# Patient Record
Sex: Female | Born: 1945
Health system: Southern US, Community
[De-identification: ages and names within clinical notes are randomized; demographics above are authoritative.]

## PROBLEM LIST (undated history)

## (undated) DIAGNOSIS — R51 Headache: Secondary | ICD-10-CM

## (undated) DIAGNOSIS — Z860100 Personal history of colon polyps, unspecified: Secondary | ICD-10-CM

## (undated) DIAGNOSIS — Z87898 Personal history of other specified conditions: Secondary | ICD-10-CM

## (undated) DIAGNOSIS — Z8709 Personal history of other diseases of the respiratory system: Secondary | ICD-10-CM

## (undated) DIAGNOSIS — M329 Systemic lupus erythematosus, unspecified: Secondary | ICD-10-CM

## (undated) DIAGNOSIS — R918 Other nonspecific abnormal finding of lung field: Secondary | ICD-10-CM

## (undated) DIAGNOSIS — M1990 Unspecified osteoarthritis, unspecified site: Secondary | ICD-10-CM

## (undated) DIAGNOSIS — C801 Malignant (primary) neoplasm, unspecified: Secondary | ICD-10-CM

## (undated) DIAGNOSIS — I10 Essential (primary) hypertension: Secondary | ICD-10-CM

## (undated) DIAGNOSIS — F419 Anxiety disorder, unspecified: Secondary | ICD-10-CM

## (undated) DIAGNOSIS — J302 Other seasonal allergic rhinitis: Secondary | ICD-10-CM

## (undated) DIAGNOSIS — Z9289 Personal history of other medical treatment: Secondary | ICD-10-CM

## (undated) DIAGNOSIS — K274 Chronic or unspecified peptic ulcer, site unspecified, with hemorrhage: Secondary | ICD-10-CM

## (undated) DIAGNOSIS — M797 Fibromyalgia: Secondary | ICD-10-CM

## (undated) DIAGNOSIS — Z87442 Personal history of urinary calculi: Secondary | ICD-10-CM

## (undated) DIAGNOSIS — IMO0002 Reserved for concepts with insufficient information to code with codable children: Secondary | ICD-10-CM

## (undated) DIAGNOSIS — S069X9A Unspecified intracranial injury with loss of consciousness of unspecified duration, initial encounter: Secondary | ICD-10-CM

## (undated) DIAGNOSIS — F329 Major depressive disorder, single episode, unspecified: Secondary | ICD-10-CM

## (undated) DIAGNOSIS — I2699 Other pulmonary embolism without acute cor pulmonale: Secondary | ICD-10-CM

## (undated) DIAGNOSIS — J189 Pneumonia, unspecified organism: Secondary | ICD-10-CM

## (undated) DIAGNOSIS — M2550 Pain in unspecified joint: Secondary | ICD-10-CM

## (undated) DIAGNOSIS — E785 Hyperlipidemia, unspecified: Secondary | ICD-10-CM

## (undated) DIAGNOSIS — F32A Depression, unspecified: Secondary | ICD-10-CM

## (undated) DIAGNOSIS — R42 Dizziness and giddiness: Secondary | ICD-10-CM

## (undated) DIAGNOSIS — J449 Chronic obstructive pulmonary disease, unspecified: Secondary | ICD-10-CM

## (undated) DIAGNOSIS — F4310 Post-traumatic stress disorder, unspecified: Secondary | ICD-10-CM

## (undated) DIAGNOSIS — C50919 Malignant neoplasm of unspecified site of unspecified female breast: Secondary | ICD-10-CM

## (undated) HISTORY — PX: POLYPECTOMY: SHX149

## (undated) HISTORY — PX: CYSTOSCOPY: SUR368

## (undated) HISTORY — PX: KNEE ARTHROSCOPY: SUR90

## (undated) HISTORY — PX: OTHER SURGICAL HISTORY: SHX169

## (undated) HISTORY — PX: BREAST LUMPECTOMY: SHX2

## (undated) HISTORY — PX: BREAST SURGERY: SHX581

## (undated) HISTORY — PX: INCISIONAL HERNIA REPAIR: SHX193

## (undated) HISTORY — PX: BACK SURGERY: SHX140

## (undated) HISTORY — PX: JOINT REPLACEMENT: SHX530

## (undated) HISTORY — PX: BREAST EXCISIONAL BIOPSY: SUR124

## (undated) HISTORY — PX: COLONOSCOPY: SHX5424

## (undated) HISTORY — PX: HERNIA REPAIR: SHX51

## (undated) HISTORY — PX: COLONOSCOPY: SHX174

## (undated) HISTORY — PX: DILATION AND CURETTAGE OF UTERUS: SHX78

---
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
## 1969-02-05 HISTORY — PX: URETHRAL DILATION: SUR417

## 1972-02-06 DIAGNOSIS — K284 Chronic or unspecified gastrojejunal ulcer with hemorrhage: Secondary | ICD-10-CM

## 1972-02-06 DIAGNOSIS — K274 Chronic or unspecified peptic ulcer, site unspecified, with hemorrhage: Secondary | ICD-10-CM

## 1972-02-06 HISTORY — DX: Chronic or unspecified peptic ulcer, site unspecified, with hemorrhage: K27.4

## 1972-02-06 HISTORY — DX: Chronic or unspecified gastrojejunal ulcer with hemorrhage: K28.4

## 1988-02-06 HISTORY — PX: MULTIPLE TOOTH EXTRACTIONS: SHX2053

## 1990-02-05 HISTORY — PX: ABDOMINAL HYSTERECTOMY: SHX81

## 1990-02-05 HISTORY — PX: CARDIAC CATHETERIZATION: SHX172

## 1992-02-06 HISTORY — PX: CHOLECYSTECTOMY: SHX55

## 2004-02-06 DIAGNOSIS — I2699 Other pulmonary embolism without acute cor pulmonale: Secondary | ICD-10-CM

## 2004-02-06 HISTORY — DX: Other pulmonary embolism without acute cor pulmonale: I26.99

## 2004-02-06 HISTORY — PX: VENA CAVA FILTER PLACEMENT: SUR1032

## 2011-05-07 DIAGNOSIS — D3A090 Benign carcinoid tumor of the bronchus and lung: Secondary | ICD-10-CM | POA: Insufficient documentation

## 2011-05-07 DIAGNOSIS — R918 Other nonspecific abnormal finding of lung field: Secondary | ICD-10-CM

## 2011-05-07 HISTORY — DX: Other nonspecific abnormal finding of lung field: R91.8

## 2011-05-18 ENCOUNTER — Encounter (HOSPITAL_COMMUNITY): Payer: Self-pay

## 2011-05-28 ENCOUNTER — Encounter (HOSPITAL_COMMUNITY): Payer: Self-pay

## 2011-05-28 ENCOUNTER — Encounter (HOSPITAL_COMMUNITY)
Admission: RE | Admit: 2011-05-28 | Discharge: 2011-05-28 | Disposition: A | Discharge status: Home / Self Care | Payer: Medicare Other | Source: Ambulatory Visit | Attending: Orthopedic Surgery | Admitting: Orthopedic Surgery

## 2011-05-28 ENCOUNTER — Encounter (HOSPITAL_COMMUNITY)
Admission: RE | Admit: 2011-05-28 | Discharge: 2011-05-28 | Disposition: A | Discharge status: Home / Self Care | Payer: Medicare Other | Source: Ambulatory Visit | Attending: Anesthesiology | Admitting: Anesthesiology

## 2011-05-28 ENCOUNTER — Other Ambulatory Visit: Payer: Self-pay | Admitting: Orthopedic Surgery

## 2011-05-28 HISTORY — DX: Fibromyalgia: M79.7

## 2011-05-28 HISTORY — DX: Headache: R51

## 2011-05-28 HISTORY — DX: Essential (primary) hypertension: I10

## 2011-05-28 HISTORY — DX: Pneumonia, unspecified organism: J18.9

## 2011-05-28 HISTORY — DX: Other seasonal allergic rhinitis: J30.2

## 2011-05-28 HISTORY — DX: Other pulmonary embolism without acute cor pulmonale: I26.99

## 2011-05-28 HISTORY — DX: Malignant (primary) neoplasm, unspecified: C80.1

## 2011-05-28 LAB — APTT: aPTT: 30 seconds (ref 24–37)

## 2011-05-28 LAB — BASIC METABOLIC PANEL
Calcium: 9.5 mg/dL (ref 8.4–10.5)
Creatinine, Ser: 0.5 mg/dL (ref 0.50–1.10)
GFR calc Af Amer: >90 mL/min (ref >90)
GFR calc non Af Amer: >90 mL/min (ref >90)
Sodium: 138 mEq/L (ref 135–145)

## 2011-05-28 LAB — CBC
Platelets: 279 10*3/uL (ref 150–400)
RBC: 4.82 MIL/uL (ref 3.87–5.11)
RDW: 12.7 % (ref 11.5–15.5)
WBC: 10.5 10*3/uL (ref 4.0–10.5)

## 2011-05-28 LAB — TYPE AND SCREEN
ABO/RH(D): O POS
Antibody Screen: NEGATIVE

## 2011-05-28 LAB — PROTIME-INR: Prothrombin Time: 13.2 seconds (ref 11.6–15.2)

## 2011-05-28 NOTE — Pre-Procedure Instructions (Addendum)
20 Breanna Dixon  05/28/2011   Your procedure is scheduled on:  Mon, April 29 @ 1000 am  Report to [facility] at 0800 AM.  Call this number if you have problems the morning of surgery: [phone]   Remember:   Do not eat food:After Midnight.  May have clear liquids: up to 4 Hours before arrival.(until 4:00 am)  Clear liquids include soda, tea, black coffee, apple or grape juice, broth,water  Take these medicines the morning of surgery with A SIP OF WATER: Cymbalta,Pain Pill(if needed),Ativan,Metoprolol,Lyrica,Phenergan(if needed)   Do not wear jewelry, make-up or nail polish.  Do not wear lotions, powders, or perfumes.   Do not shave 48 hours prior to surgery.  Do not bring valuables to the hospital.  Contacts, dentures or bridgework may not be worn into surgery.  Leave suitcase in the car. After surgery it may be brought to your room.  For patients admitted to the hospital, checkout time is 11:00 AM the day of discharge.   Special Instructions: CHG Shower Use Special Wash: 1/2 bottle night before surgery and 1/2 bottle morning of surgery.   Please read over the following fact sheets that you were given: Pain Booklet, Coughing and Deep Breathing, Blood Transfusion Information, Total Joint Packet, MRSA Information and Surgical Site Infection Prevention

## 2011-05-28 NOTE — Progress Notes (Signed)
Patient had stress test and echo in april 2013 with [facility] cardiology - request for records sent. Medical doctor - Dr. Letta Kocher

## 2011-05-29 ENCOUNTER — Inpatient Hospital Stay: Admission: RE | Admit: 2011-05-29 | Payer: Medicare Other | Source: Ambulatory Visit

## 2011-05-29 ENCOUNTER — Other Ambulatory Visit: Payer: Self-pay | Admitting: Orthopedic Surgery

## 2011-05-29 DIAGNOSIS — R911 Solitary pulmonary nodule: Secondary | ICD-10-CM

## 2011-05-29 NOTE — Consult Note (Addendum)
Anesthesia Chart Review:  Patient is a 66 year old female posted for a right TKA on 06/04/11.  (Orders are still pending.) History includes smoking, HTN, gastric ulcer, fibromyalgia, headaches, tachycardia, breast cancer, PTSD, PE '06.  Anesthesia history reports she woke up during a previous surgery.  Currently I have no further details, and there is no notation that Anesthesia was asked to evaluate patient during her PAT visit.  She was recently evaluated by Cardiologist Dr. Tereso Newcomer with [facility] on 03/28/11 for chest pain and preoperative evaluation.  Nuclear stress test on 04/16/11 showed no evidence of ischemia, normal LV function with EF 89%.    Echo on 04/16/11 showed normal LV systolic function, EF 75%, mild MR/TR with normal pulmonary pressures, mild-moderate AR.    EKG on 05/28/11 showed NSR.  CXR on 05/28/11 showed 1. Question right lower lobe pulmonary nodule. Recommend CT chest  with contrast for further evaluation to exclude neoplasm.  Labs were done per Anesthesia, since orders were not available from the surgeon during patient's PAT visit.    I called her CXR results to Keri at Dr. [name] office.  Dr. Sherlean Foot plans to order a chest CT pre-operatively.  She is also aware any additional labs ordered by the surgeon will be done on arrival.  I have left a message with Ms. [place] to call me to discuss her documented anesthesia complication(s).  If I do not hear back from her, this will have to be discussed further on the day of surgery.    Addendum: 05/30/11 1645  I spoke with patient earlier today.  She reports that she had retained stones following her lap chole.  She then underwent what sounds like an ERCP for stone removal (that was not done under GA) and she "woke up" during this procedure and had to be given additional medication.  She does remember once being aware of the ETT being removed right after surgery, but otherwise has not had any known  complication.  She did under a chest CT scan today for an abnormal CXR.  This showed: "17 x 14 x 16 mm well marginated mass in the right lower lobe centrally. This could represent a lung carcinoma. However, well marginated character raises the possibility of hamartoma, a venous varix or bronchial atresia. Are there old studies at any other location? If this can be defined as chronic, it could be followed. If no prior studies exist, this abnormality is large enough that PET scan should be reliable for evaluation. Evidence of previous granulomatous infection in the left lower lobe with calcified lymph nodes in the left hilum, mediastinum and in the celiac axis region. This is obviously old and inactive disease."  When I was last updated by Keri at Dr. [name] office, he was planning to discuss these findings with a Pulmonologist to determine if further work-up should be pursued preoperatively and then proceed accordingly.   Addendum: 06/01/11 1500  Ms. [place] was seen by Dr. Erenest Blank of [facility] on 06/01/11.  He favors that her lung lesion is non-malignant, but regardless will need further evaluation.  He feels this could be done pre- or post- operatively depending on patient or surgeon preference.  He did go ahead and order a PET scan.  Spirometry today at his office showed mild reduction.  He feels she is at mild increased risk for perioperative respiratory problems due to her COPD, but not prohibitive.  (See attached office note for complete details.)  Shonna Chock, PA-C

## 2011-05-30 ENCOUNTER — Ambulatory Visit
Admission: RE | Admit: 2011-05-30 | Discharge: 2011-05-30 | Disposition: A | Discharge status: Home / Self Care | Payer: Medicare Other | Source: Ambulatory Visit | Attending: Orthopedic Surgery | Admitting: Orthopedic Surgery

## 2011-05-30 DIAGNOSIS — R911 Solitary pulmonary nodule: Secondary | ICD-10-CM

## 2011-05-31 ENCOUNTER — Other Ambulatory Visit: Payer: Self-pay | Admitting: Orthopedic Surgery

## 2011-05-31 ENCOUNTER — Inpatient Hospital Stay: Admission: RE | Admit: 2011-05-31 | Payer: Medicare Other | Source: Ambulatory Visit

## 2011-05-31 NOTE — Progress Notes (Signed)
REQUESTED  ORDERS DR LUCEY.

## 2011-05-31 NOTE — Progress Notes (Signed)
1425.[name]..Marland KitchenORDERS NOW IN CHART FROM [place], [place]  FOR  DR.  Sherlean Foot.[name]

## 2011-06-03 MED ORDER — CHLORHEXIDINE GLUCONATE 4 % EX LIQD: 60.0000 mL | Freq: Once | CUTANEOUS | Status: DC

## 2011-06-03 MED ORDER — CEFAZOLIN SODIUM-DEXTROSE 2-3 GM-% IV SOLR
2.0000 g | INTRAVENOUS | Status: AC
  Administered 2011-06-04: 2 g via INTRAVENOUS
  Filled 2011-06-03: qty 50

## 2011-06-04 ENCOUNTER — Ambulatory Visit (HOSPITAL_COMMUNITY): Payer: Medicare Other | Admitting: Vascular Surgery

## 2011-06-04 ENCOUNTER — Encounter (HOSPITAL_COMMUNITY): Payer: Self-pay | Admitting: Vascular Surgery

## 2011-06-04 ENCOUNTER — Encounter (HOSPITAL_COMMUNITY): Payer: Self-pay | Admitting: Surgery

## 2011-06-04 ENCOUNTER — Inpatient Hospital Stay (HOSPITAL_COMMUNITY)
Admission: RE | Admit: 2011-06-04 | Discharge: 2011-06-07 | DRG: 470 | Disposition: A | Discharge status: Home Health | Payer: Medicare Other | Source: Ambulatory Visit | Attending: Orthopedic Surgery | Admitting: Orthopedic Surgery

## 2011-06-04 ENCOUNTER — Encounter (HOSPITAL_COMMUNITY)
Admission: RE | Disposition: A | Discharge status: Home Health | Payer: Self-pay | Source: Ambulatory Visit | Attending: Orthopedic Surgery

## 2011-06-04 DIAGNOSIS — Z01812 Encounter for preprocedural laboratory examination: Secondary | ICD-10-CM

## 2011-06-04 DIAGNOSIS — M1710 Unilateral primary osteoarthritis, unspecified knee: Principal | ICD-10-CM | POA: Diagnosis present

## 2011-06-04 DIAGNOSIS — M1711 Unilateral primary osteoarthritis, right knee: Secondary | ICD-10-CM

## 2011-06-04 DIAGNOSIS — D62 Acute posthemorrhagic anemia: Secondary | ICD-10-CM | POA: Diagnosis not present

## 2011-06-04 DIAGNOSIS — F17200 Nicotine dependence, unspecified, uncomplicated: Secondary | ICD-10-CM | POA: Diagnosis present

## 2011-06-04 DIAGNOSIS — I10 Essential (primary) hypertension: Secondary | ICD-10-CM | POA: Diagnosis present

## 2011-06-04 HISTORY — PX: TOTAL KNEE ARTHROPLASTY: SHX125

## 2011-06-04 HISTORY — DX: Other nonspecific abnormal finding of lung field: R91.8

## 2011-06-04 SURGERY — ARTHROPLASTY, KNEE, TOTAL
Anesthesia: General | Site: Knee | Laterality: Right | Wound class: Clean

## 2011-06-04 MED ORDER — FENTANYL CITRATE 0.05 MG/ML IJ SOLN
INTRAMUSCULAR | Status: DC | PRN
Start: 2011-06-04 — End: 2011-06-04
  Administered 2011-06-04: 150 ug via INTRAVENOUS

## 2011-06-04 MED ORDER — ENOXAPARIN SODIUM 30 MG/0.3ML ~~LOC~~ SOLN
30.0000 mg | Freq: Two times a day (BID) | SUBCUTANEOUS | Status: DC
  Administered 2011-06-05 – 2011-06-07 (×5): 30 mg via SUBCUTANEOUS
  Filled 2011-06-04 (×7): qty 0.3

## 2011-06-04 MED ORDER — SODIUM CHLORIDE 0.9 % IV SOLN
INTRAVENOUS | Status: DC
  Administered 2011-06-05: 09:00:00 via INTRAVENOUS

## 2011-06-04 MED ORDER — ALUM & MAG HYDROXIDE-SIMETH 200-200-20 MG/5ML PO SUSP: 30.0000 mL | ORAL | Status: DC | PRN

## 2011-06-04 MED ORDER — BUPIVACAINE 0.25 % ON-Q PUMP SINGLE CATH 300ML
INJECTION | Status: DC | PRN
  Administered 2011-06-04: 300 mL

## 2011-06-04 MED ORDER — ACETAMINOPHEN 10 MG/ML IV SOLN
1000.0000 mg | Freq: Four times a day (QID) | INTRAVENOUS | Status: DC
  Administered 2011-06-04: 1000 mg via INTRAVENOUS
  Filled 2011-06-04 (×3): qty 100

## 2011-06-04 MED ORDER — MENTHOL 3 MG MT LOZG: 1.0000 | LOZENGE | OROMUCOSAL | Status: DC | PRN

## 2011-06-04 MED ORDER — FENTANYL CITRATE 0.05 MG/ML IJ SOLN
INTRAMUSCULAR | Status: AC
  Filled 2011-06-04: qty 2

## 2011-06-04 MED ORDER — FLEET ENEMA 7-19 GM/118ML RE ENEM: 1.0000 | ENEMA | Freq: Once | RECTAL | Status: AC | PRN

## 2011-06-04 MED ORDER — ONDANSETRON HCL 4 MG/2ML IJ SOLN
INTRAMUSCULAR | Status: DC | PRN
  Administered 2011-06-04: 4 mg via INTRAVENOUS

## 2011-06-04 MED ORDER — CARBAMAZEPINE ER 200 MG PO TB12
200.0000 mg | ORAL_TABLET | Freq: Every day | ORAL | Status: DC
  Administered 2011-06-05 – 2011-06-07 (×3): 200 mg via ORAL
  Filled 2011-06-04 (×3): qty 1

## 2011-06-04 MED ORDER — FLUTICASONE PROPIONATE 50 MCG/ACT NA SUSP
2.0000 | Freq: Every day | NASAL | Status: DC
  Administered 2011-06-05 – 2011-06-07 (×3): 2 via NASAL
  Filled 2011-06-04: qty 16

## 2011-06-04 MED ORDER — DIPHENHYDRAMINE HCL 12.5 MG/5ML PO ELIX: 12.5000 mg | ORAL_SOLUTION | ORAL | Status: DC | PRN

## 2011-06-04 MED ORDER — BUPIVACAINE-EPINEPHRINE PF 0.5-1:200000 % IJ SOLN
INTRAMUSCULAR | Status: DC | PRN
  Administered 2011-06-04: 30 mL

## 2011-06-04 MED ORDER — METHOCARBAMOL 100 MG/ML IJ SOLN
500.0000 mg | Freq: Four times a day (QID) | INTRAVENOUS | Status: DC | PRN
  Filled 2011-06-04 (×2): qty 5

## 2011-06-04 MED ORDER — GLYCOPYRROLATE 0.2 MG/ML IJ SOLN
INTRAMUSCULAR | Status: DC | PRN
  Administered 2011-06-04: .6 mg via INTRAVENOUS

## 2011-06-04 MED ORDER — HYDROMORPHONE HCL PF 1 MG/ML IJ SOLN
0.5000 mg | INTRAMUSCULAR | Status: DC | PRN
  Administered 2011-06-04 – 2011-06-06 (×7): 1 mg via INTRAVENOUS
  Filled 2011-06-04 (×6): qty 1

## 2011-06-04 MED ORDER — MIDAZOLAM HCL 2 MG/2ML IJ SOLN
INTRAMUSCULAR | Status: AC
  Filled 2011-06-04: qty 2

## 2011-06-04 MED ORDER — BUPIVACAINE ON-Q PAIN PUMP (FOR ORDER SET NO CHG)
INJECTION | Status: AC
  Filled 2011-06-04: qty 1

## 2011-06-04 MED ORDER — HYDROMORPHONE HCL PF 1 MG/ML IJ SOLN
INTRAMUSCULAR | Status: AC
  Filled 2011-06-04: qty 1

## 2011-06-04 MED ORDER — VERAPAMIL HCL ER 240 MG PO TBCR
240.0000 mg | EXTENDED_RELEASE_TABLET | Freq: Every day | ORAL | Status: DC
  Administered 2011-06-04 – 2011-06-06 (×3): 240 mg via ORAL
  Filled 2011-06-04 (×4): qty 1

## 2011-06-04 MED ORDER — CELECOXIB 200 MG PO CAPS
200.0000 mg | ORAL_CAPSULE | Freq: Two times a day (BID) | ORAL | Status: DC
  Administered 2011-06-04 – 2011-06-07 (×6): 200 mg via ORAL
  Filled 2011-06-04 (×7): qty 1

## 2011-06-04 MED ORDER — ROCURONIUM BROMIDE 100 MG/10ML IV SOLN
INTRAVENOUS | Status: DC | PRN
  Administered 2011-06-04: 50 mg via INTRAVENOUS

## 2011-06-04 MED ORDER — IPRATROPIUM-ALBUTEROL 18-103 MCG/ACT IN AERO: 2.0000 | INHALATION_SPRAY | Freq: Four times a day (QID) | RESPIRATORY_TRACT | Status: DC | PRN

## 2011-06-04 MED ORDER — PREGABALIN 75 MG PO CAPS
75.0000 mg | ORAL_CAPSULE | Freq: Two times a day (BID) | ORAL | Status: DC
  Administered 2011-06-04 – 2011-06-07 (×6): 75 mg via ORAL
  Filled 2011-06-04 (×6): qty 1

## 2011-06-04 MED ORDER — METOCLOPRAMIDE HCL 10 MG PO TABS: 5.0000 mg | ORAL_TABLET | Freq: Three times a day (TID) | ORAL | Status: DC | PRN

## 2011-06-04 MED ORDER — METHOCARBAMOL 500 MG PO TABS
500.0000 mg | ORAL_TABLET | Freq: Four times a day (QID) | ORAL | Status: DC | PRN
  Administered 2011-06-04 – 2011-06-06 (×5): 500 mg via ORAL
  Filled 2011-06-04 (×6): qty 1

## 2011-06-04 MED ORDER — ACETAMINOPHEN 325 MG PO TABS: 650.0000 mg | ORAL_TABLET | Freq: Four times a day (QID) | ORAL | Status: DC | PRN

## 2011-06-04 MED ORDER — MIDAZOLAM HCL 2 MG/2ML IJ SOLN
2.0000 mg | INTRAMUSCULAR | Status: DC | PRN
  Administered 2011-06-04: 2 mg via INTRAVENOUS

## 2011-06-04 MED ORDER — METOCLOPRAMIDE HCL 5 MG/ML IJ SOLN: 5.0000 mg | Freq: Three times a day (TID) | INTRAMUSCULAR | Status: DC | PRN

## 2011-06-04 MED ORDER — PHENOL 1.4 % MT LIQD: 1.0000 | OROMUCOSAL | Status: DC | PRN

## 2011-06-04 MED ORDER — OXYCODONE HCL 10 MG PO TB12
10.0000 mg | ORAL_TABLET | Freq: Two times a day (BID) | ORAL | Status: DC
  Administered 2011-06-04 (×2): 10 mg via ORAL
  Filled 2011-06-04 (×2): qty 1

## 2011-06-04 MED ORDER — CARBAMAZEPINE ER 400 MG PO TB12
400.0000 mg | ORAL_TABLET | Freq: Every day | ORAL | Status: DC
  Administered 2011-06-04 – 2011-06-06 (×3): 400 mg via ORAL
  Filled 2011-06-04 (×4): qty 1

## 2011-06-04 MED ORDER — VERAPAMIL HCL 240 MG (CO) PO TB24: 240.0000 mg | ORAL_TABLET | Freq: Every day | ORAL | Status: DC

## 2011-06-04 MED ORDER — BUPIVACAINE 0.25 % ON-Q PUMP SINGLE CATH 300ML
300.0000 mL | INJECTION | Status: DC
  Filled 2011-06-04: qty 300

## 2011-06-04 MED ORDER — DOCUSATE SODIUM 100 MG PO CAPS
100.0000 mg | ORAL_CAPSULE | Freq: Two times a day (BID) | ORAL | Status: DC
  Administered 2011-06-04 – 2011-06-07 (×7): 100 mg via ORAL
  Filled 2011-06-04 (×8): qty 1

## 2011-06-04 MED ORDER — SODIUM CHLORIDE 0.9 % IR SOLN
Status: DC | PRN
  Administered 2011-06-04: 3000 mL

## 2011-06-04 MED ORDER — BUPIVACAINE-EPINEPHRINE 0.25% -1:200000 IJ SOLN
INTRAMUSCULAR | Status: DC | PRN
  Administered 2011-06-04: 20 mL

## 2011-06-04 MED ORDER — ACETAMINOPHEN 650 MG RE SUPP: 650.0000 mg | Freq: Four times a day (QID) | RECTAL | Status: DC | PRN

## 2011-06-04 MED ORDER — BISACODYL 5 MG PO TBEC
5.0000 mg | DELAYED_RELEASE_TABLET | Freq: Every day | ORAL | Status: DC | PRN
  Administered 2011-06-07: 5 mg via ORAL
  Filled 2011-06-04: qty 1

## 2011-06-04 MED ORDER — ACETAMINOPHEN 10 MG/ML IV SOLN
1000.0000 mg | Freq: Once | INTRAVENOUS | Status: DC
  Filled 2011-06-04: qty 100

## 2011-06-04 MED ORDER — DULOXETINE HCL 60 MG PO CPEP
90.0000 mg | ORAL_CAPSULE | Freq: Every day | ORAL | Status: DC
  Administered 2011-06-05 – 2011-06-07 (×3): 90 mg via ORAL
  Filled 2011-06-04 (×4): qty 1

## 2011-06-04 MED ORDER — CARBAMAZEPINE ER 200 MG PO TB12: 200.0000 mg | ORAL_TABLET | Freq: Two times a day (BID) | ORAL | Status: DC

## 2011-06-04 MED ORDER — FENTANYL CITRATE 0.05 MG/ML IJ SOLN
100.0000 ug | INTRAMUSCULAR | Status: DC | PRN
  Administered 2011-06-04: 100 ug via INTRAVENOUS

## 2011-06-04 MED ORDER — METOPROLOL SUCCINATE ER 100 MG PO TB24
200.0000 mg | ORAL_TABLET | ORAL | Status: DC
  Administered 2011-06-05 – 2011-06-07 (×3): 200 mg via ORAL
  Filled 2011-06-04 (×4): qty 2

## 2011-06-04 MED ORDER — NEOSTIGMINE METHYLSULFATE 1 MG/ML IJ SOLN
INTRAMUSCULAR | Status: DC | PRN
  Administered 2011-06-04: 4 mg via INTRAVENOUS

## 2011-06-04 MED ORDER — FENOFIBRATE 54 MG PO TABS
54.0000 mg | ORAL_TABLET | Freq: Every day | ORAL | Status: DC
  Administered 2011-06-05 – 2011-06-07 (×3): 54 mg via ORAL
  Filled 2011-06-04 (×4): qty 1

## 2011-06-04 MED ORDER — LORAZEPAM 0.5 MG PO TABS
0.5000 mg | ORAL_TABLET | ORAL | Status: DC | PRN
  Administered 2011-06-04 – 2011-06-05 (×2): 0.5 mg via ORAL
  Filled 2011-06-04: qty 1
  Filled 2011-06-04: qty 2
  Filled 2011-06-04: qty 1

## 2011-06-04 MED ORDER — ONDANSETRON HCL 4 MG/2ML IJ SOLN
4.0000 mg | Freq: Four times a day (QID) | INTRAMUSCULAR | Status: DC | PRN
  Administered 2011-06-05: 4 mg via INTRAVENOUS
  Filled 2011-06-04: qty 2

## 2011-06-04 MED ORDER — ONDANSETRON HCL 4 MG PO TABS
4.0000 mg | ORAL_TABLET | Freq: Four times a day (QID) | ORAL | Status: DC | PRN
  Administered 2011-06-05 – 2011-06-07 (×3): 4 mg via ORAL
  Filled 2011-06-04 (×3): qty 1

## 2011-06-04 MED ORDER — SODIUM CHLORIDE 0.9 % IV SOLN: INTRAVENOUS | Status: DC

## 2011-06-04 MED ORDER — ZOLPIDEM TARTRATE 5 MG PO TABS
5.0000 mg | ORAL_TABLET | Freq: Every evening | ORAL | Status: DC | PRN
  Administered 2011-06-06: 5 mg via ORAL
  Filled 2011-06-04: qty 1

## 2011-06-04 MED ORDER — HYDROMORPHONE HCL PF 1 MG/ML IJ SOLN
0.2500 mg | INTRAMUSCULAR | Status: DC | PRN
  Administered 2011-06-04 (×4): 0.5 mg via INTRAVENOUS

## 2011-06-04 MED ORDER — LIDOCAINE HCL (CARDIAC) 20 MG/ML IV SOLN
INTRAVENOUS | Status: DC | PRN
  Administered 2011-06-04: 60 mg via INTRAVENOUS

## 2011-06-04 MED ORDER — CEFAZOLIN SODIUM 1-5 GM-% IV SOLN
1.0000 g | Freq: Four times a day (QID) | INTRAVENOUS | Status: AC
  Administered 2011-06-04 – 2011-06-05 (×3): 1 g via INTRAVENOUS
  Filled 2011-06-04 (×3): qty 50

## 2011-06-04 MED ORDER — PROPOFOL 10 MG/ML IV EMUL
INTRAVENOUS | Status: DC | PRN
  Administered 2011-06-04: 130 mg via INTRAVENOUS

## 2011-06-04 MED ORDER — LACTATED RINGERS IV SOLN
INTRAVENOUS | Status: DC | PRN
  Administered 2011-06-04 (×2): via INTRAVENOUS

## 2011-06-04 MED ORDER — LACTATED RINGERS IV SOLN
INTRAVENOUS | Status: DC
  Administered 2011-06-04: 10:00:00 via INTRAVENOUS

## 2011-06-04 MED ORDER — OXYCODONE HCL 5 MG PO TABS
5.0000 mg | ORAL_TABLET | ORAL | Status: DC | PRN
  Administered 2011-06-04: 5 mg via ORAL
  Administered 2011-06-04 – 2011-06-05 (×6): 10 mg via ORAL
  Administered 2011-06-05: 5 mg via ORAL
  Administered 2011-06-06 (×3): 10 mg via ORAL
  Filled 2011-06-04: qty 1
  Filled 2011-06-04 (×7): qty 2
  Filled 2011-06-04: qty 1
  Filled 2011-06-04 (×2): qty 2

## 2011-06-04 MED ORDER — SENNOSIDES-DOCUSATE SODIUM 8.6-50 MG PO TABS
1.0000 | ORAL_TABLET | Freq: Every evening | ORAL | Status: DC | PRN
  Administered 2011-06-06: 1 via ORAL
  Filled 2011-06-04: qty 1

## 2011-06-04 SURGICAL SUPPLY — 58 items
BANDAGE ESMARK 6X9 LF (GAUZE/BANDAGES/DRESSINGS) ×1 IMPLANT
BLADE SAGITTAL 13X1.27X60 (BLADE) ×2 IMPLANT
BLADE SAW SGTL 83.5X18.5 (BLADE) ×2 IMPLANT
BNDG ESMARK 6X9 LF (GAUZE/BANDAGES/DRESSINGS) ×2
BOWL SMART MIX CTS (DISPOSABLE) ×2 IMPLANT
CATH KIT ON Q 5IN SLV (PAIN MANAGEMENT) ×2 IMPLANT
CEMENT BONE SIMPLEX SPEEDSET (Cement) ×2 IMPLANT
CLOTH BEACON ORANGE TIMEOUT ST (SAFETY) ×4 IMPLANT
COVER BACK TABLE 24X17X13 BIG (DRAPES) ×2 IMPLANT
COVER SURGICAL LIGHT HANDLE (MISCELLANEOUS) ×2 IMPLANT
CUFF TOURNIQUET SINGLE 34IN LL (TOURNIQUET CUFF) ×2 IMPLANT
DRAPE EXTREMITY T 121X128X90 (DRAPE) ×2 IMPLANT
DRAPE INCISE IOBAN 66X45 STRL (DRAPES) ×4 IMPLANT
DRAPE PROXIMA HALF (DRAPES) ×2 IMPLANT
DRAPE U-SHAPE 47X51 STRL (DRAPES) ×2 IMPLANT
DRSG ADAPTIC 3X8 NADH LF (GAUZE/BANDAGES/DRESSINGS) ×2 IMPLANT
DRSG PAD ABDOMINAL 8X10 ST (GAUZE/BANDAGES/DRESSINGS) ×2 IMPLANT
DURAPREP 26ML APPLICATOR (WOUND CARE) ×4 IMPLANT
ELECT REM PT RETURN 9FT ADLT (ELECTROSURGICAL) ×2
ELECTRODE REM PT RTRN 9FT ADLT (ELECTROSURGICAL) ×1 IMPLANT
EVACUATOR 1/8 PVC DRAIN (DRAIN) ×2 IMPLANT
GLOVE BIOGEL M 7.0 STRL (GLOVE) IMPLANT
GLOVE BIOGEL PI IND STRL 7.5 (GLOVE) ×1 IMPLANT
GLOVE BIOGEL PI IND STRL 8.5 (GLOVE) ×2 IMPLANT
GLOVE BIOGEL PI INDICATOR 7.5 (GLOVE) ×1
GLOVE BIOGEL PI INDICATOR 8.5 (GLOVE) ×2
GLOVE SURG ORTHO 8.0 STRL STRW (GLOVE) ×4 IMPLANT
GOWN PREVENTION PLUS XLARGE (GOWN DISPOSABLE) ×4 IMPLANT
GOWN STRL NON-REIN LRG LVL3 (GOWN DISPOSABLE) ×4 IMPLANT
HANDPIECE INTERPULSE COAX TIP (DISPOSABLE) ×1
HOOD PEEL AWAY FACE SHEILD DIS (HOOD) ×8 IMPLANT
KIT BASIN OR (CUSTOM PROCEDURE TRAY) ×2 IMPLANT
KIT ROOM TURNOVER OR (KITS) ×2 IMPLANT
MANIFOLD NEPTUNE II (INSTRUMENTS) ×2 IMPLANT
NEEDLE 22X1 1/2 (OR ONLY) (NEEDLE) IMPLANT
NS IRRIG 1000ML POUR BTL (IV SOLUTION) ×2 IMPLANT
PACK TOTAL JOINT (CUSTOM PROCEDURE TRAY) ×2 IMPLANT
PAD ARMBOARD 7.5X6 YLW CONV (MISCELLANEOUS) ×4 IMPLANT
PAD CAST 4YDX4 CTTN HI CHSV (CAST SUPPLIES) ×1 IMPLANT
PADDING CAST COTTON 4X4 STRL (CAST SUPPLIES) ×1
PADDING CAST COTTON 6X4 STRL (CAST SUPPLIES) ×2 IMPLANT
POSITIONER HEAD PRONE TRACH (MISCELLANEOUS) ×2 IMPLANT
SET HNDPC FAN SPRY TIP SCT (DISPOSABLE) ×1 IMPLANT
SPONGE GAUZE 4X4 12PLY (GAUZE/BANDAGES/DRESSINGS) ×2 IMPLANT
STAPLER VISISTAT 35W (STAPLE) ×2 IMPLANT
SUCTION FRAZIER TIP 10 FR DISP (SUCTIONS) ×2 IMPLANT
SUT BONE WAX W31G (SUTURE) ×2 IMPLANT
SUT VIC AB 0 CTB1 27 (SUTURE) ×4 IMPLANT
SUT VIC AB 1 CT1 27 (SUTURE) ×1
SUT VIC AB 1 CT1 27XBRD ANBCTR (SUTURE) ×1 IMPLANT
SUT VIC AB 2-0 CT1 27 (SUTURE) ×2
SUT VIC AB 2-0 CT1 TAPERPNT 27 (SUTURE) ×2 IMPLANT
SUT VLOC 180 0 24IN GS25 (SUTURE) ×2 IMPLANT
SYR CONTROL 10ML LL (SYRINGE) IMPLANT
TOWEL OR 17X24 6PK STRL BLUE (TOWEL DISPOSABLE) ×2 IMPLANT
TOWEL OR 17X26 10 PK STRL BLUE (TOWEL DISPOSABLE) ×2 IMPLANT
TRAY FOLEY CATH 14FR (SET/KITS/TRAYS/PACK) ×2 IMPLANT
WATER STERILE IRR 1000ML POUR (IV SOLUTION) ×6 IMPLANT

## 2011-06-04 NOTE — Progress Notes (Signed)
Orthopedic Tech Progress Note Patient Details:  Breanna Dixon 03-25-1945 [id number]  CPM Right Knee CPM Right Knee: On Right Knee Flexion (Degrees): 90  Right Knee Extension (Degrees): 0  Additional Comments: trapeze bar   Cammer, Mickie Bail 06/04/2011, 4:17 PM

## 2011-06-04 NOTE — Transfer of Care (Signed)
Immediate Anesthesia Transfer of Care Note  Patient: Breanna Dixon  Procedure(s) Performed: Procedure(s) (LRB): TOTAL KNEE ARTHROPLASTY (Right)  Patient Location: PACU  Anesthesia Type: General  Level of Consciousness: awake, alert  and oriented  Airway & Oxygen Therapy: Patient Spontanous Breathing and Patient connected to nasal cannula oxygen  Post-op Assessment: Report given to PACU RN, Post -op Vital signs reviewed and stable and Patient moving all extremities  Post vital signs: Reviewed and stable  Complications: No apparent anesthesia complications

## 2011-06-04 NOTE — Progress Notes (Signed)
Utilization review completed.  

## 2011-06-04 NOTE — Anesthesia Procedure Notes (Addendum)
Anesthesia Regional Block:  Femoral nerve block  Pre-Anesthetic Checklist: ,, timeout performed, Correct Patient, Correct Site, Correct Laterality, Correct Procedure, Correct Position, site marked, Risks and benefits discussed, pre-op evaluation,  At surgeon's request and post-op pain management  Laterality: Right  Prep: Maximum Sterile Barrier Precautions used and chloraprep       Needles:  Injection technique: Single-shot  Needle Type: Echogenic Stimulator Needle      Needle Gauge: 22 and 22 G    Additional Needles:  Procedures: ultrasound guided and nerve stimulator Femoral nerve block  Nerve Stimulator or Paresthesia:  Response: Patellar respose, 0.4 mA,   Additional Responses:   Narrative:  Start time: 06/04/2011 9:50 AM End time: 06/04/2011 10:01 AM Injection made incrementally with aspirations every 5 mL. Anesthesiologist: Fitzgerald,MD  Additional Notes: 2% Lidocaine skin wheel.   Femoral nerve block Procedure Name: Intubation Date/Time: 06/04/2011 10:15 AM Performed by: Rossie Muskrat L Pre-anesthesia Checklist: Patient identified, Patient being monitored, Timeout performed, Emergency Drugs available and Suction available Patient Re-evaluated:Patient Re-evaluated prior to inductionOxygen Delivery Method: Circle system utilized Preoxygenation: Pre-oxygenation with 100% oxygen Intubation Type: IV induction Ventilation: Oral airway inserted - appropriate to patient size and Mask ventilation without difficulty Laryngoscope Size: Miller and 2 Grade View: Grade I Tube type: Oral Tube size: 7.5 mm Number of attempts: 1 Airway Equipment and Method: Stylet Placement Confirmation: ETT inserted through vocal cords under direct vision,  breath sounds checked- equal and bilateral and positive ETCO2 Secured at: 20 cm Tube secured with: Tape Dental Injury: Teeth and Oropharynx as per pre-operative assessment

## 2011-06-04 NOTE — Progress Notes (Signed)
Pt. s dressing reinfonced , placed in a 6 inch ace wrapped after speaking with DR. Lucey

## 2011-06-04 NOTE — Preoperative (Signed)
Beta Blockers   Reason not to administer Beta Blockers:B blocker taken this am @ 0530

## 2011-06-04 NOTE — Anesthesia Preprocedure Evaluation (Signed)
Anesthesia Evaluation  Patient identified by MRN, date of birth, ID band Patient awake    Reviewed: Allergy & Precautions, H&P , NPO status , Patient's Chart, lab work & pertinent test results  History of Anesthesia Complications (+) AWARENESS UNDER ANESTHESIA  Airway Mallampati: I TM Distance: >3 FB Neck ROM: Full    Dental No notable dental hx. (+) Edentulous Upper and Edentulous Lower   Pulmonary pneumonia ,  breath sounds clear to auscultation  Pulmonary exam normal       Cardiovascular hypertension, On Medications and On Home Beta Blockers Rhythm:Regular Rate:Normal     Neuro/Psych  Headaches, PSYCHIATRIC DISORDERS  Neuromuscular disease    GI/Hepatic negative GI ROS, Neg liver ROS,   Endo/Other  negative endocrine ROS  Renal/GU negative Renal ROS  negative genitourinary   Musculoskeletal   Abdominal   Peds  Hematology negative hematology ROS (+)   Anesthesia Other Findings   Reproductive/Obstetrics negative OB ROS                           Anesthesia Physical Anesthesia Plan  ASA: III  Anesthesia Plan: General   Post-op Pain Management:    Induction: Intravenous  Airway Management Planned: Oral ETT  Additional Equipment:   Intra-op Plan:   Post-operative Plan: Extubation in OR  Informed Consent: I have reviewed the patients History and Physical, chart, labs and discussed the procedure including the risks, benefits and alternatives for the proposed anesthesia with the patient or authorized representative who has indicated his/her understanding and acceptance.     Plan Discussed with: CRNA  Anesthesia Plan Comments:         Anesthesia Quick Evaluation

## 2011-06-04 NOTE — Anesthesia Postprocedure Evaluation (Signed)
  Anesthesia Post-op Note  Patient: Breanna Dixon  Procedure(s) Performed: Procedure(s) (LRB): TOTAL KNEE ARTHROPLASTY (Right)  Patient Location: PACU  Anesthesia Type: GA combined with regional for post-op pain  Level of Consciousness: awake and alert   Airway and Oxygen Therapy: Patient Spontanous Breathing and Patient connected to nasal cannula oxygen  Post-op Pain: moderate  Post-op Assessment: Post-op Vital signs reviewed, Patient's Cardiovascular Status Stable, Respiratory Function Stable, Patent Airway and No signs of Nausea or vomiting  Post-op Vital Signs: Reviewed and stable  Complications: No apparent anesthesia complications

## 2011-06-04 NOTE — H&P (Signed)
Breanna Dixon MRN:  [id number] DOB/SEX:  27-Apr-1945/female  CHIEF COMPLAINT:  Painful right Knee  HISTORY: Patient is a 66 y.o. female presented with a history of pain in the right knee. Onset of symptoms was gradual starting several years ago with gradually worsening course since that time. The patient noted no past surgery on the right knee. Prior procedures on the knee include arthroscopy. Patient has been treated conservatively with over-the-counter NSAIDs and activity modification. Patient currently rates pain in the knee at 8l out of 10 with activity. There is pain at night.  PAST MEDICAL HISTORY: There are no active problems to display for this patient.  Past Medical History  Diagnosis Date  . Complication of anesthesia     woke up during previous surgery  . Cancer     breast hx  . Dysrhythmia     tachycardia  . Hypertension     take verapamil and toprol daily  . Bronchitis   . Seasonal allergies     flonase prn  . Pneumonia     hx of  . Pulmonary embolism 2006    hx of   . Blood transfusion 1974  . Ulcer, gastric, acute     hx of  . Kidney stone     hx of  . Headache     migranes  . Fibromyalgia   . Mental disorder     hx of ptsd    Past Surgical History  Procedure Date  . Cholecystectomy 1994  . Back surgery 2000/2010    fusion  . Tonsillectomy 1967  . Appendectomy 1965  . Cystoscopy     multiple  . Urethral dilation 1971  . Breast surgery     bilateral partial masectomy  . Abdominal hysterectomy 1992  . Dilation and curettage of uterus   . Hernia repair   . Colonoscopy   . Knee arthroscopy      MEDICATIONS:   Prescriptions prior to admission  Medication Sig Dispense Refill  . albuterol-ipratropium (COMBIVENT) 18-103 MCG/ACT inhaler Inhale 2 puffs into the lungs every 6 (six) hours as needed.      . carbamazepine (TEGRETOL XR) 200 MG 12 hr tablet Take 200-400 mg by mouth 2 (two) times daily. 200mg  in am and 400mg  at bedtime      . DULoxetine  (CYMBALTA) 30 MG capsule Take 90 mg by mouth daily.      . fenofibrate micronized (LOFIBRA) 134 MG capsule Take 134 mg by mouth daily before breakfast.      . fluticasone (FLONASE) 50 MCG/ACT nasal spray Place 2 sprays into the nose daily.      [name] HYDROcodone-acetaminophen (NORCO) 10-325 MG per tablet Take 1 tablet by mouth every 8 (eight) hours as needed. For pain.      [name] LORazepam (ATIVAN) 1 MG tablet Take 0.5-1 mg by mouth every 4 (four) hours as needed. For anxiety.      . metoprolol (TOPROL-XL) 200 MG 24 hr tablet Take 200 mg by mouth every morning.      . pregabalin (LYRICA) 75 MG capsule Take 75 mg by mouth 2 (two) times daily.      . promethazine (PHENERGAN) 25 MG tablet Take 25 mg by mouth every 6 (six) hours as needed. For nausea.      . verapamil (COVERA HS) 240 MG (CO) 24 hr tablet Take 240 mg by mouth at bedtime.        ALLERGIES:   Allergies  Allergen Reactions  . Imitrex (Sumatriptan  Base) Nausea And Vomiting    Rapid heart beat.  . Stadol (Butorphanol Tartrate) Nausea And Vomiting  . Toradol Nausea And Vomiting    Rapid heart beat.  Berle Mull Dye (Iodinated Diagnostic Agents) Nausea Only and Palpitations  . Shellfish Allergy Nausea Only and Palpitations    REVIEW OF SYSTEMS:  Pertinent items are noted in HPI.   FAMILY HISTORY:   Family History  Problem Relation Age of Onset  . Adopted: Yes  . Anesthesia problems Neg Hx   . Hypotension Neg Hx   . Malignant hyperthermia Neg Hx   . Pseudochol deficiency Neg Hx     SOCIAL HISTORY:   History  Substance Use Topics  . Smoking status: Current Everyday Smoker -- 0.5 packs/day for 3 years    Types: Cigarettes  . Smokeless tobacco: Not on file  . Alcohol Use: No     EXAMINATION:  Vital signs in last 24 hours:    General appearance: alert, cooperative and no distress Lungs: clear to auscultation bilaterally Heart: regular rate and rhythm, S1, S2 normal, no murmur, click, rub or gallop Abdomen: soft, non-tender;  bowel sounds normal; no masses,  no organomegaly Extremities: extremities normal, atraumatic, no cyanosis or edema and Homans sign is negative, no sign of DVT Pulses: 2+ and symmetric Skin: Skin color, texture, turgor normal. No rashes or lesions Neurologic: Alert and oriented X 3, normal strength and tone. Normal symmetric reflexes. Normal coordination and gait  Musculoskeletal:  ROM 0-110, Ligaments intact,  Imaging Review Plain radiographs demonstrate severe degenerative joint disease of the right knee. The overall alignment is mild valgus. The bone quality appears to be good for age and reported activity level.  Assessment/Plan: End stage arthritis, right knee   The patient history, physical examination and imaging studies are consistent with advanced degenerative joint disease of the right knee. The patient has failed conservative treatment.  The clearance notes were reviewed.  After discussion with the patient it was felt that Total Knee Replacement was indicated. The procedure,  risks, and benefits of total knee arthroplasty were presented and reviewed. The risks including but not limited to aseptic loosening, infection, blood clots, vascular injury, stiffness, patella tracking problems complications among others were discussed. The patient acknowledged the explanation, agreed to proceed with the plan.  Breanna Dixon 06/04/2011, 7:18 AM

## 2011-06-04 NOTE — Progress Notes (Signed)
All charting from 1300 on per R hunt RN

## 2011-06-05 ENCOUNTER — Encounter (HOSPITAL_COMMUNITY): Payer: Self-pay | Admitting: Orthopedic Surgery

## 2011-06-05 LAB — BASIC METABOLIC PANEL
BUN: 10 mg/dL (ref 6–23)
Chloride: 102 mEq/L (ref 96–112)
GFR calc Af Amer: >90 mL/min (ref >90)
Potassium: 3.7 mEq/L (ref 3.5–5.1)

## 2011-06-05 LAB — CBC
HCT: 30.2 % — ABNORMAL LOW (ref 36.0–46.0)
RDW: 12.8 % (ref 11.5–15.5)
WBC: 10 10*3/uL (ref 4.0–10.5)

## 2011-06-05 MED ORDER — OXYCODONE HCL 20 MG PO TB12
20.0000 mg | ORAL_TABLET | Freq: Two times a day (BID) | ORAL | Status: DC
  Administered 2011-06-05 – 2011-06-07 (×5): 20 mg via ORAL
  Filled 2011-06-05 (×5): qty 1

## 2011-06-05 MED ORDER — NICOTINE 21 MG/24HR TD PT24
21.0000 mg | MEDICATED_PATCH | Freq: Every day | TRANSDERMAL | Status: DC
  Administered 2011-06-05 – 2011-06-07 (×3): 21 mg via TRANSDERMAL
  Filled 2011-06-05 (×3): qty 1

## 2011-06-05 NOTE — Evaluation (Signed)
Physical Therapy Evaluation Patient Details Name: Breanna Dixon MRN: [id number] DOB: [birth year] Today's Date: 06/05/2011 Time: 1027-1100 PT Time Calculation (min): 33 min  PT Assessment / Plan / Recommendation Clinical Impression  pt. s/p R TKA.  Presently on track to go home with HHPT and family assist.    PT Assessment  Patient needs continued PT services    Follow Up Recommendations  Home health PT    Equipment Recommendations  Rolling walker with 5" wheels    Frequency 7X/week    Precautions / Restrictions Precautions Precautions: Knee Restrictions Weight Bearing Restrictions: Yes RLE Weight Bearing: Weight bearing as tolerated   Pertinent Vitals/Pain       Mobility  Bed Mobility Bed Mobility: Supine to Sit;Sitting - Scoot to Edge of Bed Supine to Sit: 4: Min assist Sitting - Scoot to [place] of Bed: 4: Min assist Details for Bed Mobility Assistance: vc's for safe technique; assist mostly at R LE to get to EOB Transfers Transfers: Sit to Stand;Stand to Sit Sit to Stand: 4: Min assist;With upper extremity assist;From bed;From toilet Stand to Sit: 4: Min assist;To chair/3-in-1;To toilet Details for Transfer Assistance: vc's for hand placement/ technique; mild lifting assist and control down onto toilet Ambulation/Gait Ambulation/Gait Assistance: 4: Min guard Ambulation Distance (Feet): 15 Feet (time 2) Assistive device: Rolling walker Ambulation/Gait Assistance Details: vc's for sequencign; mild assist maneuvering the RW Gait Pattern: Step-to pattern Stairs: No Wheelchair Mobility Wheelchair Mobility: No    Exercises Total Joint Exercises Ankle Circles/Pumps: AROM;Both;20 reps;Supine Heel Slides: AAROM;Right;10 reps;Supine   PT Goals Acute Rehab PT Goals PT Goal Formulation: With patient Time For Goal Achievement: 06/12/11 Potential to Achieve Goals: Good Pt will go Supine/Side to Sit: with supervision PT Goal: Supine/Side to Sit - Progress: Goal set  today Pt will go Sit to Stand: with supervision PT Goal: Sit to Stand - Progress: Goal set today Pt will Transfer Bed to Chair/Chair to Bed: with supervision PT Transfer Goal: Bed to Chair/Chair to Bed - Progress: Goal set today Pt will Ambulate: 51 - 150 feet;with supervision;with least restrictive assistive device;with rolling walker PT Goal: Ambulate - Progress: Goal set today Pt will Go Up / Down Stairs: 1-2 stairs;with supervision;with least restrictive assistive device PT Goal: Up/Down Stairs - Progress: Goal set today  Visit Information  Last PT Received On: 06/05/11 Assistance Needed: +1    Subjective Data  Subjective: I've got to go to the bathroom Patient Stated Goal: back home independent   Prior Functioning  Home Living Lives With: Son (dtr and son in law and 3 grand children) Available Help at Discharge: Family Type of Home: House Home Access: Stairs to enter Entrance Stairs-Number of Steps: 1 Home Layout: One level;Able to live on main level with bedroom/bathroom Bathroom Shower/Tub: Tub/shower unit;Curtain Bathroom Toilet: Standard Home Adaptive Equipment: Crutches;Raised toilet seat with rails;Straight cane Prior Function Level of Independence: Independent Able to Take Stairs?: Yes Driving: Yes Communication Communication: No difficulties    Cognition  Overall Cognitive Status: Appears within functional limits for tasks assessed/performed Arousal/Alertness: Awake/alert Orientation Level: Oriented X4 / Intact Behavior During Session: [facility] for tasks performed    Extremity/Trunk Assessment Right Lower Extremity Assessment RLE ROM/Strength/Tone: Unable to fully assess;WFL for tasks assessed Left Lower Extremity Assessment LLE ROM/Strength/Tone: Within functional levels LLE Coordination: WFL - gross/fine motor Trunk Assessment Trunk Assessment: Normal   Balance Balance Balance Assessed: No  End of Session PT - End of Session Activity Tolerance: Patient  tolerated treatment well;Other (comment) (limited by  nausea) Patient left: in chair;with call bell/phone within reach Nurse Communication: Mobility status CPM Right Knee CPM Right Knee: Off   Wylma Tatem, Eliseo Gum 06/05/2011, 1:12 PM  06/05/2011  [place], PT [phone] (pager)

## 2011-06-05 NOTE — Op Note (Signed)
TOTAL KNEE REPLACEMENT OPERATIVE NOTE:  06/04/2011  10:57 AM  PATIENT:  Breanna Dixon  66 y.o. female  PRE-OPERATIVE DIAGNOSIS:  OA RIGHT KNEE  POST-OPERATIVE DIAGNOSIS:  OA RIGHT KNEE  PROCEDURE:  Procedure(s): TOTAL KNEE ARTHROPLASTY  SURGEON:  Surgeon(s): Raymon Mutton, MD  PHYSICIAN ASSISTANT: [name], [facility]  ANESTHESIA:   general  DRAINS: Hemovac and On-Q Marcaine Pain Pump  SPECIMEN: None  COUNTS:  Correct  TOURNIQUET:   Total Tourniquet Time Documented: Thigh (Right) - 39 minutes  DICTATION:  Indication for procedure:    The patient is a 66 y.o. female who has failed conservative treatment for OA RIGHT KNEE.  Informed consent was obtained prior to anesthesia. The risks versus benefits of the operation were explain and in a way the patient can, and did, understand.   Description of procedure:     The patient was taken to the operating room and placed under anesthesia.  The patient was positioned in the usual fashion taking care that all body parts were adequately padded and/or protected.  I foley catheter was placed.  A tourniquet was applied and the leg prepped and draped in the usual sterile fashion.  The extremity was exsanguinated with the esmarch and tourniquet inflated to 350 mmHg.  Pre-operative range of motion was normal.  The knee was in 3 degree of mild varus.  A midline incision approximately 6-7 inches long was made with a #10 blade.  A new blade was used to make a parapatellar arthrotomy going 2-3 cm into the quadriceps tendon, over the patella, and alongside the medial aspect of the patellar tendon.  A synovectomy was then performed with the #10 blade and forceps. I then elevated the deep MCL off the medial tibial metaphysis subperiosteally around to the semimembranosus attachment.    I everted the patella and used calipers to measure patellar thickness.  I used the reamer to ream down to appropriate thickness to recreate the native thickness.  I  then removed excess bone with the rongeur and sagittal saw.  I used the appropriately sized template and drilled the three lug holes.  I then put the trial in place and measured the thickness with the calipers to ensure recreation of the native thickness.  The trial was then removed and the patella subluxed and the knee brought into flexion.  A homan retractor was place to retract and protect the patella and lateral structures.  A Z-retractor was place medially to protect the medial structures.  The extra-medullary alignment system was used to make cut the tibial articular surface perpendicular to the anamotic axis of the tibia and in 3 degrees of posterior slope.  The cut surface and alignment jig was removed.  I then used the intramedullary alignment guide to make a 4 valgus cut on the distal femur.  I then marked out the epicondylar axis on the distal femur.  The posterior condylar axis measured 3 degrees.  I then used the anterior referencing sizer and measured the femur to be a size D.  The 4-In-1 cutting block was screwed into place in external rotation matching the posterior condylar angle, making our cuts perpendicular to the epicondylar axis.  Anterior, posterior and chamfer cuts were made with the sagittal saw.  The cutting block and cut pieces were removed.  A lamina spreader was placed in 90 degrees of flexion.  The ACL, PCL, menisci, and posterior condylar osteophytes were removed.  A 10 mm spacer blocked was found to offer good flexion  and extension gap balance after mild in degree releasing.   The scoop retractor was then placed and the femoral finishing block was pinned in place.  The small sagittal saw was used as well as the lug drill to finish the femur.  The block and cut surfaces were removed and the medullary canal hole filled with autograft bone from the cut pieces.  The tibia was delivered forward in deep flexion and external rotation.  A size 4 tray was selected and pinned into place  centered on the medial 1/3 of the tibial tubercle.  The reamer and keel was used to prepare the tibia through the tray.    I then trialed with the size D femur, size 4 tibia, a 10 mm insert and the 35 patella.  I had excellent flexion/extension gap balance, excellent patella tracking.  Flexion was full and beyond 120 degrees; extension was zero.  These components were chosen and the staff opened them to me on the back table while the knee was lavaged copiously and the cement mixed.  I cemented in the components and removed all excess cement.  The polyethylene tibial component was snapped into place and the knee placed in extension while cement was hardening.  The capsule was infilltrated with 20cc of .25% Marcaine with epinephrine.  A hemovac was place in the joint exiting superolaterally.  A pain pump was place superomedially superficial to the arthrotomy.  Once the cement was hard, the tourniquet was let down.  Hemostasis was obtained.  The arthrotomy was closed with figure-8 #1 vicryl sutures.  The deep soft tissues were closed with #0 vicryls and the subcuticular layer closed with a running #2-0 vicryl.  The skin was reapproximated and closed with skin staples.  The wound was dressed with xeroform, 4 x4's, 2 ABD sponges, a single layer of webril and a TED stocking.   The patient was then awakened, extubated, and taken to the recovery room in stable condition.  BLOOD LOSS:  300cc DRAINS: 1 hemovac, 1 pain catheter COMPLICATIONS:  None.  PLAN OF CARE: Admit to inpatient   PATIENT DISPOSITION:  PACU - hemodynamically stable.   Delay start of Pharmacological VTE agent (>24hrs) due to surgical blood loss or risk of bleeding:  not applicable  Please fax a copy of this op note to my office at [phone] (please only include page 1 and 2 of the Case Information op note)

## 2011-06-05 NOTE — Progress Notes (Signed)
06/05/11 1645  PT Visit Information  Last PT Received On 06/05/11  Assistance Needed +1  PT Time Calculation  PT Start Time 1515  PT Stop Time 1617  PT Time Calculation (min) 62 min  Subjective Data  Subjective I've got to go to the bathroom  Patient Stated Goal back home independent  Precautions  Precautions Knee  Restrictions  Weight Bearing Restrictions Yes  RLE Weight Bearing WBAT  Cognition  Overall Cognitive Status Appears within functional limits for tasks assessed/performed  Arousal/Alertness Awake/alert  Orientation Level Oriented X4 / Intact  Behavior During Session [facility] for tasks performed  Bed Mobility  Bed Mobility Sit to Supine  Sitting - Scoot to Edge of Bed 4: Min guard  Sit to Supine 4: Min guard;HOB flat  Details for Bed Mobility Assistance assist at LE only  Transfers  Transfers Sit to Stand;Stand to [employer] Transfers  Sit to Stand 3: Mod assist;With upper extremity assist;From chair/3-in-1  Stand to Sit With upper extremity assist;To chair/3-in-1;3: Mod assist  Stand Pivot Transfers 3: Mod assist  Details for Transfer Assistance vc for safety and placement. More fatigued this pm session.  Ambulation/Gait  Ambulation/Gait Assistance 4: Min guard  Ambulation Distance (Feet) 15 Feet (x2)  Assistive device Rolling walker  Ambulation/Gait Assistance Details no cues need  Gait Pattern Step-to pattern  Stairs No  Wheelchair Mobility  Wheelchair Mobility No  Balance  Balance Assessed No  Exercises  Exercises Total Joint  Total Joint Exercises  Ankle Circles/Pumps AROM;Both;20 reps;Seated  Heel Slides AAROM;Right;10 reps;Seated  Quad Sets AROM;Both;10 reps;Seated  Short Arc Quad Right;10 reps;Seated  Hip ABduction/ADduction AAROM;Right;15 reps;Seated  Straight Leg Raises AROM;Right;10 reps;Seated  Knee Flexion AROM;Right;10 reps;Seated;Other (comment) (max 105 degrees flexion)  PT - End of Session  Activity Tolerance Patient tolerated  treatment well;Patient limited by pain  Patient left in bed;with call bell/phone within reach  Nurse Communication Mobility status  PT - Assessment/Plan  Comments on Treatment Session pt having trouble with low BP:At end of session, pt placed back in the CPM.  Initially at 0 to 80 degrees, but due to pain had to leave at 15 to 80 degrees on the CPM/  PT Plan Discharge plan remains appropriate  PT Frequency 7X/week  Recommendations for Other Services OT consult  Follow Up Recommendations Home health PT  Equipment Recommended Tub/shower bench;Rolling walker with 5" wheels  Acute Rehab PT Goals  PT Goal Formulation With patient  Time For Goal Achievement 06/12/11  Potential to Achieve Goals Good  PT Goal: Supine/Side to Sit - Progress Progressing toward goal  PT Goal: Sit to Stand - Progress Progressing toward goal  PT Transfer Goal: Bed to Chair/Chair to Bed - Progress Progressing toward goal  PT Goal: Ambulate - Progress Progressing toward goal  Pt will Perform Home Exercise Program with supervision, verbal cues required/provided  PT Goal: Perform Home Exercise Program - Progress Progressing toward goal   06/05/2011  Hammon Bing, PT [phone] (pager)

## 2011-06-05 NOTE — Progress Notes (Addendum)
Occupational Therapy Evaluation Patient Details Name: Breanna Dixon MRN: [id number] DOB: Mar 14, 1945 Today's Date: 06/05/2011 Time: 1415- 1500    OT Assessment / Plan / Recommendation Clinical Impression  Pt is a 66 yo s/p L TKA. Pt will benefit from skilled OT services secondary to deficits noted below to max indep with ADL and functional mobility for ADL to facilitate D/C home with family. PT most likely will not need OT after D/C.     OT Assessment  Patient needs continued OT Services    Follow Up Recommendations  No OT follow up    Equipment Recommendations  Tub bench, rolling walker with 5" wheels    Frequency Min 2X/week    Precautions / Restrictions Precautions Precautions: Knee Restrictions Weight Bearing Restrictions: Yes RLE Weight Bearing: Weight bearing as tolerated   Pertinent Vitals/Pain BP 86/44. C/o diaphoresis and nausea. Nsg called . Pt transferred back to recliner chair.    ADL  Eating/Feeding: Simulated;Independent Where Assessed - Eating/Feeding: Chair Grooming: Performed;Set up Where Assessed - Grooming: Supported sitting Upper Body Bathing: Simulated;Set up Where Assessed - Upper Body Bathing: Sitting, chair Lower Body Bathing: Simulated;Moderate assistance Where Assessed - Lower Body Bathing: Sit to stand from chair;Supported Location manager Dressing: Simulated;Set up Where Assessed - Upper Body Dressing: Sitting, chair;Supported Lower Body Dressing: Simulated;Moderate assistance Where Assessed - Lower Body Dressing: Sit to stand from chair;Supported Sports coach;Moderate assistance Toilet Transfer Method: Stand pivot Toilet Transfer Equipment: Bedside commode Toileting - Clothing Manipulation: Simulated;Supervision/safety Where Assessed - Toileting Clothing Manipulation: Standing Toileting - Hygiene: Simulated;Independent Where Assessed - Toileting Hygiene: Standing Tub/Shower Transfer: Performed;Moderate assistance Tub/Shower  Transfer Method: Science writer: Counsellor Used: Gait belt;Rolling walker Ambulation Related to ADLs: Min A RW level. ADL Comments: Further education regarding ADL retraining.    OT Goals Acute Rehab OT Goals OT Goal Formulation: With patient Time For Goal Achievement: 06/12/11 Potential to Achieve Goals: Good ADL Goals Pt Will Perform Lower Body Bathing: with supervision;Sit to stand from chair;Unsupported;with adaptive equipment;with cueing (comment type and amount) ADL Goal: Lower Body Bathing - Progress: Goal set today Pt Will Perform Lower Body Dressing: with supervision;Sit to stand from chair;Unsupported;with adaptive equipment;with cueing (comment type and amount) ADL Goal: Lower Body Dressing - Progress: Goal set today Pt Will Transfer to Toilet: with supervision;Ambulation;3-in-1 ADL Goal: Toilet Transfer - Progress: Goal set today Pt Will Perform Tub/Shower Transfer: Tub transfer;with supervision;with caregiver independent in assisting;Ambulation;with DME;Transfer tub bench;with cueing (comment type and amount) ADL Goal: Tub/Shower Transfer - Progress: Goal set today  Visit Information  Last OT Received On: 06/05/11 Assistance Needed: +1    Subjective Data      Prior Functioning  Home Living Lives With: Son (dtr and son in law and 3 grand children) Available Help at Discharge: Family Type of Home: Apartment Home Access: Stairs to enter Entrance Stairs-Number of Steps: 1 Home Layout: One level;Able to live on main level with bedroom/bathroom Bathroom Shower/Tub: Tub/shower unit;Curtain Firefighter: Standard Home Adaptive Equipment: Crutches;Raised toilet seat with rails;Straight cane Prior Function Level of Independence: Independent Able to Take Stairs?: Yes Driving: Yes Vocation: On disability Communication Communication: No difficulties Dominant Hand: Left    Cognition  Overall Cognitive Status: Appears  within functional limits for tasks assessed/performed Arousal/Alertness: Awake/alert Orientation Level: Oriented X4 / Intact Behavior During Session: Breanna Dixon for tasks performed    Extremity/Trunk Assessment Right Upper Extremity Assessment RUE ROM/Strength/Tone: Within functional levels Left Upper Extremity Assessment LUE ROM/Strength/Tone: Within functional levels LUE  Sensation: WFL - Light Touch Trunk Assessment Trunk Assessment: Normal;Other exceptions (history of back problems)   Mobility Bed Mobility Bed Mobility: Supine to Sit;Sitting - Scoot to Edge of Bed Supine to Sit: 4: Min assist Sitting - Scoot to [place] of Bed: 4: Min assist Details for Bed Mobility Assistance: vc's for safe technique; assist mostly at R LE to get to EOB Transfers Transfers: Sit to Stand;Stand to Sit Sit to Stand: 3: Mod assist;With upper extremity assist;From chair/3-in-1 Stand to Sit: With upper extremity assist;To chair/3-in-1;3: Mod assist Details for Transfer Assistance: vc for safety and placement. More fatigued this pm session.   Exercise   Balance Balance Balance Assessed: No  End of Session OT - End of Session Equipment Utilized During Treatment: Gait belt Activity Tolerance: Patient limited by fatigue Patient left: in chair;Other (comment) (with PT) Nurse Communication: Other (comment) (c/o diaphoresis. low BP. nsg called and assessed pt) CPM Right Knee CPM Right Knee: Off   Breanna Dixon,Breanna Dixon 06/05/2011, 3:44 PM [facility], OTR/L  [phone] 06/05/2011

## 2011-06-05 NOTE — Progress Notes (Signed)
Breanna Spurling, MD   Breanna Cabal, PA-C [address] [place], [place], [place]  [address]                             [phone]   PROGRESS NOTE  Subjective:  negative for Chest Pain  negative for Shortness of Breath  negative for Nausea/Vomiting   negative for Calf Pain  negative for Bowel Movement   Tolerating Diet: yes         Patient reports pain as 6 on 0-10 scale.    Objective: Vital signs in last 24 hours:   Patient Vitals for the past 24 hrs:  BP Temp Temp src Pulse Resp SpO2  06/05/11 0700 127/79 mmHg 98.6 F (37 C) Oral 94  20  94 %  06/04/11 2311 102/59 mmHg 98.3 F (36.8 C) Oral 82  20  97 %  06/04/11 1400 100/43 mmHg 97.9 F (36.6 C) - 64  20  97 %  06/04/11 1330 109/49 mmHg 97.9 F (36.6 C) - 74  20  97 %  06/04/11 1315 101/58 mmHg - - 69  16  97 %  06/04/11 1314 101/58 mmHg - - 76  23  96 %  06/04/11 1300 - - - 69  13  95 %  06/04/11 1252 - - - 72  19  95 %  06/04/11 1245 - - - 73  17  96 %  06/04/11 1231 - - - 72  17  95 %  06/04/11 1230 - - - 74  19  96 %  06/04/11 1226 129/54 mmHg - - - - -  06/04/11 1215 - - - 73  22  94 %  06/04/11 1211 118/42 mmHg - - - - -  06/04/11 1156 147/58 mmHg - - - - -  06/04/11 1154 - 96.8 F (36 C) - 68  17  94 %  06/04/11 1003 - - - 64  - 99 %  06/04/11 1002 - - - 66  - 99 %  06/04/11 1001 - - - 64  - 99 %  06/04/11 1000 - - - 65  - 99 %  06/04/11 0959 - - - 65  - 100 %  06/04/11 0958 - - - 64  - 99 %  06/04/11 0957 - - - 64  - 99 %  06/04/11 0956 - - - 65  - 99 %  06/04/11 0955 - - - 66  - 100 %  06/04/11 0954 - - - 66  - 100 %  06/04/11 0953 - - - 68  - 98 %  06/04/11 0952 - - - 67  18  96 %  06/04/11 0951 - - - 67  18  96 %    @flow {1959:LAST@   Intake/Output from previous day:   04/29 0701 - 04/30 0700 In: 2575 [P.O.:600; I.V.:1975] Out: 2400 [Urine:2050; Drains:300]   Intake/Output this shift:       Intake/Output      04/29 0701 - 04/30 0700 04/30 0701 - 05/01 0700   P.O. 600    I.V. 1975    Total Intake 2575    Urine 2050    Drains 300    Blood 50    Total Output 2400    Net +175            LABORATORY DATA:  Basename 06/05/11 0556  WBC 10.0  HGB 9.8*  HCT 30.2*  PLT 186  Basename 06/05/11 0556  NA 137  K 3.7  CL 102  CO2 26  BUN 10  CREATININE 0.55  GLUCOSE 115*  CALCIUM 8.7   Lab Results  Component Value Date   INR 0.98 05/28/2011    Examination:  General appearance: alert, cooperative and no distress Extremities: Homans sign is negative, no sign of DVT  Wound Exam: clean, dry, intact   Drainage:  Moderate amount Serosanguinous exudate  Motor Exam: EHL and FHL Intact  Sensory Exam: Deep Peroneal normal  Vascular Exam:    Assessment:    1 Day Post-Op  Procedure(s) (LRB): TOTAL KNEE ARTHROPLASTY (Right)  ADDITIONAL DIAGNOSIS:  Active Problems:  * No active hospital problems. *   Acute Blood Loss Anemia   Plan: Physical Therapy as ordered Weight Bearing as Tolerated (WBAT)  DVT Prophylaxis:  Lovenox  DISCHARGE PLAN: Home  DISCHARGE NEEDS: HHPT, CPM, Walker and 3-in-1 comode seat         Breanna Dixon 06/05/2011, 8:28 AM

## 2011-06-06 LAB — CBC
HCT: 28.7 % — ABNORMAL LOW (ref 36.0–46.0)
Hemoglobin: 9.5 g/dL — ABNORMAL LOW (ref 12.0–15.0)
MCHC: 33.1 g/dL (ref 30.0–36.0)
RBC: 3.29 MIL/uL — ABNORMAL LOW (ref 3.87–5.11)
WBC: 10.9 10*3/uL — ABNORMAL HIGH (ref 4.0–10.5)

## 2011-06-06 LAB — BASIC METABOLIC PANEL
BUN: 6 mg/dL (ref 6–23)
CO2: 24 mEq/L (ref 19–32)
Chloride: 104 mEq/L (ref 96–112)
Glucose, Bld: 131 mg/dL — ABNORMAL HIGH (ref 70–99)
Potassium: 3.6 mEq/L (ref 3.5–5.1)

## 2011-06-06 MED ORDER — CYCLOBENZAPRINE HCL 10 MG PO TABS
10.0000 mg | ORAL_TABLET | Freq: Three times a day (TID) | ORAL | Status: DC
  Administered 2011-06-06 – 2011-06-07 (×5): 10 mg via ORAL
  Filled 2011-06-06 (×7): qty 1

## 2011-06-06 MED ORDER — HYDROMORPHONE HCL 2 MG PO TABS
2.0000 mg | ORAL_TABLET | ORAL | Status: DC | PRN
  Administered 2011-06-06 – 2011-06-07 (×9): 2 mg via ORAL
  Filled 2011-06-06 (×9): qty 1

## 2011-06-06 NOTE — Progress Notes (Signed)
  Breanna Spurling, MD   [name], PA-C [address] [place], [place], [place]  [address]                             [phone]   PROGRESS NOTE  Subjective:  negative for Chest Pain  negative for Shortness of Breath  negative for Nausea/Vomiting   negative for Calf Pain  negative for Bowel Movement   Tolerating Diet: yes         Patient reports pain as 7 on 0-10 scale.    Objective: Vital signs in last 24 hours:   Patient Vitals for the past 24 hrs:  BP Temp Pulse Resp SpO2  06/06/11 0637 115/44 mmHg 99.3 F (37.4 C) 90  19  95 %  06/05/11 2142 100/80 mmHg 98.4 F (36.9 C) 75  20  98 %  06/05/11 1400 98/38 mmHg 98.2 F (36.8 C) 79  16  93 %    @flow {1959:LAST@   Intake/Output from previous day:   04/30 0701 - 05/01 0700 In: 480 [P.O.:480] Out: 150 [Urine:150]   Intake/Output this shift:       Intake/Output      04/30 0701 - 05/01 0700 05/01 0701 - 05/02 0700   P.O. 480    I.V.     Total Intake 480    Urine 150    Drains     Blood     Total Output 150    Net +330         Urine Occurrence 6 x       LABORATORY DATA:  Basename 06/06/11 0609 06/05/11 0556  WBC 10.9* 10.0  HGB 9.5* 9.8*  HCT 28.7* 30.2*  PLT 174 186    Basename 06/06/11 0609 06/05/11 0556  NA 136 137  K 3.6 3.7  CL 104 102  CO2 24 26  BUN 6 10  CREATININE 0.52 0.55  GLUCOSE 131* 115*  CALCIUM 8.6 8.7   Lab Results  Component Value Date   INR 0.98 05/28/2011    Examination:  General appearance: alert, cooperative and no distress Extremities: Homans sign is negative, no sign of DVT  Wound Exam: clean, dry, intact   Drainage:  None: wound tissue dry  Motor Exam: EHL and FHL Intact  Sensory Exam: Deep Peroneal normal  Vascular Exam:    Assessment:    2 Days Post-Op  Procedure(s) (LRB): TOTAL KNEE ARTHROPLASTY (Right)  ADDITIONAL DIAGNOSIS:  Active Problems:  * No active hospital problems. *   Acute Blood Loss Anemia   Plan: Physical Therapy as ordered  Weight Bearing as Tolerated (WBAT)  DVT Prophylaxis:  Lovenox  DISCHARGE PLAN: Home  DISCHARGE NEEDS: HHPT, CPM, Walker and 3-in-1 comode seat         Breanna Dixon 06/06/2011, 11:11 AM

## 2011-06-06 NOTE — Progress Notes (Signed)
Occupational Therapy Treatment Patient Details Name: Breanna Dixon MRN: [id number] DOB: [birth year] Today's Date: 06/06/2011 Time: 0820-0910 OT Time Calculation (min): 50 min  OT Assessment / Plan / Recommendation Comments on Treatment Session Pt is making steady progress, however, is limited by nausea and limited use/pain of L knee. Pt will need HHOT after D/C. Needs 3 in1 and tub bench, which are to be delivered to pt's home. discussed with case Production designer, theatre/television/film.    Follow Up Recommendations  Home health OT    Equipment Recommendations  Rolling walker with 5" wheels;3 in 1 bedside comode;Tub/shower bench    Frequency Min 2X/week   Plan Discharge plan remains appropriate    Precautions / Restrictions Precautions Precautions: Knee Restrictions Weight Bearing Restrictions: Yes   Pertinent Vitals/Pain C/o nausea. Pain 7. Nsg aware.    ADL  Grooming: Performed;Set up Where Assessed - Grooming: Supported sitting Upper Body Bathing: Performed;Set up Where Assessed - Upper Body Bathing: Sitting, chair;Supported Lower Body Bathing: Performed;Minimal assistance Where Assessed - Lower Body Bathing: Sit to stand from chair;Supported Location manager Dressing: Performed;Set up Where Assessed - Upper Body Dressing: Sitting, chair;Supported Lower Body Dressing: Performed;Minimal assistance Where Assessed - Lower Body Dressing: Sit to stand from chair;Supported Statistician: IT sales professional Method: Surveyor, minerals: Set designer - Clothing Manipulation: Performed;Independent Where Assessed - Toileting Clothing Manipulation: Standing Toileting - Hygiene: Performed;Independent Where Assessed - Toileting Hygiene: Standing Tub/Shower Transfer: Not assessed Equipment Used: Gait belt;Rolling walker Ambulation Related to ADLs: Min A RW level. ADL Comments: AE increases indep with ADl.    OT Goals Acute Rehab OT Goals OT Goal Formulation:  With patient Time For Goal Achievement: 06/12/11 Potential to Achieve Goals: Good ADL Goals Pt Will Perform Lower Body Bathing: with supervision;Sit to stand from chair;Unsupported;with adaptive equipment;with cueing (comment type and amount) ADL Goal: Lower Body Bathing - Progress: Progressing toward goals Pt Will Perform Lower Body Dressing: with supervision;Sit to stand from chair;Unsupported;with adaptive equipment;with cueing (comment type and amount) ADL Goal: Lower Body Dressing - Progress: Progressing toward goals Pt Will Transfer to Toilet: with supervision;Ambulation;3-in-1 ADL Goal: Toilet Transfer - Progress: Progressing toward goals Pt Will Perform Tub/Shower Transfer: Tub transfer;with supervision;with caregiver independent in assisting;Ambulation;with DME;Transfer tub bench;with cueing (comment type and amount) ADL Goal: Tub/Shower Transfer - Progress: Progressing toward goals  Visit Information  Last OT Received On: 06/06/11 Assistance Needed: +1               Cognition  Overall Cognitive Status: Appears within functional limits for tasks assessed/performed Arousal/Alertness: Awake/alert Orientation Level: Oriented X4 / Intact Behavior During Session: [facility] for tasks performed    Mobility Bed Mobility Bed Mobility: Supine to Sit Supine to Sit: 5: Supervision Sitting - Scoot to Edge of Bed: 5: Supervision Transfers Transfers: Sit to Stand;Stand to Sit Sit to Stand: 4: Min guard;With upper extremity assist;Other (comment) (vc for proper hand placement) Stand to Sit: 4: Min guard;With upper extremity assist;To chair/3-in-1 Details for Transfer Assistance: progressing with transfers   Exercises    Balance    End of Session OT - End of Session Equipment Utilized During Treatment: Gait belt;Other (comment) (AE - long handled sponge, reacher, sock aid) Activity Tolerance: Patient limited by pain;Patient limited by fatigue;Other (comment) (limited by nausea) Patient  left: in chair;with call bell/phone within reach Nurse Communication:  (need for pain meds and c/o nausea)   Saber Dickerman,[name] 06/06/2011, 11:55 AM Luisa Dago, OTR/L  [phone] 06/06/2011.
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
## 2011-06-07 LAB — CBC
HCT: 27 % — ABNORMAL LOW (ref 36.0–46.0)
Hemoglobin: 8.8 g/dL — ABNORMAL LOW (ref 12.0–15.0)
MCH: 28.8 pg (ref 26.0–34.0)
MCHC: 32.6 g/dL (ref 30.0–36.0)
MCV: 88.2 fL (ref 78.0–100.0)

## 2011-06-07 LAB — BASIC METABOLIC PANEL
BUN: 8 mg/dL (ref 6–23)
CO2: 23 mEq/L (ref 19–32)
Calcium: 8.9 mg/dL (ref 8.4–10.5)
GFR calc non Af Amer: >90 mL/min (ref >90)
Glucose, Bld: 117 mg/dL — ABNORMAL HIGH (ref 70–99)

## 2011-06-07 MED ORDER — CYCLOBENZAPRINE HCL 10 MG PO TABS: 10.0000 mg | ORAL_TABLET | Freq: Three times a day (TID) | ORAL | Status: AC | PRN

## 2011-06-07 MED ORDER — HYDROMORPHONE HCL 2 MG PO TABS: 2.0000 mg | ORAL_TABLET | ORAL | Status: AC | PRN

## 2011-06-07 MED ORDER — CELECOXIB 200 MG PO CAPS: 200.0000 mg | ORAL_CAPSULE | Freq: Two times a day (BID) | ORAL | Status: AC

## 2011-06-07 MED ORDER — ENOXAPARIN (LOVENOX) PATIENT EDUCATION KIT
PACK | Freq: Once | Status: AC
  Administered 2011-06-07: 13:00:00
  Filled 2011-06-07: qty 1

## 2011-06-07 MED ORDER — ENOXAPARIN SODIUM 40 MG/0.4ML ~~LOC~~ SOLN: 40.0000 mg | Freq: Two times a day (BID) | SUBCUTANEOUS | Status: DC

## 2011-06-07 MED ORDER — OXYCODONE HCL 20 MG PO TB12: 20.0000 mg | ORAL_TABLET | Freq: Two times a day (BID) | ORAL | Status: DC

## 2011-06-07 MED ORDER — ENOXAPARIN (LOVENOX) PATIENT EDUCATION KIT: 1.0000 | PACK | Freq: Once | Status: DC

## 2011-06-07 NOTE — Discharge Instructions (Signed)
Diet: As you were doing prior to hospitalization   Activity:  Increase activity slowly as tolerated                  No lifting or driving for 6 weeks  Shower:  May shower without a dressing once there is no drainage from your wound.                 Do NOT wash over the wound.  If drainage remains, cover wound with saran                  Wrap and then shower.  Clean incision with betadine and change dressing                        After saran wrap removed.  Dressing:  You may change your dressing on Thursday                    Then change the dressing daily with sterile 4"x4"s gauze dressing                     And TED hose for knees.  Use paper tape to hold dressing in place                     For hips.  You may clean the incision with alcohol prior to redressing.  Weight Bearing:  Weight bearing as tolerated as taught in physical therapy.  Use a                                walker or Crutches as instructed.  To prevent constipation: you may use a stool softener such as -               Colace ( over the counter) 100 mg by mouth twice a day                Drink plenty of fluids ( prune juice may be helpful) and high fiber foods                Miralax ( over the counter) for constipation as needed.    Precautions:  If you experience chest pain or shortness of breath - call 911 immediately               For transfer to the hospital emergency department!!               If you develop a fever greater that 101 F, purulent drainage from wound,                             increased redness or drainage from wound, or calf pain -- Call the office at                                                 [phone].  Follow- Up Appointment:  Please call for an appointment to be seen on 06/19/11  [place] - [phone]                   

## 2011-06-07 NOTE — Progress Notes (Signed)
  [name], MD   [name], PA-C [address] [place], [place], [place]  [address]                             [phone]   PROGRESS NOTE  Subjective:  negative for Chest Pain  negative for Shortness of Breath  negative for Nausea/Vomiting   negative for Calf Pain  negative for Bowel Movement   Tolerating Diet: yes         Patient reports pain as 3 on 0-10 scale.    Objective: Vital signs in last 24 hours:   Patient Vitals for the past 24 hrs:  BP Temp Temp src Pulse Resp SpO2  06/07/11 0648 111/65 mmHg 98.6 F (37 C) Oral 92  18  94 %  06/07/11 0100 - 98.5 F (36.9 C) - - - -  06/06/11 2334 - 100.1 F (37.8 C) - - - -  06/06/11 2214 91/54 mmHg 100.9 F (38.3 C) - 93  19  96 %  06/06/11 2213 120/74 mmHg - - - - -  06/06/11 1400 110/48 mmHg 98.7 F (37.1 C) - 83  20  96 %    @flow {1959:LAST@   Intake/Output from previous day:       Intake/Output this shift:       Intake/Output      05/01 0701 - 05/02 0700 05/02 0701 - 05/03 0700   P.O.     Total Intake     Urine     Total Output     Net          Urine Occurrence 4 x       LABORATORY DATA:  Basename 06/07/11 0640 06/06/11 0609 06/05/11 0556  WBC 9.4 10.9* 10.0  HGB 8.8* 9.5* 9.8*  HCT 27.0* 28.7* 30.2*  PLT 194 174 186    Basename 06/07/11 0640 06/06/11 0609 06/05/11 0556  NA 139 136 137  K 3.7 3.6 3.7  CL 104 104 102  CO2 23 24 26   BUN 8 6 10   CREATININE 0.55 0.52 0.55  GLUCOSE 117* 131* 115*  CALCIUM 8.9 8.6 8.7   Lab Results  Component Value Date   INR 0.98 05/28/2011    Examination:  General appearance: alert, cooperative and no distress Extremities: Homans sign is negative, no sign of DVT  Wound Exam: clean, dry, intact   Drainage:  None: wound tissue dry  Motor Exam: EHL and FHL Intact  Sensory Exam: Deep Peroneal normal  Vascular Exam:    Assessment:    3 Days Post-Op  Procedure(s) (LRB): TOTAL KNEE ARTHROPLASTY (Right)  ADDITIONAL DIAGNOSIS:  Active  Problems:  * No active hospital problems. *   Acute Blood Loss Anemia   Plan: Physical Therapy as ordered Weight Bearing as Tolerated (WBAT)  DVT Prophylaxis:  Lovenox  DISCHARGE PLAN: Home  DISCHARGE NEEDS: HHPT, CPM, Walker and 3-in-1 comode seat         Carla Rashad 06/07/2011, 12:29 PM

## 2011-06-07 NOTE — Discharge Summary (Signed)
Norlene Campbell, MD   Jacqualine Code, PA-C [address] [place], [place], [place]  [address]                             [phone]  PATIENT ID: Breanna Dixon        MRN:  [id number]          DOB/AGE: [birth year] / 66 y.o.    DISCHARGE SUMMARY  ADMISSION DATE:    06/04/2011 DISCHARGE DATE:   06/07/2011   ADMISSION DIAGNOSIS: OA RIGHT KNEE    DISCHARGE DIAGNOSIS:  OA RIGHT KNEE    ADDITIONAL DIAGNOSIS: Active Problems:  * No active hospital problems. *   Past Medical History  Diagnosis Date  . Complication of anesthesia     woke up during previous surgery  . Cancer     breast hx  . Dysrhythmia     tachycardia  . Hypertension     take verapamil and toprol daily  . Bronchitis   . Seasonal allergies     flonase prn  . Pneumonia     hx of  . Pulmonary embolism 2006    hx of   . Blood transfusion 1974  . Ulcer, gastric, acute     hx of  . Kidney stone     hx of  . Headache     migranes  . Fibromyalgia   . Mental disorder     hx of ptsd   . Lung mass April 2013    Right lower lobe lung mass    PROCEDURE: Procedure(s): TOTAL KNEE ARTHROPLASTY on 06/04/2011  CONSULTS:     HISTORY:  See H&P in chart  HOSPITAL COURSE:  Blen A Dixon is a 66 y.o. admitted on 06/04/2011 and found to have a diagnosis of OA RIGHT KNEE.  After appropriate laboratory studies were obtained  they were taken to the operating room on 06/04/2011 and underwent Procedure(s): TOTAL KNEE ARTHROPLASTY.   They were given perioperative antibiotics:  Anti-infectives     Start     Dose/Rate Route Frequency Ordered Stop   06/04/11 1600   ceFAZolin (ANCEF) IVPB 1 g/50 mL premix        1 g 100 mL/hr over 30 Minutes Intravenous Every 6 hours 06/04/11 1429 06/05/11 0417   06/03/11 1052   ceFAZolin (ANCEF) IVPB 2 g/50 mL premix        2 g 100 mL/hr over 30 Minutes Intravenous 60 min pre-op 06/03/11 1052 06/04/11 1015        .  Tolerated the procedure well.  Placed with a foley  intraoperatively.  Given Ofirmev at induction and for 48 hours.    POD #1, allowed out of bed to a chair.  PT for ambulation and exercise program.  Foley D/C'd in morning.  IV saline locked.  O2 discontionued.  POD #2, continued PT and ambulation.   Hemovac pulled. .  The remainder of the hospital course was dedicated to ambulation and strengthening.   The patient was discharged on 3 Days Post-Op in  Good condition.  Blood products given:none  DIAGNOSTIC STUDIES: Recent vital signs: Patient Vitals for the past 24 hrs:  BP Temp Temp src Pulse Resp SpO2  06/07/11 0648 111/65 mmHg 98.6 F (37 C) Oral 92  18  94 %  06/07/11 0100 - 98.5 F (36.9 C) - - - -  06/06/11 2334 - 100.1 F (37.8 C) - - - -  Jul 06, 2011 2214 91/54 mmHg 100.9 F (38.3 C) - 93  19  96 %  07-06-11 2213 120/74 mmHg - - - - -  Jul 06, 2011 1400 110/48 mmHg 98.7 F (37.1 C) - 83  20  96 %       Recent laboratory studies:  Basename 06/07/11 0640 2011-07-06 0609 06/05/11 0556  WBC 9.4 10.9* 10.0  HGB 8.8* 9.5* 9.8*  HCT 27.0* 28.7* 30.2*  PLT 194 174 186    Basename 06/07/11 0640 Jul 06, 2011 0609 06/05/11 0556  NA 139 136 137  K 3.7 3.6 3.7  CL 104 104 102  CO2 23 24 26   BUN 8 6 10   CREATININE 0.55 0.52 0.55  GLUCOSE 117* 131* 115*  CALCIUM 8.9 8.6 8.7   Lab Results  Component Value Date   INR 0.98 05/28/2011     Recent Radiographic Studies :  Dg Chest 2 View  05/28/2011  *RADIOLOGY REPORT*  Clinical Data: Preop right knee arthroplasty.  Cough, hypertension.  CHEST - 2 VIEW  Comparison: None.  Findings: 18 mm nodule at the right lung base on the frontal radiograph, without definite correlate on the lateral.  Left lung clear.  Heart size normal.  Minimal aortic plaque.  No effusion. Regional bones unremarkable.  IMPRESSION:  1.  Question right lower lobe pulmonary nodule.  Recommend CT chest with contrast for further evaluation to exclude neoplasm.  Original Report Authenticated By: Osa Craver, M.D.   Ct  Chest Wo Contrast  05/30/2011  *RADIOLOGY REPORT*  Clinical Data: Smoking history.  Right lower lobe nodules seen on chest radiography.  History of breast cancer.  CT CHEST WITHOUT CONTRAST  Technique:  Multidetector CT imaging of the chest was performed following the standard protocol without IV contrast.  Comparison: Chest radiography 04/22  Findings: The lungs do not reflect a background pattern of emphysema.  Centrally in the right lower lobe, there is a slightly multilobular entity measuring 17 x 14 by 16 mm.  The edges appear well marginated.  No other focal lesion in the right lung other than mild linear scarring at the right base and in the right middle lobe.  On the left, there is a benign 2 mm subpleural nodule laterally on image 27.  There are a cluster of parenchymal calcifications at the left base consistent with granulomas.  There are numerous calcified lymph nodes in the left hilum and in the mediastinum.  There are also some calcified lymph nodes in the celiac region of the upper abdomen.  No pleural or pericardial fluid.  There is mild atherosclerosis of the aorta.  There is ordinary chronic degenerative change of the spine.  IMPRESSION: 17 x 14 x 16 mm well marginated mass in the right lower lobe centrally.  This could represent a lung carcinoma.  However, well marginated character raises the possibility of hamartoma, a venous varix or bronchial atresia.  Are there old studies at any other location?  If this can be defined as chronic, it could be followed. If no prior studies exist, this abnormality is large enough that PET scan should be reliable for evaluation.  Evidence of previous granulomatous infection in the left lower lobe with calcified lymph nodes in the left hilum, mediastinum and in the celiac axis region.  This is obviously old and inactive disease.  Original Report Authenticated By: Thomasenia Sales, M.D.    DISCHARGE INSTRUCTIONS: Discharge Orders    Future Orders Please Complete  By Expires   Diet - low sodium  heart healthy      Call MD / Call 911      Comments:   If you experience chest pain or shortness of breath, CALL 911 and be transported to the hospital emergency room.  If you develope a fever above 101 F, pus (white drainage) or increased drainage or redness at the wound, or calf pain, call your surgeon's office.   Constipation Prevention      Comments:   Drink plenty of fluids.  Prune juice may be helpful.  You may use a stool softener, such as Colace (over the counter) 100 mg twice a day.  Use MiraLax (over the counter) for constipation as needed.   Increase activity slowly as tolerated      Weight Bearing as taught in Physical Therapy      Comments:   Use a walker or crutches as instructed.   Driving restrictions      Comments:   No driving for 6 weeks   Lifting restrictions      Comments:   No lifting for 6 weeks   CPM      Comments:   Continuous passive motion machine (CPM):      Use the CPM from 0 to 90 for 6-8 hours per day.      You may increase by 10 per day.  You may break it up into 2 or 3 sessions per day.      Use CPM for 2 weeks or until you are told to stop.   TED hose      Comments:   Use stockings (TED hose) for 3 weeks on both leg(s).  You may remove them at night for sleeping.   Change dressing      Comments:   Change dressing on thursday, then change the dressing daily with sterile 4 x 4 inch gauze dressing and apply TED hose.  You may clean the incision with alcohol prior to redressing.   Do not put a pillow under the knee. Place it under the heel.         DISCHARGE MEDICATIONS:   Medication List  As of 06/07/2011 12:36 PM   STOP taking these medications         HYDROcodone-acetaminophen 10-325 MG per tablet         TAKE these medications         albuterol-ipratropium 18-103 MCG/ACT inhaler   Commonly known as: COMBIVENT   Inhale 2 puffs into the lungs every 6 (six) hours as needed.      carbamazepine 200 MG 12 hr  tablet   Commonly known as: TEGRETOL XR   Take 200-400 mg by mouth 2 (two) times daily. 200mg  in am and 400mg  at bedtime      celecoxib 200 MG capsule   Commonly known as: CELEBREX   Take 1 capsule (200 mg total) by mouth 2 (two) times daily.      cyclobenzaprine 10 MG tablet   Commonly known as: FLEXERIL   Take 1 tablet (10 mg total) by mouth 3 (three) times daily as needed for muscle spasms.      DULoxetine 30 MG capsule   Commonly known as: CYMBALTA   Take 90 mg by mouth daily.      enoxaparin 40 MG/0.4ML injection   Commonly known as: LOVENOX   Inject 0.4 mLs (40 mg total) into the skin every 12 (twelve) hours.      fenofibrate micronized 134 MG capsule   Commonly known as: LOFIBRA  Take 134 mg by mouth daily before breakfast.      fluticasone 50 MCG/ACT nasal spray   Commonly known as: FLONASE   Place 2 sprays into the nose daily.      HYDROmorphone 2 MG tablet   Commonly known as: DILAUDID   Take 1 tablet (2 mg total) by mouth every 4 (four) hours as needed.      LORazepam 1 MG tablet   Commonly known as: ATIVAN   Take 0.5-1 mg by mouth every 4 (four) hours as needed. For anxiety.      metoprolol 200 MG 24 hr tablet   Commonly known as: TOPROL-XL   Take 200 mg by mouth every morning.      oxyCODONE 20 MG 12 hr tablet   Commonly known as: OXYCONTIN   Take 1 tablet (20 mg total) by mouth every 12 (twelve) hours.      pregabalin 75 MG capsule   Commonly known as: LYRICA   Take 75 mg by mouth 2 (two) times daily.      promethazine 25 MG tablet   Commonly known as: PHENERGAN   Take 25 mg by mouth every 6 (six) hours as needed. For nausea.      verapamil 240 MG (CO) 24 hr tablet   Commonly known as: COVERA HS   Take 240 mg by mouth at bedtime.            FOLLOW UP VISIT:   Follow-up Information    Call on 06/19/2011 to follow up.         DISPOSITION:  Home    CONDITION:  Good   Taurus Alamo 06/07/2011, 12:36 PM

## 2011-06-07 NOTE — Progress Notes (Signed)
PT Progress Note:     06/07/11 1400  PT Visit Information  Last PT Received On 06/07/11  Assistance Needed +1  PT Time Calculation  PT Start Time 1121  PT Stop Time 1148  PT Time Calculation (min) 27 min  Subjective Data  Subjective "im feeling much better"  Precautions  Precautions Knee  Restrictions  RLE Weight Bearing WBAT  Cognition  Overall Cognitive Status Appears within functional limits for tasks assessed/performed  Arousal/Alertness Awake/alert  Orientation Level Oriented X4 / Intact  Behavior During Session [facility] for tasks performed  Bed Mobility  Bed Mobility Supine to Sit;Sit to Supine  Supine to Sit HOB flat;6: Modified independent (Device/Increase time)  Sitting - Scoot to Edge of Bed 6: Modified independent (Device/Increase time)  Sit to Supine 6: Modified independent (Device/Increase time);HOB flat  Details for Bed Mobility Assistance Cues for hooking technique.    Transfers  Transfers Sit to Stand;Stand to Sit  Sit to Stand 4: Min assist;From bed;From chair/3-in-1;With upper extremity assist;With armrests  Stand to Sit 5: Supervision;With upper extremity assist;To bed;To chair/3-in-1;With armrests  Details for Transfer Assistance Cues for safe technique & body positioning before sitting.  (A) for balance   Ambulation/Gait  Ambulation/Gait Assistance 5: Supervision  Ambulation Distance (Feet) 100 Feet  Assistive device Rolling walker  Ambulation/Gait Assistance Details Cues for upright posture & encouragement to decrease reliance of UE's on RW  Gait Pattern Step-to pattern;Decreased stance time - right;Decreased step length - left  Stairs Yes  Stairs Assistance 4: Min assist;4: Min guard  Stairs Assistance Details (indicate cue type and reason) Min (A) for balance & safety with forward technique but Min Guard (A) with backwards technique.  Cues for technique & sequencing.     Stair Management Technique No rails;Backwards;Forwards;With walker  Number of Stairs  1  (4x's)  Wheelchair Mobility  Wheelchair Mobility No  Balance  Balance Assessed No  Exercises  Exercises Total Joint  Total Joint Exercises  Ankle Circles/Pumps AROM;20 reps;Supine  Quad Sets AROM;Strengthening;Both;20 reps;Supine  Straight Leg Raises AAROM;Strengthening;15 reps;Supine;Right  Long [employer] AAROM;Strengthening;Right;15 reps;Seated  PT - End of Session  Activity Tolerance Patient tolerated treatment well  Patient left in bed;with call bell/phone within reach  PT - Assessment/Plan  Comments on Treatment Session Initiated stair training this session- pt did well.    PT Plan Discharge plan remains appropriate  PT Frequency 7X/week  Recommendations for Other Services OT consult  Follow Up Recommendations Home health PT  Equipment Recommended Rolling walker with 5" wheels;3 in 1 bedside comode;Tub/shower bench  Acute Rehab PT Goals  PT Goal: Supine/Side to Sit - Progress Met  PT Goal: Sit to Stand - Progress Progressing toward goal  PT Goal: Ambulate - Progress Progressing toward goal  PT Goal: Up/Down Stairs - Progress Progressing toward goal  PT Goal: Perform Home Exercise Program - Progress Progressing toward goal      Verdell Face, [place] [phone] 06/07/2011

## 2011-06-26 ENCOUNTER — Ambulatory Visit: Payer: Medicare Other | Attending: Orthopedic Surgery | Admitting: Physical Therapy

## 2011-06-26 DIAGNOSIS — M25569 Pain in unspecified knee: Secondary | ICD-10-CM | POA: Insufficient documentation

## 2011-06-26 DIAGNOSIS — IMO0001 Reserved for inherently not codable concepts without codable children: Secondary | ICD-10-CM | POA: Insufficient documentation

## 2011-06-26 DIAGNOSIS — R262 Difficulty in walking, not elsewhere classified: Secondary | ICD-10-CM | POA: Insufficient documentation

## 2011-06-26 DIAGNOSIS — Z96659 Presence of unspecified artificial knee joint: Secondary | ICD-10-CM | POA: Insufficient documentation

## 2011-06-26 DIAGNOSIS — M25669 Stiffness of unspecified knee, not elsewhere classified: Secondary | ICD-10-CM | POA: Insufficient documentation

## 2011-07-03 ENCOUNTER — Ambulatory Visit: Payer: Medicare Other | Admitting: Physical Therapy

## 2011-07-05 ENCOUNTER — Ambulatory Visit: Payer: Medicare Other | Admitting: Physical Therapy

## 2011-07-10 ENCOUNTER — Ambulatory Visit: Payer: Medicare Other | Attending: Orthopedic Surgery | Admitting: Physical Therapy

## 2011-07-10 DIAGNOSIS — Z96659 Presence of unspecified artificial knee joint: Secondary | ICD-10-CM | POA: Insufficient documentation

## 2011-07-10 DIAGNOSIS — M25569 Pain in unspecified knee: Secondary | ICD-10-CM | POA: Insufficient documentation

## 2011-07-10 DIAGNOSIS — IMO0001 Reserved for inherently not codable concepts without codable children: Secondary | ICD-10-CM | POA: Insufficient documentation

## 2011-07-10 DIAGNOSIS — M25669 Stiffness of unspecified knee, not elsewhere classified: Secondary | ICD-10-CM | POA: Insufficient documentation

## 2011-07-10 DIAGNOSIS — R262 Difficulty in walking, not elsewhere classified: Secondary | ICD-10-CM | POA: Insufficient documentation

## 2011-07-12 ENCOUNTER — Ambulatory Visit: Payer: Medicare Other | Admitting: Physical Therapy

## 2011-07-17 ENCOUNTER — Ambulatory Visit: Payer: Medicare Other | Admitting: Physical Therapy

## 2011-07-19 ENCOUNTER — Ambulatory Visit: Payer: Medicare Other | Admitting: Physical Therapy

## 2011-07-24 ENCOUNTER — Ambulatory Visit: Payer: Medicare Other | Admitting: Physical Therapy

## 2011-07-25 ENCOUNTER — Ambulatory Visit: Payer: Medicare Other | Admitting: Physical Therapy

## 2011-07-26 ENCOUNTER — Ambulatory Visit: Payer: Medicare Other | Admitting: Physical Therapy

## 2011-07-31 ENCOUNTER — Ambulatory Visit: Payer: Medicare Other | Admitting: Physical Therapy

## 2011-08-02 ENCOUNTER — Ambulatory Visit: Payer: Medicare Other | Admitting: Physical Therapy

## 2011-08-13 ENCOUNTER — Ambulatory Visit: Payer: Medicare Other | Admitting: Physical Therapy

## 2011-08-16 ENCOUNTER — Ambulatory Visit: Payer: Medicare Other | Attending: Orthopedic Surgery | Admitting: Physical Therapy

## 2011-08-16 DIAGNOSIS — IMO0001 Reserved for inherently not codable concepts without codable children: Secondary | ICD-10-CM | POA: Insufficient documentation

## 2011-08-16 DIAGNOSIS — M25569 Pain in unspecified knee: Secondary | ICD-10-CM | POA: Insufficient documentation

## 2011-08-16 DIAGNOSIS — Z96659 Presence of unspecified artificial knee joint: Secondary | ICD-10-CM | POA: Insufficient documentation

## 2011-08-16 DIAGNOSIS — R262 Difficulty in walking, not elsewhere classified: Secondary | ICD-10-CM | POA: Insufficient documentation

## 2011-08-16 DIAGNOSIS — M25669 Stiffness of unspecified knee, not elsewhere classified: Secondary | ICD-10-CM | POA: Insufficient documentation

## 2011-08-21 ENCOUNTER — Ambulatory Visit: Payer: Medicare Other | Admitting: Physical Therapy

## 2011-08-23 ENCOUNTER — Ambulatory Visit: Payer: Medicare Other | Admitting: Physical Therapy

## 2011-08-28 ENCOUNTER — Ambulatory Visit: Payer: Medicare Other | Admitting: Physical Therapy

## 2011-08-30 ENCOUNTER — Ambulatory Visit: Payer: Medicare Other | Admitting: Physical Therapy

## 2011-09-04 ENCOUNTER — Ambulatory Visit: Payer: Medicare Other | Admitting: Physical Therapy

## 2011-09-06 ENCOUNTER — Ambulatory Visit: Payer: Medicare Other | Admitting: Physical Therapy

## 2011-09-11 ENCOUNTER — Ambulatory Visit: Payer: Medicare Other | Admitting: Physical Therapy

## 2011-09-13 ENCOUNTER — Encounter: Payer: Medicare Other | Admitting: Physical Therapy

## 2011-10-03 ENCOUNTER — Encounter (HOSPITAL_COMMUNITY): Payer: Self-pay | Admitting: Respiratory Therapy

## 2011-10-10 ENCOUNTER — Other Ambulatory Visit: Payer: Self-pay | Admitting: Orthopedic Surgery

## 2011-10-11 ENCOUNTER — Encounter (HOSPITAL_COMMUNITY)
Admission: RE | Admit: 2011-10-11 | Discharge: 2011-10-11 | Disposition: A | Discharge status: Home / Self Care | Payer: Medicare Other | Source: Ambulatory Visit | Attending: Orthopedic Surgery | Admitting: Orthopedic Surgery

## 2011-10-11 ENCOUNTER — Encounter (HOSPITAL_COMMUNITY): Payer: Self-pay

## 2011-10-11 HISTORY — DX: Unspecified osteoarthritis, unspecified site: M19.90

## 2011-10-11 LAB — CBC WITH DIFFERENTIAL/PLATELET
Basophils Relative: 0 % (ref 0–1)
HCT: 41.6 % (ref 36.0–46.0)
Hemoglobin: 13.7 g/dL (ref 12.0–15.0)
Lymphs Abs: 2.8 10*3/uL (ref 0.7–4.0)
MCHC: 32.9 g/dL (ref 30.0–36.0)
Monocytes Absolute: 0.6 10*3/uL (ref 0.1–1.0)
Monocytes Relative: 6 % (ref 3–12)
Neutro Abs: 6.8 10*3/uL (ref 1.7–7.7)
Neutrophils Relative %: 66 % (ref 43–77)
RBC: 4.81 MIL/uL (ref 3.87–5.11)

## 2011-10-11 LAB — COMPREHENSIVE METABOLIC PANEL
Albumin: 4.1 g/dL (ref 3.5–5.2)
Alkaline Phosphatase: 87 U/L (ref 39–117)
BUN: 15 mg/dL (ref 6–23)
CO2: 26 mEq/L (ref 19–32)
Chloride: 104 mEq/L (ref 96–112)
Creatinine, Ser: 0.54 mg/dL (ref 0.50–1.10)
GFR calc Af Amer: >90 mL/min (ref >90)
GFR calc non Af Amer: >90 mL/min (ref >90)
Glucose, Bld: 109 mg/dL — ABNORMAL HIGH (ref 70–99)
Potassium: 4.6 mEq/L (ref 3.5–5.1)
Total Bilirubin: 0.2 mg/dL — ABNORMAL LOW (ref 0.3–1.2)

## 2011-10-11 LAB — APTT: aPTT: 30 seconds (ref 24–37)

## 2011-10-11 LAB — URINALYSIS, ROUTINE W REFLEX MICROSCOPIC
Hgb urine dipstick: NEGATIVE
Ketones, ur: NEGATIVE mg/dL
Nitrite: NEGATIVE
Specific Gravity, Urine: 1.03 (ref 1.005–1.030)
Urobilinogen, UA: 0.2 mg/dL (ref 0.0–1.0)

## 2011-10-11 LAB — PROTIME-INR
INR: 1.01 (ref 0.00–1.49)
Prothrombin Time: 13.5 seconds (ref 11.6–15.2)

## 2011-10-11 LAB — TYPE AND SCREEN: Antibody Screen: NEGATIVE

## 2011-10-11 NOTE — Pre-Procedure Instructions (Signed)
Breanna Dixon  10/11/2011   Your procedure is scheduled on:  Monday, September 9th.  Report to [facility] at 5:30 AM.  Call this number if you have problems the morning of surgery: [phone]   Remember:   Do not eat food or rink any liquid:After Midnight.     Take these medicines the morning of surgery with A SIP OF WATER: Carbamazepine (Tegretol XL),  Duloxetine (Cymbalta), Metoprolol (Toprol XL), Pregabalin (Lyrica).   Use Nasal Inhaler. May use Albuterol-Ipratropium (Combivent) and bring Inhaler in to the hospital with you. MAy take Lorazepam (Ativan), Methocarbamol (Robaxin) and Promethazine (Phenergan).   Do not wear jewelry, make-up or nail polish.  Do not wear lotions, powders, or perfumes. You may wear deodorant.  Do not shave 48 hours prior to surgery. Men may shave face and neck.  Do not bring valuables to the hospital.  Contacts, dentures or bridgework may not be worn into surgery.  Leave suitcase in the car. After surgery it may be brought to your room.  For patients admitted to the hospital, checkout time is 11:00 AM the day of discharge.   Patients discharged the day of surgery will not be allowed to drive home.  Name and phone number of your driver: NA  Special Instructions: Incentive Spirometry - Practice and bring it with you on the day of surgery. and CHG Shower Use Special Wash: 1/2 bottle night before surgery and 1/2 bottle morning of surgery.   Please read over the following fact sheets that you were given: Pain Booklet, Coughing and Deep Breathing, Blood Transfusion Information and Surgical Site Infection Prevention

## 2011-10-12 LAB — URINE CULTURE: Colony Count: NO GROWTH

## 2011-10-14 MED ORDER — CHLORHEXIDINE GLUCONATE 4 % EX LIQD: 60.0000 mL | Freq: Once | CUTANEOUS | Status: DC

## 2011-10-14 MED ORDER — CEFAZOLIN SODIUM-DEXTROSE 2-3 GM-% IV SOLR
2.0000 g | INTRAVENOUS | Status: AC
  Administered 2011-10-15: 2 g via INTRAVENOUS
  Filled 2011-10-14: qty 50

## 2011-10-14 MED ORDER — SODIUM CHLORIDE 0.9 % IV SOLN: INTRAVENOUS | Status: DC

## 2011-10-14 MED ORDER — ACETAMINOPHEN 10 MG/ML IV SOLN
1000.0000 mg | Freq: Four times a day (QID) | INTRAVENOUS | Status: DC
  Administered 2011-10-15: 1000 mg via INTRAVENOUS
  Filled 2011-10-14 (×3): qty 100

## 2011-10-15 ENCOUNTER — Inpatient Hospital Stay (HOSPITAL_COMMUNITY): Payer: Medicare Other | Admitting: Anesthesiology

## 2011-10-15 ENCOUNTER — Encounter (HOSPITAL_COMMUNITY)
Admission: RE | Disposition: A | Discharge status: Home / Self Care | Payer: Self-pay | Source: Ambulatory Visit | Attending: Orthopedic Surgery

## 2011-10-15 ENCOUNTER — Inpatient Hospital Stay (HOSPITAL_COMMUNITY)
Admission: RE | Admit: 2011-10-15 | Discharge: 2011-10-18 | DRG: 470 | Disposition: A | Discharge status: Home / Self Care | Payer: Medicare Other | Source: Ambulatory Visit | Attending: Orthopedic Surgery | Admitting: Orthopedic Surgery

## 2011-10-15 ENCOUNTER — Encounter (HOSPITAL_COMMUNITY): Payer: Self-pay | Admitting: Anesthesiology

## 2011-10-15 ENCOUNTER — Encounter (HOSPITAL_COMMUNITY): Payer: Self-pay | Admitting: *Deleted

## 2011-10-15 DIAGNOSIS — M1712 Unilateral primary osteoarthritis, left knee: Secondary | ICD-10-CM

## 2011-10-15 DIAGNOSIS — Z01811 Encounter for preprocedural respiratory examination: Secondary | ICD-10-CM

## 2011-10-15 DIAGNOSIS — IMO0001 Reserved for inherently not codable concepts without codable children: Secondary | ICD-10-CM | POA: Diagnosis present

## 2011-10-15 DIAGNOSIS — F17200 Nicotine dependence, unspecified, uncomplicated: Secondary | ICD-10-CM | POA: Diagnosis present

## 2011-10-15 DIAGNOSIS — Z853 Personal history of malignant neoplasm of breast: Secondary | ICD-10-CM

## 2011-10-15 DIAGNOSIS — Z981 Arthrodesis status: Secondary | ICD-10-CM

## 2011-10-15 DIAGNOSIS — Z01818 Encounter for other preprocedural examination: Secondary | ICD-10-CM

## 2011-10-15 DIAGNOSIS — Z86711 Personal history of pulmonary embolism: Secondary | ICD-10-CM

## 2011-10-15 DIAGNOSIS — Z01812 Encounter for preprocedural laboratory examination: Secondary | ICD-10-CM

## 2011-10-15 DIAGNOSIS — M1710 Unilateral primary osteoarthritis, unspecified knee: Principal | ICD-10-CM | POA: Diagnosis present

## 2011-10-15 DIAGNOSIS — I10 Essential (primary) hypertension: Secondary | ICD-10-CM | POA: Diagnosis present

## 2011-10-15 DIAGNOSIS — Z79899 Other long term (current) drug therapy: Secondary | ICD-10-CM

## 2011-10-15 DIAGNOSIS — Z7982 Long term (current) use of aspirin: Secondary | ICD-10-CM

## 2011-10-15 DIAGNOSIS — Z96659 Presence of unspecified artificial knee joint: Secondary | ICD-10-CM

## 2011-10-15 DIAGNOSIS — D62 Acute posthemorrhagic anemia: Secondary | ICD-10-CM | POA: Diagnosis not present

## 2011-10-15 HISTORY — PX: TOTAL KNEE ARTHROPLASTY: SHX125

## 2011-10-15 SURGERY — ARTHROPLASTY, KNEE, TOTAL
Anesthesia: Regional | Site: Knee | Laterality: Left | Wound class: Clean

## 2011-10-15 MED ORDER — LORAZEPAM 0.5 MG PO TABS
0.5000 mg | ORAL_TABLET | ORAL | Status: DC | PRN
  Administered 2011-10-15 – 2011-10-16 (×6): 1 mg via ORAL
  Administered 2011-10-17: 0.5 mg via ORAL
  Administered 2011-10-17: 1 mg via ORAL
  Administered 2011-10-18: 0.5 mg via ORAL
  Filled 2011-10-15 (×7): qty 2
  Filled 2011-10-15 (×2): qty 1

## 2011-10-15 MED ORDER — ACETAMINOPHEN 10 MG/ML IV SOLN
1000.0000 mg | Freq: Four times a day (QID) | INTRAVENOUS | Status: AC
  Administered 2011-10-15 – 2011-10-16 (×4): 1000 mg via INTRAVENOUS
  Filled 2011-10-15 (×5): qty 100

## 2011-10-15 MED ORDER — BUPIVACAINE-EPINEPHRINE 0.25% -1:200000 IJ SOLN
INTRAMUSCULAR | Status: DC | PRN
  Administered 2011-10-15: 20 mL

## 2011-10-15 MED ORDER — BUPIVACAINE-EPINEPHRINE PF 0.25-1:200000 % IJ SOLN
INTRAMUSCULAR | Status: AC
  Filled 2011-10-15: qty 30

## 2011-10-15 MED ORDER — CEFAZOLIN SODIUM-DEXTROSE 2-3 GM-% IV SOLR
2.0000 g | Freq: Four times a day (QID) | INTRAVENOUS | Status: AC
  Administered 2011-10-15 (×2): 2 g via INTRAVENOUS
  Filled 2011-10-15 (×2): qty 50

## 2011-10-15 MED ORDER — LACTATED RINGERS IV SOLN
INTRAVENOUS | Status: DC | PRN
  Administered 2011-10-15 (×2): via INTRAVENOUS

## 2011-10-15 MED ORDER — MIDAZOLAM HCL 5 MG/5ML IJ SOLN
INTRAMUSCULAR | Status: DC | PRN
  Administered 2011-10-15: 2 mg via INTRAVENOUS

## 2011-10-15 MED ORDER — HYDROMORPHONE HCL PF 1 MG/ML IJ SOLN
1.0000 mg | INTRAMUSCULAR | Status: DC | PRN
  Administered 2011-10-15 – 2011-10-16 (×10): 1 mg via INTRAVENOUS
  Filled 2011-10-15 (×10): qty 1

## 2011-10-15 MED ORDER — VERAPAMIL HCL ER 240 MG PO TBCR
240.0000 mg | EXTENDED_RELEASE_TABLET | Freq: Every day | ORAL | Status: DC
  Administered 2011-10-15 – 2011-10-17 (×3): 240 mg via ORAL
  Filled 2011-10-15 (×5): qty 1

## 2011-10-15 MED ORDER — HYDROMORPHONE HCL PF 1 MG/ML IJ SOLN
INTRAMUSCULAR | Status: AC
  Filled 2011-10-15: qty 2

## 2011-10-15 MED ORDER — FENOFIBRATE 54 MG PO TABS
54.0000 mg | ORAL_TABLET | Freq: Every day | ORAL | Status: DC
  Administered 2011-10-15 – 2011-10-18 (×4): 54 mg via ORAL
  Filled 2011-10-15 (×4): qty 1

## 2011-10-15 MED ORDER — BUPIVACAINE 0.25 % ON-Q PUMP SINGLE CATH 300ML
INJECTION | Status: DC | PRN
  Administered 2011-10-15: 300 mL

## 2011-10-15 MED ORDER — METHOCARBAMOL 100 MG/ML IJ SOLN
500.0000 mg | Freq: Four times a day (QID) | INTRAMUSCULAR | Status: DC | PRN
  Filled 2011-10-15 (×2): qty 5

## 2011-10-15 MED ORDER — ENOXAPARIN SODIUM 30 MG/0.3ML ~~LOC~~ SOLN
30.0000 mg | Freq: Two times a day (BID) | SUBCUTANEOUS | Status: DC
  Administered 2011-10-16 – 2011-10-18 (×5): 30 mg via SUBCUTANEOUS
  Filled 2011-10-15 (×7): qty 0.3

## 2011-10-15 MED ORDER — CARBAMAZEPINE ER 200 MG PO TB12
200.0000 mg | ORAL_TABLET | Freq: Every day | ORAL | Status: DC
  Administered 2011-10-16 – 2011-10-18 (×3): 200 mg via ORAL
  Filled 2011-10-15 (×3): qty 1

## 2011-10-15 MED ORDER — SODIUM CHLORIDE 0.9 % IV SOLN
INTRAVENOUS | Status: DC
  Administered 2011-10-15 – 2011-10-16 (×2): via INTRAVENOUS

## 2011-10-15 MED ORDER — CARBAMAZEPINE ER 200 MG PO TB12: 200.0000 mg | ORAL_TABLET | Freq: Two times a day (BID) | ORAL | Status: DC

## 2011-10-15 MED ORDER — FENTANYL CITRATE 0.05 MG/ML IJ SOLN
INTRAMUSCULAR | Status: DC | PRN
  Administered 2011-10-15: 50 ug via INTRAVENOUS
  Administered 2011-10-15 (×4): 25 ug via INTRAVENOUS
  Administered 2011-10-15: 50 ug via INTRAVENOUS

## 2011-10-15 MED ORDER — BUPIVACAINE 0.25 % ON-Q PUMP SINGLE CATH 300ML
300.0000 mL | INJECTION | Status: DC
  Filled 2011-10-15: qty 300

## 2011-10-15 MED ORDER — OXYCODONE HCL 5 MG PO TABS
5.0000 mg | ORAL_TABLET | ORAL | Status: DC | PRN
  Administered 2011-10-15 – 2011-10-17 (×8): 10 mg via ORAL
  Filled 2011-10-15 (×5): qty 2
  Filled 2011-10-15: qty 1
  Filled 2011-10-15 (×3): qty 2

## 2011-10-15 MED ORDER — SENNOSIDES-DOCUSATE SODIUM 8.6-50 MG PO TABS: 1.0000 | ORAL_TABLET | Freq: Every evening | ORAL | Status: DC | PRN

## 2011-10-15 MED ORDER — VERAPAMIL HCL 240 MG (CO) PO TB24: 240.0000 mg | ORAL_TABLET | Freq: Every day | ORAL | Status: DC

## 2011-10-15 MED ORDER — PREGABALIN 50 MG PO CAPS
75.0000 mg | ORAL_CAPSULE | Freq: Two times a day (BID) | ORAL | Status: DC
  Administered 2011-10-15 – 2011-10-18 (×6): 75 mg via ORAL
  Filled 2011-10-15 (×4): qty 1
  Filled 2011-10-15: qty 3
  Filled 2011-10-15: qty 1

## 2011-10-15 MED ORDER — ONDANSETRON HCL 4 MG/2ML IJ SOLN
INTRAMUSCULAR | Status: DC | PRN
  Administered 2011-10-15: 4 mg via INTRAVENOUS

## 2011-10-15 MED ORDER — METOPROLOL SUCCINATE ER 100 MG PO TB24: 200.0000 mg | ORAL_TABLET | ORAL | Status: DC

## 2011-10-15 MED ORDER — ACETAMINOPHEN 325 MG PO TABS: 650.0000 mg | ORAL_TABLET | Freq: Four times a day (QID) | ORAL | Status: DC | PRN

## 2011-10-15 MED ORDER — METHOCARBAMOL 500 MG PO TABS
500.0000 mg | ORAL_TABLET | Freq: Four times a day (QID) | ORAL | Status: DC | PRN
  Administered 2011-10-15 – 2011-10-17 (×8): 500 mg via ORAL
  Filled 2011-10-15 (×9): qty 1

## 2011-10-15 MED ORDER — ACETAMINOPHEN 10 MG/ML IV SOLN
INTRAVENOUS | Status: AC
  Filled 2011-10-15: qty 100

## 2011-10-15 MED ORDER — PROPOFOL 10 MG/ML IV EMUL
INTRAVENOUS | Status: DC | PRN
  Administered 2011-10-15: 200 mg via INTRAVENOUS

## 2011-10-15 MED ORDER — BUPIVACAINE ON-Q PAIN PUMP (FOR ORDER SET NO CHG)
INJECTION | Status: AC
  Filled 2011-10-15: qty 1

## 2011-10-15 MED ORDER — METOCLOPRAMIDE HCL 5 MG/ML IJ SOLN: 5.0000 mg | Freq: Three times a day (TID) | INTRAMUSCULAR | Status: DC | PRN

## 2011-10-15 MED ORDER — HYDROMORPHONE HCL PF 1 MG/ML IJ SOLN
0.2500 mg | INTRAMUSCULAR | Status: DC | PRN
  Administered 2011-10-15 (×4): 0.5 mg via INTRAVENOUS

## 2011-10-15 MED ORDER — ALUM & MAG HYDROXIDE-SIMETH 200-200-20 MG/5ML PO SUSP: 30.0000 mL | ORAL | Status: DC | PRN

## 2011-10-15 MED ORDER — BUPIVACAINE-EPINEPHRINE PF 0.5-1:200000 % IJ SOLN
INTRAMUSCULAR | Status: DC | PRN
  Administered 2011-10-15: 150 mg

## 2011-10-15 MED ORDER — MENTHOL 3 MG MT LOZG: 1.0000 | LOZENGE | OROMUCOSAL | Status: DC | PRN

## 2011-10-15 MED ORDER — FLUTICASONE PROPIONATE 50 MCG/ACT NA SUSP
2.0000 | Freq: Every day | NASAL | Status: DC
  Administered 2011-10-15 – 2011-10-18 (×3): 2 via NASAL
  Filled 2011-10-15 (×2): qty 16

## 2011-10-15 MED ORDER — OXYCODONE HCL 5 MG/5ML PO SOLN: 5.0000 mg | Freq: Once | ORAL | Status: DC | PRN

## 2011-10-15 MED ORDER — OXYCODONE HCL 10 MG PO TB12
10.0000 mg | ORAL_TABLET | Freq: Two times a day (BID) | ORAL | Status: DC
  Administered 2011-10-15 – 2011-10-17 (×5): 10 mg via ORAL
  Filled 2011-10-15 (×5): qty 1

## 2011-10-15 MED ORDER — ACETAMINOPHEN 650 MG RE SUPP: 650.0000 mg | Freq: Four times a day (QID) | RECTAL | Status: DC | PRN

## 2011-10-15 MED ORDER — OXYCODONE HCL 5 MG PO TABS: 5.0000 mg | ORAL_TABLET | Freq: Once | ORAL | Status: DC | PRN

## 2011-10-15 MED ORDER — IPRATROPIUM-ALBUTEROL 18-103 MCG/ACT IN AERO
2.0000 | INHALATION_SPRAY | Freq: Four times a day (QID) | RESPIRATORY_TRACT | Status: DC | PRN
  Filled 2011-10-15: qty 14.7

## 2011-10-15 MED ORDER — FLEET ENEMA 7-19 GM/118ML RE ENEM: 1.0000 | ENEMA | Freq: Once | RECTAL | Status: AC | PRN

## 2011-10-15 MED ORDER — SODIUM CHLORIDE 0.9 % IR SOLN
Status: DC | PRN
  Administered 2011-10-15: 3000 mL

## 2011-10-15 MED ORDER — DULOXETINE HCL 60 MG PO CPEP
90.0000 mg | ORAL_CAPSULE | Freq: Every day | ORAL | Status: DC
  Administered 2011-10-16 – 2011-10-18 (×3): 90 mg via ORAL
  Filled 2011-10-15 (×3): qty 1

## 2011-10-15 MED ORDER — PROMETHAZINE HCL 25 MG PO TABS
25.0000 mg | ORAL_TABLET | Freq: Four times a day (QID) | ORAL | Status: DC | PRN
  Administered 2011-10-18: 25 mg via ORAL
  Filled 2011-10-15: qty 1

## 2011-10-15 MED ORDER — ONDANSETRON HCL 4 MG PO TABS
4.0000 mg | ORAL_TABLET | Freq: Four times a day (QID) | ORAL | Status: DC | PRN
  Administered 2011-10-17: 4 mg via ORAL
  Filled 2011-10-15: qty 1

## 2011-10-15 MED ORDER — BISACODYL 5 MG PO TBEC: 5.0000 mg | DELAYED_RELEASE_TABLET | Freq: Every day | ORAL | Status: DC | PRN

## 2011-10-15 MED ORDER — ATORVASTATIN CALCIUM 40 MG PO TABS
40.0000 mg | ORAL_TABLET | Freq: Every day | ORAL | Status: DC
  Administered 2011-10-15 – 2011-10-17 (×3): 40 mg via ORAL
  Filled 2011-10-15 (×4): qty 1

## 2011-10-15 MED ORDER — DEXAMETHASONE SODIUM PHOSPHATE 4 MG/ML IJ SOLN
INTRAMUSCULAR | Status: DC | PRN
  Administered 2011-10-15: 4 mg

## 2011-10-15 MED ORDER — ONDANSETRON HCL 4 MG/2ML IJ SOLN: 4.0000 mg | Freq: Four times a day (QID) | INTRAMUSCULAR | Status: DC | PRN

## 2011-10-15 MED ORDER — DROPERIDOL 2.5 MG/ML IJ SOLN: 0.6250 mg | INTRAMUSCULAR | Status: DC | PRN

## 2011-10-15 MED ORDER — PHENOL 1.4 % MT LIQD: 1.0000 | OROMUCOSAL | Status: DC | PRN

## 2011-10-15 MED ORDER — DOCUSATE SODIUM 100 MG PO CAPS
100.0000 mg | ORAL_CAPSULE | Freq: Two times a day (BID) | ORAL | Status: DC
  Administered 2011-10-15 – 2011-10-18 (×7): 100 mg via ORAL
  Filled 2011-10-15 (×9): qty 1

## 2011-10-15 MED ORDER — METOCLOPRAMIDE HCL 10 MG PO TABS: 5.0000 mg | ORAL_TABLET | Freq: Three times a day (TID) | ORAL | Status: DC | PRN

## 2011-10-15 MED ORDER — CARBAMAZEPINE ER 400 MG PO TB12
400.0000 mg | ORAL_TABLET | Freq: Every day | ORAL | Status: DC
  Administered 2011-10-15 – 2011-10-17 (×3): 400 mg via ORAL
  Filled 2011-10-15 (×4): qty 1

## 2011-10-15 MED ORDER — DIPHENHYDRAMINE HCL 12.5 MG/5ML PO ELIX: 12.5000 mg | ORAL_SOLUTION | ORAL | Status: DC | PRN

## 2011-10-15 MED ORDER — ZOLPIDEM TARTRATE 5 MG PO TABS: 5.0000 mg | ORAL_TABLET | Freq: Every evening | ORAL | Status: DC | PRN

## 2011-10-15 MED ORDER — METOPROLOL SUCCINATE ER 100 MG PO TB24
200.0000 mg | ORAL_TABLET | Freq: Every day | ORAL | Status: DC
  Administered 2011-10-16 – 2011-10-18 (×3): 200 mg via ORAL
  Filled 2011-10-15 (×3): qty 2

## 2011-10-15 SURGICAL SUPPLY — 58 items
BANDAGE ESMARK 6X9 LF (GAUZE/BANDAGES/DRESSINGS) ×1 IMPLANT
BLADE SAGITTAL 13X1.27X60 (BLADE) ×2 IMPLANT
BLADE SAW SGTL 83.5X18.5 (BLADE) ×2 IMPLANT
BNDG ESMARK 6X9 LF (GAUZE/BANDAGES/DRESSINGS) ×2
BOWL SMART MIX CTS (DISPOSABLE) ×2 IMPLANT
CATH KIT ON Q 5IN SLV (PAIN MANAGEMENT) ×2 IMPLANT
CEMENT BONE SIMPLEX SPEEDSET (Cement) ×4 IMPLANT
CLOTH BEACON ORANGE TIMEOUT ST (SAFETY) ×2 IMPLANT
COVER BACK TABLE 24X17X13 BIG (DRAPES) IMPLANT
COVER SURGICAL LIGHT HANDLE (MISCELLANEOUS) ×2 IMPLANT
CUFF TOURNIQUET SINGLE 34IN LL (TOURNIQUET CUFF) ×2 IMPLANT
DRAPE EXTREMITY T 121X128X90 (DRAPE) ×2 IMPLANT
DRAPE INCISE IOBAN 66X45 STRL (DRAPES) ×4 IMPLANT
DRAPE PROXIMA HALF (DRAPES) ×2 IMPLANT
DRAPE U-SHAPE 47X51 STRL (DRAPES) ×2 IMPLANT
DRSG ADAPTIC 3X8 NADH LF (GAUZE/BANDAGES/DRESSINGS) ×2 IMPLANT
DRSG PAD ABDOMINAL 8X10 ST (GAUZE/BANDAGES/DRESSINGS) ×2 IMPLANT
DURAPREP 26ML APPLICATOR (WOUND CARE) ×4 IMPLANT
ELECT REM PT RETURN 9FT ADLT (ELECTROSURGICAL) ×2
ELECTRODE REM PT RTRN 9FT ADLT (ELECTROSURGICAL) ×1 IMPLANT
EVACUATOR 1/8 PVC DRAIN (DRAIN) ×2 IMPLANT
GLOVE BIOGEL M 7.0 STRL (GLOVE) IMPLANT
GLOVE BIOGEL PI IND STRL 7.5 (GLOVE) IMPLANT
GLOVE BIOGEL PI IND STRL 8.5 (GLOVE) ×1 IMPLANT
GLOVE BIOGEL PI INDICATOR 7.5 (GLOVE)
GLOVE BIOGEL PI INDICATOR 8.5 (GLOVE) ×1
GLOVE BIOGEL PI ORTHO PRO SZ8 (GLOVE) ×1
GLOVE PI ORTHO PRO STRL SZ8 (GLOVE) ×1 IMPLANT
GLOVE SURG ORTHO 8.0 STRL STRW (GLOVE) ×4 IMPLANT
GOWN PREVENTION PLUS XLARGE (GOWN DISPOSABLE) ×4 IMPLANT
GOWN STRL NON-REIN LRG LVL3 (GOWN DISPOSABLE) ×4 IMPLANT
HANDPIECE INTERPULSE COAX TIP (DISPOSABLE) ×1
HOOD PEEL AWAY FACE SHEILD DIS (HOOD) ×8 IMPLANT
KIT BASIN OR (CUSTOM PROCEDURE TRAY) ×2 IMPLANT
KIT ROOM TURNOVER OR (KITS) ×2 IMPLANT
LPS ARTI SURF CD 5-6 10MM (Orthopedic Implant) ×2 IMPLANT
MANIFOLD NEPTUNE II (INSTRUMENTS) ×2 IMPLANT
NEEDLE 22X1 1/2 (OR ONLY) (NEEDLE) IMPLANT
NS IRRIG 1000ML POUR BTL (IV SOLUTION) ×2 IMPLANT
PACK TOTAL JOINT (CUSTOM PROCEDURE TRAY) ×2 IMPLANT
PAD ARMBOARD 7.5X6 YLW CONV (MISCELLANEOUS) ×4 IMPLANT
PADDING CAST COTTON 6X4 STRL (CAST SUPPLIES) ×2 IMPLANT
POSITIONER HEAD PRONE TRACH (MISCELLANEOUS) ×2 IMPLANT
SET HNDPC FAN SPRY TIP SCT (DISPOSABLE) ×1 IMPLANT
SPONGE GAUZE 4X4 12PLY (GAUZE/BANDAGES/DRESSINGS) ×2 IMPLANT
STAPLER VISISTAT 35W (STAPLE) ×2 IMPLANT
SUCTION FRAZIER TIP 10 FR DISP (SUCTIONS) ×2 IMPLANT
SUT BONE WAX W31G (SUTURE) ×2 IMPLANT
SUT VIC AB 0 CTB1 27 (SUTURE) ×4 IMPLANT
SUT VIC AB 1 CT1 27 (SUTURE) ×2
SUT VIC AB 1 CT1 27XBRD ANBCTR (SUTURE) ×2 IMPLANT
SUT VIC AB 2-0 CT1 27 (SUTURE) ×2
SUT VIC AB 2-0 CT1 TAPERPNT 27 (SUTURE) ×2 IMPLANT
SYR CONTROL 10ML LL (SYRINGE) ×2 IMPLANT
TOWEL OR 17X24 6PK STRL BLUE (TOWEL DISPOSABLE) ×2 IMPLANT
TOWEL OR 17X26 10 PK STRL BLUE (TOWEL DISPOSABLE) ×2 IMPLANT
TRAY FOLEY CATH 14FR (SET/KITS/TRAYS/PACK) ×2 IMPLANT
WATER STERILE IRR 1000ML POUR (IV SOLUTION) ×6 IMPLANT

## 2011-10-15 NOTE — Progress Notes (Signed)
UR COMPLETED  

## 2011-10-15 NOTE — Anesthesia Preprocedure Evaluation (Signed)
Anesthesia Evaluation  Patient identified by MRN, date of birth, ID band Patient awake    Reviewed: Allergy & Precautions, H&P , NPO status , Patient's Chart, lab work & pertinent test results, reviewed documented beta blocker date and time   History of Anesthesia Complications Negative for: history of anesthetic complications  Airway Mallampati: I TM Distance: >3 FB     Dental  (+) Edentulous Upper and Dental Advisory Given   Pulmonary pneumonia -, resolved, Current Smoker,  breath sounds clear to auscultation        Cardiovascular hypertension, Rhythm:Regular Rate:Normal     Neuro/Psych  Headaches,    GI/Hepatic Neg liver ROS, PUD,   Endo/Other  Morbid obesity  Renal/GU negative Renal ROS     Musculoskeletal   Abdominal   Peds  Hematology   Anesthesia Other Findings   Reproductive/Obstetrics                           Anesthesia Physical Anesthesia Plan  ASA: III  Anesthesia Plan: General   Post-op Pain Management:    Induction:   Airway Management Planned: LMA  Additional Equipment:   Intra-op Plan:   Post-operative Plan: Extubation in OR  Informed Consent: I have reviewed the patients History and Physical, chart, labs and discussed the procedure including the risks, benefits and alternatives for the proposed anesthesia with the patient or authorized representative who has indicated his/her understanding and acceptance.   Dental advisory given  Plan Discussed with: CRNA, Anesthesiologist and Surgeon  Anesthesia Plan Comments:         Anesthesia Quick Evaluation

## 2011-10-15 NOTE — Preoperative (Signed)
Beta Blockers   Reason not to administer Beta Blockers:beta blocker taken this am 

## 2011-10-15 NOTE — Anesthesia Procedure Notes (Addendum)
Anesthesia Regional Block:  Femoral nerve block  Pre-Anesthetic Checklist: ,, timeout performed, Correct Patient, Correct Site, Correct Laterality, Correct Procedure,, site marked, risks and benefits discussed, Surgical consent,  Pre-op evaluation,  At surgeon's request and post-op pain management  Laterality: Left  Prep: chloraprep       Needles:  Injection technique: Single-shot  Needle Type: Echogenic Stimulator Needle     Needle Length: 5cm 5 cm Needle Gauge: 22 and 22 G    Additional Needles:  Procedures: ultrasound guided and nerve stimulator Femoral nerve block  Nerve Stimulator or Paresthesia:  Response: quadraceps contraction, 0.45 mA,   Additional Responses:   Narrative:  Start time: 10/15/2011 7:11 AM End time: 10/15/2011 7:20 AM Injection made incrementally with aspirations every 5 mL.  Performed by: Personally  Anesthesiologist: Halford Decamp, MD  Additional Notes: Functioning IV was confirmed and monitors were applied.  A 50mm 22ga Arrow echogenic stimulator needle was used. Sterile prep and drape,hand hygiene and sterile gloves were used. Ultrasound guidance: relevant anatomy identified, needle position confirmed, local anesthetic spread visualized around nerve(s)., vascular puncture avoided.  Image printed for medical record. Negative aspiration and negative test dose prior to incremental administration of local anesthetic. The patient tolerated the procedure well.    Femoral nerve block Procedure Name: LMA Insertion Date/Time: 10/15/2011 7:32 AM Performed by: Jerilee Hoh Pre-anesthesia Checklist: Patient identified, Emergency Drugs available, Suction available and Patient being monitored Patient Re-evaluated:Patient Re-evaluated prior to inductionOxygen Delivery Method: Circle system utilized Preoxygenation: Pre-oxygenation with 100% oxygen Intubation Type: IV induction LMA: LMA inserted LMA Size: 4.0 Tube type: Oral Number of attempts:  1 Placement Confirmation: positive ETCO2 and breath sounds checked- equal and bilateral Tube secured with: Tape Dental Injury: Teeth and Oropharynx as per pre-operative assessment

## 2011-10-15 NOTE — Transfer of Care (Signed)
Immediate Anesthesia Transfer of Care Note  Patient: Breanna Dixon  Procedure(s) Performed: Procedure(s) (LRB) with comments: TOTAL KNEE ARTHROPLASTY (Left)  Patient Location: PACU  Anesthesia Type: General  Level of Consciousness: awake, alert , oriented and patient cooperative  Airway & Oxygen Therapy: Patient Spontanous Breathing and Patient connected to nasal cannula oxygen  Post-op Assessment: Report given to PACU RN, Post -op Vital signs reviewed and stable and Patient moving all extremities  Post vital signs: Reviewed and stable  Complications: No apparent anesthesia complications

## 2011-10-15 NOTE — Progress Notes (Signed)
Patient reports that she has $5 with her staff did not witness same. Patient states she did not want it locked up. Patient notified we could not be responsible if lost

## 2011-10-15 NOTE — H&P (Signed)
Breanna Dixon MRN:  [id number] DOB/SEX:  Mar 02, 1945/female  CHIEF COMPLAINT:  Painful left Knee  HISTORY: Patient is a 66 y.o. female presented with a history of pain in the left knee. Onset of symptoms was gradual starting several years ago with gradually worsening course since that time. The patient noted no past surgery on the left knee. Prior procedures on the knee include meniscectomy. Patient has been treated conservatively with over-the-counter NSAIDs and activity modification. Patient currently rates pain in the knee at 10 out of 10 with activity. There is no pain at night.  PAST MEDICAL HISTORY: There are no active problems to display for this patient.  Past Medical History  Diagnosis Date  . Complication of anesthesia     woke up during previous surgery  . Cancer     breast hx  . Dysrhythmia     tachycardia  . Hypertension     take verapamil and toprol daily  . Bronchitis   . Seasonal allergies     flonase prn  . Pulmonary embolism 2006    hx of   . Blood transfusion 1974  . Ulcer, gastric, acute     hx of  . Fibromyalgia   . Mental disorder     hx of ptsd   . Lung mass April 2013    Right lower lobe lung mass  . Pneumonia     hx of.  5 x in life time.  [name] Head injury, closed, with concussion   . Kidney stone     hx of  . Headache     migranes, last one was in Jun 12, 2011  . Arthritis    Past Surgical History  Procedure Date  . Cholecystectomy 1994  . Back surgery 2000/2010    fusion  . Tonsillectomy 1967  . Appendectomy 1965  . Cystoscopy     multiple  . Urethral dilation 1971  . Breast surgery     bilateral partial masectomy  . Abdominal hysterectomy 1992  . Dilation and curettage of uterus   . Hernia repair   . Colonoscopy   . Knee arthroscopy   . Total knee arthroplasty 06/04/2011    Procedure: TOTAL KNEE ARTHROPLASTY;  Surgeon: Raymon Mutton, MD;  Location: [facility] OR;  Service: Orthopedics;  Laterality: Right;  . Vena cava filter placement  2006  . Incisional hernia repair   . Multiple tooth extractions   . Joint replacement      MEDICATIONS:   Prescriptions prior to admission  Medication Sig Dispense Refill  . albuterol-ipratropium (COMBIVENT) 18-103 MCG/ACT inhaler Inhale 2 puffs into the lungs every 6 (six) hours as needed. For shortness of breath      . aspirin 81 MG tablet Take 81 mg by mouth daily.      . Aspirin-Acetaminophen-Caffeine (EXCEDRIN MIGRAINE PO) Take by mouth as needed.      [name] atorvastatin (LIPITOR) 40 MG tablet Take 40 mg by mouth daily.      . carbamazepine (TEGRETOL XR) 200 MG 12 hr tablet Take 200-400 mg by mouth 2 (two) times daily. 200mg  in am and 400mg  at bedtime      . DULoxetine (CYMBALTA) 30 MG capsule Take 90 mg by mouth daily.      . fenofibrate micronized (LOFIBRA) 134 MG capsule Take 134 mg by mouth daily before breakfast.      . LORazepam (ATIVAN) 1 MG tablet Take 0.5-1 mg by mouth every 4 (four) hours as needed. For anxiety.      [name]  methocarbamol (ROBAXIN) 500 MG tablet Take 500-1,000 mg by mouth 3 (three) times daily as needed. For muscle pain      . metoprolol (TOPROL-XL) 200 MG 24 hr tablet Take 200 mg by mouth every morning.      . pregabalin (LYRICA) 75 MG capsule Take 75 mg by mouth 2 (two) times daily.      . promethazine (PHENERGAN) 25 MG tablet Take 25 mg by mouth every 6 (six) hours as needed. For nausea.      . verapamil (COVERA HS) 240 MG (CO) 24 hr tablet Take 240 mg by mouth at bedtime.      . fluticasone (FLONASE) 50 MCG/ACT nasal spray Place 2 sprays into the nose daily.        ALLERGIES:   Allergies  Allergen Reactions  . Imitrex (Sumatriptan Base) Nausea And Vomiting    Rapid heart beat.  [name] Ketorolac Tromethamine Nausea And Vomiting    Rapid heart beat.  . Stadol (Butorphanol Tartrate) Nausea And Vomiting  . Ivp Dye (Iodinated Diagnostic Agents) Nausea Only and Palpitations    Can tolerate iodine  . Shellfish Allergy Nausea Only and Palpitations    REVIEW OF  SYSTEMS:  Pertinent items are noted in HPI.   FAMILY HISTORY:   Family History  Problem Relation Age of Onset  . Adopted: Yes  . Anesthesia problems Neg Hx   . Hypotension Neg Hx   . Malignant hyperthermia Neg Hx   . Pseudochol deficiency Neg Hx     SOCIAL HISTORY:   History  Substance Use Topics  . Smoking status: Current Everyday Smoker -- 0.7 packs/day for 7 years    Types: Cigarettes  . Smokeless tobacco: Not on file  . Alcohol Use: No     EXAMINATION:  Vital signs in last 24 hours: Temp:  [98.7 F (37.1 C)] 98.7 F (37.1 C) (09/09 0611) Pulse Rate:  [77] 77  (09/09 0611) Resp:  [18] 18  (09/09 0611) BP: (149)/(69) 149/69 mmHg (09/09 0611) SpO2:  [97 %] 97 % (09/09 0611)  General appearance: alert, cooperative and no distress Lungs: clear to auscultation bilaterally Heart: regular rate and rhythm, S1, S2 normal, no murmur, click, rub or gallop Abdomen: soft, non-tender; bowel sounds normal; no masses,  no organomegaly Extremities: extremities normal, atraumatic, no cyanosis or edema and Homans sign is negative, no sign of DVT Pulses: 2+ and symmetric Skin: Skin color, texture, turgor normal. No rashes or lesions Neurologic: Alert and oriented X 3, normal strength and tone. Normal symmetric reflexes. Normal coordination and gait  Musculoskeletal:  ROM 0-110, Ligaments intact,  Imaging Review Plain radiographs demonstrate severe degenerative joint disease of the left knee. The overall alignment is mild varus. The bone quality appears to be good for age and reported activity level.  Assessment/Plan: End stage arthritis, left knee   The patient history, physical examination and imaging studies are consistent with advanced degenerative joint disease of the left knee. The patient has failed conservative treatment.  The clearance notes were reviewed.  After discussion with the patient it was felt that Total Knee Replacement was indicated. The procedure,  risks, and  benefits of total knee arthroplasty were presented and reviewed. The risks including but not limited to aseptic loosening, infection, blood clots, vascular injury, stiffness, patella tracking problems complications among others were discussed. The patient acknowledged the explanation, agreed to proceed with the plan.  Monzerat Handler 10/15/2011, 7:24 AM

## 2011-10-15 NOTE — Anesthesia Postprocedure Evaluation (Signed)
Anesthesia Post Note  Patient: Breanna Dixon  Procedure(s) Performed: Procedure(s) (LRB): TOTAL KNEE ARTHROPLASTY (Left)  Anesthesia type: general  Patient location: PACU  Post pain: Pain level controlled  Post assessment: Patient's Cardiovascular Status Stable  Last Vitals:  Filed Vitals:   10/15/11 0945  BP:   Pulse: 85  Temp:   Resp: 16    Post vital signs: Reviewed and stable  Level of consciousness: sedated  Complications: No apparent anesthesia complications

## 2011-10-15 NOTE — Progress Notes (Signed)
Patient asked if she could have a Nicotine patch. Education was done with her that Nicotine does not promote bone healing and growth; therefore, Breanna Dixon said she would go without a patch for as long as possible.

## 2011-10-15 NOTE — Progress Notes (Signed)
Orthopedic Tech Progress Note Patient Details:  Breanna Dixon 11-Sep-1945 [id number]  CPM Left Knee CPM Left Knee: On Left Knee Flexion (Degrees): 90  Left Knee Extension (Degrees): 0  Additional Comments: trapeze bar   Cammer, Mickie Bail 10/15/2011, 11:10 AM

## 2011-10-16 ENCOUNTER — Encounter (HOSPITAL_COMMUNITY): Payer: Self-pay | Admitting: Orthopedic Surgery

## 2011-10-16 DIAGNOSIS — M79609 Pain in unspecified limb: Secondary | ICD-10-CM

## 2011-10-16 LAB — CBC
HCT: 32.6 % — ABNORMAL LOW (ref 36.0–46.0)
Hemoglobin: 10.5 g/dL — ABNORMAL LOW (ref 12.0–15.0)
MCH: 28.5 pg (ref 26.0–34.0)
MCHC: 32.2 g/dL (ref 30.0–36.0)
RDW: 14.7 % (ref 11.5–15.5)
WBC: 15 10*3/uL — ABNORMAL HIGH (ref 4.0–10.5)

## 2011-10-16 LAB — BASIC METABOLIC PANEL
BUN: 11 mg/dL (ref 6–23)
Calcium: 8.7 mg/dL (ref 8.4–10.5)
Creatinine, Ser: 0.54 mg/dL (ref 0.50–1.10)
GFR calc Af Amer: >90 mL/min (ref >90)
GFR calc non Af Amer: >90 mL/min (ref >90)
Glucose, Bld: 133 mg/dL — ABNORMAL HIGH (ref 70–99)
Potassium: 3.7 mEq/L (ref 3.5–5.1)

## 2011-10-16 NOTE — Progress Notes (Signed)
Patient's Hemovac drain was pulled out while she was transferring to the wheelchair to go to Radiology this morning.

## 2011-10-16 NOTE — Progress Notes (Signed)
CARE MANAGEMENT NOTE 10/16/2011  Patient:  Breanna Dixon,Breanna Dixon   Account Number:  [id number]  Date Initiated:  10/16/2011  Documentation initiated by:  Amoria Mclees  Subjective/Objective Assessment:   66 yr old female s/p left total knee arthroplasty     Action/Plan:   CM spoke with patient regarding home health and DME needs at discharge. Choice offered. Preoperatively setup with [facility], no changes. Patient has rolling walker, 3in1 and CPM.   Anticipated DC Date:  10/17/2011   Anticipated DC Plan:  HOME W HOME HEALTH SERVICES      DC Planning Services  CM consult      PAC Choice  HOME HEALTH   Choice offered to / List presented to:  C-1 Patient        HH arranged  HH-2 PT      HH agency  [facility]   Status of service:  Completed, signed off Medicare Important Message given?   (If response is "NO", the following Medicare IM given date fields will be blank) Date Medicare IM given:   Date Additional Medicare IM given:    Discharge Disposition:  HOME W HOME HEALTH SERVICES  Per UR Regulation:    If discussed at Long Length of Stay Meetings, dates discussed:    Comments:     

## 2011-10-16 NOTE — Progress Notes (Signed)
[name], MD   [name], PA-C [address] [place], [place], [place]  [address]                             [phone]   PROGRESS NOTE  Subjective:  negative for Chest Pain  negative for Shortness of Breath  negative for Nausea/Vomiting   positive for Calf Pain  negative for Bowel Movement   Tolerating Diet: yes         Patient reports pain as 6 on 0-10 scale.    Objective: Vital signs in last 24 hours:   Patient Vitals for the past 24 hrs:  BP Temp Temp src Pulse Resp SpO2  10/16/11 0714 135/68 mmHg 98.7 F (37.1 C) - 87  18  99 %  10/16/11 0451 - - - - 18  98 %  10/16/11 0137 132/62 mmHg 98.1 F (36.7 C) Oral 89  18  95 %  10/16/11 0051 - - - - 18  95 %  10/15/11 2241 156/49 mmHg 97.8 F (36.6 C) - 84  18  97 %  10/15/11 1800 108/47 mmHg 97.9 F (36.6 C) Oral 84  18  97 %  10/15/11 1600 - - - - 18  -  10/15/11 1200 - - - - 16  93 %  10/15/11 1015 - - - 85  15  93 %  10/15/11 1000 - - - 85  14  92 %  10/15/11 0954 153/68 mmHg - - - - -  10/15/11 0945 - - - 85  16  94 %  10/15/11 0939 133/72 mmHg - - - - -  10/15/11 0930 - - - 86  15  93 %  10/15/11 0924 159/68 mmHg 98.7 F (37.1 C) - 86  18  93 %    @flow {1959:LAST@   Intake/Output from previous day:   09/09 0701 - 09/10 0700 In: 3060 [P.O.:960; I.V.:2100] Out: 1775 [Urine:1250; Drains:450]   Intake/Output this shift:       Intake/Output      09/09 0701 - 09/10 0700 09/10 0701 - 09/11 0700   P.O. 960    I.V. 2100    Total Intake 3060    Urine 1250    Drains 450    Blood 75    Total Output 1775    Net +1285            LABORATORY DATA:  Basename 10/16/11 0535 10/11/11 1516  WBC 15.0* 10.3  HGB 10.5* 13.7  HCT 32.6* 41.6  PLT 282 279    Basename 10/16/11 0535 10/11/11 1516  NA 138 140  K 3.7 4.6  CL 102 104  CO2 24 26  BUN 11 15  CREATININE 0.54 0.54  GLUCOSE 133* 109*  CALCIUM 8.7 9.5   Lab Results  Component Value Date   INR 1.01 10/11/2011   INR 0.98 05/28/2011     Examination:  General appearance: alert, cooperative and no distress Extremities: extremities normal, atraumatic, no cyanosis or edema and Homans sign is negative, no sign of DVT  Wound Exam: clean, dry, intact   Drainage:  Scant/small amount Serosanguinous exudate  Motor Exam: EHL and FHL Intact  Sensory Exam: Deep Peroneal normal  Vascular Exam:    Assessment:    1 Day Post-Op  Procedure(s) (LRB): TOTAL KNEE ARTHROPLASTY (Left)  ADDITIONAL DIAGNOSIS:  Active Problems:  * No active hospital problems. *   Acute Blood Loss Anemia  Plan: Physical Therapy as ordered Weight Bearing as Tolerated (WBAT)  DVT Prophylaxis:  Lovenox  DISCHARGE PLAN: Home  DISCHARGE NEEDS: HHPT, CPM, Walker and 3-in-1 comode seat         Hitomi Slape 10/16/2011, 7:55 AM

## 2011-10-16 NOTE — Progress Notes (Signed)
Left:  No evidence of DVT, superficial thrombosis, or Baker's cyst.  Right:  Negative for DVT in the common femoral vein.  

## 2011-10-16 NOTE — Progress Notes (Signed)
Physical Therapy Evaluation Patient Details Name: Breanna Dixon MRN: [id number] DOB: 28-Aug-1945 Today's Date: 10/16/2011 Time: 4696-2952 PT Time Calculation (min): 40 min  PT Assessment / Plan / Recommendation Clinical Impression  Pt is 66 y.o. F s/p L TKA. Pt will benefit from acute physical therapy services to increase strength, safety, and indepence through exercise and gait/stair training with the least restrictive device.    PT Assessment  Patient needs continued PT services    Follow Up Recommendations  Home health PT    Barriers to Discharge None      Equipment Recommendations  None recommended by PT    Recommendations for Other Services  (none)   Frequency 7X/week    Precautions / Restrictions Precautions Precautions: Knee Precaution Comments: L knee buckles due to block Restrictions Weight Bearing Restrictions: Yes LLE Weight Bearing: Weight bearing as tolerated   Pertinent Vitals/Pain 7/10 L LE      Mobility  Bed Mobility Bed Mobility: Sitting - Scoot to Edge of Bed;Supine to Sit Supine to Sit: 4: Min guard Sitting - Scoot to [place] of Bed: 4: Min guard Details for Bed Mobility Assistance: Cues for safety and line placement Transfers Transfers: Sit to Stand;Stand to Sit Sit to Stand: 3: Mod assist;With upper extremity assist;From bed Stand to Sit: 2: Max assist;With upper extremity assist;To chair/3-in-1 Details for Transfer Assistance: (A) to slow descent and cues for proper technique and safety Ambulation/Gait Ambulation/Gait Assistance: 2: Max assist Ambulation Distance (Feet): 5 Feet Assistive device: Rolling walker Ambulation/Gait Assistance Details:   (A) with manual and tactile cues for L LE and verbal cues for proper technique and safety.  Gait Pattern: Decreased stance time - left;Decreased step length - right;Step-to pattern Gait velocity: very slow Stairs: No    Exercises Total Joint Exercises Ankle Circles/Pumps: AROM;Both;10 reps Quad  Sets: AROM;Left;5 reps Heel Slides: AAROM;Left;5 reps Goniometric ROM: 27-97 left knee flexion   PT Diagnosis: Difficulty walking;Abnormality of gait;Acute pain  PT Problem List: Decreased strength;Decreased range of motion;Decreased activity tolerance;Decreased balance;Decreased mobility;Decreased safety awareness;Pain PT Treatment Interventions: Gait training;Stair training;Functional mobility training;Therapeutic activities;Therapeutic exercise;Balance training;Neuromuscular re-education;Patient/family education   PT Goals Acute Rehab PT Goals PT Goal Formulation: With patient Time For Goal Achievement: 10/23/11 Potential to Achieve Goals: Good Pt will go Supine/Side to Sit: with modified independence PT Goal: Supine/Side to Sit - Progress: Goal set today Pt will go Sit to Stand: with modified independence PT Goal: Sit to Stand - Progress: Goal set today Pt will go Stand to Sit: with modified independence PT Goal: Stand to Sit - Progress: Goal set today Pt will Ambulate: 51 - 150 feet PT Goal: Ambulate - Progress: Goal set today Pt will Go Up / Down Stairs: 1-2 stairs PT Goal: Up/Down Stairs - Progress: Goal set today Pt will Perform Home Exercise Program: Independently PT Goal: Perform Home Exercise Program - Progress: Goal set today  Visit Information  Last PT Received On: 10/16/11 Assistance Needed: +2    Subjective Data  Subjective: If the pain is to much I am not going to be wonder woman and perform the exercises Patient Stated Goal: To go home   Prior Functioning  Home Living Lives With: Family;Daughter;Son Available Help at Discharge: Family Type of Home: Apartment Home Access: Stairs to enter Secretary/administrator of Steps: 1 Entrance Stairs-Rails: Right Home Layout: One level Bathroom Shower/Tub: Forensic scientist: Standard Bathroom Accessibility: Yes How Accessible: Accessible via walker Home Adaptive Equipment: Bedside  commode/3-in-1;Shower chair with back;Straight cane;Walker - rolling  Prior Function Level of Independence: Independent with assistive device(s) Able to Take Stairs?: Yes (has help to go up the one step) Driving: Yes Vocation: On disability Communication Communication: No difficulties Dominant Hand: Left    Cognition  Overall Cognitive Status: Appears within functional limits for tasks assessed/performed Arousal/Alertness: Awake/alert Orientation Level: Appears intact for tasks assessed Behavior During Session: [facility] for tasks performed    Extremity/Trunk Assessment Right Upper Extremity Assessment RUE ROM/Strength/Tone: Within functional levels RUE Sensation: WFL - Light Touch;WFL - Proprioception RUE Coordination: WFL - gross/fine motor Left Upper Extremity Assessment LUE ROM/Strength/Tone: Within functional levels LUE Sensation: WFL - Light Touch;WFL - Proprioception LUE Coordination: WFL - gross/fine motor Left Lower Extremity Assessment LLE ROM/Strength/Tone: Unable to fully assess;Due to pain Trunk Assessment Trunk Assessment: Normal (history of back surgery - lumbar fusion)   Balance    End of Session PT - End of Session Equipment Utilized During Treatment: Gait belt Activity Tolerance: Patient limited by pain Patient left: in chair;with call bell/phone within reach Nurse Communication: Mobility status CPM Left Knee CPM Left Knee: Off  GP     Zaydyn Havey 10/16/2011, 2:36 PM [place], PT DPT [phone]

## 2011-10-16 NOTE — Progress Notes (Signed)
Physical Therapy Treatment Patient Details Name: Breanna Dixon MRN: [id number] DOB: 01-30-1946 Today's Date: 10/16/2011 Time: 4132-4401 PT Time Calculation (min): 28 min  PT Assessment / Plan / Recommendation Comments on Treatment Session  Pt complained of more pain and fatigue this afternoon and was not wanting to due much during therapy.  Pt left on CPM 0-90 degrees.    Follow Up Recommendations  Home health PT    Barriers to Discharge None      Equipment Recommendations  None recommended by PT    Recommendations for Other Services  (None)  Frequency 7X/week   Plan Frequency remains appropriate;Discharge plan remains appropriate    Precautions / Restrictions Precautions Precautions: Knee Precaution Comments: L knee buckles due to block Restrictions Weight Bearing Restrictions: Yes LLE Weight Bearing: Weight bearing as tolerated   Pertinent Vitals/Pain 9/10 L LE    Mobility  Bed Mobility Bed Mobility: Supine to Sit;Sitting - Scoot to [place] of Bed;Sit to Supine Supine to Sit: 4: Min guard Sitting - Scoot to [place] of Bed: 4: Min guard Sit to Supine: 4: Min guard Details for Bed Mobility Assistance: Cues for safety and line placement Transfers Transfers: Sit to Stand;Stand to Sit Sit to Stand: 3: Mod assist;With upper extremity assist;From bed Stand to Sit: 2: Max assist;With upper extremity assist;To chair/3-in-1 Details for Transfer Assistance: (A) with proper descent to bed and max cueing for safety and hand placement Ambulation/Gait Ambulation/Gait Assistance: 4: Min assist Ambulation Distance (Feet): 10 Feet Assistive device: Rolling walker Ambulation/Gait Assistance Details: (A) with tactile cues to prevent knee buckling, proper placement of walker and hands, safety and foot placement.  Pt very impulsive and needs max cues to slow down and complete appropriate step seqence.  Pt attempts to sit without proper placement Gait Pattern: Decreased stance time -  left;Decreased step length - right;Step-to pattern Gait velocity: very slow Stairs: No    Exercises NONE   PT Diagnosis: Difficulty walking;Abnormality of gait;Acute pain  PT Problem List: Decreased strength;Decreased range of motion;Decreased activity tolerance;Decreased balance;Decreased mobility;Decreased safety awareness;Pain PT Treatment Interventions: Gait training;Stair training;Functional mobility training;Therapeutic activities;Therapeutic exercise;Balance training;Neuromuscular re-education;Patient/family education   PT Goals Acute Rehab PT Goals PT Goal Formulation: With patient Time For Goal Achievement: 10/23/11 Potential to Achieve Goals: Good Pt will go Supine/Side to Sit: with modified independence PT Goal: Supine/Side to Sit - Progress: Progressing toward goal Pt will go Sit to Supine/Side: with modified independence PT Goal: Sit to Supine/Side - Progress: Progressing toward goal Pt will go Sit to Stand: with modified independence PT Goal: Sit to Stand - Progress: Progressing toward goal Pt will go Stand to Sit: with modified independence PT Goal: Stand to Sit - Progress: Progressing toward goal Pt will Ambulate: 51 - 150 feet PT Goal: Ambulate - Progress: Progressing toward goal Pt will Go Up / Down Stairs: 1-2 stairs PT Goal: Up/Down Stairs - Progress: Goal set today Pt will Perform Home Exercise Program: Independently PT Goal: Perform Home Exercise Program - Progress: Goal set today  Visit Information  Last PT Received On: 10/16/11 Assistance Needed: +2    Subjective Data  Subjective: The pain has gotten worse and I was sleeping when you came in. Patient Stated Goal: To go home tomorrow   Cognition  Overall Cognitive Status: Appears within functional limits for tasks assessed/performed Arousal/Alertness: Awake/alert Orientation Level: Appears intact for tasks assessed Behavior During Session: [facility] for tasks performed    Balance  Balance Balance  Assessed: No  End of Session PT -  End of Session Equipment Utilized During Treatment: Gait belt Activity Tolerance: Patient limited by fatigue;Patient limited by pain Patient left: in bed;with call bell/phone within reach Nurse Communication: Mobility status;Patient requests pain meds CPM Left Knee CPM Left Knee: Off   GP     W[place]ona Wargo 10/16/2011, 4:12 PM [place], PT DPT [phone]

## 2011-10-17 LAB — CBC
Hemoglobin: 9.5 g/dL — ABNORMAL LOW (ref 12.0–15.0)
MCH: 28.1 pg (ref 26.0–34.0)
MCHC: 32.5 g/dL (ref 30.0–36.0)
Platelets: 191 10*3/uL (ref 150–400)
RDW: 14.5 % (ref 11.5–15.5)

## 2011-10-17 LAB — BASIC METABOLIC PANEL
Calcium: 8.8 mg/dL (ref 8.4–10.5)
GFR calc Af Amer: >90 mL/min (ref >90)
GFR calc non Af Amer: >90 mL/min (ref >90)
Glucose, Bld: 144 mg/dL — ABNORMAL HIGH (ref 70–99)
Potassium: 3.5 mEq/L (ref 3.5–5.1)
Sodium: 138 mEq/L (ref 135–145)

## 2011-10-17 MED ORDER — HYDROMORPHONE HCL PF 1 MG/ML IJ SOLN: 0.5000 mg | INTRAMUSCULAR | Status: DC | PRN

## 2011-10-17 MED ORDER — HYDROCODONE-ACETAMINOPHEN 10-325 MG PO TABS
1.0000 | ORAL_TABLET | ORAL | Status: DC | PRN
  Administered 2011-10-17 – 2011-10-18 (×5): 2 via ORAL
  Filled 2011-10-17 (×5): qty 2

## 2011-10-17 NOTE — Progress Notes (Signed)
Physical Therapy Treatment Patient Details Name: Breanna Dixon MRN: [id number] DOB: 01/21/1946 Today's Date: 10/17/2011 Time: 4098-1191 PT Time Calculation (min): 42 min  PT Assessment / Plan / Recommendation Comments on Treatment Session  Pt limited due to pain, low endurance, and fatigue level.  Pt had decreased safety awareness/judgement and was very impulsive.  Pt reported having a migraine with oras and feeling nauseated.  Nurse was consulted.    Follow Up Recommendations  Home health PT    Barriers to Discharge        Equipment Recommendations  None recommended by PT    Recommendations for Other Services Other (comment) (None)  Frequency 7X/week   Plan Frequency remains appropriate;Discharge plan remains appropriate    Precautions / Restrictions Precautions Precautions: Knee Precaution Comments: L knee still unstable potential buckling Restrictions Weight Bearing Restrictions: Yes LLE Weight Bearing: Weight bearing as tolerated   Pertinent Vitals/Pain 4/10    Mobility  Bed Mobility Bed Mobility: Supine to Sit Supine to Sit: 4: Min assist Details for Bed Mobility Assistance: (A) required with lifting L LE OOB.  Max cues needed for proper technique, safety, and hand placement. Transfers Transfers: Sit to Stand;Stand to Sit Sit to Stand: 4: Min assist;From bed Stand to Sit: 2: Max assist;With upper extremity assist;To chair/3-in-1 Details for Transfer Assistance: (A) tp help maintain balance and safety.  Max cues for hand placement and proper technique. Ambulation/Gait Ambulation/Gait Assistance: 4: Min assist Ambulation Distance (Feet): 20 Feet Assistive device: Rolling walker Ambulation/Gait Assistance Details: (A) for proper placement of walker, hands, and feet.  Pt very impulsive and required max cueing for hand placement, safety judgement, step sequence, and to slow down.  Pt attemps to sit on toilet and bed without proper placement and safety. Gait Pattern:  Decreased stance time - left;Decreased step length - right;Step-to pattern Gait velocity: very slow Stairs: Yes Stairs Assistance: 3: Mod assist Stairs Assistance Details (indicate cue type and reason): (A) with proper walker placement, foot placement, and hand placement.  Max cues for proper technique, balance, and safety. Stair Management Technique: Backwards;No rails;With walker Number of Stairs: 1     Exercises Total Joint Exercises Ankle Circles/Pumps: AROM;Both;5 reps Quad Sets: 5 reps;Left;AAROM Heel Slides: AAROM;Left (2 reps)   PT Diagnosis:    PT Problem List:   PT Treatment Interventions:     PT Goals Acute Rehab PT Goals PT Goal Formulation: With patient Time For Goal Achievement: 10/23/11 Potential to Achieve Goals: Good Pt will go Supine/Side to Sit: with modified independence PT Goal: Supine/Side to Sit - Progress: Progressing toward goal Pt will go Sit to Supine/Side: with modified independence PT Goal: Sit to Supine/Side - Progress: Progressing toward goal Pt will go Sit to Stand: with modified independence PT Goal: Sit to Stand - Progress: Progressing toward goal Pt will go Stand to Sit: with modified independence PT Goal: Stand to Sit - Progress: Progressing toward goal Pt will Ambulate: 51 - 150 feet PT Goal: Ambulate - Progress: Progressing toward goal Pt will Go Up / Down Stairs: 1-2 stairs;with min assist;with rolling walker PT Goal: Up/Down Stairs - Progress: Progressing toward goal Pt will Perform Home Exercise Program: Independently PT Goal: Perform Home Exercise Program - Progress: Progressing toward goal  Visit Information  Last PT Received On: 10/17/11 Assistance Needed: +2    Subjective Data  Subjective: Not feeling too good today.  Have a terrible migraine. Patient Stated Goal: To go home and sleep.   Cognition  Overall Cognitive Status: Impaired  Area of Impairment: Attention;Following commands;Safety/judgement Arousal/Alertness:  Lethargic Orientation Level: Appears intact for tasks assessed Behavior During Session: Lethargic Current Attention Level: Sustained Attention - Other Comments: Pt could not follow exercise commands and needed redirection often.  Pt also very impulsive. Following Commands: Follows one step commands inconsistently;Follows multi-step commands inconsistently Safety/Judgement: Decreased awareness of safety precautions;Decreased safety judgement for tasks assessed;Impulsive Safety/Judgement - Other Comments: Pt was impulsive to get back into bed and was unaware of  the distance between her and bad.  Pt also impulsive to leave walker in bathroom and ambulate towards bed.    Balance     End of Session PT - End of Session Equipment Utilized During Treatment: Gait belt Activity Tolerance: Patient limited by pain;Patient limited by fatigue Patient left: in bed;with call bell/phone within reach;Other (comment) (CPM 0-90) Nurse Communication: Mobility status   GP     Candace Begue 10/17/2011, 10:27 AM

## 2011-10-17 NOTE — Op Note (Signed)
TOTAL KNEE REPLACEMENT OPERATIVE NOTE:  10/15/2011  7:19 AM  PATIENT:  Breanna Dixon  66 y.o. female  PRE-OPERATIVE DIAGNOSIS:  osteoarthritis left knee  POST-OPERATIVE DIAGNOSIS:  osteoarthritis left knee  PROCEDURE:  Procedure(s): TOTAL KNEE ARTHROPLASTY  SURGEON:  Surgeon(s): Raymon Mutton, MD  PHYSICIAN ASSISTANT: [name], [facility]  ANESTHESIA:   general  DRAINS: Hemovac and On-Q Marcaine Pain Pump  SPECIMEN: None  COUNTS:  Correct  TOURNIQUET:   Total Tourniquet Time Documented: Thigh (Left) - 60 minutes  DICTATION:  Indication for procedure:    The patient is a 66 y.o. female who has failed conservative treatment for osteoarthritis left knee.  Informed consent was obtained prior to anesthesia. The risks versus benefits of the operation were explain and in a way the patient can, and did, understand.   Description of procedure:     The patient was taken to the operating room and placed under anesthesia.  The patient was positioned in the usual fashion taking care that all body parts were adequately padded and/or protected.  I foley catheter was placed.  A tourniquet was applied and the leg prepped and draped in the usual sterile fashion.  The extremity was exsanguinated with the esmarch and tourniquet inflated to 350 mmHg.  Pre-operative range of motion was normal.  The knee was in 5 degree of mild valgus.  A midline incision approximately 6-7 inches long was made with a #10 blade.  A new blade was used to make a parapatellar arthrotomy going 2-3 cm into the quadriceps tendon, over the patella, and alongside the medial aspect of the patellar tendon.  A synovectomy was then performed with the #10 blade and forceps. I then elevated the deep MCL off the medial tibial metaphysis subperiosteally around to the semimembranosus attachment.    I everted the patella and used calipers to measure patellar thickness.  I used the reamer to ream down to appropriate thickness to  recreate the native thickness.  I then removed excess bone with the rongeur and sagittal saw.  I used the appropriately sized template and drilled the three lug holes.  I then put the trial in place and measured the thickness with the calipers to ensure recreation of the native thickness.  The trial was then removed and the patella subluxed and the knee brought into flexion.  A homan retractor was place to retract and protect the patella and lateral structures.  A Z-retractor was place medially to protect the medial structures.  The extra-medullary alignment system was used to make cut the tibial articular surface perpendicular to the anamotic axis of the tibia and in 3 degrees of posterior slope.  The cut surface and alignment jig was removed.  I then used the intramedullary alignment guide to make a 4 valgus cut on the distal femur.  I then marked out the epicondylar axis on the distal femur.  The posterior condylar axis measured 3 degrees.  I then used the anterior referencing sizer and measured the femur to be a size D.  The 4-In-1 cutting block was screwed into place in external rotation matching the posterior condylar angle, making our cuts perpendicular to the epicondylar axis.  Anterior, posterior and chamfer cuts were made with the sagittal saw.  The cutting block and cut pieces were removed.  A lamina spreader was placed in 90 degrees of flexion.  The ACL, PCL, menisci, and posterior condylar osteophytes were removed.  A 10 mm spacer blocked was found to offer good flexion  and extension gap balance after mild in degree releasing.   The scoop retractor was then placed and the femoral finishing block was pinned in place.  The small sagittal saw was used as well as the lug drill to finish the femur.  The block and cut surfaces were removed and the medullary canal hole filled with autograft bone from the cut pieces.  The tibia was delivered forward in deep flexion and external rotation.  A size 4 tray  was selected and pinned into place centered on the medial 1/3 of the tibial tubercle.  The reamer and keel was used to prepare the tibia through the tray.    I then trialed with the size D femur, size 4 tibia, a 10 mm insert and the 32 patella.  I had excellent flexion/extension gap balance, excellent patella tracking.  Flexion was full and beyond 120 degrees; extension was zero.  These components were chosen and the staff opened them to me on the back table while the knee was lavaged copiously and the cement mixed.  I cemented in the components and removed all excess cement.  The polyethylene tibial component was snapped into place and the knee placed in extension while cement was hardening.  The capsule was infilltrated with 20cc of .25% Marcaine with epinephrine.  A hemovac was place in the joint exiting superolaterally.  A pain pump was place superomedially superficial to the arthrotomy.  Once the cement was hard, the tourniquet was let down.  Hemostasis was obtained.  The arthrotomy was closed with figure-8 #1 vicryl sutures.  The deep soft tissues were closed with #0 vicryls and the subcuticular layer closed with a running #2-0 vicryl.  The skin was reapproximated and closed with skin staples.  The wound was dressed with xeroform, 4 x4's, 2 ABD sponges, a single layer of webril and a TED stocking.   The patient was then awakened, extubated, and taken to the recovery room in stable condition.  BLOOD LOSS:  300cc DRAINS: 1 hemovac, 1 pain catheter COMPLICATIONS:  None.  PLAN OF CARE: Admit to inpatient   PATIENT DISPOSITION:  PACU - hemodynamically stable.   Delay start of Pharmacological VTE agent (>24hrs) due to surgical blood loss or risk of bleeding:  not applicable  Please fax a copy of this op note to my office at [phone] (please only include page 1 and 2 of the Case Information op note)

## 2011-10-17 NOTE — Progress Notes (Signed)
Occupational Therapy Treatment Patient Details Name: Breanna Dixon MRN: [id number] DOB: 1945/10/31 Today's Date: 10/17/2011 Time: 4098-1191 OT Time Calculation (min): 23 min  OT Assessment / Plan / Recommendation Comments on Treatment Session Pt unsafe this am. Rec that pt have HHOT due to slow progress.    Follow Up Recommendations  Home health OT    Barriers to Discharge   none    Equipment Recommendations  None recommended by OT    Recommendations for Other Services  none  Frequency Min 3X/week   Plan Discharge plan needs to be updated    Precautions / Restrictions Precautions Precautions: Knee;Other (comment) (impulsive and unsafe behavior apparently due to medication) Precaution Comments: L knee still unstable potential buckling Restrictions Weight Bearing Restrictions: Yes LLE Weight Bearing: Weight bearing as tolerated   Pertinent Vitals/Pain 5    ADL  Toilet Transfer: Simulated;Minimal assistance Toilet Transfer Method: Sit to stand;Stand pivot Toilet Transfer Equipment: Bedside commode Transfers/Ambulation Related to ADLs: MIn a RW level but unsafe - leaving walker, not demonstrating safe use of RW. Given AE for ADL ADL Comments: Has all nec AE for LB ADL. will assess with tub bench    OT Diagnosis:    OT Problem List:   OT Treatment Interventions:     OT Goals Acute Rehab OT Goals OT Goal Formulation: With patient Time For Goal Achievement: 10/23/11 Potential to Achieve Goals: Good ADL Goals Pt Will Transfer to Toilet: with supervision;with caregiver independent in assisting;Ambulation;with DME;3-in-1 ADL Goal: Toilet Transfer - Progress: Progressing toward goals Pt Will Perform Toileting - Clothing Manipulation: with modified independence;Standing ADL Goal: Toileting - Clothing Manipulation - Progress: Progressing toward goals Pt Will Perform Toileting - Hygiene: with modified independence;Standing at 3-in-1/toilet ADL Goal: Toileting - Hygiene -  Progress: Progressing toward goals Pt Will Perform Tub/Shower Transfer: Tub transfer;with min assist;Ambulation;with DME;Transfer tub bench ADL Goal: Tub/Shower Transfer - Progress: Not progressing  Visit Information  Last OT Received On: 10/17/11 Assistance Needed: +1    Subjective Data      Prior Functioning       Cognition  Overall Cognitive Status: Impaired Area of Impairment: Safety/judgement;Awareness of deficits;Awareness of errors;Problem solving;Executive functioning Arousal/Alertness: Lethargic Orientation Level: Appears intact for tasks assessed Behavior During Session: Lethargic Current Attention Level: Sustained Attention - Other Comments: Difficulty maintaining attention this am Following Commands: Follows one step commands inconsistently Safety/Judgement: Decreased awareness of safety precautions;Decreased safety judgement for tasks assessed;Decreased awareness of need for assistance Safety/Judgement - Other Comments: impulsivity    Mobility  Shoulder Instructions Bed Mobility Bed Mobility: Sit to Supine Supine to Sit: 5: Supervision;HOB flat Details for Bed Mobility Assistance: used RLE to lift LLE onto bed Transfers Sit to Stand: 4: Min assist;From bed Stand to Sit: 2: Max assist;With upper extremity assist;To chair/3-in-1 Details for Transfer Assistance: (A) tp help maintain balance and safety.  Max cues for hand placement and proper technique.       Exercises  Total Joint Exercises Ankle Circles/Pumps: AROM;Both;5 reps Quad Sets: 5 reps;Left;AAROM Heel Slides: AAROM;Left (2 reps)   Balance  Min A   End of Session OT - End of Session Equipment Utilized During Treatment: Gait belt Activity Tolerance: Treatment limited secondary to medication Patient left: in bed;with call bell/phone within reach Nurse Communication: Mobility status;Other (comment) (cognition)  GO     Timmya Blazier,[name] 10/17/2011, 10:39 AM Luisa Dago, OTR/L  [phone] 10/17/2011

## 2011-10-17 NOTE — Progress Notes (Signed)
  [name], MD   [name], PA-C [address] [place], [place], [place]  [address]                             [phone]   PROGRESS NOTE  Subjective:  negative for Chest Pain  negative for Shortness of Breath  negative for Nausea/Vomiting   negative for Calf Pain  negative for Bowel Movement   Tolerating Diet: yes         Patient reports pain as 5 on 0-10 scale.    Objective: Vital signs in last 24 hours:   Patient Vitals for the past 24 hrs:  BP Temp Pulse Resp SpO2  10/17/11 0800 - - - - 20 %  10/17/11 0553 138/51 mmHg 99.6 F (37.6 C) 93  18  95 %  10/16/11 2118 138/55 mmHg 100 F (37.8 C) 104  18  95 %  10/16/11 1600 - - - 16  -  10/16/11 1300 117/43 mmHg 99.3 F (37.4 C) 82  18  94 %    @flow {1959:LAST@   Intake/Output from previous day:   09/10 0701 - 09/11 0700 In: 1640 [P.O.:960; I.V.:680] Out: 50 [Drains:50]   Intake/Output this shift:       Intake/Output      09/10 0701 - 09/11 0700 09/11 0701 - 09/12 0700   P.O. 960    I.V. 680    Total Intake 1640    Urine 0    Drains 50    Blood     Total Output 50    Net +1590         Urine Occurrence 9 x 1 x      LABORATORY DATA:  Basename 10/17/11 0646 10/16/11 0535 10/11/11 1516  WBC 12.2* 15.0* 10.3  HGB 9.5* 10.5* 13.7  HCT 29.2* 32.6* 41.6  PLT 191 282 279    Basename 10/17/11 0646 10/16/11 0535 10/11/11 1516  NA 138 138 140  K 3.5 3.7 4.6  CL 103 102 104  CO2 26 24 26   BUN 7 11 15   CREATININE 0.55 0.54 0.54  GLUCOSE 144* 133* 109*  CALCIUM 8.8 8.7 9.5   Lab Results  Component Value Date   INR 1.01 10/11/2011   INR 0.98 05/28/2011    Examination:  General appearance: alert, cooperative and no distress Extremities: Homans sign is negative, no sign of DVT  Wound Exam: clean, dry, intact   Drainage:  None: wound tissue dry  Motor Exam: EHL and FHL Intact  Sensory Exam: Deep Peroneal normal  Vascular Exam:    Assessment:    2 Days Post-Op  Procedure(s)  (LRB): TOTAL KNEE ARTHROPLASTY (Left)  ADDITIONAL DIAGNOSIS:  Active Problems:  * No active hospital problems. *   Acute Blood Loss Anemia   Plan: Physical Therapy as ordered Weight Bearing as Tolerated (WBAT)  DVT Prophylaxis:  Lovenox  DISCHARGE PLAN: Home  DISCHARGE NEEDS: HHPT, CPM, Walker and 3-in-1 comode seat         Alahna Dunne 10/17/2011, 12:44 PM

## 2011-10-17 NOTE — Progress Notes (Signed)
Agree with PT treatment note.  Asar Evilsizer, PT DPT [phone]  

## 2011-10-18 LAB — CBC
Hemoglobin: 8.9 g/dL — ABNORMAL LOW (ref 12.0–15.0)
MCH: 28.7 pg (ref 26.0–34.0)
Platelets: 191 10*3/uL (ref 150–400)
RBC: 3.1 MIL/uL — ABNORMAL LOW (ref 3.87–5.11)
WBC: 9.3 10*3/uL (ref 4.0–10.5)

## 2011-10-18 LAB — BASIC METABOLIC PANEL
CO2: 27 mEq/L (ref 19–32)
Calcium: 8.8 mg/dL (ref 8.4–10.5)
GFR calc non Af Amer: >90 mL/min (ref >90)
Glucose, Bld: 128 mg/dL — ABNORMAL HIGH (ref 70–99)
Potassium: 3.4 mEq/L — ABNORMAL LOW (ref 3.5–5.1)
Sodium: 140 mEq/L (ref 135–145)

## 2011-10-18 MED ORDER — HYDROCODONE-ACETAMINOPHEN 10-325 MG PO TABS: 1.0000 | ORAL_TABLET | ORAL | Status: DC | PRN

## 2011-10-18 MED ORDER — METHOCARBAMOL 500 MG PO TABS: 500.0000 mg | ORAL_TABLET | Freq: Four times a day (QID) | ORAL | Status: AC | PRN

## 2011-10-18 MED ORDER — ENOXAPARIN SODIUM 40 MG/0.4ML ~~LOC~~ SOLN: 40.0000 mg | Freq: Every day | SUBCUTANEOUS | Status: DC

## 2011-10-18 NOTE — Discharge Summary (Signed)
Breanna Spurling, MD   Breanna Cabal, PA-C [address] [place], [place], [place]  [address]                             [phone]  PATIENT ID: Breanna Dixon        MRN:  [id number]          DOB/AGE: Sep 07, 1945 / 66 y.o.    DISCHARGE SUMMARY  ADMISSION DATE:    10/15/2011 DISCHARGE DATE:   10/18/2011   ADMISSION DIAGNOSIS: osteoarthritis left kneet    DISCHARGE DIAGNOSIS:  osteoarthritis left knee    ADDITIONAL DIAGNOSIS: Active Problems:  * No active hospital problems. *   Past Medical History  Diagnosis Date  . Complication of anesthesia     woke up during previous surgery  . Cancer     breast hx  . Dysrhythmia     tachycardia  . Hypertension     take verapamil and toprol daily  . Bronchitis   . Seasonal allergies     flonase prn  . Pulmonary embolism 2006    hx of   . Blood transfusion 1974  . Ulcer, gastric, acute     hx of  . Fibromyalgia   . Mental disorder     hx of ptsd   . Lung mass April 2013    Right lower lobe lung mass  . Pneumonia     hx of.  5 x in life time.  [name] Head injury, closed, with concussion   . Kidney stone     hx of  . Headache     migranes, last one was in Jun 12, 2011  . Arthritis     PROCEDURE: Procedure(s): TOTAL KNEE ARTHROPLASTY on 10/15/2011  CONSULTS:     HISTORY:  See H&P in chart  HOSPITAL COURSE:  Breanna Dixon is a 66 y.o. admitted on 10/15/2011 and found to have a diagnosis of osteoarthritis left knee.  After appropriate laboratory studies were obtained  they were taken to the operating room on 10/15/2011 and underwent Procedure(s): TOTAL KNEE ARTHROPLASTY.   They were given perioperative antibiotics:  Anti-infectives     Start     Dose/Rate Route Frequency Ordered Stop   10/15/11 1400   ceFAZolin (ANCEF) IVPB 2 g/50 mL premix        2 g 100 mL/hr over 30 Minutes Intravenous Every 6 hours 10/15/11 1059 10/15/11 2038   10/14/11 1530   ceFAZolin (ANCEF) IVPB 2 g/50 mL premix        2 g 100 mL/hr over 30 Minutes  Intravenous 60 min pre-op 10/14/11 1530 10/15/11 0739        .  Tolerated the procedure well.  Placed with a foley intraoperatively.  Given Ofirmev at induction and for 48 hours.    POD #1, allowed out of bed to a chair.  PT for ambulation and exercise program.  Foley D/C'd in morning.  IV saline locked.  O2 discontionued.  POD #2, continued PT and ambulation.   Hemovac pulled. .  The remainder of the hospital course was dedicated to ambulation and strengthening.   The patient was discharged on 3 Days Post-Op in  Good condition.  Blood products given:none  DIAGNOSTIC STUDIES: Recent vital signs: Patient Vitals for the past 24 hrs:  BP Temp Pulse Resp SpO2  10/18/11 0624 127/59 mmHg 99.2 F (37.3 C) 89  18  98 %  10/18/11 0000 - - -  18  97 %  10-31-11 2200 135/43 mmHg 99.6 F (37.6 C) 95  18  97 %  10/31/11 2000 - - - 18  97 %  10/31/11 1600 - - - 16  96 %  10-31-11 1430 133/47 mmHg 99.9 F (37.7 C) 89  16  93 %       Recent laboratory studies:  Basename 10/18/11 0645 2011/10/31 0646 10/16/11 0535 10/11/11 1516  WBC 9.3 12.2* 15.0* 10.3  HGB 8.9* 9.5* 10.5* 13.7  HCT 26.8* 29.2* 32.6* 41.6  PLT 191 191 282 279    Basename 10/18/11 0645 Oct 31, 2011 0646 10/16/11 0535 10/11/11 1516  NA 140 138 138 140  K 3.4* 3.5 3.7 4.6  CL 103 103 102 104  CO2 27 26 24 26   BUN 8 7 11 15   CREATININE 0.53 0.55 0.54 0.54  GLUCOSE 128* 144* 133* 109*  CALCIUM 8.8 8.8 8.7 9.5   Lab Results  Component Value Date   INR 1.01 10/11/2011   INR 0.98 05/28/2011     Recent Radiographic Studies :  Dg Chest 2 View  10/11/2011  *RADIOLOGY REPORT*  Clinical Data: Preoperative respiratory films.  CHEST - 2 VIEW  Comparison: Plain films of the chest 05/28/2011 and CT chest 05/30/2011.  Findings: Right lower lobe pulmonary nodule seen on the comparison studies is again identified measuring 1.8 cm, unchanged.  The lungs are otherwise clear.  Heart size is normal.  No pneumothorax or pleural fluid.   IMPRESSION: No acute finding.  No change in a right lower lobe pulmonary nodule. Follow up is recommended as described in report of chest CT.   Original Report Authenticated By: Bernadene Bell. D'ALESSIO, M.D.     DISCHARGE INSTRUCTIONS: Discharge Orders    Future Orders Please Complete By Expires   Diet - low sodium heart healthy      Call MD / Call 911      Comments:   If you experience chest pain or shortness of breath, CALL 911 and be transported to the hospital emergency room.  If you develope a fever above 101 F, pus (white drainage) or increased drainage or redness at the wound, or calf pain, call your surgeon's office.   Constipation Prevention      Comments:   Drink plenty of fluids.  Prune juice may be helpful.  You may use a stool softener, such as Colace (over the counter) 100 mg twice a day.  Use MiraLax (over the counter) for constipation as needed.   Increase activity slowly as tolerated      Driving restrictions      Comments:   No driving for 6 weeks   Lifting restrictions      Comments:   No lifting for 6 weeks   CPM      Comments:   Continuous passive motion machine (CPM):      Use the CPM from 0 to 90 for 6-8 hours per day.      You may increase by 10 per day.  You may break it up into 2 or 3 sessions per day.      Use CPM for 3 weeks or until you are told to stop.   TED hose      Comments:   Use stockings (TED hose) for 3 weeks on both leg(s).  You may remove them at night for sleeping.   Change dressing      Comments:   Change dressing on friday, then change the dressing daily with  sterile 4 x 4 inch gauze dressing and apply TED hose.   Do not put a pillow under the knee. Place it under the heel.         DISCHARGE MEDICATIONS:     Medication List     As of 10/18/2011  1:23 PM    STOP taking these medications         aspirin 81 MG tablet      EXCEDRIN MIGRAINE PO      TAKE these medications         albuterol-ipratropium 18-103 MCG/ACT inhaler    Commonly known as: COMBIVENT   Inhale 2 puffs into the lungs every 6 (six) hours as needed. For shortness of breath      atorvastatin 40 MG tablet   Commonly known as: LIPITOR   Take 40 mg by mouth daily.      carbamazepine 200 MG 12 hr tablet   Commonly known as: TEGRETOL XR   Take 200-400 mg by mouth 2 (two) times daily. 200mg  in am and 400mg  at bedtime      DULoxetine 30 MG capsule   Commonly known as: CYMBALTA   Take 90 mg by mouth daily.      enoxaparin 40 MG/0.4ML injection   Commonly known as: LOVENOX   Inject 0.4 mLs (40 mg total) into the skin daily.      fenofibrate micronized 134 MG capsule   Commonly known as: LOFIBRA   Take 134 mg by mouth daily before breakfast.      fluticasone 50 MCG/ACT nasal spray   Commonly known as: FLONASE   Place 2 sprays into the nose daily.      HYDROcodone-acetaminophen 10-325 MG per tablet   Commonly known as: NORCO   Take 1-2 tablets by mouth every 4 (four) hours as needed.      LORazepam 1 MG tablet   Commonly known as: ATIVAN   Take 0.5-1 mg by mouth every 4 (four) hours as needed. For anxiety.      methocarbamol 500 MG tablet   Commonly known as: ROBAXIN   Take 1-2 tablets (500-1,000 mg total) by mouth every 6 (six) hours as needed.      metoprolol 200 MG 24 hr tablet   Commonly known as: TOPROL-XL   Take 200 mg by mouth every morning.      pregabalin 75 MG capsule   Commonly known as: LYRICA   Take 75 mg by mouth 2 (two) times daily.      promethazine 25 MG tablet   Commonly known as: PHENERGAN   Take 25 mg by mouth every 6 (six) hours as needed. For nausea.      verapamil 240 MG (CO) 24 hr tablet   Commonly known as: COVERA HS   Take 240 mg by mouth at bedtime.        FOLLOW UP VISIT:       Follow-up Information    Follow up with Raymon Mutton, MD. Call on 10/30/2011.   Contact information:   [address] [place] [address] [phone]          DISPOSITION: home  CONDITION:   {Good  Beya Tipps 10/18/2011, 1:23 PM

## 2011-10-18 NOTE — Progress Notes (Signed)
Physical Therapy Progress Note   10/18/11 1300  PT Visit Information  Last PT Received On 10/18/11  Assistance Needed +1  PT Time Calculation  PT Start Time 1334  PT Stop Time 1352  PT Time Calculation (min) 18 min  Subjective Data  Subjective Feeling sore and in pain.  Patient Stated Goal To go home  Precautions  Precautions Knee  Restrictions  Weight Bearing Restrictions Yes  LLE Weight Bearing WBAT  Cognition  Overall Cognitive Status Appears within functional limits for tasks assessed/performed  Arousal/Alertness Lethargic  Orientation Level Appears intact for tasks assessed  Behavior During Session Lethargic  Current Attention Level Selective  Attention - Other Comments Difficulty maintaining attention this pm during exercises  Exercises  Exercises Total Joint  Total Joint Exercises  Ankle Circles/Pumps AROM;10 reps;Both  Quad Sets AROM;10 reps;Left  Heel Slides AAROM;Left;10 reps  Short Arc [place];Left  Hip ABduction/ADduction AAROM;Left;5 reps  Straight Leg Raises AAROM;Left;5 reps  PT - End of Session  Activity Tolerance Patient tolerated treatment well  Patient left in bed;with call bell/phone within reach  Nurse Communication Mobility status  PT - Assessment/Plan  Comments on Treatment Session Pt was not as alert this afternoon but was motivated to learn her HEP.  Pt was educated about several exercises and given a sheet with directions.  Pt became confused several times and needed some cueing to get back on task. Pt also informed that another PT would stop in later to educate family on stair training.  PT Plan Frequency remains appropriate;Discharge plan remains appropriate  PT Frequency 7X/week  Recommendations for Other Services (None)  Follow Up Recommendations Home health PT  Equipment Recommended None recommended by PT  Acute Rehab PT Goals  PT Goal Formulation With patient  Time For Goal Achievement 10/23/11  Potential to Achieve Goals Good  Pt  will Perform Home Exercise Program Independently  PT Goal: Perform Home Exercise Program - Progress Progressing toward goal   Pt reported pain 6/10 left knee pain  Amy DiTommaso, SPT [place], PT DPT [phone]

## 2011-10-18 NOTE — Progress Notes (Signed)
  Breanna Spurling, MD   Breanna Cabal, PA-C [address] [place], [place], [place]  [address]                             [phone]   PROGRESS NOTE  Subjective:  negative for Chest Pain  negative for Shortness of Breath  negative for Nausea/Vomiting   negative for Calf Pain  negative for Bowel Movement   Tolerating Diet: yes         Patient reports pain as 7 on 0-10 scale.    Objective: Vital signs in last 24 hours:   Patient Vitals for the past 24 hrs:  BP Temp Pulse Resp SpO2  10/18/11 0624 127/59 mmHg 99.2 F (37.3 C) 89  18  98 %  10/18/11 0000 - - - 18  97 %  10/17/11 2200 135/43 mmHg 99.6 F (37.6 C) 95  18  97 %  10/17/11 2000 - - - 18  97 %  10/17/11 1600 - - - 16  96 %  10/17/11 1430 133/47 mmHg 99.9 F (37.7 C) 89  16  93 %    @flow {1959:LAST@   Intake/Output from previous day:   09/11 0701 - 09/12 0700 In: 1320 [P.O.:1320] Out: -    Intake/Output this shift:   09/12 0701 - 09/12 1900 In: 480 [P.O.:480] Out: -    Intake/Output      09/11 0701 - 09/12 0700 09/12 0701 - 09/13 0700   P.O. 1320 480   I.V.     Total Intake 1320 480   Drains     Total Output     Net +1320 +480        Urine Occurrence 11 x 1 x      LABORATORY DATA:  Basename 10/18/11 0645 10/17/11 0646 10/16/11 0535 10/11/11 1516  WBC 9.3 12.2* 15.0* 10.3  HGB 8.9* 9.5* 10.5* 13.7  HCT 26.8* 29.2* 32.6* 41.6  PLT 191 191 282 279    Basename 10/18/11 0645 10/17/11 0646 10/16/11 0535 10/11/11 1516  NA 140 138 138 140  K 3.4* 3.5 3.7 4.6  CL 103 103 102 104  CO2 27 26 24 26   BUN 8 7 11 15   CREATININE 0.53 0.55 0.54 0.54  GLUCOSE 128* 144* 133* 109*  CALCIUM 8.8 8.8 8.7 9.5   Lab Results  Component Value Date   INR 1.01 10/11/2011   INR 0.98 05/28/2011    Examination:  General appearance: alert, cooperative and no distress Extremities: Homans sign is negative, no sign of DVT  Wound Exam: clean, dry, intact   Drainage:  None: wound tissue dry  Motor Exam: EHL and FHL  Intact  Sensory Exam: Deep Peroneal normal  Vascular Exam:    Assessment:    3 Days Post-Op  Procedure(s) (LRB): TOTAL KNEE ARTHROPLASTY (Left)  ADDITIONAL DIAGNOSIS:  Active Problems:  * No active hospital problems. *   Acute Blood Loss Anemia   Plan: Physical Therapy as ordered Weight Bearing as Tolerated (WBAT)  DVT Prophylaxis:  Lovenox  DISCHARGE PLAN: Home  DISCHARGE NEEDS: HHPT, CPM, Walker and 3-in-1 comode seat         Breanna Dixon 10/18/2011, 1:09 PM

## 2011-10-18 NOTE — Progress Notes (Signed)
Physical Therapy Treatment Patient Details Name: Breanna Dixon MRN: [id number] DOB: Jun 05, 1945 Today's Date: 10/18/2011 Time: 7829-5621 PT Time Calculation (min): 39 min  PT Assessment / Plan / Recommendation Comments on Treatment Session  Pt was educated on proper technique for stair negoiation and request that her family be present to review steps with her this afternoon.  Pt did better with exercises and treatment and was less impulsive then yesterday.    Follow Up Recommendations  Home health PT    Barriers to Discharge  None      Equipment Recommendations  None recommended by PT    Recommendations for Other Services Other (comment) (None)  Frequency 7X/week   Plan Frequency remains appropriate;Discharge plan remains appropriate    Precautions / Restrictions Precautions Precautions: Knee Restrictions Weight Bearing Restrictions: Yes LLE Weight Bearing: Weight bearing as tolerated   Pertinent Vitals/Pain Pain in L LE    Mobility  Bed Mobility Bed Mobility: Supine to Sit;Scooting to HOB Supine to Sit: 5: Supervision;With rails;HOB flat Sitting - Scoot to Edge of Bed: 5: Supervision Details for Bed Mobility Assistance: Cues for proper technique and hand placement Transfers Transfers: Sit to Stand;Stand to Sit Sit to Stand: 4: Min guard;From bed Stand to Sit: 4: Min guard;With upper extremity assist Details for Transfer Assistance: Cues for proper UE/LE placement and safety Ambulation/Gait Ambulation/Gait Assistance: 4: Min guard Ambulation Distance (Feet): 60 Feet Assistive device: Rolling walker Ambulation/Gait Assistance Details: Cues for proper RW placement and foot placement.  Cues to increase extension in L LE and to promote a step through gait pattern Gait Pattern: Step-through pattern;Step-to pattern;Decreased stance time - left Gait velocity: slow Stairs: Yes Stairs Assistance: 4: Min assist Stairs Assistance Details (indicate cue type and reason): (A)  with holding walker in place during stairs. Cues for proper technique and RW placement.   Stair Management Technique: Backwards;No rails;With walker Number of Stairs: 2     Exercises Total Joint Exercises Quad Sets: AROM;5 reps;Left   PT Diagnosis:    PT Problem List:   PT Treatment Interventions:     PT Goals Acute Rehab PT Goals PT Goal Formulation: With patient Time For Goal Achievement: 10/23/11 Potential to Achieve Goals: Good Pt will go Supine/Side to Sit: with modified independence PT Goal: Supine/Side to Sit - Progress: Progressing toward goal Pt will go Sit to Stand: with modified independence PT Goal: Sit to Stand - Progress: Progressing toward goal Pt will go Stand to Sit: with modified independence PT Goal: Stand to Sit - Progress: Progressing toward goal Pt will Ambulate: 51 - 150 feet PT Goal: Ambulate - Progress: Partly met Pt will Go Up / Down Stairs: 1-2 stairs;with min assist;with rolling walker PT Goal: Up/Down Stairs - Progress: Partly met Pt will Perform Home Exercise Program: Independently PT Goal: Perform Home Exercise Program - Progress: Progressing toward goal  Visit Information  Last PT Received On: 10/18/11 Assistance Needed: +1 PT/OT Co-Evaluation/Treatment: Yes    Subjective Data  Subjective: Feeling better today and not in as much pain Patient Stated Goal: To go home   Cognition  Overall Cognitive Status: Appears within functional limits for tasks assessed/performed Arousal/Alertness: Awake/alert Orientation Level: Appears intact for tasks assessed Behavior During Session: [facility] for tasks performed    Balance     End of Session PT - End of Session Equipment Utilized During Treatment: Gait belt Activity Tolerance: Patient tolerated treatment well Patient left: in chair;with call bell/phone within reach Nurse Communication: Mobility status   GP  DITOMMASO, AMY 10/18/2011, 9:30 AM Jake Shark, PT DPT [phone]

## 2011-10-18 NOTE — Progress Notes (Signed)
Occupational Therapy Treatment Patient Details Name: Breanna Dixon MRN: [id number] DOB: 1945/09/10 Today's Date: 10/18/2011 Time: 8295-6213 OT Time Calculation (min): 40 min  OT Assessment / Plan / Recommendation Comments on Treatment Session Pt progressing with therapy. Continue to recommend HHOT    Follow Up Recommendations  Home health OT    Barriers to Discharge       Equipment Recommendations  None recommended by OT;None recommended by PT    Recommendations for Other Services    Frequency     Plan Discharge plan remains appropriate    Precautions / Restrictions Precautions Precautions: Knee Precaution Comments: L knee still unstable potential buckling Restrictions Weight Bearing Restrictions: Yes LLE Weight Bearing: Weight bearing as tolerated   Pertinent Vitals/Pain Pt reports 5/10 left knee pain, but states it improved with activity. Pt premedicated    ADL  Grooming: Simulated;Min guard Where Assessed - Grooming: Supported standing Lower Body Dressing: Performed;Min guard Where Assessed - Lower Body Dressing: Supported sit to Pharmacist, hospital: Performed;Min Pension scheme manager Method: Sit to Barista: Gaffer: Performed;Minimal assistance Tub/Shower Transfer Method: Ambulating (tub bench) Psychologist, educational: Counsellor Used: Gait belt;Rolling walker Transfers/Ambulation Related to ADLs: Min guard A with RW. VC for hand placement and sequencing ADL Comments: Pt has tub bench as well. however, pt was unaware she is able to switch grab bar on bench to the other side to allow for easier transfer into tub. Educated pt on how this is done. Also, pt reports she is unable to use RW in bathroom due to clutter - encouraged pt to have family remove excess items so as to be able to utilize RW.     OT Diagnosis:    OT Problem List:   OT Treatment Interventions:     OT Goals ADL  Goals ADL Goal: Toilet Transfer - Progress: Progressing toward goals ADL Goal: Toileting - Clothing Manipulation - Progress: Progressing toward goals ADL Goal: Tub/Shower Transfer - Progress: Progressing toward goals  Visit Information  Last OT Received On: 10/18/11 Assistance Needed: +1    Subjective Data      Prior Functioning       Cognition  Overall Cognitive Status: Appears within functional limits for tasks assessed/performed Arousal/Alertness: Awake/alert Orientation Level: Appears intact for tasks assessed Behavior During Session: [facility] for tasks performed Cognition - Other Comments: assist for problem solving at times    Mobility  Shoulder Instructions Bed Mobility Bed Mobility: Supine to Sit;Scooting to HOB Supine to Sit: 5: Supervision;HOB elevated Sitting - Scoot to Edge of Bed: 5: Supervision Details for Bed Mobility Assistance: pt able to hook under leg to bring to EOB Transfers Sit to Stand: 4: Min guard;From bed;With upper extremity assist Stand to Sit: To chair/3-in-1;With armrests;4: Min guard Details for Transfer Assistance: Cues for proper UE/LE placement and safety       Exercises  Total Joint Exercises Quad Sets: AROM;5 reps;Left   Balance     End of Session OT - End of Session Equipment Utilized During Treatment: Gait belt Activity Tolerance: Patient tolerated treatment well Patient left: in chair;with call bell/phone within reach Nurse Communication: Mobility status  GO     Boysie Bonebrake 10/18/2011, 9:53 AM

## 2011-10-20 ENCOUNTER — Encounter (HOSPITAL_COMMUNITY): Payer: Self-pay | Admitting: Emergency Medicine

## 2011-10-20 ENCOUNTER — Emergency Department (HOSPITAL_COMMUNITY)
Admission: EM | Admit: 2011-10-20 | Discharge: 2011-10-21 | Disposition: A | Discharge status: Home / Self Care | Payer: Medicare Other | Attending: Emergency Medicine | Admitting: Emergency Medicine

## 2011-10-20 DIAGNOSIS — I10 Essential (primary) hypertension: Secondary | ICD-10-CM | POA: Insufficient documentation

## 2011-10-20 DIAGNOSIS — Z79899 Other long term (current) drug therapy: Secondary | ICD-10-CM | POA: Insufficient documentation

## 2011-10-20 DIAGNOSIS — M7989 Other specified soft tissue disorders: Secondary | ICD-10-CM | POA: Insufficient documentation

## 2011-10-20 DIAGNOSIS — R0602 Shortness of breath: Secondary | ICD-10-CM | POA: Insufficient documentation

## 2011-10-20 DIAGNOSIS — N390 Urinary tract infection, site not specified: Secondary | ICD-10-CM | POA: Insufficient documentation

## 2011-10-20 DIAGNOSIS — R609 Edema, unspecified: Secondary | ICD-10-CM | POA: Insufficient documentation

## 2011-10-20 DIAGNOSIS — Z86711 Personal history of pulmonary embolism: Secondary | ICD-10-CM | POA: Insufficient documentation

## 2011-10-20 DIAGNOSIS — R062 Wheezing: Secondary | ICD-10-CM | POA: Insufficient documentation

## 2011-10-20 DIAGNOSIS — Z96659 Presence of unspecified artificial knee joint: Secondary | ICD-10-CM | POA: Insufficient documentation

## 2011-10-20 LAB — CBC WITH DIFFERENTIAL/PLATELET
Eosinophils Absolute: 0.2 10*3/uL (ref 0.0–0.7)
Eosinophils Relative: 2 % (ref 0–5)
HCT: 29 % — ABNORMAL LOW (ref 36.0–46.0)
Lymphocytes Relative: 18 % (ref 12–46)
Lymphs Abs: 1.6 10*3/uL (ref 0.7–4.0)
MCH: 28 pg (ref 26.0–34.0)
MCV: 86.3 fL (ref 78.0–100.0)
Monocytes Absolute: 0.6 10*3/uL (ref 0.1–1.0)
Platelets: 349 10*3/uL (ref 150–400)
RBC: 3.36 MIL/uL — ABNORMAL LOW (ref 3.87–5.11)
WBC: 8.9 10*3/uL (ref 4.0–10.5)

## 2011-10-20 LAB — BASIC METABOLIC PANEL
BUN: 8 mg/dL (ref 6–23)
CO2: 27 mEq/L (ref 19–32)
Calcium: 9.4 mg/dL (ref 8.4–10.5)
Creatinine, Ser: 0.47 mg/dL — ABNORMAL LOW (ref 0.50–1.10)
GFR calc non Af Amer: >90 mL/min (ref >90)
Glucose, Bld: 112 mg/dL — ABNORMAL HIGH (ref 70–99)
Sodium: 139 mEq/L (ref 135–145)

## 2011-10-20 MED ORDER — PROMETHAZINE HCL 25 MG PO TABS: 25.0000 mg | ORAL_TABLET | Freq: Four times a day (QID) | ORAL | Status: DC | PRN

## 2011-10-20 MED ORDER — LORAZEPAM 1 MG PO TABS: 0.5000 mg | ORAL_TABLET | Freq: Four times a day (QID) | ORAL | Status: DC | PRN

## 2011-10-20 MED ORDER — ONDANSETRON HCL 4 MG/2ML IJ SOLN
4.0000 mg | Freq: Once | INTRAMUSCULAR | Status: AC
  Administered 2011-10-20: 4 mg via INTRAVENOUS
  Filled 2011-10-20: qty 2

## 2011-10-20 MED ORDER — DULOXETINE HCL 60 MG PO CPEP
90.0000 mg | ORAL_CAPSULE | Freq: Every day | ORAL | Status: DC
  Filled 2011-10-20: qty 1

## 2011-10-20 MED ORDER — ENOXAPARIN SODIUM 100 MG/ML ~~LOC~~ SOLN
85.0000 mg | Freq: Two times a day (BID) | SUBCUTANEOUS | Status: DC
  Administered 2011-10-21: 85 mg via SUBCUTANEOUS
  Filled 2011-10-20 (×2): qty 1

## 2011-10-20 MED ORDER — CARBAMAZEPINE ER 200 MG PO TB12: 200.0000 mg | ORAL_TABLET | Freq: Two times a day (BID) | ORAL | Status: DC

## 2011-10-20 MED ORDER — PREGABALIN 75 MG PO CAPS
75.0000 mg | ORAL_CAPSULE | Freq: Two times a day (BID) | ORAL | Status: DC
  Administered 2011-10-21: 75 mg via ORAL
  Filled 2011-10-20: qty 1

## 2011-10-20 MED ORDER — METOPROLOL SUCCINATE ER 100 MG PO TB24
200.0000 mg | ORAL_TABLET | Freq: Every day | ORAL | Status: DC
  Filled 2011-10-20: qty 2

## 2011-10-20 MED ORDER — HYDROMORPHONE HCL PF 1 MG/ML IJ SOLN
1.0000 mg | Freq: Once | INTRAMUSCULAR | Status: AC
  Administered 2011-10-20: 1 mg via INTRAVENOUS
  Filled 2011-10-20: qty 1

## 2011-10-20 MED ORDER — FENOFIBRATE 54 MG PO TABS
54.0000 mg | ORAL_TABLET | Freq: Every day | ORAL | Status: DC
  Filled 2011-10-20: qty 1

## 2011-10-20 MED ORDER — VERAPAMIL HCL ER 240 MG PO TBCR
240.0000 mg | EXTENDED_RELEASE_TABLET | Freq: Every day | ORAL | Status: DC
  Administered 2011-10-21: 240 mg via ORAL
  Filled 2011-10-20: qty 1

## 2011-10-20 MED ORDER — IPRATROPIUM-ALBUTEROL 18-103 MCG/ACT IN AERO: 2.0000 | INHALATION_SPRAY | Freq: Four times a day (QID) | RESPIRATORY_TRACT | Status: DC | PRN

## 2011-10-20 MED ORDER — FLUTICASONE PROPIONATE 50 MCG/ACT NA SUSP: 2.0000 | Freq: Every day | NASAL | Status: DC

## 2011-10-20 NOTE — ED Notes (Signed)
Pedal pulses present bilaterally.

## 2011-10-20 NOTE — ED Notes (Signed)
Per PA, the patient is to have a cardiac diet and to be NPO after midnight.

## 2011-10-20 NOTE — ED Provider Notes (Signed)
10:59 PM Care assumed in the CDU for patient with joint swelling after knee surgery. Orthopedist has been notified and recommended Lovenox. She has been started on Lovenox and will have a venous doppler and VQ scan in the morning. Patient is currently stable.   11:58 PM Patient signed out to Dr. Hyacinth Meeker.   Emilia Beck, PA-C 10/20/11 2358

## 2011-10-20 NOTE — ED Provider Notes (Signed)
History     CSN: [id number]  Arrival date & time 10/20/11  4098   First MD Initiated Contact with Patient 10/20/11 1951      Chief Complaint  Patient presents with  . Joint Swelling    (Consider location/radiation/quality/duration/timing/severity/associated sxs/prior treatment) HPI Comments: Breanna Dixon 66 y.o. female   The chief complaint is: Patient presents with:   Joint Swelling   The patient has medical history significant for:   Past Medical History:   Complication of anesthesia                                     Comment:woke up during previous surgery   Cancer                                                         Comment:breast hx   Dysrhythmia                                                    Comment:tachycardia   Hypertension                                                   Comment:take verapamil and toprol daily   Bronchitis                                                   Seasonal allergies                                             Comment:flonase prn   Pulmonary embolism                              2006           Comment:hx of    Blood transfusion                               1974         Ulcer, gastric, acute                                          Comment:hx of   Fibromyalgia                                                 Mental disorder  Comment:hx of ptsd    Lung mass                                       April 2013     Comment:Right lower lobe lung mass   Pneumonia                                                      Comment:hx of.  5 x in life time.   Head injury, closed, with concussion                         Kidney stone                                                   Comment:hx of   Headache                                                       Comment:migranes, last one was in Jun 12, 2011   Arthritis                                                   Patient presents s/p left  total knee replacement on 10/15/11. She states that in the past two days she has notice significant swelling and redness around the incision site. She states that her thigh, calf, and feet are also swollen. Patient states that she has a history of spontaneous PE x 2 with IVC filter. Patient states that she is on Lovenox for prophylaxis and has 10 more doses. Reports subjective fever but denies chills. Denies NVD or abdominal pain. Reports some SOB or "lapse in breath, denies CP. Denies HRT. Denies recent long travel. Reports smoking history and history of breast cancer.       The history is provided by the patient.    Past Medical History  Diagnosis Date  . Complication of anesthesia     woke up during previous surgery  . Cancer     breast hx  . Dysrhythmia     tachycardia  . Hypertension     take verapamil and toprol daily  . Bronchitis   . Seasonal allergies     flonase prn  . Pulmonary embolism 2006    hx of   . Blood transfusion 1974  . Ulcer, gastric, acute     hx of  . Fibromyalgia   . Mental disorder     hx of ptsd   . Lung mass April 2013    Right lower lobe lung mass  . Pneumonia     hx of.  5 x in life time.  [name] Head injury, closed, with concussion   . Kidney stone     hx of  . Headache  migranes, last one was in Jun 12, 2011  . Arthritis     Past Surgical History  Procedure Date  . Cholecystectomy 1994  . Back surgery 2000/2010    fusion  . Tonsillectomy 1967  . Appendectomy 1965  . Cystoscopy     multiple  . Urethral dilation 1971  . Breast surgery     bilateral partial masectomy  . Abdominal hysterectomy 1992  . Dilation and curettage of uterus   . Hernia repair   . Colonoscopy   . Knee arthroscopy   . Total knee arthroplasty 06/04/2011    Procedure: TOTAL KNEE ARTHROPLASTY;  Surgeon: Raymon Mutton, MD;  Location: [facility] OR;  Service: Orthopedics;  Laterality: Right;  . Vena cava filter placement 2006  . Incisional hernia repair   . Multiple  tooth extractions   . Joint replacement   . Total knee arthroplasty 10/15/2011    Procedure: TOTAL KNEE ARTHROPLASTY;  Surgeon: Raymon Mutton, MD;  Location: [facility] OR;  Service: Orthopedics;  Laterality: Left;    Family History  Problem Relation Age of Onset  . Adopted: Yes  . Anesthesia problems Neg Hx   . Hypotension Neg Hx   . Malignant hyperthermia Neg Hx   . Pseudochol deficiency Neg Hx     History  Substance Use Topics  . Smoking status: Current Every Day Smoker -- 0.7 packs/day for 7 years    Types: Cigarettes  . Smokeless tobacco: Not on file  . Alcohol Use: No    OB History    Grav Para Term Preterm Abortions TAB SAB Ect Mult Living                  Review of Systems  Constitutional: Positive for fever. Negative for chills.  Respiratory: Positive for shortness of breath.   Cardiovascular: Positive for leg swelling. Negative for chest pain.  Gastrointestinal: Negative for nausea, vomiting, abdominal pain and diarrhea.  Musculoskeletal: Positive for joint swelling.  Skin: Positive for color change.  All other systems reviewed and are negative.    Allergies  Imitrex; Ketorolac tromethamine; Stadol; Ivp dye; and Shellfish allergy  Home Medications   Current Outpatient Rx  Name Route Sig Dispense Refill  . IPRATROPIUM-ALBUTEROL 18-103 MCG/ACT IN AERO Inhalation Inhale 2 puffs into the lungs every 6 (six) hours as needed. For shortness of breath    . CARBAMAZEPINE ER 200 MG PO TB12 Oral Take 200-400 mg by mouth 2 (two) times daily. 200mg  in am and 400mg  at bedtime    . DULOXETINE HCL 30 MG PO CPEP Oral Take 90 mg by mouth daily.    [name] ENOXAPARIN SODIUM 40 MG/0.4ML [place] SOLN Subcutaneous Inject 0.4 mLs (40 mg total) into the skin daily. 11 Syringe 0  . FENOFIBRATE MICRONIZED 134 MG PO CAPS Oral Take 134 mg by mouth daily before breakfast.    . FLUTICASONE PROPIONATE 50 MCG/ACT NA SUSP Nasal Place 2 sprays into the nose daily.    [name] LORAZEPAM 0.5 MG PO TABS Oral Take  0.5 mg by mouth every 6 (six) hours as needed. For anxiety    . METHOCARBAMOL 500 MG PO TABS Oral Take 1-2 tablets (500-1,000 mg total) by mouth every 6 (six) hours as needed. 60 tablet 0  . METOPROLOL SUCCINATE ER 200 MG PO TB24 Oral Take 200 mg by mouth every morning.    [name] PREGABALIN 75 MG PO CAPS Oral Take 75 mg by mouth 2 (two) times daily.    [name] PROMETHAZINE HCL 25  MG PO TABS Oral Take 25 mg by mouth every 6 (six) hours as needed. For nausea.    [name] VERAPAMIL HCL ER (CO) 240 MG PO TB24 Oral Take 240 mg by mouth at bedtime.      BP 119/49  Pulse 78  Temp 98.6 F (37 C) (Oral)  Resp 19  SpO2 100%  LMP 06/04/2011  Physical Exam  Nursing note and vitals reviewed. Constitutional: She appears well-developed and well-nourished.  HENT:  Head: Normocephalic and atraumatic.  Mouth/Throat: Oropharynx is clear and moist.  Eyes: Conjunctivae normal and EOM are normal. No scleral icterus.  Neck: Normal range of motion. Neck supple.  Cardiovascular: Normal rate, regular rhythm and normal heart sounds.        Distal pulses difficult to appreciate in left leg. Present in right.  Pulmonary/Chest: Effort normal. She has wheezes.  Abdominal: Soft. Bowel sounds are normal. There is no tenderness.  Musculoskeletal: She exhibits edema and tenderness.       Significant swelling from the level of the left hip down to the left foot. Staples intact at incision site. No fluctuance, induration or pus.   Neurological: She is alert.  Skin: Skin is warm and dry. There is erythema.       Mild erythema noted at the left knee.    ED Course  Procedures (including critical care time)  Labs Reviewed  CBC WITH DIFFERENTIAL - Abnormal; Notable for the following:    RBC 3.36 (*)     Hemoglobin 9.4 (*)     HCT 29.0 (*)     All other components within normal limits  BASIC METABOLIC PANEL - Abnormal; Notable for the following:    Glucose, Bld 112 (*)     Creatinine, Ser 0.47 (*)     All other components within  normal limits  URINALYSIS, ROUTINE W REFLEX MICROSCOPIC   Results for orders placed during the hospital encounter of 10/20/11  CBC WITH DIFFERENTIAL      Component Value Range   WBC 8.9  4.0 - 10.5 K/uL   RBC 3.36 (*) 3.87 - 5.11 MIL/uL   Hemoglobin 9.4 (*) 12.0 - 15.0 g/dL   HCT 14.7 (*) 82.9 - 56.2 %   MCV 86.3  78.0 - 100.0 fL   MCH 28.0  26.0 - 34.0 pg   MCHC 32.4  30.0 - 36.0 g/dL   RDW 13.0  86.5 - 78.4 %   Platelets 349  150 - 400 K/uL   Neutrophils Relative 74  43 - 77 %   Neutro Abs 6.6  1.7 - 7.7 K/uL   Lymphocytes Relative 18  12 - 46 %   Lymphs Abs 1.6  0.7 - 4.0 K/uL   Monocytes Relative 7  3 - 12 %   Monocytes Absolute 0.6  0.1 - 1.0 K/uL   Eosinophils Relative 2  0 - 5 %   Eosinophils Absolute 0.2  0.0 - 0.7 K/uL   Basophils Relative 0  0 - 1 %   Basophils Absolute 0.0  0.0 - 0.1 K/uL  BASIC METABOLIC PANEL      Component Value Range   Sodium 139  135 - 145 mEq/L   Potassium 3.5  3.5 - 5.1 mEq/L   Chloride 103  96 - 112 mEq/L   CO2 27  19 - 32 mEq/L   Glucose, Bld 112 (*) 70 - 99 mg/dL   BUN 8  6 - 23 mg/dL   Creatinine, Ser 6.96 (*) 0.50 - 1.10  mg/dL   Calcium 9.4  8.4 - 11.9 mg/dL   GFR calc non Af Amer >90  >90 mL/min   GFR calc Af Amer >90  >90 mL/min    No results found.   1. Leg swelling   2. History of pulmonary embolus (PE)   3. S/P knee replacement       MDM  Patient presented s/p total L knee replacement done on 10/15/11. CBC: remarkable for mild anemia. BMP: unremarkable. Venous doppler ordered but cannot be done until morning. Patient allergic to IVP dye so VQ/perfusion ordered and will also be done in the morning. Spoke with orthopedist on call Dr. Carola Frost who recommended upping her Lovenox dose to 85mg  BID. Patient will be transferred to CDU with clinical decision to follow after ancillary testing and results. Patient care assumed by K.S, PA-C.         Pixie Casino, PA-C 10/20/11 2335

## 2011-10-20 NOTE — ED Notes (Signed)
Pt had knee replacement on Monday by Dr. Valentina Gu.  Noticed increased swelling and pain x 2 days.  Also states left ankle and foot is more swollen than normal. Rates pain in knee at 6/10

## 2011-10-20 NOTE — ED Notes (Signed)
Pt daughter unwrapped knee for MD staff to view knee

## 2011-10-20 NOTE — ED Notes (Signed)
Pt used the bathroom already, will try again later. Pt is aware of the urine sample needed.

## 2011-10-21 ENCOUNTER — Emergency Department (HOSPITAL_COMMUNITY): Payer: Medicare Other

## 2011-10-21 ENCOUNTER — Other Ambulatory Visit (HOSPITAL_COMMUNITY): Payer: Medicare Other

## 2011-10-21 DIAGNOSIS — M7989 Other specified soft tissue disorders: Secondary | ICD-10-CM

## 2011-10-21 LAB — URINE MICROSCOPIC-ADD ON

## 2011-10-21 LAB — URINALYSIS, ROUTINE W REFLEX MICROSCOPIC
Glucose, UA: NEGATIVE mg/dL
Hgb urine dipstick: NEGATIVE
Ketones, ur: 15 mg/dL — AB
Protein, ur: NEGATIVE mg/dL
Urobilinogen, UA: 4 mg/dL — ABNORMAL HIGH (ref 0.0–1.0)

## 2011-10-21 MED ORDER — LORAZEPAM 2 MG/ML IJ SOLN
1.0000 mg | Freq: Once | INTRAMUSCULAR | Status: AC
  Administered 2011-10-21: 1 mg via INTRAVENOUS
  Filled 2011-10-21: qty 1

## 2011-10-21 MED ORDER — CEPHALEXIN 250 MG PO CAPS: 250.0000 mg | ORAL_CAPSULE | Freq: Four times a day (QID) | ORAL | Status: AC

## 2011-10-21 MED ORDER — HYDROMORPHONE HCL PF 1 MG/ML IJ SOLN
1.0000 mg | Freq: Once | INTRAMUSCULAR | Status: AC
  Administered 2011-10-21: 1 mg via INTRAVENOUS
  Filled 2011-10-21: qty 1

## 2011-10-21 MED ORDER — TECHNETIUM TO 99M ALBUMIN AGGREGATED
3.0000 | Freq: Once | INTRAVENOUS | Status: AC | PRN
  Administered 2011-10-21: 3 via INTRAVENOUS

## 2011-10-21 MED ORDER — CARBAMAZEPINE ER 200 MG PO TB12
200.0000 mg | ORAL_TABLET | Freq: Every day | ORAL | Status: DC
  Filled 2011-10-21: qty 1

## 2011-10-21 MED ORDER — CARBAMAZEPINE ER 400 MG PO TB12
400.0000 mg | ORAL_TABLET | Freq: Every day | ORAL | Status: DC
  Administered 2011-10-21: 400 mg via ORAL
  Filled 2011-10-21: qty 1

## 2011-10-21 MED ORDER — ONDANSETRON HCL 4 MG/2ML IJ SOLN
4.0000 mg | Freq: Once | INTRAMUSCULAR | Status: AC
  Administered 2011-10-21: 4 mg via INTRAVENOUS
  Filled 2011-10-21: qty 2

## 2011-10-21 NOTE — Progress Notes (Signed)
VASCULAR LAB PRELIMINARY  PRELIMINARY  PRELIMINARY  PRELIMINARY  Left lower extremity venous Dopper completed.    Preliminary report:  There is no DVT or SVT noted in the left lower extremity.  Breanna Dixon, 10/21/2011, 8:57 AM

## 2011-10-21 NOTE — ED Provider Notes (Signed)
Medical screening examination/treatment/procedure(s) were performed by non-physician practitioner and as supervising physician I was immediately available for consultation/collaboration.  Marwan T Powers, MD 10/21/11 0059 

## 2011-10-21 NOTE — Consult Note (Signed)
Breanna Dixon is an 66 y.o. female s/p TKA 10/15/11.  HPI: C/o pain and malaise, anxiety, but not specific chest pain or SOB; swelling has not worsened significantly; h/h 9/29; UA borderline   Past Medical History  Diagnosis Date  . Complication of anesthesia     woke up during previous surgery  . Cancer     breast hx  . Dysrhythmia     tachycardia  . Hypertension     take verapamil and toprol daily  . Bronchitis   . Seasonal allergies     flonase prn  . Pulmonary embolism 2006    hx of   . Blood transfusion 1974  . Ulcer, gastric, acute     hx of  . Fibromyalgia   . Mental disorder     hx of ptsd   . Lung mass April 2013    Right lower lobe lung mass  . Pneumonia     hx of.  5 x in life time.  [name] Head injury, closed, with concussion   . Kidney stone     hx of  . Headache     migranes, last one was in Jun 12, 2011  . Arthritis     Past Surgical History  Procedure Date  . Cholecystectomy 1994  . Back surgery 2000/2010    fusion  . Tonsillectomy 1967  . Appendectomy 1965  . Cystoscopy     multiple  . Urethral dilation 1971  . Breast surgery     bilateral partial masectomy  . Abdominal hysterectomy 1992  . Dilation and curettage of uterus   . Hernia repair   . Colonoscopy   . Knee arthroscopy   . Total knee arthroplasty 06/04/2011    Procedure: TOTAL KNEE ARTHROPLASTY;  Surgeon: Raymon Mutton, MD;  Location: [facility] OR;  Service: Orthopedics;  Laterality: Right;  . Vena cava filter placement 2006  . Incisional hernia repair   . Multiple tooth extractions   . Joint replacement   . Total knee arthroplasty 10/15/2011    Procedure: TOTAL KNEE ARTHROPLASTY;  Surgeon: Raymon Mutton, MD;  Location: [facility] OR;  Service: Orthopedics;  Laterality: Left;    Family History  Problem Relation Age of Onset  . Adopted: Yes  . Anesthesia problems Neg Hx   . Hypotension Neg Hx   . Malignant hyperthermia Neg Hx   . Pseudochol deficiency Neg Hx     Social History:  reports  that she has been smoking Cigarettes.  She has a 5.25 pack-year smoking history. She does not have any smokeless tobacco history on file. She reports that she does not drink alcohol or use illicit drugs.  Allergies:  Allergies  Allergen Reactions  . Imitrex (Sumatriptan Base) Nausea And Vomiting    Rapid heart beat.  [name] Ketorolac Tromethamine Nausea And Vomiting    Rapid heart beat.  . Stadol (Butorphanol Tartrate) Nausea And Vomiting  . Ivp Dye (Iodinated Diagnostic Agents) Nausea Only and Palpitations    Can tolerate iodine  . Shellfish Allergy Nausea Only and Palpitations    Medications: I have reviewed the patient's current medications.  Results for orders placed during the hospital encounter of 10/20/11 (from the past 48 hour(s))  CBC WITH DIFFERENTIAL     Status: Abnormal   Collection Time   10/20/11  8:14 PM      Component Value Range Comment   WBC 8.9  4.0 - 10.5 K/uL    RBC 3.36 (*) 3.87 - 5.11 MIL/uL  Hemoglobin 9.4 (*) 12.0 - 15.0 g/dL    HCT 54.0 (*) 98.1 - 46.0 %    MCV 86.3  78.0 - 100.0 fL    MCH 28.0  26.0 - 34.0 pg    MCHC 32.4  30.0 - 36.0 g/dL    RDW 19.1  47.8 - 29.5 %    Platelets 349  150 - 400 K/uL    Neutrophils Relative 74  43 - 77 %    Neutro Abs 6.6  1.7 - 7.7 K/uL    Lymphocytes Relative 18  12 - 46 %    Lymphs Abs 1.6  0.7 - 4.0 K/uL    Monocytes Relative 7  3 - 12 %    Monocytes Absolute 0.6  0.1 - 1.0 K/uL    Eosinophils Relative 2  0 - 5 %    Eosinophils Absolute 0.2  0.0 - 0.7 K/uL    Basophils Relative 0  0 - 1 %    Basophils Absolute 0.0  0.0 - 0.1 K/uL   BASIC METABOLIC PANEL     Status: Abnormal   Collection Time   10/20/11  8:14 PM      Component Value Range Comment   Sodium 139  135 - 145 mEq/L    Potassium 3.5  3.5 - 5.1 mEq/L    Chloride 103  96 - 112 mEq/L    CO2 27  19 - 32 mEq/L    Glucose, Bld 112 (*) 70 - 99 mg/dL    BUN 8  6 - 23 mg/dL    Creatinine, Ser 6.21 (*) 0.50 - 1.10 mg/dL    Calcium 9.4  8.4 - 30.8 mg/dL     GFR calc non Af Amer >90  >90 mL/min    GFR calc Af Amer >90  >90 mL/min   URINALYSIS, ROUTINE W REFLEX MICROSCOPIC     Status: Abnormal   Collection Time   10/21/11  2:26 AM      Component Value Range Comment   Color, Urine ORANGE (*) YELLOW BIOCHEMICALS MAY BE AFFECTED BY COLOR   APPearance CLEAR  CLEAR    Specific Gravity, Urine 1.023  1.005 - 1.030    pH 6.5  5.0 - 8.0    Glucose, UA NEGATIVE  NEGATIVE mg/dL    Hgb urine dipstick NEGATIVE  NEGATIVE    Bilirubin Urine SMALL (*) NEGATIVE    Ketones, ur 15 (*) NEGATIVE mg/dL    Protein, ur NEGATIVE  NEGATIVE mg/dL    Urobilinogen, UA 4.0 (*) 0.0 - 1.0 mg/dL    Nitrite NEGATIVE  NEGATIVE    Leukocytes, UA TRACE (*) NEGATIVE   URINE MICROSCOPIC-ADD ON     Status: Normal   Collection Time   10/21/11  2:26 AM      Component Value Range Comment   Squamous Epithelial / LPF RARE  RARE    WBC, UA 0-2  <3 WBC/hpf    RBC / HPF 0-2  <3 RBC/hpf    Bacteria, UA RARE  RARE    Urine-Other MUCOUS PRESENT       Nm Pulmonary Perfusion  10/21/2011  *RADIOLOGY REPORT*  Clinical Data: 66 year old female with shortness of breath.  Recent knee replacement.  NM PULMONARY PERFUSION PARTICULATE  Radiopharmaceutical:  3 mCi technetium 50m MAA  Comparison: 10/11/2011  Findings: Perfusion images demonstrate no evidence of perfusion defects within the lungs bilaterally. No significant abnormalities are present.  IMPRESSION: Normal perfusion of both lungs - no evidence of pulmonary emboli.  Original Report Authenticated By: Rosendo Gros, M.D.      Blood pressure 136/54, pulse 81, temperature 98.8 F (37.1 C), temperature source Oral, resp. rate 18, last menstrual period 06/04/2011, SpO2 95.00%.  PE   Alert &O, conversant  No wheezing, no SOB, no retractions  Abd soft, NT  LLE incision pristine, appropriate swelling without significant edema, knee ROM 5--70, no erythema, slight calf tenderness, DP 2+  Assessment/Plan: Strong PMH of PE x 2; but with IVC  filter  Because of poorer sensitivity and specificity with VQ as opposed to CTA, we started a treatment dose of Lovenox at 85mg  last night.  Currently, presenting history and negative VQ do not indicate strong likelihood of PE.  Consequently,  if the Doppler study currently underway is also read as negative, we will revert from provisional treatment dose BID back to 40mg  daily as a standard prevention dose. I have explained these options to the patient, who agrees with the plan.  She will continue with follow up as scheduled with Dr. Sherlean Foot. She will notify us immediately with chest pain, shortness of breath, or other clinical changes.   Myrene Galas, MD [facility] [phone] (p) 10/21/2011  8:41 AM

## 2011-10-21 NOTE — ED Notes (Signed)
Advised the patient we need a urine sample. 

## 2011-10-21 NOTE — ED Notes (Signed)
Note sent to pharmacy requesting missing meds. 

## 2011-10-21 NOTE — ED Provider Notes (Signed)
7:30 AM BP 136/54  Pulse 81  Temp 98.8 F (37.1 C) (Oral)  Resp 18  SpO2 95%  LMP 06/04/2011 Assumed care of patient in CDU. She is here awaiting ultrasound of her left extremity. She is status post left total knee arthroplasty on 10/15/2011. Patient denies fevers, chills, myalgias. I allowed her to take her home medications. This morning. We will control anxiety and pain CV: RRR, No M/R/G, Peripheral pulses intact. No peripheral edema. Distant history of pulmonary embolism. VQ scan shows no evidence. Lungs: CTAB Abd: Soft, Non tender, non distended Msk: Left leg shows well healing incision over the knee. Staples are intact. There is no evidence of infection. There is some bruising on the medial aspect of the left thigh. Left lower extremity with 1+ pitting edema over the malleoli. Distal pulses are intact. Sensation is intact. There is swelling present up to the knee.    9:41 AM Ultrasound is negative for DVT. Urinalysis shows urinary tract infection. I have ordered urine culture. Patient is safe for discharge at that time. She may follow up with Dr. Dannielle Huh. Discussed reasons to seek immediate care. Patient expresses understanding and agrees with plan.   Arthor Captain, PA-C 10/31/11 2155

## 2011-10-21 NOTE — ED Notes (Signed)
Patient transported to Nuclear Medicine 

## 2011-10-21 NOTE — ED Notes (Signed)
Spoke with pharmacy.  Meds are being worked on and should arrive shortly.

## 2011-10-21 NOTE — ED Provider Notes (Signed)
Medical screening examination/treatment/procedure(s) were conducted as a shared visit with non-physician practitioner(s) and myself.  I personally evaluated the patient during the encounter.  I personally examined Breanna Dixon.  Note no evidence of cellulitis or wound infection.  She does have significant swelling of her entire left leg.  I discussed this with the on-call provider for her orthopedic specialist.  He recommended increasing her dose of lovenox.  She will remain in obs until the morning at which time a V/Q scan will be performed.  Pt signed over to Dr. Hyacinth Meeker at 0100.  [name], MD 10/21/11 [phone]

## 2011-10-23 LAB — URINE CULTURE: Colony Count: NO GROWTH

## 2011-11-03 NOTE — ED Provider Notes (Signed)
Medical screening examination/treatment/procedure(s) were performed by non-physician practitioner and as supervising physician I was immediately available for consultation/collaboration.   Carleene Cooper III, MD 11/03/11 [phone]

## 2011-11-07 ENCOUNTER — Ambulatory Visit: Payer: Medicare Other | Admitting: Physical Therapy

## 2011-11-08 ENCOUNTER — Ambulatory Visit: Payer: Medicare Other | Attending: Orthopedic Surgery | Admitting: Physical Therapy

## 2011-11-08 DIAGNOSIS — R262 Difficulty in walking, not elsewhere classified: Secondary | ICD-10-CM | POA: Insufficient documentation

## 2011-11-08 DIAGNOSIS — IMO0001 Reserved for inherently not codable concepts without codable children: Secondary | ICD-10-CM | POA: Insufficient documentation

## 2011-11-08 DIAGNOSIS — M25669 Stiffness of unspecified knee, not elsewhere classified: Secondary | ICD-10-CM | POA: Insufficient documentation

## 2011-11-08 DIAGNOSIS — Z96659 Presence of unspecified artificial knee joint: Secondary | ICD-10-CM | POA: Insufficient documentation

## 2011-11-08 DIAGNOSIS — M25569 Pain in unspecified knee: Secondary | ICD-10-CM | POA: Insufficient documentation

## 2011-11-12 ENCOUNTER — Ambulatory Visit: Payer: Medicare Other | Admitting: Physical Therapy

## 2011-11-14 ENCOUNTER — Ambulatory Visit: Payer: Medicare Other | Admitting: Physical Therapy

## 2011-11-16 ENCOUNTER — Ambulatory Visit: Payer: Medicare Other | Admitting: Physical Therapy

## 2011-11-19 ENCOUNTER — Ambulatory Visit: Payer: Medicare Other | Admitting: Physical Therapy

## 2011-11-21 ENCOUNTER — Ambulatory Visit: Payer: Medicare Other | Admitting: Physical Therapy

## 2011-11-23 ENCOUNTER — Ambulatory Visit: Payer: Medicare Other | Admitting: Physical Therapy

## 2011-11-26 ENCOUNTER — Ambulatory Visit: Payer: Medicare Other | Admitting: Physical Therapy

## 2011-11-28 ENCOUNTER — Ambulatory Visit: Payer: Medicare Other | Admitting: Physical Therapy

## 2011-11-30 ENCOUNTER — Ambulatory Visit: Payer: Medicare Other | Admitting: Physical Therapy

## 2011-12-03 ENCOUNTER — Ambulatory Visit: Payer: Medicare Other | Admitting: Physical Therapy

## 2011-12-05 ENCOUNTER — Ambulatory Visit: Payer: Medicare Other | Admitting: Physical Therapy

## 2011-12-07 ENCOUNTER — Ambulatory Visit: Payer: Medicare Other | Admitting: Physical Therapy

## 2011-12-10 ENCOUNTER — Ambulatory Visit: Payer: Medicare Other | Attending: Orthopedic Surgery | Admitting: Physical Therapy

## 2011-12-10 DIAGNOSIS — M25569 Pain in unspecified knee: Secondary | ICD-10-CM | POA: Insufficient documentation

## 2011-12-10 DIAGNOSIS — R262 Difficulty in walking, not elsewhere classified: Secondary | ICD-10-CM | POA: Insufficient documentation

## 2011-12-10 DIAGNOSIS — Z96659 Presence of unspecified artificial knee joint: Secondary | ICD-10-CM | POA: Insufficient documentation

## 2011-12-10 DIAGNOSIS — M25669 Stiffness of unspecified knee, not elsewhere classified: Secondary | ICD-10-CM | POA: Insufficient documentation

## 2011-12-10 DIAGNOSIS — IMO0001 Reserved for inherently not codable concepts without codable children: Secondary | ICD-10-CM | POA: Insufficient documentation

## 2011-12-12 ENCOUNTER — Ambulatory Visit: Payer: Medicare Other | Admitting: Physical Therapy

## 2011-12-12 ENCOUNTER — Other Ambulatory Visit: Payer: Self-pay | Admitting: *Deleted

## 2011-12-12 DIAGNOSIS — R911 Solitary pulmonary nodule: Secondary | ICD-10-CM

## 2011-12-14 ENCOUNTER — Ambulatory Visit: Payer: Medicare Other | Admitting: Physical Therapy

## 2011-12-19 ENCOUNTER — Ambulatory Visit: Payer: Medicare Other | Admitting: Physical Therapy

## 2011-12-21 ENCOUNTER — Ambulatory Visit
Admission: RE | Admit: 2011-12-21 | Discharge: 2011-12-21 | Disposition: A | Discharge status: Home / Self Care | Payer: Medicare Other | Source: Ambulatory Visit | Attending: *Deleted | Admitting: *Deleted

## 2011-12-21 DIAGNOSIS — R911 Solitary pulmonary nodule: Secondary | ICD-10-CM

## 2011-12-24 ENCOUNTER — Ambulatory Visit: Payer: Medicare Other | Admitting: Physical Therapy

## 2011-12-28 ENCOUNTER — Ambulatory Visit: Payer: Medicare Other | Admitting: Physical Therapy

## 2012-01-01 ENCOUNTER — Ambulatory Visit: Payer: Medicare Other | Admitting: Physical Therapy

## 2012-01-04 ENCOUNTER — Encounter (HOSPITAL_COMMUNITY): Payer: Self-pay | Admitting: Emergency Medicine

## 2012-01-04 ENCOUNTER — Inpatient Hospital Stay (HOSPITAL_COMMUNITY)
Admission: EM | Admit: 2012-01-04 | Discharge: 2012-01-12 | DRG: 189 | Disposition: A | Discharge status: Home / Self Care | Payer: Medicare Other | Attending: Family Medicine | Admitting: Family Medicine

## 2012-01-04 ENCOUNTER — Emergency Department (HOSPITAL_COMMUNITY): Payer: Medicare Other

## 2012-01-04 DIAGNOSIS — R0902 Hypoxemia: Secondary | ICD-10-CM

## 2012-01-04 DIAGNOSIS — J9601 Acute respiratory failure with hypoxia: Secondary | ICD-10-CM | POA: Diagnosis present

## 2012-01-04 DIAGNOSIS — Z720 Tobacco use: Secondary | ICD-10-CM

## 2012-01-04 DIAGNOSIS — E876 Hypokalemia: Secondary | ICD-10-CM | POA: Diagnosis present

## 2012-01-04 DIAGNOSIS — J441 Chronic obstructive pulmonary disease with (acute) exacerbation: Secondary | ICD-10-CM | POA: Diagnosis present

## 2012-01-04 DIAGNOSIS — J9600 Acute respiratory failure, unspecified whether with hypoxia or hypercapnia: Principal | ICD-10-CM | POA: Diagnosis present

## 2012-01-04 DIAGNOSIS — R0603 Acute respiratory distress: Secondary | ICD-10-CM

## 2012-01-04 DIAGNOSIS — I519 Heart disease, unspecified: Secondary | ICD-10-CM | POA: Diagnosis present

## 2012-01-04 DIAGNOSIS — J40 Bronchitis, not specified as acute or chronic: Secondary | ICD-10-CM

## 2012-01-04 DIAGNOSIS — R0602 Shortness of breath: Secondary | ICD-10-CM

## 2012-01-04 DIAGNOSIS — R918 Other nonspecific abnormal finding of lung field: Secondary | ICD-10-CM

## 2012-01-04 DIAGNOSIS — M1990 Unspecified osteoarthritis, unspecified site: Secondary | ICD-10-CM

## 2012-01-04 DIAGNOSIS — J449 Chronic obstructive pulmonary disease, unspecified: Secondary | ICD-10-CM

## 2012-01-04 DIAGNOSIS — M129 Arthropathy, unspecified: Secondary | ICD-10-CM

## 2012-01-04 DIAGNOSIS — F17200 Nicotine dependence, unspecified, uncomplicated: Secondary | ICD-10-CM | POA: Diagnosis present

## 2012-01-04 DIAGNOSIS — I10 Essential (primary) hypertension: Secondary | ICD-10-CM | POA: Insufficient documentation

## 2012-01-04 DIAGNOSIS — R911 Solitary pulmonary nodule: Secondary | ICD-10-CM | POA: Diagnosis present

## 2012-01-04 DIAGNOSIS — J45909 Unspecified asthma, uncomplicated: Secondary | ICD-10-CM

## 2012-01-04 DIAGNOSIS — R222 Localized swelling, mass and lump, trunk: Secondary | ICD-10-CM

## 2012-01-04 LAB — CBC WITH DIFFERENTIAL/PLATELET
Basophils Absolute: 0 K/uL (ref 0.0–0.1)
Basophils Relative: 0 % (ref 0–1)
Eosinophils Absolute: 0 K/uL (ref 0.0–0.7)
Eosinophils Relative: 0 % (ref 0–5)
HCT: 39.4 % (ref 36.0–46.0)
Hemoglobin: 12.8 g/dL (ref 12.0–15.0)
Lymphocytes Relative: 6 % — ABNORMAL LOW (ref 12–46)
Lymphs Abs: 0.5 10*3/uL — ABNORMAL LOW (ref 0.7–4.0)
MCH: 27.5 pg (ref 26.0–34.0)
MCHC: 32.5 g/dL (ref 30.0–36.0)
MCV: 84.5 fL (ref 78.0–100.0)
Monocytes Absolute: 0.2 K/uL (ref 0.1–1.0)
Monocytes Relative: 2 % — ABNORMAL LOW (ref 3–12)
Neutro Abs: 8 K/uL — ABNORMAL HIGH (ref 1.7–7.7)
Neutrophils Relative %: 92 % — ABNORMAL HIGH (ref 43–77)
Platelets: 196 10*3/uL (ref 150–400)
RBC: 4.66 MIL/uL (ref 3.87–5.11)
RDW: 13.8 % (ref 11.5–15.5)
WBC: 8.7 10*3/uL (ref 4.0–10.5)

## 2012-01-04 LAB — BASIC METABOLIC PANEL
CO2: 18 mEq/L — ABNORMAL LOW (ref 19–32)
Chloride: 101 mEq/L (ref 96–112)
Potassium: 4 mEq/L (ref 3.5–5.1)
Sodium: 136 mEq/L (ref 135–145)

## 2012-01-04 LAB — URINALYSIS, ROUTINE W REFLEX MICROSCOPIC
Bilirubin Urine: NEGATIVE
Glucose, UA: NEGATIVE mg/dL
Hgb urine dipstick: NEGATIVE
Ketones, ur: NEGATIVE mg/dL
Leukocytes, UA: NEGATIVE
Nitrite: NEGATIVE
Protein, ur: NEGATIVE mg/dL
Specific Gravity, Urine: 1.018 (ref 1.005–1.030)
Urobilinogen, UA: 1 mg/dL (ref 0.0–1.0)
pH: 6.5 (ref 5.0–8.0)

## 2012-01-04 LAB — BASIC METABOLIC PANEL WITH GFR
BUN: 10 mg/dL (ref 6–23)
Calcium: 9.2 mg/dL (ref 8.4–10.5)
Creatinine, Ser: 0.42 mg/dL — ABNORMAL LOW (ref 0.50–1.10)
GFR calc Af Amer: >90 mL/min (ref >90)
GFR calc non Af Amer: >90 mL/min (ref >90)
Glucose, Bld: 187 mg/dL — ABNORMAL HIGH (ref 70–99)

## 2012-01-04 MED ORDER — IPRATROPIUM BROMIDE 0.02 % IN SOLN
0.5000 mg | Freq: Once | RESPIRATORY_TRACT | Status: AC
  Administered 2012-01-04: 0.5 mg via RESPIRATORY_TRACT
  Filled 2012-01-04: qty 5

## 2012-01-04 MED ORDER — ENOXAPARIN SODIUM 40 MG/0.4ML ~~LOC~~ SOLN
40.0000 mg | SUBCUTANEOUS | Status: DC
  Administered 2012-01-04 – 2012-01-11 (×8): 40 mg via SUBCUTANEOUS
  Filled 2012-01-04 (×9): qty 0.4

## 2012-01-04 MED ORDER — ALBUTEROL SULFATE (5 MG/ML) 0.5% IN NEBU
5.0000 mg | INHALATION_SOLUTION | Freq: Once | RESPIRATORY_TRACT | Status: AC
  Administered 2012-01-04: 5 mg via RESPIRATORY_TRACT
  Filled 2012-01-04: qty 0.5

## 2012-01-04 MED ORDER — ACETAMINOPHEN 325 MG PO TABS: 650.0000 mg | ORAL_TABLET | Freq: Four times a day (QID) | ORAL | Status: DC | PRN

## 2012-01-04 MED ORDER — ACETAMINOPHEN 650 MG RE SUPP: 650.0000 mg | Freq: Four times a day (QID) | RECTAL | Status: DC | PRN

## 2012-01-04 MED ORDER — ZOLPIDEM TARTRATE 5 MG PO TABS
5.0000 mg | ORAL_TABLET | Freq: Every evening | ORAL | Status: DC | PRN
  Administered 2012-01-04 – 2012-01-06 (×2): 5 mg via ORAL
  Filled 2012-01-04 (×2): qty 1

## 2012-01-04 MED ORDER — ALUM & MAG HYDROXIDE-SIMETH 200-200-20 MG/5ML PO SUSP: 30.0000 mL | Freq: Four times a day (QID) | ORAL | Status: DC | PRN

## 2012-01-04 MED ORDER — OXYCODONE HCL 5 MG PO TABS
5.0000 mg | ORAL_TABLET | ORAL | Status: DC | PRN
  Administered 2012-01-04 – 2012-01-05 (×2): 5 mg via ORAL
  Filled 2012-01-04 (×2): qty 1

## 2012-01-04 MED ORDER — ALBUTEROL SULFATE (5 MG/ML) 0.5% IN NEBU
2.5000 mg | INHALATION_SOLUTION | RESPIRATORY_TRACT | Status: DC
  Administered 2012-01-04 – 2012-01-05 (×4): 2.5 mg via RESPIRATORY_TRACT
  Filled 2012-01-04 (×4): qty 0.5

## 2012-01-04 MED ORDER — IPRATROPIUM BROMIDE 0.02 % IN SOLN
0.5000 mg | RESPIRATORY_TRACT | Status: DC
  Administered 2012-01-04 – 2012-01-05 (×4): 0.5 mg via RESPIRATORY_TRACT
  Filled 2012-01-04 (×4): qty 2.5

## 2012-01-04 MED ORDER — DEXTROSE 5 % IV SOLN
500.0000 mg | INTRAVENOUS | Status: DC
  Administered 2012-01-05: 500 mg via INTRAVENOUS
  Filled 2012-01-04: qty 500

## 2012-01-04 MED ORDER — PREDNISONE 20 MG PO TABS: 60.0000 mg | ORAL_TABLET | Freq: Once | ORAL | Status: DC

## 2012-01-04 MED ORDER — AZITHROMYCIN 250 MG PO TABS
500.0000 mg | ORAL_TABLET | Freq: Once | ORAL | Status: AC
  Administered 2012-01-04: 500 mg via ORAL
  Filled 2012-01-04: qty 2

## 2012-01-04 MED ORDER — HYDROMORPHONE HCL PF 1 MG/ML IJ SOLN: 0.5000 mg | INTRAMUSCULAR | Status: DC | PRN

## 2012-01-04 MED ORDER — METHYLPREDNISOLONE SODIUM SUCC 125 MG IJ SOLR
80.0000 mg | Freq: Three times a day (TID) | INTRAMUSCULAR | Status: DC
  Administered 2012-01-06: 06:00:00 via INTRAVENOUS
  Filled 2012-01-04 (×4): qty 1.28

## 2012-01-04 MED ORDER — ALBUTEROL SULFATE (5 MG/ML) 0.5% IN NEBU
5.0000 mg | INHALATION_SOLUTION | RESPIRATORY_TRACT | Status: DC
  Administered 2012-01-04: 5 mg via RESPIRATORY_TRACT
  Filled 2012-01-04: qty 1

## 2012-01-04 MED ORDER — ONDANSETRON HCL 4 MG/2ML IJ SOLN: 4.0000 mg | Freq: Four times a day (QID) | INTRAMUSCULAR | Status: DC | PRN

## 2012-01-04 MED ORDER — HYDROCODONE-ACETAMINOPHEN 5-325 MG PO TABS
2.0000 | ORAL_TABLET | Freq: Once | ORAL | Status: AC
  Administered 2012-01-04: 2 via ORAL
  Filled 2012-01-04: qty 2

## 2012-01-04 MED ORDER — ONDANSETRON HCL 4 MG PO TABS
4.0000 mg | ORAL_TABLET | Freq: Four times a day (QID) | ORAL | Status: DC | PRN
  Administered 2012-01-05: 4 mg via ORAL
  Filled 2012-01-04: qty 1

## 2012-01-04 MED ORDER — SODIUM CHLORIDE 0.9 % IV SOLN
INTRAVENOUS | Status: DC
  Administered 2012-01-04: 22:00:00 via INTRAVENOUS

## 2012-01-04 MED ORDER — METHYLPREDNISOLONE SODIUM SUCC 125 MG IJ SOLR
125.0000 mg | Freq: Four times a day (QID) | INTRAMUSCULAR | Status: AC
  Administered 2012-01-04 – 2012-01-05 (×4): 125 mg via INTRAVENOUS
  Filled 2012-01-04 (×3): qty 2

## 2012-01-04 NOTE — ED Notes (Addendum)
Hospitalist in ED, about to see pt, RT called for breathing tx.

## 2012-01-04 NOTE — ED Notes (Signed)
Report called to 5E but pt with no holding orders, has not been seen by admitting MD, will inform Dr. Oletta Lamas and transfer when orders present.

## 2012-01-04 NOTE — H&P (Signed)
[facility] History and Physical  Breanna Dixon ZOX:[id number] DOB: [birth year] DOA: 01/04/2012  Referring physician:  PCP: Mattie Marlin, MD  Specialists:   Chief Complaint: SOB, Fever  HPI: Breanna Dixon is a 66 y.o. female who presents to the ED with complaints of worsening SOB and wheezing  And cough for the past 4 days.  She began to have fevers today, the cough has been nonproductive.  Her grandchildren who are visiting are sick with colds.  She is a heavy smoker and has weaned herself down to no cigarettes this past week and is on a nicotine patch now.  She previously smoked 1-2 packs daily.    Review of Systems: The patient denies anorexia, fever, weight loss,, vision loss, decreased hearing, hoarseness, chest pain, syncope, dyspnea on exertion, peripheral edema, balance deficits, hemoptysis, abdominal pain, melena, hematochezia, severe indigestion/heartburn, hematuria, incontinence, genital sores, muscle weakness, suspicious skin lesions, transient blindness, difficulty walking, depression, unusual weight change, abnormal bleeding, enlarged lymph nodes, angioedema, and breast masses.    Past Medical History  Diagnosis Date  . Complication of anesthesia     woke up during previous surgery  . Cancer     breast hx  . Dysrhythmia     tachycardia  . Hypertension     take verapamil and toprol daily  . Bronchitis   . Seasonal allergies     flonase prn  . Pulmonary embolism 2006    hx of   . Blood transfusion 1974  . Ulcer, gastric, acute     hx of  . Fibromyalgia   . Mental disorder     hx of ptsd   . Lung mass April 2013    Right lower lobe lung mass  . Pneumonia     hx of.  5 x in life time.  [name] Head injury, closed, with concussion   . Kidney stone     hx of  . Headache     migranes, last one was in Jun 12, 2011  . Arthritis   . Hypertension    Past Surgical History  Procedure Date  . Cholecystectomy 1994  . Back surgery 2000/2010    fusion  .  Tonsillectomy 1967  . Appendectomy 1965  . Cystoscopy     multiple  . Urethral dilation 1971  . Breast surgery     bilateral partial masectomy  . Abdominal hysterectomy 1992  . Dilation and curettage of uterus   . Hernia repair   . Colonoscopy   . Knee arthroscopy   . Total knee arthroplasty 06/04/2011    Procedure: TOTAL KNEE ARTHROPLASTY;  Surgeon: Raymon Mutton, MD;  Location: [facility] OR;  Service: Orthopedics;  Laterality: Right;  . Vena cava filter placement 2006  . Incisional hernia repair   . Multiple tooth extractions   . Joint replacement   . Total knee arthroplasty 10/15/2011    Procedure: TOTAL KNEE ARTHROPLASTY;  Surgeon: Raymon Mutton, MD;  Location: [facility] OR;  Service: Orthopedics;  Laterality: Left;  . Ivc filter      Medications:  HOME MEDS: Prior to Admission medications   Medication Sig Start Date End Date Taking? Authorizing Provider  albuterol-ipratropium (COMBIVENT) 18-103 MCG/ACT inhaler Inhale 2 puffs into the lungs every 6 (six) hours as needed. For shortness of breath   Yes Historical Provider, MD  carbamazepine (TEGRETOL XR) 200 MG 12 hr tablet Take 200-400 mg by mouth 2 (two) times daily. 200mg  in am and 400mg  at bedtime   Yes Historical  Provider, MD  DULoxetine (CYMBALTA) 30 MG capsule Take 90 mg by mouth daily.   Yes Historical Provider, MD  fenofibrate micronized (LOFIBRA) 134 MG capsule Take 134 mg by mouth daily before breakfast.   Yes Historical Provider, MD  fluticasone (FLONASE) 50 MCG/ACT nasal spray Place 2 sprays into the nose daily.   Yes Historical Provider, MD  HYDROcodone-acetaminophen (NORCO) 10-325 MG per tablet Take 1 tablet by mouth every 4 (four) hours as needed.   Yes Historical Provider, MD  LORazepam (ATIVAN) 0.5 MG tablet Take 0.5 mg by mouth every 6 (six) hours as needed. For anxiety   Yes Historical Provider, MD  metoprolol (TOPROL-XL) 200 MG 24 hr tablet Take 200 mg by mouth every morning.   Yes Historical Provider, MD  pregabalin  (LYRICA) 75 MG capsule Take 75 mg by mouth 2 (two) times daily.   Yes Historical Provider, MD  promethazine (PHENERGAN) 25 MG tablet Take 25 mg by mouth every 6 (six) hours as needed. For nausea.   Yes Historical Provider, MD  verapamil (COVERA HS) 240 MG (CO) 24 hr tablet Take 240 mg by mouth at bedtime.   Yes Historical Provider, MD    Allergies:  Allergies  Allergen Reactions  . Imitrex (Sumatriptan Base) Nausea And Vomiting    Rapid heart beat.  [name] Ketorolac Tromethamine Nausea And Vomiting    Rapid heart beat.  . Stadol (Butorphanol Tartrate) Nausea And Vomiting  . Ivp Dye (Iodinated Diagnostic Agents) Nausea Only and Palpitations    Can tolerate iodine  . Shellfish Allergy Nausea Only and Palpitations    Social History:   reports that she has been smoking Cigarettes.  She has a 5.25 pack-year smoking history. She has never used smokeless tobacco. She reports that she does not drink alcohol or use illicit drugs.   Family History  Problem Relation Age of Onset  . Adopted: Yes  . Anesthesia problems Neg Hx   . Hypotension Neg Hx   . Malignant hyperthermia Neg Hx   . Pseudochol deficiency Neg Hx     Physical Exam:  GEN:  Pleasant 66 year old well nourished and well developed Caucasian Female examined  and in no acute distress; cooperative with exam Filed Vitals:   01/04/12 1500 01/04/12 1809 01/04/12 1938 01/04/12 2040  BP: 138/60  148/63   Pulse: 89  94 92  Temp:   99.2 F (37.3 C)   Resp: 20  18 20   SpO2: 98% 93% 93% 96%   Blood pressure 148/63, pulse 92, temperature 99.2 F (37.3 C), resp. rate 20, last menstrual period 06/04/2011, SpO2 96.00%. PSYCH: She is alert and oriented x4; does not appear anxious does not appear depressed; affect is normal HEENT: Normocephalic and Atraumatic, Mucous membranes pink; PERRLA; EOM intact; Fundi:  Benign;  No scleral icterus, Nares: Patent, Oropharynx: Clear, Fair Dentition, Neck:  FROM, no cervical lymphadenopathy nor thyromegaly  or carotid bruit; no JVD; Breasts:: Not examined CHEST WALL: No tenderness CHEST: Normal respiration, clear to auscultation bilaterally HEART: Regular rate and rhythm; no murmurs rubs or gallops BACK: No kyphosis or scoliosis; no CVA tenderness ABDOMEN: Positive Bowel Sounds, soft non-tender; no masses, no organomegaly, no pannus; no intertriginous candida. Rectal Exam: Not done EXTREMITIES: No bone or joint deformity; age-appropriate arthropathy of the hands and knees; no cyanosis, clubbing or edema; no ulcerations. Genitalia: not examined PULSES: 2+ and symmetric SKIN: Normal hydration no rash or ulceration CNS: Cranial nerves 2-12 grossly intact no focal neurologic deficit    Labs  on Admission:  Basic Metabolic Panel:  Lab 01/04/12 1610  NA 136  K 4.0  CL 101  CO2 18*  GLUCOSE 187*  BUN 10  CREATININE 0.42*  CALCIUM 9.2  MG --  PHOS --   Liver Function Tests: No results found for this basename: AST:5,ALT:5,ALKPHOS:5,BILITOT:5,PROT:5,ALBUMIN:5 in the last 168 hours No results found for this basename: LIPASE:5,AMYLASE:5 in the last 168 hours No results found for this basename: AMMONIA:5 in the last 168 hours CBC:  Lab 01/04/12 1805  WBC 8.7  NEUTROABS 8.0*  HGB 12.8  HCT 39.4  MCV 84.5  PLT 196   Cardiac Enzymes: No results found for this basename: CKTOTAL:5,CKMB:5,CKMBINDEX:5,TROPONINI:5 in the last 168 hours  BNP (last 3 results) No results found for this basename: PROBNP:3 in the last 8760 hours CBG: No results found for this basename: GLUCAP:5 in the last 168 hours  Radiological Exams on Admission: Dg Chest 2 View  01/04/2012  *RADIOLOGY REPORT*  Clinical Data: Cough and shortness of breath.  CHEST - 2 VIEW  Comparison: 10/11/2011 and 12/21/2011  Findings: Two views of the chest were obtained.  There are coarse lung markings bilaterally.  There is a 1.7 cm nodular density in the right lower lung that corresponds with the known pulmonary nodule.  Heart and  mediastinum are within normal limits.  No evidence for pleural effusions.  Bony thorax is intact.  IMPRESSION: Coarse lung markings most likely represent chronic changes but difficult to exclude mild edema.  There is no focal airspace disease.  Nodule in the right lower lung.  This was better characterized on the chest CT from 12/21/2011.   Original Report Authenticated By: Richarda Overlie, M.D.     EKG: Independently reviewed.   Assessment: Principal Problem:  *Asthmatic bronchitis Active Problems:  SOB (shortness of breath)  Hypoxemia  HTN  Fibromyalgia  Arthritis    Plan:     Admit to MED/SURG Bed IV Steroid Taper DUONEBS O2  PRN Reconcile Home Medications DVT Prophylaxis   Code Status:   FULL CODE Family Communication: N/A Disposition Plan: Return to Home  Time spent:  39 Minutes  Ron Parker [facility] Pager [phone]  If 7PM-7AM, please contact night-coverage [email or web address] Password [email or web address] 01/04/2012, 9:00 PM

## 2012-01-04 NOTE — ED Notes (Signed)
ZOX:[id number]<AV> Expected date:<BR> Expected time:<BR> Means of arrival:<BR> Comments:<BR> Sob, wheezing, stable

## 2012-01-04 NOTE — ED Notes (Signed)
Per EMS pt reports SOB x 3 days. EMS has given 10 mg albuterol 0.5 mg of Atrovent and 125mg  solumedrol. 20 guage in right AC. Sats 98% room air.

## 2012-01-04 NOTE — ED Notes (Signed)
[employer] given as requested.  Tolerated fine

## 2012-01-04 NOTE — ED Provider Notes (Signed)
History     CSN: [id number]  Arrival date & time 01/04/12  1448   First MD Initiated Contact with Patient 01/04/12 1506      Chief Complaint  Patient presents with  . Shortness of Breath    (Consider location/radiation/quality/duration/timing/severity/associated sxs/prior treatment) HPI Comments: Pt with h/o smoking, has had bronchitis in the past, owns an inhaler at home, also with h/o PE s/p IVF filter, had B knee replacements in September, known pulmonary lung nodule, has had pneumonia in the past, reports she is quitting smoking starting tomorrow, is wearing a nicotine patch, has had many sick contacts due to relatives and grandchildren, with worsening SOB, worse with exertion in the past 3 days, coughing, chest tightness, no fevers, sweats, weight loss, nausea.  Not improved with home inhaler.  Takes vicodin at home for knee pain, hasn't helped cough.  She has been hospitalized in the past, never in ICU, has been on prednisone in the past.  Givne neb and IVF's and IV solumedrol by EMS and reports improvement at this time.    Patient is a 66 y.o. female presenting with shortness of breath. The history is provided by the patient.  Shortness of Breath  Associated symptoms include cough and shortness of breath. Pertinent negatives include no fever and no rhinorrhea.    Past Medical History  Diagnosis Date  . Complication of anesthesia     woke up during previous surgery  . Cancer     breast hx  . Dysrhythmia     tachycardia  . Hypertension     take verapamil and toprol daily  . Bronchitis   . Seasonal allergies     flonase prn  . Pulmonary embolism 2006    hx of   . Blood transfusion 1974  . Ulcer, gastric, acute     hx of  . Fibromyalgia   . Mental disorder     hx of ptsd   . Lung mass April 2013    Right lower lobe lung mass  . Pneumonia     hx of.  5 x in life time.  [name] Head injury, closed, with concussion   . Kidney stone     hx of  . Headache     migranes,  last one was in Jun 12, 2011  . Arthritis     Past Surgical History  Procedure Date  . Cholecystectomy 1994  . Back surgery 2000/2010    fusion  . Tonsillectomy 1967  . Appendectomy 1965  . Cystoscopy     multiple  . Urethral dilation 1971  . Breast surgery     bilateral partial masectomy  . Abdominal hysterectomy 1992  . Dilation and curettage of uterus   . Hernia repair   . Colonoscopy   . Knee arthroscopy   . Total knee arthroplasty 06/04/2011    Procedure: TOTAL KNEE ARTHROPLASTY;  Surgeon: Raymon Mutton, MD;  Location: [facility] OR;  Service: Orthopedics;  Laterality: Right;  . Vena cava filter placement 2006  . Incisional hernia repair   . Multiple tooth extractions   . Joint replacement   . Total knee arthroplasty 10/15/2011    Procedure: TOTAL KNEE ARTHROPLASTY;  Surgeon: Raymon Mutton, MD;  Location: [facility] OR;  Service: Orthopedics;  Laterality: Left;  . Ivc filter     Family History  Problem Relation Age of Onset  . Adopted: Yes  . Anesthesia problems Neg Hx   . Hypotension Neg Hx   . Malignant hyperthermia Neg  Hx   . Pseudochol deficiency Neg Hx     History  Substance Use Topics  . Smoking status: Current Every Day Smoker -- 0.7 packs/day for 7 years    Types: Cigarettes  . Smokeless tobacco: Not on file  . Alcohol Use: No    OB History    Grav Para Term Preterm Abortions TAB SAB Ect Mult Living                  Review of Systems  Constitutional: Negative for fever, chills, diaphoresis, activity change and unexpected weight change.  HENT: Negative for congestion and rhinorrhea.   Respiratory: Positive for cough, chest tightness and shortness of breath.   Cardiovascular: Negative for palpitations and leg swelling.  Gastrointestinal: Negative for nausea, vomiting, abdominal pain and diarrhea.  Musculoskeletal: Negative for back pain.  Neurological: Negative for light-headedness and headaches.  All other systems reviewed and are negative.    Allergies    Imitrex; Ketorolac tromethamine; Stadol; Ivp dye; and Shellfish allergy  Home Medications   Current Outpatient Rx  Name  Route  Sig  Dispense  Refill  . IPRATROPIUM-ALBUTEROL 18-103 MCG/ACT IN AERO   Inhalation   Inhale 2 puffs into the lungs every 6 (six) hours as needed. For shortness of breath         . CARBAMAZEPINE ER 200 MG PO TB12   Oral   Take 200-400 mg by mouth 2 (two) times daily. 200mg  in am and 400mg  at bedtime         . DULOXETINE HCL 30 MG PO CPEP   Oral   Take 90 mg by mouth daily.         . FENOFIBRATE MICRONIZED 134 MG PO CAPS   Oral   Take 134 mg by mouth daily before breakfast.         . FLUTICASONE PROPIONATE 50 MCG/ACT NA SUSP   Nasal   Place 2 sprays into the nose daily.         [name] HYDROCODONE-ACETAMINOPHEN 10-325 MG PO TABS   Oral   Take 1 tablet by mouth every 4 (four) hours as needed.         [name] LORAZEPAM 0.5 MG PO TABS   Oral   Take 0.5 mg by mouth every 6 (six) hours as needed. For anxiety         . METOPROLOL SUCCINATE ER 200 MG PO TB24   Oral   Take 200 mg by mouth every morning.         [name] PREGABALIN 75 MG PO CAPS   Oral   Take 75 mg by mouth 2 (two) times daily.         [name] PROMETHAZINE HCL 25 MG PO TABS   Oral   Take 25 mg by mouth every 6 (six) hours as needed. For nausea.         [name] VERAPAMIL HCL ER (CO) 240 MG PO TB24   Oral   Take 240 mg by mouth at bedtime.           BP 138/60  Pulse 89  Resp 20  SpO2 93%  LMP 06/04/2011  Physical Exam  Nursing note and vitals reviewed. Constitutional: She is oriented to person, place, and time. She appears well-developed and well-nourished.  HENT:  Head: Normocephalic and atraumatic.  Eyes: Pupils are equal, round, and reactive to light.  Neck: Normal range of motion. Neck supple.  Cardiovascular: Normal rate.   Pulmonary/Chest: She has wheezes. She  has no rales.  Abdominal: Soft. She exhibits no distension. There is no tenderness.  Neurological: She is alert  and oriented to person, place, and time.  Skin: Skin is warm and dry. No rash noted.  Psychiatric: She has a normal mood and affect.    ED Course  Procedures (including critical care time)   Labs Reviewed  CBC WITH DIFFERENTIAL  BASIC METABOLIC PANEL  URINALYSIS, ROUTINE W REFLEX MICROSCOPIC   Dg Chest 2 View  01/04/2012  *RADIOLOGY REPORT*  Clinical Data: Cough and shortness of breath.  CHEST - 2 VIEW  Comparison: 10/11/2011 and 12/21/2011  Findings: Two views of the chest were obtained.  There are coarse lung markings bilaterally.  There is a 1.7 cm nodular density in the right lower lung that corresponds with the known pulmonary nodule.  Heart and mediastinum are within normal limits.  No evidence for pleural effusions.  Bony thorax is intact.  IMPRESSION: Coarse lung markings most likely represent chronic changes but difficult to exclude mild edema.  There is no focal airspace disease.  Nodule in the right lower lung.  This was better characterized on the chest CT from 12/21/2011.   Original Report Authenticated By: Richarda Overlie, M.D.    I reviewed 2 view CXR myself.  1. Bronchitis   2. Respiratory distress   3. Hypoxemia      98% on O2 is adequate by my interpretation    5:50 PM After repeat nebs and time for IV steroids to take effect, pt has had little to no clinical improvement, RA sats are 90-92%, pt still feels SOB, tried to walk to bathroom and got profoundly SOB.  Will admit for hypoxemia and resp distress.     MDM  Pt with diffuse exp wheezing, mild tachypnea, had moderate bronchitis and asthma/COPD exacerbation likely due to URI and cold weather.  No fever.  No focal abn on auscultation, doubt pneumonia.  Would benefit for steroid burst and continued breathing treatments both short term and intermediate time frame.          Gavin Pound. Meggan Dhaliwal, MD 01/04/12 1842

## 2012-01-04 NOTE — ED Notes (Signed)
O2 at 2 L/[place] placed for O2sats of 86-89% on RA.  Sats up to 92-93% on 2L/O2

## 2012-01-05 DIAGNOSIS — J40 Bronchitis, not specified as acute or chronic: Secondary | ICD-10-CM

## 2012-01-05 DIAGNOSIS — Z720 Tobacco use: Secondary | ICD-10-CM | POA: Diagnosis present

## 2012-01-05 DIAGNOSIS — J9600 Acute respiratory failure, unspecified whether with hypoxia or hypercapnia: Principal | ICD-10-CM

## 2012-01-05 DIAGNOSIS — J441 Chronic obstructive pulmonary disease with (acute) exacerbation: Secondary | ICD-10-CM | POA: Diagnosis present

## 2012-01-05 DIAGNOSIS — J9601 Acute respiratory failure with hypoxia: Secondary | ICD-10-CM | POA: Diagnosis present

## 2012-01-05 LAB — CBC
HCT: 39.9 % (ref 36.0–46.0)
MCV: 84.2 fL (ref 78.0–100.0)
RDW: 13.8 % (ref 11.5–15.5)
WBC: 12 10*3/uL — ABNORMAL HIGH (ref 4.0–10.5)

## 2012-01-05 LAB — BASIC METABOLIC PANEL
CO2: 21 mEq/L (ref 19–32)
Chloride: 101 mEq/L (ref 96–112)
Creatinine, Ser: 0.41 mg/dL — ABNORMAL LOW (ref 0.50–1.10)

## 2012-01-05 MED ORDER — METHOCARBAMOL 500 MG PO TABS
500.0000 mg | ORAL_TABLET | Freq: Four times a day (QID) | ORAL | Status: DC | PRN
  Administered 2012-01-05 – 2012-01-08 (×10): 500 mg via ORAL
  Filled 2012-01-05 (×10): qty 1

## 2012-01-05 MED ORDER — PREGABALIN 50 MG PO CAPS
75.0000 mg | ORAL_CAPSULE | Freq: Two times a day (BID) | ORAL | Status: DC
  Administered 2012-01-05 – 2012-01-12 (×15): 75 mg via ORAL
  Filled 2012-01-05 (×15): qty 1

## 2012-01-05 MED ORDER — FENOFIBRATE 160 MG PO TABS
160.0000 mg | ORAL_TABLET | Freq: Every day | ORAL | Status: DC
  Administered 2012-01-05 – 2012-01-12 (×8): 160 mg via ORAL
  Filled 2012-01-05 (×8): qty 1

## 2012-01-05 MED ORDER — FLUTICASONE PROPIONATE 50 MCG/ACT NA SUSP
2.0000 | Freq: Every day | NASAL | Status: DC
  Administered 2012-01-05 – 2012-01-12 (×7): 2 via NASAL
  Filled 2012-01-05: qty 16

## 2012-01-05 MED ORDER — PROMETHAZINE HCL 25 MG PO TABS
25.0000 mg | ORAL_TABLET | Freq: Four times a day (QID) | ORAL | Status: DC | PRN
  Administered 2012-01-05 – 2012-01-07 (×2): 25 mg via ORAL
  Filled 2012-01-05 (×2): qty 1

## 2012-01-05 MED ORDER — ALBUTEROL SULFATE (5 MG/ML) 0.5% IN NEBU
2.5000 mg | INHALATION_SOLUTION | RESPIRATORY_TRACT | Status: DC | PRN
  Administered 2012-01-05: 2.5 mg via RESPIRATORY_TRACT
  Filled 2012-01-05: qty 0.5

## 2012-01-05 MED ORDER — IPRATROPIUM BROMIDE 0.02 % IN SOLN
0.5000 mg | RESPIRATORY_TRACT | Status: DC | PRN
  Administered 2012-01-05: 0.5 mg via RESPIRATORY_TRACT
  Filled 2012-01-05: qty 2.5

## 2012-01-05 MED ORDER — HYDROCODONE-ACETAMINOPHEN 10-325 MG PO TABS
1.0000 | ORAL_TABLET | ORAL | Status: DC | PRN
  Administered 2012-01-05: 1 via ORAL
  Administered 2012-01-05 (×2): 2 via ORAL
  Administered 2012-01-05: 1 via ORAL
  Administered 2012-01-05 – 2012-01-12 (×20): 2 via ORAL
  Administered 2012-01-12: 1 via ORAL
  Filled 2012-01-05 (×5): qty 2
  Filled 2012-01-05: qty 1
  Filled 2012-01-05 (×17): qty 2
  Filled 2012-01-05 (×2): qty 1

## 2012-01-05 MED ORDER — NICOTINE 21 MG/24HR TD PT24
21.0000 mg | MEDICATED_PATCH | Freq: Every day | TRANSDERMAL | Status: DC
  Administered 2012-01-05 – 2012-01-12 (×8): 21 mg via TRANSDERMAL
  Filled 2012-01-05 (×8): qty 1

## 2012-01-05 MED ORDER — CARBAMAZEPINE ER 400 MG PO TB12
400.0000 mg | ORAL_TABLET | Freq: Every day | ORAL | Status: DC
  Administered 2012-01-05 – 2012-01-11 (×7): 400 mg via ORAL
  Filled 2012-01-05 (×8): qty 1

## 2012-01-05 MED ORDER — LORAZEPAM 0.5 MG PO TABS
0.5000 mg | ORAL_TABLET | Freq: Four times a day (QID) | ORAL | Status: DC | PRN
  Administered 2012-01-05 – 2012-01-12 (×22): 0.5 mg via ORAL
  Filled 2012-01-05 (×22): qty 1

## 2012-01-05 MED ORDER — HYDROMORPHONE HCL PF 1 MG/ML IJ SOLN
0.5000 mg | INTRAMUSCULAR | Status: DC | PRN
  Administered 2012-01-07 (×2): 0.5 mg via INTRAVENOUS
  Administered 2012-01-07 – 2012-01-11 (×14): 1 mg via INTRAVENOUS
  Filled 2012-01-05 (×16): qty 1

## 2012-01-05 MED ORDER — METOPROLOL SUCCINATE ER 100 MG PO TB24: 200.0000 mg | ORAL_TABLET | ORAL | Status: DC

## 2012-01-05 MED ORDER — VERAPAMIL HCL 240 MG (CO) PO TB24: 240.0000 mg | ORAL_TABLET | Freq: Every day | ORAL | Status: DC

## 2012-01-05 MED ORDER — VERAPAMIL HCL ER 240 MG PO TBCR
240.0000 mg | EXTENDED_RELEASE_TABLET | Freq: Every day | ORAL | Status: DC
  Administered 2012-01-05 – 2012-01-11 (×7): 240 mg via ORAL
  Filled 2012-01-05 (×8): qty 1

## 2012-01-05 MED ORDER — CARBAMAZEPINE ER 200 MG PO TB12
200.0000 mg | ORAL_TABLET | Freq: Every day | ORAL | Status: DC
  Administered 2012-01-05 – 2012-01-12 (×8): 200 mg via ORAL
  Filled 2012-01-05 (×8): qty 1

## 2012-01-05 MED ORDER — DULOXETINE HCL 60 MG PO CPEP
90.0000 mg | ORAL_CAPSULE | Freq: Every day | ORAL | Status: DC
  Administered 2012-01-05 – 2012-01-12 (×8): 90 mg via ORAL
  Filled 2012-01-05 (×8): qty 1

## 2012-01-05 MED ORDER — METOPROLOL SUCCINATE ER 100 MG PO TB24
200.0000 mg | ORAL_TABLET | Freq: Every day | ORAL | Status: DC
  Filled 2012-01-05: qty 2

## 2012-01-05 NOTE — Progress Notes (Signed)
Patient ID: Breanna Dixon, female   DOB: 08-Mar-1945, 66 y.o.   MRN: [id number] [facility] PROGRESS NOTE  Breanna Dixon GMW:[id number] DOB: Mar 20, 1945 DOA: 01/04/2012 PCP: Mattie Marlin, MD  Brief narrative: Pt is 66 yo female, chronic and heavy smoker who presented to [facility] ED with main concern of progressively worsening shortness of breath at rest and worse with even minimal exertion that initially started 3-4 days prior to admission and associated with productive cough, subjective fevers, chills, chest discomfort with coughing.   Principal Problem:  *Acute respiratory failure with hypoxia - secondary to COPD exacerbation in pt with heavy smoking history - pt is still actively smoking - will continue supportive care with nebulizers, solumedrol, oxygen via [place] - monitor vital signs as per floor protocol Active Problems:  COPD (chronic obstructive pulmonary disease) - in the setting of chronic tobacco abuse - maintaining oxygen saturations > 95% on 2 L - may need oxygen upon discharge  - will hold Metoprolol for now to avoid further exacerbation of respiratory condition   Tobacco abuse - discussed cessation in detail - nicotine patch   Consultants:  None  Procedures/Studies: Dg Chest 2 View 01/04/2012    Coarse lung markings most likely represent chronic changes but difficult to exclude mild edema.    There is no focal airspace disease.    Nodule in the right lower lung.  This was better characterized on the chest CT from 12/21/2011.   Antibiotics:  Zithromax 11/29 -->  Code Status: Full Family Communication: Pt at bedside Disposition Plan: Home when medically stable  HPI/Subjective: No events overnight.   Objective: Filed Vitals:   01/05/12 0630 01/05/12 0924 01/05/12 0931 01/05/12 1115  BP: 139/57   144/60  Pulse: 91   89  Temp: 99 F (37.2 C)   98.2 F (36.8 C)  TempSrc:    Oral  Resp: 18   20  Height:      Weight:      SpO2: 95% 94% 94% 92%   No  intake or output data in the 24 hours ending 01/05/12 1324  Exam:   General:  Pt is alert, follows commands appropriately, not in acute distress  Cardiovascular: Regular rate and rhythm, S1/S2, no murmurs, no rubs, no gallops  Respiratory: Expiratory wheezing, no rhonchi and no respiratory distress   Abdomen: Soft, non tender, non distended, bowel sounds present, no guarding  Extremities: No edema, pulses DP and PT palpable bilaterally  Neuro: Grossly nonfocal  Data Reviewed: Basic Metabolic Panel:  Lab 01/05/12 4403 01/04/12 1805  NA 136 136  K 3.5 4.0  CL 101 101  CO2 21 18*  GLUCOSE 185* 187*  BUN 10 10  CREATININE 0.41* 0.42*  CALCIUM 9.3 9.2  MG -- --  PHOS -- --    CBC:  Lab 01/05/12 0623 01/04/12 1805  WBC 12.0* 8.7  NEUTROABS -- 8.0*  HGB 13.2 12.8  HCT 39.9 39.4  MCV 84.2 84.5  PLT 214 196   Scheduled Meds:   . ipratropium  0.5 mg Nebulization Q4H  . albuterol  2.5 mg Nebulization Q4H  . azithromycin  500 mg Intravenous Q24H  . carbamazepine  200 mg Oral Daily  . carbamazepine  400 mg Oral QHS  . DULoxetine  90 mg Oral Daily  . enoxaparin injection  40 mg Subcutaneous Q24H  . fenofibrate  160 mg Oral Daily  . fluticasone  2 spray Each Nare Daily  . SOLU-MEDROL injection  125 mg Intravenous Q6H  .  SOLU-MEDROL injection  80 mg Intravenous Q8H  . nicotine  21 mg Transdermal Daily  . pregabalin  75 mg Oral BID  . verapamil  240 mg Oral QHS   Continuous Infusions:   . [DISCONTINUED] sodium chloride 75 mL/hr at 01/04/12 2210     Debbora Presto, MD  [facility] Pager [phone]  If 7PM-7AM, please contact night-coverage [email or web address] Password [email or web address] 01/05/2012, 1:24 PM   LOS: 1 day

## 2012-01-06 LAB — BLOOD GAS, ARTERIAL
Bicarbonate: 22.5 mEq/L (ref 20.0–24.0)
O2 Saturation: 94.1 %
Patient temperature: 98.6
TCO2: 19.8 mmol/L (ref 0–100)
pO2, Arterial: 71.9 mmHg — ABNORMAL LOW (ref 80.0–100.0)

## 2012-01-06 LAB — BASIC METABOLIC PANEL
BUN: 15 mg/dL (ref 6–23)
CO2: 24 mEq/L (ref 19–32)
Calcium: 9.2 mg/dL (ref 8.4–10.5)
GFR calc non Af Amer: >90 mL/min (ref >90)
Glucose, Bld: 182 mg/dL — ABNORMAL HIGH (ref 70–99)
Sodium: 137 mEq/L (ref 135–145)

## 2012-01-06 LAB — CBC
HCT: 38.7 % (ref 36.0–46.0)
Hemoglobin: 12.3 g/dL (ref 12.0–15.0)
MCH: 27.3 pg (ref 26.0–34.0)
MCHC: 31.8 g/dL (ref 30.0–36.0)
MCV: 86 fL (ref 78.0–100.0)
RBC: 4.5 MIL/uL (ref 3.87–5.11)

## 2012-01-06 MED ORDER — ALBUTEROL SULFATE (5 MG/ML) 0.5% IN NEBU
2.5000 mg | INHALATION_SOLUTION | RESPIRATORY_TRACT | Status: DC
  Administered 2012-01-06 – 2012-01-07 (×10): 2.5 mg via RESPIRATORY_TRACT
  Filled 2012-01-06 (×10): qty 0.5

## 2012-01-06 MED ORDER — IPRATROPIUM BROMIDE 0.02 % IN SOLN
0.5000 mg | RESPIRATORY_TRACT | Status: DC
  Administered 2012-01-06 – 2012-01-12 (×35): 0.5 mg via RESPIRATORY_TRACT
  Filled 2012-01-06 (×35): qty 2.5

## 2012-01-06 MED ORDER — METHYLPREDNISOLONE SODIUM SUCC 125 MG IJ SOLR
80.0000 mg | Freq: Four times a day (QID) | INTRAMUSCULAR | Status: DC
  Administered 2012-01-06 – 2012-01-08 (×8): 80 mg via INTRAVENOUS
  Filled 2012-01-06 (×12): qty 1.28

## 2012-01-06 MED ORDER — TIOTROPIUM BROMIDE MONOHYDRATE 18 MCG IN CAPS
18.0000 ug | ORAL_CAPSULE | Freq: Every day | RESPIRATORY_TRACT | Status: DC
  Administered 2012-01-10 – 2012-01-12 (×3): 18 ug via RESPIRATORY_TRACT
  Filled 2012-01-06: qty 5

## 2012-01-06 MED ORDER — LEVOFLOXACIN IN D5W 750 MG/150ML IV SOLN
750.0000 mg | INTRAVENOUS | Status: DC
  Administered 2012-01-06 – 2012-01-09 (×4): 750 mg via INTRAVENOUS
  Filled 2012-01-06 (×4): qty 150

## 2012-01-06 MED ORDER — POTASSIUM CHLORIDE CRYS ER 20 MEQ PO TBCR
40.0000 meq | EXTENDED_RELEASE_TABLET | Freq: Two times a day (BID) | ORAL | Status: AC
  Administered 2012-01-06 (×2): 40 meq via ORAL
  Filled 2012-01-06 (×3): qty 2

## 2012-01-06 NOTE — Progress Notes (Signed)
Patient ID: Breanna Dixon, female   DOB: 1945-08-28, 66 y.o.   MRN: [id number] [facility] PROGRESS NOTE  Breanna Dixon JYN:[id number] DOB: 19-Mar-1945 DOA: 01/04/2012 PCP: Mattie Marlin, MD  Brief narrative:  Pt is 66 yo female, chronic and heavy smoker who presented to [facility] ED with main concern of progressively worsening shortness of breath at rest and worse with even minimal exertion that initially started 3-4 days prior to admission and associated with productive cough, subjective fevers, chills, chest discomfort with coughing.   Principal Problem:  *Acute respiratory failure with hypoxia  - secondary to COPD exacerbation in pt with heavy smoking history  - pt is still actively smoking  - will continue supportive care with nebulizers, solumedrol, oxygen via [place]  - will also check ABG as pt is still hypoxic with supportive care - monitor vital signs as per floor protocol  Active Problems:  COPD (chronic obstructive pulmonary disease)  - in the setting of chronic tobacco abuse  - maintaining oxygen saturations > 95% on 2 L  - may need oxygen upon discharge  - continue nebs scheduled and prn  Tobacco abuse  - discussed cessation in detail  - nicotine patch  Hypokalemia  - secondary to beta agonist use - will supplement - bmp in am  Consultants:  None  Procedures/Studies:  Dg Chest 2 View  01/04/2012  Coarse lung markings most likely represent chronic changes but difficult to exclude mild edema.  There is no focal airspace disease.  Nodule in the right lower lung. This was better characterized on the chest CT from 12/21/2011.   Antibiotics:  Zithromax 11/29 --> 12/01 Levaquin 12/01 -->  Code Status: Full  Family Communication: Pt at bedside  Disposition Plan: Home when medically stable  HPI/Subjective: No events overnight.   Objective: Filed Vitals:   01/06/12 0040 01/06/12 0200 01/06/12 0440 01/06/12 0600  BP:  136/64  121/66  Pulse:  84  90  Temp:  97.5  F (36.4 C)  97.6 F (36.4 C)  TempSrc:  Oral  Oral  Resp:  20  20  Height:      Weight:      SpO2: 96% 98% 97% 97%    Intake/Output Summary (Last 24 hours) at 01/06/12 0710 Last data filed at 01/05/12 1500  Gross per 24 hour  Intake    240 ml  Output      0 ml  Net    240 ml    Exam:   General:  Pt is alert, follows commands appropriately, not in acute distress  Cardiovascular: Regular rate and rhythm, S1/S2, no murmurs, no rubs, no gallops  Respiratory: Inspiratory and expiratory wheezing with rhonchi   Abdomen: Soft, non tender, non distended, bowel sounds present, no guarding  Extremities: No edema, pulses DP and PT palpable bilaterally  Neuro: Grossly nonfocal  Data Reviewed: Basic Metabolic Panel:  Lab 01/06/12 8657 01/05/12 0623 01/04/12 1805  NA 137 136 136  K 2.9* 3.5 4.0  CL 100 101 101  CO2 24 21 18*  GLUCOSE 182* 185* 187*  BUN 15 10 10   CREATININE 0.48* 0.41* 0.42*  CALCIUM 9.2 9.3 9.2  MG -- -- --  PHOS -- -- --   CBC:  Lab 01/06/12 0542 01/05/12 0623 01/04/12 1805  WBC 13.3* 12.0* 8.7  NEUTROABS -- -- 8.0*  HGB 12.3 13.2 12.8  HCT 38.7 39.9 39.4  MCV 86.0 84.2 84.5  PLT 235 214 196   Scheduled Meds:   . albuterol  2.5 mg Nebulization Q4H WA  . azithromycin  500 mg Intravenous Q24H  . carbamazepine  200 mg Oral Daily  . carbamazepine  400 mg Oral QHS  . DULoxetine  90 mg Oral Daily  . enoxaparin (LOVENOX) injection  40 mg Subcutaneous Q24H  . fenofibrate  160 mg Oral Daily  . fluticasone  2 spray Each Nare Daily  . ipratropium  0.5 mg Nebulization Q4H WA  . [COMPLETED] methylPREDNISolone (SOLU-MEDROL) injection  125 mg Intravenous Q6H  . methylPREDNISolone (SOLU-MEDROL) injection  80 mg Intravenous Q8H  . nicotine  21 mg Transdermal Daily  . pregabalin  75 mg Oral BID  . verapamil  240 mg Oral QHS  . [DISCONTINUED] albuterol  2.5 mg Nebulization Q4H  . [DISCONTINUED] ipratropium  0.5 mg Nebulization Q4H  . [DISCONTINUED]  metoprolol  200 mg Oral BH-q7a  . [DISCONTINUED] metoprolol succinate  200 mg Oral Daily  . [DISCONTINUED] verapamil  240 mg Oral QHS   Continuous Infusions:   . [DISCONTINUED] sodium chloride 75 mL/hr at 01/04/12 2210     Debbora Presto, MD  [facility] Pager [phone]  If 7PM-7AM, please contact night-coverage [email or web address] Password [email or web address] 01/06/2012, 7:10 AM   LOS: 2 days

## 2012-01-06 NOTE — Progress Notes (Signed)
MD D/C patients breathing treatments yesterday. Rt was called yesterday evening and patient stated she wanted scheduled treatments. Patient was wheezing and had increased WOB. RT did a protocol assessment and rescheduled treatments.

## 2012-01-06 NOTE — Progress Notes (Signed)
INITIAL ADULT NUTRITION ASSESSMENT Date: 01/06/2012   Time: 1:19 PM  Reason for Assessment: Malnutrition screening tool, score of 3  INTERVENTION: 1. Patient advised on healthy ways to lose weight on a heart healthy diet (~1 pound/week), including eating 3 regular meals daily, reducing portion size, limiting fats and sodium, and increasing vegetables. She was advised that with increased demands of physical therapy for her knees, her calorie needs may be higher. Handouts provided.  2. RD will follow nutrition plan of care.   ASSESSMENT: Female 66 y.o.  Dx: Acute respiratory failure with hypoxia  Hx:  Past Medical History  Diagnosis Date  . Complication of anesthesia     woke up during previous surgery  . Cancer     breast hx  . Dysrhythmia     tachycardia  . Hypertension     take verapamil and toprol daily  . Bronchitis   . Seasonal allergies     flonase prn  . Pulmonary embolism 2006    hx of   . Blood transfusion 1974  . Ulcer, gastric, acute     hx of  . Fibromyalgia   . Mental disorder     hx of ptsd   . Lung mass April 2013    Right lower lobe lung mass  . Pneumonia     hx of.  5 x in life time.  [name] Head injury, closed, with concussion   . Kidney stone     hx of  . Headache     migranes, last one was in Jun 12, 2011  . Arthritis   . Hypertension     Related Meds:     . albuterol  2.5 mg Nebulization Q4H WA  . carbamazepine  200 mg Oral Daily  . carbamazepine  400 mg Oral QHS  . DULoxetine  90 mg Oral Daily  . enoxaparin (LOVENOX) injection  40 mg Subcutaneous Q24H  . fenofibrate  160 mg Oral Daily  . fluticasone  2 spray Each Nare Daily  . ipratropium  0.5 mg Nebulization Q4H WA  . levofloxacin (LEVAQUIN) IV  750 mg Intravenous Q24H  . [COMPLETED] methylPREDNISolone (SOLU-MEDROL) injection  125 mg Intravenous Q6H  . methylPREDNISolone (SOLU-MEDROL) injection  80 mg Intravenous Q6H  . nicotine  21 mg Transdermal Daily  . potassium chloride  40 mEq Oral  BID  . pregabalin  75 mg Oral BID  . tiotropium  18 mcg Inhalation Daily  . verapamil  240 mg Oral QHS  . [DISCONTINUED] albuterol  2.5 mg Nebulization Q4H  . [DISCONTINUED] azithromycin  500 mg Intravenous Q24H  . [DISCONTINUED] ipratropium  0.5 mg Nebulization Q4H  . [DISCONTINUED] methylPREDNISolone (SOLU-MEDROL) injection  80 mg Intravenous Q8H     Ht: 5' 7.5" (171.5 cm)  Wt: 188 lb 11.2 oz (85.594 kg) (standing scale)  Ideal Wt: 62.75 kg  % Ideal Wt: 137%  Usual Wt: 100.5 kg % Usual Wt: 85%  Body mass index is 29.12 kg/(m^2). Patient is overweight.   Food/Nutrition Related Hx: Patient admitted with acute respiratory failure with history of TKA in 05/2011. She reports that her appetite has been fair since the surgery. She has lost 15% of her UBW since then, which is desired by patient, but unintentional. This meets the criteria for severe malnutrition in the context of chronic illness. She does express interest in losing weight in a healthful way.   Labs:  CMP     Component Value Date/Time   NA 137 01/06/2012 0542  K 2.9* 01/06/2012 0542   CL 100 01/06/2012 0542   CO2 24 01/06/2012 0542   GLUCOSE 182* 01/06/2012 0542   BUN 15 01/06/2012 0542   CREATININE 0.48* 01/06/2012 0542   CALCIUM 9.2 01/06/2012 0542   PROT 7.5 10/11/2011 1516   ALBUMIN 4.1 10/11/2011 1516   AST 19 10/11/2011 1516   ALT 14 10/11/2011 1516   ALKPHOS 87 10/11/2011 1516   BILITOT 0.2* 10/11/2011 1516   GFRNONAA >90 01/06/2012 0542   GFRAA >90 01/06/2012 0542     Intake/Output Summary (Last 24 hours) at 01/06/12 1324 Last data filed at 01/06/12 0900  Gross per 24 hour  Intake    480 ml  Output      0 ml  Net    480 ml    Diet Order: Cardiac, 100%  Supplements/Tube Feeding: none  IVF:    Estimated Nutritional Needs:   Kcal: 1750-1900 kcal Protein: 85-100 g Fluid: >2.6 L  NUTRITION DIAGNOSIS: -Malnutrition (NI-5.2).  Status: Ongoing  RELATED TO: decreased appetite  AS EVIDENCE BY: 15% weight loss  over 7 months and oral intake <75% of needs  MONITORING/EVALUATION(Goals): Patient will meet 90-100% of estimated nutrition needs  Monitor: PO intake, weight, labs, educational needs  EDUCATION NEEDS: -Education needs addressed    Linnell Fulling, RD, LDN Pager #: [phone] After-Hours Pager #: [phone]   DOCUMENTATION CODES Per approved criteria  -Severe malnutrition in the context of chronic illness -Obesity Unspecified    Fabio Pierce 01/06/2012, 1:19 PM

## 2012-01-07 LAB — BASIC METABOLIC PANEL WITH GFR
BUN: 13 mg/dL (ref 6–23)
CO2: 28 meq/L (ref 19–32)
Calcium: 9.6 mg/dL (ref 8.4–10.5)
Chloride: 100 meq/L (ref 96–112)
Creatinine, Ser: 0.46 mg/dL — ABNORMAL LOW (ref 0.50–1.10)
GFR calc Af Amer: >90 mL/min (ref >90)
GFR calc non Af Amer: >90 mL/min (ref >90)
Glucose, Bld: 182 mg/dL — ABNORMAL HIGH (ref 70–99)
Potassium: 4.4 meq/L (ref 3.5–5.1)
Sodium: 137 meq/L (ref 135–145)

## 2012-01-07 LAB — CBC
HCT: 39.6 % (ref 36.0–46.0)
Hemoglobin: 12.6 g/dL (ref 12.0–15.0)
RBC: 4.59 MIL/uL (ref 3.87–5.11)

## 2012-01-07 MED ORDER — ALBUTEROL SULFATE (5 MG/ML) 0.5% IN NEBU
2.5000 mg | INHALATION_SOLUTION | RESPIRATORY_TRACT | Status: DC
  Administered 2012-01-07 – 2012-01-12 (×26): 2.5 mg via RESPIRATORY_TRACT
  Filled 2012-01-07 (×28): qty 0.5

## 2012-01-07 NOTE — Progress Notes (Signed)
Patient ID: Demaria A [place], female   DOB: 1945/12/23, 66 y.o.   MRN: [id number]  [facility] PROGRESS NOTE  Lylia A [place] ION:[id number] DOB: 12-Sep-1945 DOA: 01/04/2012 PCP: Mattie Marlin, MD  Brief narrative:  Pt is 66 yo female, chronic and heavy smoker who presented to [facility] ED with main concern of progressively worsening shortness of breath at rest and worse with even minimal exertion that initially started 3-4 days prior to admission and associated with productive cough, subjective fevers, chills, chest discomfort with coughing.   Principal Problem:  *Acute respiratory failure with hypoxia  - secondary to COPD exacerbation in pt with heavy smoking history  - pt is still wheezing on exam this AM - will continue supportive care with nebulizers, solumedrol, oxygen via [place]  - monitor vital signs as per floor protocol  Active Problems:  COPD (chronic obstructive pulmonary disease)  - in the setting of chronic tobacco abuse  - maintaining oxygen saturations > 95% on 2 L  - may need oxygen upon discharge  - continue nebs scheduled and prn, antibiotic and solumedrol as noted above   Tobacco abuse  - discussed cessation in detail  - nicotine patch  Hypokalemia  - secondary to beta agonist use  - supplemented - bmp in am   Consultants:  None  Procedures/Studies:  Dg Chest 2 View  01/04/2012  Coarse lung markings most likely represent chronic changes but difficult to exclude mild edema.  There is no focal airspace disease.  Nodule in the right lower lung. This was better characterized on the chest CT from 12/21/2011.   Antibiotics:  Zithromax 11/29 --> 12/01  Levaquin 12/01 -->  Code Status: Full  Family Communication: Pt at bedside  Disposition Plan: Home when medically stable   HPI/Subjective: No events overnight. Pt reports feeling better.  Objective: Filed Vitals:   01/07/12 0200 01/07/12 0430 01/07/12 0600 01/07/12 0832  BP: 122/66  148/60   Pulse: 98  79     Temp: 98.6 F (37 C)  98.8 F (37.1 C)   TempSrc: Oral  Oral   Resp: 20  18   Height:      Weight:      SpO2: 98% 96% 96% 92%   No intake or output data in the 24 hours ending 01/07/12 1028  Exam:   General:  Pt is alert, follows commands appropriately, not in acute distress  Cardiovascular: Regular rate and rhythm, S1/S2, no murmurs, no rubs, no gallops  Respiratory: Expiratory and inspiratory wheezing but no respiratory distress   Abdomen: Soft, non tender, non distended, bowel sounds present, no guarding  Extremities: No edema, pulses DP and PT palpable bilaterally  Neuro: Grossly nonfocal  Data Reviewed: Basic Metabolic Panel:  Lab 01/07/12 2440 01/06/12 0542 01/05/12 0623 01/04/12 1805  NA 137 137 136 136  K 4.4 2.9* 3.5 4.0  CL 100 100 101 101  CO2 28 24 21  18*  GLUCOSE 182* 182* 185* 187*  BUN 13 15 10 10   CREATININE 0.46* 0.48* 0.41* 0.42*  CALCIUM 9.6 9.2 9.3 9.2  MG -- -- -- --  PHOS -- -- -- --   CBC:  Lab 01/07/12 0501 01/06/12 0542 01/05/12 0623 01/04/12 1805  WBC 13.2* 13.3* 12.0* 8.7  NEUTROABS -- -- -- 8.0*  HGB 12.6 12.3 13.2 12.8  HCT 39.6 38.7 39.9 39.4  MCV 86.3 86.0 84.2 84.5  PLT 251 235 214 196   Scheduled Meds:   . albuterol  2.5 mg Nebulization Q4H WA  .  carbamazepine  200 mg Oral Daily  . carbamazepine  400 mg Oral QHS  . DULoxetine  90 mg Oral Daily  . enoxaparin injection  40 mg Subcutaneous Q24H  . fenofibrate  160 mg Oral Daily  . fluticasone  2 spray Each Nare Daily  . ipratropium  0.5 mg Nebulization Q4H WA  . levofloxacin  IV  750 mg Intravenous Q24H  . methylPREDNISolone inj  80 mg Intravenous Q6H  . nicotine  21 mg Transdermal Daily  . potassium chloride  40 mEq Oral BID  . pregabalin  75 mg Oral BID  . tiotropium  18 mcg Inhalation Daily  . verapamil  240 mg Oral QHS   Continuous Infusions:    Debbora Presto, MD  [facility] Pager [phone]  If 7PM-7AM, please contact night-coverage [email or web address] Password  [email or web address] 01/07/2012, 10:28 AM   LOS: 3 days

## 2012-01-08 ENCOUNTER — Ambulatory Visit: Payer: Medicare Other | Attending: Orthopedic Surgery | Admitting: Physical Therapy

## 2012-01-08 DIAGNOSIS — IMO0001 Reserved for inherently not codable concepts without codable children: Secondary | ICD-10-CM | POA: Insufficient documentation

## 2012-01-08 DIAGNOSIS — M25569 Pain in unspecified knee: Secondary | ICD-10-CM | POA: Insufficient documentation

## 2012-01-08 DIAGNOSIS — Z96659 Presence of unspecified artificial knee joint: Secondary | ICD-10-CM | POA: Insufficient documentation

## 2012-01-08 DIAGNOSIS — R262 Difficulty in walking, not elsewhere classified: Secondary | ICD-10-CM | POA: Insufficient documentation

## 2012-01-08 DIAGNOSIS — M25669 Stiffness of unspecified knee, not elsewhere classified: Secondary | ICD-10-CM | POA: Insufficient documentation

## 2012-01-08 LAB — BASIC METABOLIC PANEL
BUN: 17 mg/dL (ref 6–23)
CO2: 27 mEq/L (ref 19–32)
Calcium: 9.7 mg/dL (ref 8.4–10.5)
Chloride: 98 mEq/L (ref 96–112)
Creatinine, Ser: 0.41 mg/dL — ABNORMAL LOW (ref 0.50–1.10)
GFR calc Af Amer: >90 mL/min (ref >90)
GFR calc non Af Amer: >90 mL/min (ref >90)
Glucose, Bld: 221 mg/dL — ABNORMAL HIGH (ref 70–99)
Potassium: 4.2 mEq/L (ref 3.5–5.1)
Sodium: 135 mEq/L (ref 135–145)

## 2012-01-08 LAB — CBC
HCT: 40.2 % (ref 36.0–46.0)
Hemoglobin: 12.9 g/dL (ref 12.0–15.0)
MCH: 27.4 pg (ref 26.0–34.0)
MCHC: 32.1 g/dL (ref 30.0–36.0)
MCV: 85.5 fL (ref 78.0–100.0)
Platelets: 277 10*3/uL (ref 150–400)
RBC: 4.7 MIL/uL (ref 3.87–5.11)
RDW: 13.5 % (ref 11.5–15.5)
WBC: 11.5 10*3/uL — ABNORMAL HIGH (ref 4.0–10.5)

## 2012-01-08 MED ORDER — METHYLPREDNISOLONE SODIUM SUCC 125 MG IJ SOLR
80.0000 mg | Freq: Two times a day (BID) | INTRAMUSCULAR | Status: DC
  Administered 2012-01-08: 80 mg via INTRAVENOUS
  Filled 2012-01-08 (×3): qty 1.28

## 2012-01-08 NOTE — Progress Notes (Signed)
Inpatient Diabetes Program Recommendations  AACE/ADA: New Consensus Statement on Inpatient Glycemic Control (2013)  Target Ranges:  Prepandial:   less than 140 mg/dL      Peak postprandial:   less than 180 mg/dL (1-2 hours)      Critically ill patients:  140 - 180 mg/dL   Reason for Visit: Elevated Blood Sugars in am  66 y.o. female who presents to the ED with complaints of worsening SOB and wheezing  And cough for the past 4 days.  She began to have fevers today, the cough has been nonproductive.  Her grandchildren who are visiting are sick with colds.  She is Dixon heavy smoker and has weaned herself down to no cigarettes this past week and is on Dixon nicotine patch now.  She previously smoked 1-2 packs daily.  No hx of DM.  Results for [place], Breanna Dixon (MRN [id number]) as of 01/08/2012 10:34  Ref. Range 01/06/2012 05:42 01/07/2012 05:01 01/08/2012 05:28  Glucose Latest Range: 70-99 mg/dL 409 (H) 811 (H) 914 (H)     Inpatient Diabetes Program Recommendations HgbA1C: Check HgbA1C to assess glycemic control prior to hospitalization Diet: Add CHO-mod med to heart healthy diet  Note: Will continue to follow.

## 2012-01-08 NOTE — Progress Notes (Signed)
Patient ID: Breanna Dixon, female   DOB: July 17, 1945, 66 y.o.   MRN: [id number]  [facility] PROGRESS NOTE  Breanna Dixon WUJ:[id number] DOB: May 23, 1945 DOA: 01/04/2012 PCP: Mattie Marlin, MD  Brief narrative:  Pt is 66 yo female, chronic and heavy smoker who presented to [facility] ED with main concern of progressively worsening shortness of breath at rest and worse with even minimal exertion that initially started 3-4 days prior to admission and associated with productive cough, subjective fevers, chills, chest discomfort with coughing. Pt was started on Solumedrol and very poorly responsive initially, persistent inspiratory and expiratory wheezing for 3 days, much improved today 12/03.   Principal Problem:  *Acute respiratory failure with hypoxia  - secondary to COPD exacerbation in pt with heavy smoking history  - pt is still wheezing on exam this AM but it is mostly end expiration  - will continue supportive care with nebulizers, solumedrol, oxygen via [place]  - I suspect that will be able to transition to Prednisone in AM if pt not wheezing - monitor vital signs as per floor protocol  Active Problems:  COPD (chronic obstructive pulmonary disease)  - in the setting of chronic tobacco abuse  - maintaining oxygen saturations > 95% on 2 L  - may need oxygen upon discharge and PFT's - pt will also need to be referred to pulmonologist upon discharge  - continue nebs scheduled and prn, antibiotic and solumedrol as noted above  Tobacco abuse  - discussed cessation in detail  - nicotine patch  Hypokalemia  - secondary to beta agonist use  - supplemented and within normal limits this am - bmp in am   Consultants:  None  Procedures/Studies:  Dg Chest 2 View  01/04/2012  Coarse lung markings most likely represent chronic changes but difficult to exclude mild edema.  There is no focal airspace disease.  Nodule in the right lower lung. This was better characterized on the chest CT from  12/21/2011.   Antibiotics:  Zithromax 11/29 --> 12/01  Levaquin 12/01 -->  Code Status: Full  Family Communication: Pt at bedside  Disposition Plan: Home when medically stable, hopefully in 1-2 days    Objective: Filed Vitals:   01/08/12 0321 01/08/12 0600 01/08/12 0745 01/08/12 1023  BP:  148/68  139/79  Pulse: 84 98  84  Temp:  98.2 F (36.8 C)  98.1 F (36.7 C)  TempSrc:  Oral  Oral  Resp: 18 18  20   Height:      Weight:      SpO2: 98% 97% 98% 98%    Intake/Output Summary (Last 24 hours) at 01/08/12 1106 Last data filed at 01/07/12 1300  Gross per 24 hour  Intake    240 ml  Output      0 ml  Net    240 ml    Exam:   General:  Pt is alert, follows commands appropriately, not in acute distress  Cardiovascular: Regular rate and rhythm, S1/S2, no murmurs, no rubs, no gallops  Respiratory: Clear to auscultation bilaterally, end expiratory wheezing still prominent, no respiratory distress   Abdomen: Soft, non tender, non distended, bowel sounds present, no guarding  Extremities: No edema, pulses DP and PT palpable bilaterally  Neuro: Grossly nonfocal  Data Reviewed: Basic Metabolic Panel:  Lab 01/08/12 9562 01/07/12 0501 01/06/12 0542 01/05/12 0623 01/04/12 1805  NA 135 137 137 136 136  K 4.2 4.4 2.9* 3.5 4.0  CL 98 100 100 101 101  CO2 27 28  24 21 18*  GLUCOSE 221* 182* 182* 185* 187*  BUN 17 13 15 10 10   CREATININE 0.41* 0.46* 0.48* 0.41* 0.42*  CALCIUM 9.7 9.6 9.2 9.3 9.2  MG -- -- -- -- --  PHOS -- -- -- -- --   CBC:  Lab 01/08/12 0528 01/07/12 0501 01/06/12 0542 01/05/12 0623 01/04/12 1805  WBC 11.5* 13.2* 13.3* 12.0* 8.7  NEUTROABS -- -- -- -- 8.0*  HGB 12.9 12.6 12.3 13.2 12.8  HCT 40.2 39.6 38.7 39.9 39.4  MCV 85.5 86.3 86.0 84.2 84.5  PLT 277 251 235 214 196   Scheduled Meds:   . albuterol  2.5 mg Nebulization Q4H  . carbamazepine  200 mg Oral Daily  . carbamazepine  400 mg Oral QHS  . DULoxetine  90 mg Oral Daily  . enoxaparin  injection  40 mg Subcutaneous Q24H  . fenofibrate  160 mg Oral Daily  . fluticasone  2 spray Each Nare Daily  . ipratropium  0.5 mg Nebulization Q4H WA  . levofloxacin IV  750 mg Intravenous Q24H  . SOLU-MEDR injection  80 mg Intravenous Q6H  . nicotine  21 mg Transdermal Daily  . pregabalin  75 mg Oral BID  . tiotropium  18 mcg Inhalation Daily  . verapamil  240 mg Oral QHS   Continuous Infusions:    Debbora Presto, MD  [facility] Pager [phone]  If 7PM-7AM, please contact night-coverage [email or web address] Password [email or web address] 01/08/2012, 11:06 AM   LOS: 4 days

## 2012-01-09 LAB — CBC
MCH: 27.6 pg (ref 26.0–34.0)
MCHC: 32.3 g/dL (ref 30.0–36.0)
MCV: 85.3 fL (ref 78.0–100.0)
Platelets: 329 10*3/uL (ref 150–400)
RBC: 4.75 MIL/uL (ref 3.87–5.11)
RDW: 13.6 % (ref 11.5–15.5)

## 2012-01-09 LAB — BASIC METABOLIC PANEL
CO2: 31 mEq/L (ref 19–32)
Calcium: 9.5 mg/dL (ref 8.4–10.5)
Creatinine, Ser: 0.48 mg/dL — ABNORMAL LOW (ref 0.50–1.10)
GFR calc non Af Amer: >90 mL/min (ref >90)

## 2012-01-09 MED ORDER — LEVOFLOXACIN 750 MG PO TABS
750.0000 mg | ORAL_TABLET | Freq: Every day | ORAL | Status: DC
  Administered 2012-01-10 – 2012-01-11 (×2): 750 mg via ORAL
  Filled 2012-01-09 (×3): qty 1

## 2012-01-09 MED ORDER — PREDNISONE 50 MG PO TABS
60.0000 mg | ORAL_TABLET | Freq: Two times a day (BID) | ORAL | Status: DC
  Administered 2012-01-09 – 2012-01-10 (×4): 60 mg via ORAL
  Filled 2012-01-09 (×6): qty 1

## 2012-01-09 NOTE — Progress Notes (Signed)
[facility] PROGRESS NOTE  Breanna Dixon MWU:[id number] DOB: 22-Feb-1945 DOA: 01/04/2012 PCP: Mattie Marlin, MD  Assessment/Plan: *Acute respiratory failure with hypoxia  - secondary to COPD exacerbation in pt with heavy smoking history  - No wheezing on my examination today. - will continue supportive care with nebulizers,  oxygen via [place]  - monitor vital signs as per floor protocol  -Change Solu-Medrol to by mouth prednisone -Plan for discharge tomorrow if clinically stable on prednisone Active Problems:  COPD (chronic obstructive pulmonary disease)  - in the setting of chronic tobacco abuse  - maintaining oxygen saturations > 95% on 2 L  -Currently on 30% Venturi mask which is equivalent to 2 1/2 liters nasal cannula - may need oxygen upon discharge--will need ambulatory pulse ox - continue nebs scheduled and prn, antibiotic and solumedrol as noted above -Change Levaquin to by mouth--tomorrow will be day #7 of abx, the final day  -Will need outpt PFTs Tobacco abuse  - discussed cessation in detail  - nicotine patch  Hypokalemia  - secondary to beta agonist use  - supplemented-improved - bmp in am  Right lower lobe lung nodule -CT scan 12/21/2011 show slight enlargement -Will need PET scan as outpt Consultants:  None Procedures/Studies:  Dg Chest 2 View  01/04/2012  Coarse lung markings most likely represent chronic changes but difficult to exclude mild edema.  There is no focal airspace disease.  Nodule in the right lower lung. This was better characterized on the chest CT from 12/21/2011.  Antibiotics:  Zithromax 11/29 --> 12/01  Levaquin 12/01 --> Code Status: Full  Family Communication: Pt at bedside  Disposition Plan: Home when medically stable          Procedures/Studies: Dg Chest 2 View  01/04/2012  *RADIOLOGY REPORT*  Clinical Data: Cough and shortness of breath.  CHEST - 2 VIEW  Comparison: 10/11/2011 and 12/21/2011  Findings: Two views of the  chest were obtained.  There are coarse lung markings bilaterally.  There is a 1.7 cm nodular density in the right lower lung that corresponds with the known pulmonary nodule.  Heart and mediastinum are within normal limits.  No evidence for pleural effusions.  Bony thorax is intact.  IMPRESSION: Coarse lung markings most likely represent chronic changes but difficult to exclude mild edema.  There is no focal airspace disease.  Nodule in the right lower lung.  This was better characterized on the chest CT from 12/21/2011.   Original Report Authenticated By: Richarda Overlie, M.D.    Ct Chest Wo Contrast  12/21/2011  *RADIOLOGY REPORT*  Clinical Data: Follow up of right lower lobe lung nodule, smoking history, history breast carcinoma in 2000 with lumpectomy  CT CHEST WITHOUT CONTRAST  Technique:  Multidetector CT imaging of the chest was performed following the standard protocol without IV contrast.  Comparison: CT of the chest of 05/30/2011  Findings: On the lung window images, the rounded nodule at the in the right lower lobe is relatively stable measuring 1.4 x 1.9 x 1.8 cm compared to 1.4 x 1.7 x 1.5 cm previously.  A calcified granuloma in the left lower lobe is stable as well.  There are multiple calcified hilar and mediastinal nodes present most consistent with prior granulomatous disease.  No new lung nodule is seen. The circumscribed nodule in the right lower lobe is larger than that a head CT may be help to assess for metabolic activity if indicated clinically.  No pleural effusion is noted.  On soft tissue window  images, asymmetric enlargement of the left lobe of thyroid is stable.  On this unenhanced study, no mediastinal or hilar adenopathy is seen, with calcified mediastinal and hilar nodes again noted consistent with prior granulomatous disease.  A calcified splenic granuloma also is present.  Surgical clips are noted from prior cholecystectomy.  There are calcified upper abdominal nodes present as well.   IMPRESSION:  1.  Stable well circumscribed nodule within the right lower lobe. Consider PET CT for metabolic activity if indicated clinically. 2.  Diffuse changes of prior granulomatous disease with calcified left lower lobe granulomas, mediastinal and hilar calcified nodes, calcified splenic granuloma, and calcified nodes in the upper abdomen.   Original Report Authenticated By: Dwyane Dee, M.D.          Subjective: Patient is doing overall better. Denies any fevers, chills, chest pain, nausea, vomiting, diarrhea, abdominal pain. No hemoptysis. Complains of dry cough.  Objective: Filed Vitals:   01/09/12 0200 01/09/12 0435 01/09/12 0600 01/09/12 0928  BP: 147/71  132/72   Pulse: 89  89   Temp: 98.5 F (36.9 C)  98 F (36.7 C)   TempSrc: Oral  Oral   Resp: 18  18   Height:      Weight:      SpO2: 97% 95% 94% 99%    Intake/Output Summary (Last 24 hours) at 01/09/12 1151 Last data filed at 01/08/12 1404  Gross per 24 hour  Intake    690 ml  Output      0 ml  Net    690 ml   Weight change:  Exam:   General:  Pt is alert, follows commands appropriately, not in acute distress  HEENT: No icterus, No thrush,  [place]/AT  Cardiovascular: RRR, S1/S2, no rubs, no gallops  Respiratory: Scattered rales bilaterally. Good air. No wheezing.  Abdomen: Soft/+BS, non tender, non distended, no guarding  Extremities: No edema, No lymphangitis, No petechiae, No rashes, no synovitis  Data Reviewed: Basic Metabolic Panel:  Lab 01/09/12 1610 01/08/12 0528 01/07/12 0501 01/06/12 0542 01/05/12 0623  NA 134* 135 137 137 136  K 4.4 4.2 4.4 2.9* 3.5  CL 94* 98 100 100 101  CO2 31 27 28 24 21   GLUCOSE 145* 221* 182* 182* 185*  BUN 22 17 13 15 10   CREATININE 0.48* 0.41* 0.46* 0.48* 0.41*  CALCIUM 9.5 9.7 9.6 9.2 9.3  MG -- -- -- -- --  PHOS -- -- -- -- --   Liver Function Tests: No results found for this basename: AST:5,ALT:5,ALKPHOS:5,BILITOT:5,PROT:5,ALBUMIN:5 in the last 168 hours No  results found for this basename: LIPASE:5,AMYLASE:5 in the last 168 hours No results found for this basename: AMMONIA:5 in the last 168 hours CBC:  Lab 01/09/12 0530 01/08/12 0528 01/07/12 0501 01/06/12 0542 01/05/12 0623 01/04/12 1805  WBC 10.9* 11.5* 13.2* 13.3* 12.0* --  NEUTROABS -- -- -- -- -- 8.0*  HGB 13.1 12.9 12.6 12.3 13.2 --  HCT 40.5 40.2 39.6 38.7 39.9 --  MCV 85.3 85.5 86.3 86.0 84.2 --  PLT 329 277 251 235 214 --   Cardiac Enzymes: No results found for this basename: CKTOTAL:5,CKMB:5,CKMBINDEX:5,TROPONINI:5 in the last 168 hours BNP: No components found with this basename: POCBNP:5 CBG: No results found for this basename: GLUCAP:5 in the last 168 hours  No results found for this or any previous visit (from the past 240 hour(s)).   Scheduled Meds:    . albuterol  2.5 mg Nebulization Q4H  . carbamazepine  200 mg  Oral Daily  . carbamazepine  400 mg Oral QHS  . DULoxetine  90 mg Oral Daily  . enoxaparin (LOVENOX) injection  40 mg Subcutaneous Q24H  . fenofibrate  160 mg Oral Daily  . fluticasone  2 spray Each Nare Daily  . ipratropium  0.5 mg Nebulization Q4H WA  . levofloxacin  750 mg Oral Daily  . nicotine  21 mg Transdermal Daily  . predniSONE  60 mg Oral BID WC  . pregabalin  75 mg Oral BID  . tiotropium  18 mcg Inhalation Daily  . verapamil  240 mg Oral QHS  . [DISCONTINUED] levofloxacin (LEVAQUIN) IV  750 mg Intravenous Q24H  . [DISCONTINUED] methylPREDNISolone (SOLU-MEDROL) injection  80 mg Intravenous Q6H  . [DISCONTINUED] methylPREDNISolone (SOLU-MEDROL) injection  80 mg Intravenous Q12H   Continuous Infusions:    Marysa Wessner, DO  [facility] Pager [phone]  If 7PM-7AM, please contact night-coverage [email or web address] Password [email or web address] 01/09/2012, 11:51 AM   LOS: 5 days

## 2012-01-10 LAB — BASIC METABOLIC PANEL
Calcium: 9.2 mg/dL (ref 8.4–10.5)
GFR calc Af Amer: >90 mL/min (ref >90)
GFR calc non Af Amer: >90 mL/min (ref >90)
Glucose, Bld: 135 mg/dL — ABNORMAL HIGH (ref 70–99)
Sodium: 135 mEq/L (ref 135–145)

## 2012-01-10 MED ORDER — PREDNISONE 50 MG PO TABS
60.0000 mg | ORAL_TABLET | Freq: Every day | ORAL | Status: DC
  Administered 2012-01-11 – 2012-01-12 (×2): 60 mg via ORAL
  Filled 2012-01-10 (×3): qty 1

## 2012-01-10 MED ORDER — FUROSEMIDE 20 MG PO TABS
20.0000 mg | ORAL_TABLET | Freq: Every day | ORAL | Status: DC
  Administered 2012-01-10 – 2012-01-12 (×3): 20 mg via ORAL
  Filled 2012-01-10 (×3): qty 1

## 2012-01-10 NOTE — Progress Notes (Signed)
[facility] PROGRESS NOTE  Breanna Dixon UJW:[id number] DOB: Mar 15, 1945 DOA: 01/04/2012 PCP: Mattie Marlin, MD  Assessment/Plan: *Acute respiratory failure with hypoxia  - secondary to COPD exacerbation in pt with heavy smoking history  - No wheezing on my examination today. - will continue supportive care with nebulizers,  oxygen via [place]  - monitor vital signs as per floor protocol  -Change Solu-Medrol to by mouth prednisone - Will check 2d Echo.   Active Problems:  COPD (chronic obstructive pulmonary disease)  - in the setting of chronic tobacco abuse  - maintaining oxygen saturations > 95% on 2 L  -Currently on 30% Venturi mask which is equivalent to 2 1/2 liters nasal cannula - may need oxygen upon discharge--will need ambulatory pulse ox - continue nebs scheduled and prn, antibiotic and solumedrol as noted above -Change Levaquin to by mouth--tomorrow will be day #7 of abx, the final day  -Will need outpt PFTs  Tobacco abuse  - discussed cessation in detail  - nicotine patch   Hypokalemia  - secondary to beta agonist use  - supplemented-improved   Right lower lobe lung nodule -CT scan 12/21/2011 show slight enlargement -Will need PET scan as outpt  Consultants:  None Procedures/Studies:  Dg Chest 2 View  01/04/2012  Coarse lung markings most likely represent chronic changes but difficult to exclude mild edema.  There is no focal airspace disease.  Nodule in the right lower lung. This was better characterized on the chest CT from 12/21/2011.  Antibiotics:  Zithromax 11/29 --> 12/01  Levaquin 12/01 --> Code Status: Full  Family Communication: Pt at bedside  Disposition Plan: Home when medically stable          Procedures/Studies: Dg Chest 2 View  01/04/2012  *RADIOLOGY REPORT*  Clinical Data: Cough and shortness of breath.  CHEST - 2 VIEW  Comparison: 10/11/2011 and 12/21/2011  Findings: Two views of the chest were obtained.  There are coarse  lung markings bilaterally.  There is a 1.7 cm nodular density in the right lower lung that corresponds with the known pulmonary nodule.  Heart and mediastinum are within normal limits.  No evidence for pleural effusions.  Bony thorax is intact.  IMPRESSION: Coarse lung markings most likely represent chronic changes but difficult to exclude mild edema.  There is no focal airspace disease.  Nodule in the right lower lung.  This was better characterized on the chest CT from 12/21/2011.   Original Report Authenticated By: Richarda Overlie, M.D.    Ct Chest Wo Contrast  12/21/2011  *RADIOLOGY REPORT*  Clinical Data: Follow up of right lower lobe lung nodule, smoking history, history breast carcinoma in 2000 with lumpectomy  CT CHEST WITHOUT CONTRAST  Technique:  Multidetector CT imaging of the chest was performed following the standard protocol without IV contrast.  Comparison: CT of the chest of 05/30/2011  Findings: On the lung window images, the rounded nodule at the in the right lower lobe is relatively stable measuring 1.4 x 1.9 x 1.8 cm compared to 1.4 x 1.7 x 1.5 cm previously.  A calcified granuloma in the left lower lobe is stable as well.  There are multiple calcified hilar and mediastinal nodes present most consistent with prior granulomatous disease.  No new lung nodule is seen. The circumscribed nodule in the right lower lobe is larger than that a head CT may be help to assess for metabolic activity if indicated clinically.  No pleural effusion is noted.  On soft tissue window images, asymmetric  enlargement of the left lobe of thyroid is stable.  On this unenhanced study, no mediastinal or hilar adenopathy is seen, with calcified mediastinal and hilar nodes again noted consistent with prior granulomatous disease.  A calcified splenic granuloma also is present.  Surgical clips are noted from prior cholecystectomy.  There are calcified upper abdominal nodes present as well.  IMPRESSION:  1.  Stable well  circumscribed nodule within the right lower lobe. Consider PET CT for metabolic activity if indicated clinically. 2.  Diffuse changes of prior granulomatous disease with calcified left lower lobe granulomas, mediastinal and hilar calcified nodes, calcified splenic granuloma, and calcified nodes in the upper abdomen.   Original Report Authenticated By: Dwyane Dee, M.D.          Subjective: Patient is doing overall better. Denies any fevers, chills, chest pain, nausea, vomiting, diarrhea, abdominal pain. No hemoptysis.   Objective: Filed Vitals:   01/10/12 1139 01/10/12 1443 01/10/12 1612 01/10/12 1700  BP:  135/59  144/64  Pulse:  104  118  Temp:  98 F (36.7 C)  98.2 F (36.8 C)  TempSrc:  Oral  Oral  Resp:  22  20  Height:      Weight:      SpO2: 91% 90% 96% 92%    Intake/Output Summary (Last 24 hours) at 01/10/12 1755 Last data filed at 01/10/12 1430  Gross per 24 hour  Intake    480 ml  Output      0 ml  Net    480 ml   Weight change:  Exam:   General:  Pt is alert, follows commands appropriately, not in acute distress  HEENT: No icterus, No thrush,  /AT  Cardiovascular: RRR, S1/S2, no rubs, no gallops  Respiratory: Scattered rales bilaterally. Good air. No wheezing.  Abdomen: Soft/+BS, non tender, non distended, no guarding  Extremities: No edema, No lymphangitis, No petechiae, No rashes, no synovitis  Data Reviewed: Basic Metabolic Panel:  Lab 01/10/12 1610 01/09/12 0530 01/08/12 0528 01/07/12 0501 01/06/12 0542  NA 135 134* 135 137 137  K 4.1 4.4 4.2 4.4 2.9*  CL 96 94* 98 100 100  CO2 28 31 27 28 24   GLUCOSE 135* 145* 221* 182* 182*  BUN 21 22 17 13 15   CREATININE 0.44* 0.48* 0.41* 0.46* 0.48*  CALCIUM 9.2 9.5 9.7 9.6 9.2  MG -- -- -- -- --  PHOS -- -- -- -- --   Liver Function Tests: No results found for this basename: AST:5,ALT:5,ALKPHOS:5,BILITOT:5,PROT:5,ALBUMIN:5 in the last 168 hours No results found for this basename:  LIPASE:5,AMYLASE:5 in the last 168 hours No results found for this basename: AMMONIA:5 in the last 168 hours CBC:  Lab 01/09/12 0530 01/08/12 0528 01/07/12 0501 01/06/12 0542 01/05/12 0623 01/04/12 1805  WBC 10.9* 11.5* 13.2* 13.3* 12.0* --  NEUTROABS -- -- -- -- -- 8.0*  HGB 13.1 12.9 12.6 12.3 13.2 --  HCT 40.5 40.2 39.6 38.7 39.9 --  MCV 85.3 85.5 86.3 86.0 84.2 --  PLT 329 277 251 235 214 --   Cardiac Enzymes: No results found for this basename: CKTOTAL:5,CKMB:5,CKMBINDEX:5,TROPONINI:5 in the last 168 hours BNP: No components found with this basename: POCBNP:5 CBG: No results found for this basename: GLUCAP:5 in the last 168 hours  No results found for this or any previous visit (from the past 240 hour(s)).   Scheduled Meds:    . albuterol  2.5 mg Nebulization Q4H  . carbamazepine  200 mg Oral Daily  . carbamazepine  400 mg Oral QHS  . DULoxetine  90 mg Oral Daily  . enoxaparin (LOVENOX) injection  40 mg Subcutaneous Q24H  . fenofibrate  160 mg Oral Daily  . fluticasone  2 spray Each Nare Daily  . furosemide  20 mg Oral Daily  . ipratropium  0.5 mg Nebulization Q4H WA  . levofloxacin  750 mg Oral Daily  . nicotine  21 mg Transdermal Daily  . predniSONE  60 mg Oral BID WC  . pregabalin  75 mg Oral BID  . tiotropium  18 mcg Inhalation Daily  . verapamil  240 mg Oral QHS   Continuous Infusions:    Meredeth Ide, MD [facility] Pager [phone]  If 7PM-7AM, please contact night-coverage [email or web address] Password [email or web address] 01/10/2012, 5:55 PM   LOS: 6 days

## 2012-01-11 ENCOUNTER — Ambulatory Visit: Payer: Medicare Other | Admitting: Physical Therapy

## 2012-01-11 DIAGNOSIS — J449 Chronic obstructive pulmonary disease, unspecified: Secondary | ICD-10-CM

## 2012-01-11 DIAGNOSIS — I517 Cardiomegaly: Secondary | ICD-10-CM

## 2012-01-11 NOTE — Progress Notes (Signed)
Echocardiogram 2D Echocardiogram has been performed.  Breanna Dixon 01/11/2012, 10:11 AM

## 2012-01-11 NOTE — Progress Notes (Signed)
[facility] PROGRESS NOTE  Deronda A [place] AOZ:[id number] DOB: 1945/02/09 DOA: 01/04/2012 PCP: Mattie Marlin, MD  Assessment/Plan: *Acute respiratory failure with hypoxia  - secondary to COPD exacerbation in pt with heavy smoking history  - No wheezing on my examination today. - will continue supportive care with nebulizers,  oxygen via [place]  - monitor vital signs as per floor protocol  -Change Solu-Medrol to by mouth prednisone Echo shows grade 1 diastolic dysfunction   Active Problems:  COPD (chronic obstructive pulmonary disease)  - in the setting of chronic tobacco abuse  - maintaining oxygen saturations > 95% on 2 L  -Currently on 30% Venturi mask which is equivalent to 2 1/2 liters nasal cannula - may need oxygen upon discharge--will need ambulatory pulse ox - continue nebs scheduled and prn, antibiotic and solumedrol as noted above -Will need outpt PFTs Follow up pulmonary clinic on 12/10   Tobacco abuse  - discussed cessation in detail  - nicotine patch   Hypokalemia  - secondary to beta agonist use  - supplemented-improved   Right lower lobe lung nodule -CT scan 12/21/2011 show slight enlargement -Will need  Follow up as outpatient.  Consultants:  None Procedures/Studies:  Dg Chest 2 View  01/04/2012  Coarse lung markings most likely represent chronic changes but difficult to exclude mild edema.  There is no focal airspace disease.  Nodule in the right lower lung. This was better characterized on the chest CT from 12/21/2011.  Antibiotics:  Zithromax 11/29 --> 12/01  Levaquin 12/01 --> Code Status: Full  Family Communication: Pt at bedside  Disposition Plan: Home when medically stable          Procedures/Studies: Dg Chest 2 View  01/04/2012  *RADIOLOGY REPORT*  Clinical Data: Cough and shortness of breath.  CHEST - 2 VIEW  Comparison: 10/11/2011 and 12/21/2011  Findings: Two views of the chest were obtained.  There are coarse lung markings  bilaterally.  There is a 1.7 cm nodular density in the right lower lung that corresponds with the known pulmonary nodule.  Heart and mediastinum are within normal limits.  No evidence for pleural effusions.  Bony thorax is intact.  IMPRESSION: Coarse lung markings most likely represent chronic changes but difficult to exclude mild edema.  There is no focal airspace disease.  Nodule in the right lower lung.  This was better characterized on the chest CT from 12/21/2011.   Original Report Authenticated By: Richarda Overlie, M.D.    Ct Chest Wo Contrast  12/21/2011  *RADIOLOGY REPORT*  Clinical Data: Follow up of right lower lobe lung nodule, smoking history, history breast carcinoma in 2000 with lumpectomy  CT CHEST WITHOUT CONTRAST  Technique:  Multidetector CT imaging of the chest was performed following the standard protocol without IV contrast.  Comparison: CT of the chest of 05/30/2011  Findings: On the lung window images, the rounded nodule at the in the right lower lobe is relatively stable measuring 1.4 x 1.9 x 1.8 cm compared to 1.4 x 1.7 x 1.5 cm previously.  A calcified granuloma in the left lower lobe is stable as well.  There are multiple calcified hilar and mediastinal nodes present most consistent with prior granulomatous disease.  No new lung nodule is seen. The circumscribed nodule in the right lower lobe is larger than that a head CT may be help to assess for metabolic activity if indicated clinically.  No pleural effusion is noted.  On soft tissue window images, asymmetric enlargement of the left lobe of  thyroid is stable.  On this unenhanced study, no mediastinal or hilar adenopathy is seen, with calcified mediastinal and hilar nodes again noted consistent with prior granulomatous disease.  A calcified splenic granuloma also is present.  Surgical clips are noted from prior cholecystectomy.  There are calcified upper abdominal nodes present as well.  IMPRESSION:  1.  Stable well circumscribed nodule  within the right lower lobe. Consider PET CT for metabolic activity if indicated clinically. 2.  Diffuse changes of prior granulomatous disease with calcified left lower lobe granulomas, mediastinal and hilar calcified nodes, calcified splenic granuloma, and calcified nodes in the upper abdomen.   Original Report Authenticated By: Dwyane Dee, M.D.          Subjective: Patient is doing overall better. Denies any fevers, chills, chest pain, nausea, vomiting, diarrhea, abdominal pain. No hemoptysis.   Objective: Filed Vitals:   01/11/12 0600 01/11/12 0822 01/11/12 1420 01/11/12 1451  BP: 134/69  135/64   Pulse: 88  106   Temp: 98.1 F (36.7 C)  98.9 F (37.2 C)   TempSrc: Oral  Oral   Resp:   18   Height:      Weight:      SpO2: 93% 91% 93% 92%    Intake/Output Summary (Last 24 hours) at 01/11/12 1554 Last data filed at 01/10/12 1700  Gross per 24 hour  Intake    240 ml  Output      0 ml  Net    240 ml   Weight change:  Exam:   General:  Pt is alert, follows commands appropriately, not in acute distress  HEENT: No icterus, No thrush,  [place]/AT  Cardiovascular: RRR, S1/S2, no rubs, no gallops  Respiratory: Scattered rales bilaterally. Good air. No wheezing.  Abdomen: Soft/+BS, non tender, non distended, no guarding  Extremities: No edema, No lymphangitis, No petechiae, No rashes, no synovitis  Data Reviewed: Basic Metabolic Panel:  Lab 01/10/12 2841 01/09/12 0530 01/08/12 0528 01/07/12 0501 01/06/12 0542  NA 135 134* 135 137 137  K 4.1 4.4 4.2 4.4 2.9*  CL 96 94* 98 100 100  CO2 28 31 27 28 24   GLUCOSE 135* 145* 221* 182* 182*  BUN 21 22 17 13 15   CREATININE 0.44* 0.48* 0.41* 0.46* 0.48*  CALCIUM 9.2 9.5 9.7 9.6 9.2  MG -- -- -- -- --  PHOS -- -- -- -- --   Liver Function Tests: No results found for this basename: AST:5,ALT:5,ALKPHOS:5,BILITOT:5,PROT:5,ALBUMIN:5 in the last 168 hours No results found for this basename: LIPASE:5,AMYLASE:5 in the last 168  hours No results found for this basename: AMMONIA:5 in the last 168 hours CBC:  Lab 01/09/12 0530 01/08/12 0528 01/07/12 0501 01/06/12 0542 01/05/12 0623 01/04/12 1805  WBC 10.9* 11.5* 13.2* 13.3* 12.0* --  NEUTROABS -- -- -- -- -- 8.0*  HGB 13.1 12.9 12.6 12.3 13.2 --  HCT 40.5 40.2 39.6 38.7 39.9 --  MCV 85.3 85.5 86.3 86.0 84.2 --  PLT 329 277 251 235 214 --   Cardiac Enzymes: No results found for this basename: CKTOTAL:5,CKMB:5,CKMBINDEX:5,TROPONINI:5 in the last 168 hours BNP: No components found with this basename: POCBNP:5 CBG: No results found for this basename: GLUCAP:5 in the last 168 hours  No results found for this or any previous visit (from the past 240 hour(s)).   Scheduled Meds:    . albuterol  2.5 mg Nebulization Q4H  . carbamazepine  200 mg Oral Daily  . carbamazepine  400 mg Oral QHS  .  DULoxetine  90 mg Oral Daily  . enoxaparin (LOVENOX) injection  40 mg Subcutaneous Q24H  . fenofibrate  160 mg Oral Daily  . fluticasone  2 spray Each Nare Daily  . furosemide  20 mg Oral Daily  . ipratropium  0.5 mg Nebulization Q4H WA  . levofloxacin  750 mg Oral Daily  . nicotine  21 mg Transdermal Daily  . predniSONE  60 mg Oral Q breakfast  . pregabalin  75 mg Oral BID  . tiotropium  18 mcg Inhalation Daily  . verapamil  240 mg Oral QHS  . [DISCONTINUED] predniSONE  60 mg Oral BID WC   Continuous Infusions:    Meredeth Ide, MD [facility] Pager [phone]  If 7PM-7AM, please contact night-coverage [email or web address] Password [email or web address] 01/11/2012, 3:54 PM   LOS: 7 days

## 2012-01-11 NOTE — Progress Notes (Signed)
Pulse ox on exertion completed.  On room air pre ambulation 95% hr 120, during ambulation 93% hr 121, and post ambulation 95% hr 109.  Patient tolerated ambulation well.  Will monitor.  Dr. Sharl Ma aware of results.

## 2012-01-11 NOTE — Progress Notes (Deleted)
Pulse ox on exertion completed.  On room air pre ambulation 95% hr 105, during 94% hr 117, and post ambulation 95% hr 106.  Patient tolerated ambulation well.  Dr. Sharl Ma aware of results.  Will monitor.

## 2012-01-11 NOTE — Progress Notes (Signed)
Chaplain provided support with pt around illness and length of stay.    [place] years ago to be near grandchildren.  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  Prayed with chaplain.    Will continue to follow for support

## 2012-01-12 MED ORDER — PREDNISONE 20 MG PO TABS: ORAL_TABLET | ORAL | Status: DC

## 2012-01-12 MED ORDER — METOPROLOL SUCCINATE ER 100 MG PO TB24: 100.0000 mg | ORAL_TABLET | Freq: Every day | ORAL | Status: DC

## 2012-01-12 MED ORDER — FUROSEMIDE 20 MG PO TABS: 20.0000 mg | ORAL_TABLET | Freq: Every day | ORAL | Status: DC

## 2012-01-12 MED ORDER — TIOTROPIUM BROMIDE MONOHYDRATE 18 MCG IN CAPS: 18.0000 ug | ORAL_CAPSULE | Freq: Every day | RESPIRATORY_TRACT | Status: DC

## 2012-01-12 MED ORDER — METOPROLOL SUCCINATE ER 100 MG PO TB24
100.0000 mg | ORAL_TABLET | Freq: Every day | ORAL | Status: DC
  Administered 2012-01-12: 100 mg via ORAL
  Filled 2012-01-12: qty 1

## 2012-01-12 NOTE — Discharge Summary (Signed)
Physician Discharge Summary  Breanna Dixon UJW:[id number] DOB: 1945-12-03 DOA: 01/04/2012  PCP: Mattie Marlin, MD  Admit date: 01/04/2012 Discharge date: 01/12/2012  Time spent: 50 minutes  Recommendations for Outpatient Follow-up:  1. Followup pulmonary clinic on December 10 2. Followup with PCP in one week  Discharge Diagnoses:  Principal Problem:  *Acute respiratory failure with hypoxia Active Problems:  COPD (chronic obstructive pulmonary disease)  Tobacco abuse   Discharge Condition: Stable  Diet recommendation: Low salt diet  Filed Weights   01/04/12 2145  Weight: 85.594 kg (188 lb 11.2 oz)    History of present illness:  Breanna Dixon is a 66 y.o. female who presents to the ED with complaints of worsening SOB and wheezing And cough for the past 4 days. She began to have fevers today, the cough has been nonproductive. Her grandchildren who are visiting are sick with colds. She is a heavy smoker and has weaned herself down to no cigarettes this past week and is on a nicotine patch now. She previously smoked 1-2 packs daily.    Hospital Course:   Acute respiratory failure with hypoxia  Patient was started on IV steroids, oxygen, no visit treatments, antibiotics she completed 7 days of Levaquin in the hospital. The respiratory failure is thought to be second to COPD exacerbation. Patient also had a 2-D echo done in the hospital which showed grade 1 diastolic dysfunction. So patient was started on by mouth Lasix and has improved with diuresis. Patient will be discharged on prednisone taper.   Hypertension Patient Was taking Toprol X 200 mg by mouth daily along with verapamil 240 mg by mouth daily. In the hospital the Toprol XL was held and her blood pressure has remained stable though she was becoming tachycardic in the hospital. At this time I'm going to cut down the Toprol XL to 100 mg DOB along with verapamil 240 mg by mouth daily    COPD Patient has COPD, which  the oxygen saturation in the hospital and she does not require home O2 at this time. Patient will follow up with pulmonary as outpatient. An outpatient appointment has been made.  Right lower lobe lung nodule The CT scan shows slight enlargement Patient will follow up with pulmonary as outpatient for further workup  Procedures: None Consultations:  Pulmonary  Discharge Exam: Filed Vitals:   01/12/12 0210 01/12/12 0609 01/12/12 0825 01/12/12 1144  BP: 107/65 125/55  113/69  Pulse: 90 88  98  Temp: 97.4 F (36.3 C) 98 F (36.7 C)  98.6 F (37 C)  TempSrc: Oral Oral  Oral  Resp: 20 18  18   Height:      Weight:      SpO2: 93% 94% 95% 94%    General: Appearing no acute distress Cardiovascular: S1-S2 regular Respiratory: Clear to auscultation bilaterally Extremities: No edema  Discharge Instructions  Discharge Orders    Future Appointments: Provider: Department: Dept Phone: Center:   01/15/2012 9:00 AM Waymon Budge, MD [facility] [phone] None     Future Orders Please Complete By Expires   Diet - low sodium heart healthy      Increase activity slowly          Medication List     As of 01/12/2012  1:05 PM    TAKE these medications         albuterol-ipratropium 18-103 MCG/ACT inhaler   Commonly known as: COMBIVENT   Inhale 2 puffs into the lungs every 6 (six) hours  as needed. For shortness of breath      carbamazepine 200 MG 12 hr tablet   Commonly known as: TEGRETOL XR   Take 200-400 mg by mouth 2 (two) times daily. 200mg  in am and 400mg  at bedtime      DULoxetine 30 MG capsule   Commonly known as: CYMBALTA   Take 90 mg by mouth daily.      fenofibrate micronized 134 MG capsule   Commonly known as: LOFIBRA   Take 134 mg by mouth daily before breakfast.      fluticasone 50 MCG/ACT nasal spray   Commonly known as: FLONASE   Place 2 sprays into the nose daily.      furosemide 20 MG tablet   Commonly known as: LASIX   Take 1 tablet (20  mg total) by mouth daily.      HYDROcodone-acetaminophen 10-325 MG per tablet   Commonly known as: NORCO   Take 1 tablet by mouth every 4 (four) hours as needed.      LORazepam 0.5 MG tablet   Commonly known as: ATIVAN   Take 0.5 mg by mouth every 6 (six) hours as needed. For anxiety      metoprolol succinate 100 MG 24 hr tablet   Commonly known as: TOPROL-XL   Take 1 tablet (100 mg total) by mouth daily. Take with or immediately following a meal.      predniSONE 20 MG tablet   Commonly known as: DELTASONE   Prednisone 40 mg po  daily for 3 days then  prednisone 30 mg by mouth daily for 3 days then  Prednisone 20 mg  by mouth daily for 3 days then  Prednisone 10 mg by mouth daily for 3 days then stop      pregabalin 75 MG capsule   Commonly known as: LYRICA   Take 75 mg by mouth 2 (two) times daily.      promethazine 25 MG tablet   Commonly known as: PHENERGAN   Take 25 mg by mouth every 6 (six) hours as needed. For nausea.      tiotropium 18 MCG inhalation capsule   Commonly known as: SPIRIVA   Place 1 capsule (18 mcg total) into inhaler and inhale daily.      verapamil 240 MG (CO) 24 hr tablet   Commonly known as: COVERA HS   Take 240 mg by mouth at bedtime.           Follow-up Information    Follow up with [facility], MD. In 1 week.      Follow up with Waymon Budge, MD. On 01/15/2012. (at 9am)    Contact information:   [address] [facility] [place] [address] [phone]           The results of significant diagnostics from this hospitalization (including imaging, microbiology, ancillary and laboratory) are listed below for reference.    Significant Diagnostic Studies: Dg Chest 2 View  01/04/2012  *RADIOLOGY REPORT*  Clinical Data: Cough and shortness of breath.  CHEST - 2 VIEW  Comparison: 10/11/2011 and 12/21/2011  Findings: Two views of the chest were obtained.  There are coarse lung markings bilaterally.   There is a 1.7 cm nodular density in the right lower lung that corresponds with the known pulmonary nodule.  Heart and mediastinum are within normal limits.  No evidence for pleural effusions.  Bony thorax is intact.   IMPRESSION: Coarse lung markings most likely represent chronic changes  but difficult to exclude mild edema.  There is no focal airspace disease.  Nodule in the right lower lung.  This was better characterized on the chest CT from 12/21/2011.   Original Report Authenticated By: Richarda Overlie, M.D.    Ct Chest Wo Contrast  12/21/2011  *RADIOLOGY REPORT*  Clinical Data: Follow up of right lower lobe lung nodule, smoking history, history breast carcinoma in 2000 with lumpectomy  CT CHEST WITHOUT CONTRAST  Technique:  Multidetector CT imaging of the chest was performed following the standard protocol without IV contrast.  Comparison: CT of the chest of 05/30/2011  Findings: On the lung window images, the rounded nodule at the in the right lower lobe is relatively stable measuring 1.4 x 1.9 x 1.8 cm compared to 1.4 x 1.7 x 1.5 cm previously.  A calcified granuloma in the left lower lobe is stable as well.  There are multiple calcified hilar and mediastinal nodes present most consistent with prior granulomatous disease.  No new lung nodule is seen. The circumscribed nodule in the right lower lobe is larger than that a head CT may be help to assess for metabolic activity if indicated clinically.  No pleural effusion is noted.  On soft tissue window images, asymmetric enlargement of the left lobe of thyroid is stable.  On this unenhanced study, no mediastinal or hilar adenopathy is seen, with calcified mediastinal and hilar nodes again noted consistent with prior granulomatous disease.  A calcified splenic granuloma also is present.  Surgical clips are noted from prior cholecystectomy.  There are calcified upper abdominal nodes present as well.   IMPRESSION:  1.  Stable well circumscribed nodule within the  right lower lobe. Consider PET CT for metabolic activity if indicated clinically. 2.  Diffuse changes of prior granulomatous disease with calcified left lower lobe granulomas, mediastinal and hilar calcified nodes, calcified splenic granuloma, and calcified nodes in the upper abdomen.   Original Report Authenticated By: Dwyane Dee, M.D.     Microbiology: No results found for this or any previous visit (from the past 240 hour(s)).   Labs: Basic Metabolic Panel:  Lab 01/10/12 1610 01/09/12 0530 01/08/12 0528 01/07/12 0501 01/06/12 0542  NA 135 134* 135 137 137  K 4.1 4.4 4.2 4.4 2.9*  CL 96 94* 98 100 100  CO2 28 31 27 28 24   GLUCOSE 135* 145* 221* 182* 182*  BUN 21 22 17 13 15   CREATININE 0.44* 0.48* 0.41* 0.46* 0.48*  CALCIUM 9.2 9.5 9.7 9.6 9.2  MG -- -- -- -- --  PHOS -- -- -- -- --   Liver Function Tests: No results found for this basename: AST:5,ALT:5,ALKPHOS:5,BILITOT:5,PROT:5,ALBUMIN:5 in the last 168 hours No results found for this basename: LIPASE:5,AMYLASE:5 in the last 168 hours No results found for this basename: AMMONIA:5 in the last 168 hours CBC:  Lab 01/09/12 0530 01/08/12 0528 01/07/12 0501 01/06/12 0542  WBC 10.9* 11.5* 13.2* 13.3*  NEUTROABS -- -- -- --  HGB 13.1 12.9 12.6 12.3  HCT 40.5 40.2 39.6 38.7  MCV 85.3 85.5 86.3 86.0  PLT 329 277 251 235   Cardiac Enzymes: No results found for this basename: CKTOTAL:5,CKMB:5,CKMBINDEX:5,TROPONINI:5 in the last 168 hours BNP: BNP (last 3 results)  Basename 01/10/12 1642  PROBNP 69.5   CBG: No results found for this basename: GLUCAP:5 in the last 168 hours     Signed:  Princ[place] Nabor S  [facility] 01/12/2012, 1:05 PM

## 2012-01-12 NOTE — Progress Notes (Signed)
She receives, and signs for receipt of d/c information and prescriptions.  I take her to her son-in-law's vehicle without incident at this time per w/c.  She remains in no distress.

## 2012-01-15 ENCOUNTER — Ambulatory Visit (INDEPENDENT_AMBULATORY_CARE_PROVIDER_SITE_OTHER): Payer: Medicare Other | Admitting: Internal Medicine

## 2012-01-15 ENCOUNTER — Encounter: Payer: Self-pay | Admitting: Internal Medicine

## 2012-01-15 VITALS — BP 122/74 | HR 84 | Ht 67.5 in | Wt 192.8 lb

## 2012-01-15 DIAGNOSIS — Z9889 Other specified postprocedural states: Secondary | ICD-10-CM

## 2012-01-15 DIAGNOSIS — R918 Other nonspecific abnormal finding of lung field: Secondary | ICD-10-CM

## 2012-01-15 DIAGNOSIS — Z720 Tobacco use: Secondary | ICD-10-CM

## 2012-01-15 DIAGNOSIS — F17200 Nicotine dependence, unspecified, uncomplicated: Secondary | ICD-10-CM

## 2012-01-15 DIAGNOSIS — J449 Chronic obstructive pulmonary disease, unspecified: Secondary | ICD-10-CM

## 2012-01-15 DIAGNOSIS — R222 Localized swelling, mass and lump, trunk: Secondary | ICD-10-CM

## 2012-01-15 DIAGNOSIS — J9600 Acute respiratory failure, unspecified whether with hypoxia or hypercapnia: Secondary | ICD-10-CM

## 2012-01-15 DIAGNOSIS — J9601 Acute respiratory failure with hypoxia: Secondary | ICD-10-CM

## 2012-01-15 DIAGNOSIS — N63 Unspecified lump in unspecified breast: Secondary | ICD-10-CM

## 2012-01-15 DIAGNOSIS — J4489 Other specified chronic obstructive pulmonary disease: Secondary | ICD-10-CM

## 2012-01-15 DIAGNOSIS — N6320 Unspecified lump in the left breast, unspecified quadrant: Secondary | ICD-10-CM

## 2012-01-15 NOTE — Progress Notes (Signed)
01/15/12- 66 yoF heavy smoker recently hosp for exacerbation of COPD and now referred for pulmonary management. Hospitalized 11/29 through 01/12/2012 with discharge diagnoses of acute respiratory failure with hypoxia, COPD, tobacco abuse.. She had moved to this area from the [place] in 2009. Since here she has had more bronchitis with season change. Recent hospital for typical exacerbation. Now in physical therapy for rehabilitation after bilateral total knee replacements (05/15/2011, 10/14/2011). She has smoked one pack per day since 1967 and states that she stopped smoking November 28, using patches. Breathing is better now than usual from prehospital baseline. She never has had a cough. Activity has been limited by her knees but she thinks she has less dyspnea on exertion now. She is not needing her Combivent but continues Spiriva. A right lower lobe nodule was found April 2013. CT chest 12/21/2011 described as stable well-circumscribed right lower lobe nodule and recommended PET. Noted diffuse old granulomatous disease with calcification including calcified mediastinal and hilar nodes, granulomas in spleen and calcified nodes in the upper abdomen. No history of TB exposure. Pulmonary embolism 2006/IVC filter. Negative VQ scan 10/21/2011. She reports having a PET scan through [facility] in [place] this summer. We need to verify that and get that report.  Treated for hypertension but denies heart disease. 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 Adopted. CT 12/21/11-reviewed IMPRESSION:  1. Stable well circumscribed nodule within the right lower lobe.  Consider PET CT for metabolic activity if indicated clinically.  2. Diffuse changes of prior granulomatous disease with calcified  left lower lobe granulomas, mediastinal and hilar calcified nodes,  calcified splenic granuloma, and calcified nodes in the upper  abdomen.  Original Report Authenticated By: Dwyane Dee,  M.D.   Prior to Admission medications   Medication Sig Start Date End Date Taking? Authorizing Provider  albuterol-ipratropium (COMBIVENT) 18-103 MCG/ACT inhaler Inhale 2 puffs into the lungs every 6 (six) hours as needed. For shortness of breath   Yes Historical Provider, MD  carbamazepine (TEGRETOL XR) 200 MG 12 hr tablet 200mg  in am and 400mg  at bedtime   Yes Historical Provider, MD  DULoxetine (CYMBALTA) 30 MG capsule Take 90 mg by mouth daily.   Yes Historical Provider, MD  fenofibrate micronized (LOFIBRA) 134 MG capsule Take 134 mg by mouth daily before breakfast.   Yes Historical Provider, MD  fluticasone (FLONASE) 50 MCG/ACT nasal spray Place 2 sprays into the nose 2 (two) times daily.    Yes Historical Provider, MD  furosemide (LASIX) 20 MG tablet Take 1 tablet (20 mg total) by mouth daily. 01/12/12  Yes Meredeth Ide, MD  HYDROcodone-acetaminophen (NORCO) 10-325 MG per tablet Take 1 tablet by mouth every 4 (four) hours as needed.   Yes Historical Provider, MD  LORazepam (ATIVAN) 0.5 MG tablet Take 0.5 mg by mouth every 6 (six) hours as needed. For anxiety   Yes Historical Provider, MD  metoprolol succinate (TOPROL-XL) 100 MG 24 hr tablet Take 1 tablet (100 mg total) by mouth daily. Take with or immediately following a meal. 01/12/12  Yes Meredeth Ide, MD  predniSONE (DELTASONE) 20 MG tablet Prednisone 40 mg po  daily for 3 days then prednisone 30 mg by mouth daily for 3 days then Prednisone 20 mg  by mouth daily for 3 days then Prednisone 10 mg by mouth daily for 3 days then stop 01/12/12  Yes Meredeth Ide, MD  pregabalin (LYRICA) 75 MG capsule Take 75 mg by mouth 2 (two)  times daily.   Yes Historical Provider, MD  promethazine (PHENERGAN) 25 MG tablet Take 25 mg by mouth every 6 (six) hours as needed. For nausea.   Yes Historical Provider, MD  tiotropium (SPIRIVA) 18 MCG inhalation capsule Place 1 capsule (18 mcg total) into inhaler and inhale daily. 01/12/12  Yes Meredeth Ide, MD   verapamil (COVERA HS) 240 MG (CO) 24 hr tablet Take 240 mg by mouth at bedtime.   Yes Historical Provider, MD   Past Medical History  Diagnosis Date  . Complication of anesthesia     woke up during previous surgery  . Cancer     breast hx  . Dysrhythmia     tachycardia  . Hypertension     take verapamil and toprol daily  . Bronchitis   . Seasonal allergies     flonase prn  . Pulmonary embolism 2006    hx of   . Blood transfusion 1974  . Ulcer, gastric, acute     hx of  . Fibromyalgia   . Mental disorder     hx of ptsd   . Lung mass April 2013    Right lower lobe lung mass  . Pneumonia     hx of.  5 x in life time.  [name] Head injury, closed, with concussion   . Kidney stone     hx of  . Headache     migranes, last one was in Jun 12, 2011  . Arthritis   . Hypertension    Past Surgical History  Procedure Date  . Cholecystectomy 1994  . Back surgery 2000/2010    fusion  . Tonsillectomy 1967  . Appendectomy 1965  . Cystoscopy     multiple  . Urethral dilation 1971  . Breast surgery     bilateral partial masectomy  . Abdominal hysterectomy 1992  . Dilation and curettage of uterus   . Hernia repair   . Colonoscopy   . Knee arthroscopy   . Total knee arthroplasty 06/04/2011    Procedure: TOTAL KNEE ARTHROPLASTY;  Surgeon: Raymon Mutton, MD;  Location: [facility] OR;  Service: Orthopedics;  Laterality: Right;  . Vena cava filter placement 2006  . Incisional hernia repair   . Multiple tooth extractions   . Joint replacement   . Total knee arthroplasty 10/15/2011    Procedure: TOTAL KNEE ARTHROPLASTY;  Surgeon: Raymon Mutton, MD;  Location: [facility] OR;  Service: Orthopedics;  Laterality: Left;  . Ivc filter    Family History  Problem Relation Age of Onset  . Adopted: Yes  . Anesthesia problems Neg Hx   . Hypotension Neg Hx   . Malignant hyperthermia Neg Hx   . Pseudochol deficiency Neg Hx    History   Social History  . Marital Status: Divorced    Spouse Name: N/A     Number of Children: N/A  . Years of Education: N/A   Occupational History  . Not on file.   Social History Main Topics  . Smoking status: Former Smoker -- 0.7 packs/day for 7 years    Types: Cigarettes    Quit date: 01/03/2012  . Smokeless tobacco: Never Used  . Alcohol Use: No  . Drug Use: No  . Sexually Active: No   Other Topics Concern  . Not on file   Social History Narrative  . No narrative on file   ROS-see HPI Constitutional:   No-   weight loss, night sweats, fevers, chills, fatigue, lassitude. HEENT:  No-  headaches, difficulty swallowing, tooth/dental problems, sore throat,       No-  sneezing, itching, ear ache, nasal congestion, post nasal drip,  CV:  No-   chest pain, orthopnea, PND, swelling in lower extremities, anasarca,  dizziness, palpitations Resp: +shortness of breath with exertion or at rest.              No-   productive cough,  No non-productive cough,  No- coughing up of blood.              No-   change in color of mucus.  No- wheezing.   Skin: No-   rash or lesions. GI:  No-   heartburn, indigestion, abdominal pain, nausea, vomiting, diarrhea,                 change in bowel habits, loss of appetite GU: No-   dysuria, change in color of urine, no urgency or frequency.  No- flank pain. MS:  No-   joint pain or swelling.  No- decreased range of motion.  No- back pain. Neuro-     nothing unusual Psych:  No- change in mood or affect. No depression or anxiety.  No memory loss.  OBJ- Physical Exam General- Alert, Oriented, Affect-appropriate, Distress- none acute Skin- rash-none, lesions- none, excoriation- none Lymphadenopathy- none Head- atraumatic            Eyes- Gross vision intact, PERRLA, conjunctivae and secretions clear            Ears- Hearing, canals-normal            Nose- Clear, no-Septal dev, mucus, polyps, erosion, perforation             Throat- Mallampati II , mucosa clear , drainage- none, tonsils- atrophic. Dentures Neck- flexible ,  trachea midline, no stridor , thyroid nl, carotid no bruit Chest - symmetrical excursion , unlabored           Heart/CV- RRR , no murmur , no gallop  , no rub, nl s1 s2                           - JVD- none , edema- none, stasis changes- none, varices- none           Lung- clear to P&A, wheeze- none, cough- none , dullness-none, rub- none           Chest wall- will be area that she demonstrates left upper outer quadrant breast with no discharge or tenderness. Abd- tender-no, distended-no, bowel sounds-present, HSM- no Br/ Gen/ Rectal- Not done, not indicated Extrem- cyanosis- none, clubbing, none, atrophy- none, strength- nl Neuro- grossly intact to observation

## 2012-01-15 NOTE — Patient Instructions (Addendum)
Order- mamograms - hx bilateral lumpectomy/ Ca  Order- schedule PFT    Dx COPD  Release of records so we can get reports of PET scan from Dr Claudie Fisherman at [facility], or [place], also any prior CT chest or CXR reports from past 5 years.  Finish the prednisone taper and continue the Spiriva.   Please call as needed

## 2012-01-23 ENCOUNTER — Ambulatory Visit
Admission: RE | Admit: 2012-01-23 | Discharge: 2012-01-23 | Disposition: A | Discharge status: Home / Self Care | Payer: Medicare Other | Source: Ambulatory Visit | Attending: Internal Medicine | Admitting: Internal Medicine

## 2012-01-23 ENCOUNTER — Encounter: Payer: Self-pay | Admitting: Internal Medicine

## 2012-01-23 DIAGNOSIS — N6320 Unspecified lump in the left breast, unspecified quadrant: Secondary | ICD-10-CM

## 2012-01-23 DIAGNOSIS — Z9889 Other specified postprocedural states: Secondary | ICD-10-CM

## 2012-01-23 NOTE — Assessment & Plan Note (Signed)
She says that she had a PET scan at [facility] this year. She is signing release so we can get that report and disk.

## 2012-01-23 NOTE — Assessment & Plan Note (Signed)
Plan-schedule PFT. Continue Spiriva. Finish current prednisone taper and Flonase

## 2012-01-23 NOTE — Assessment & Plan Note (Signed)
Now back to baseline pulmonary function. Plan-scheduled, and function tests. Records from previous pulmonologist in [place].

## 2012-01-23 NOTE — Assessment & Plan Note (Signed)
She asks evaluation of a ropey area  she has found.  Plan-mammograms

## 2012-01-23 NOTE — Assessment & Plan Note (Signed)
Strongly encouraged to stay off the cigarettes completely.

## 2012-02-05 ENCOUNTER — Telehealth: Payer: Self-pay | Admitting: Internal Medicine

## 2012-02-05 NOTE — Telephone Encounter (Signed)
Pt is aware to contact her PCP.

## 2012-02-05 NOTE — Telephone Encounter (Signed)
Per CY-Pt needs to call or see her PCP for these symptoms.

## 2012-02-05 NOTE — Telephone Encounter (Signed)
Spoke with pt She is c/o numbness in hands and feet since last night She states "this is not a bid deal b/c I have fibromyalgia" She states that her BP is elevated this am- 144/104 She states that her lips feel numb, but denies cyanosis She is requesting to be worked in with [facility] this am Please advise, thanks! Last ov 01/15/12 Next ov 02/26/12 Allergies  Allergen Reactions  . Imitrex (Sumatriptan Base) Nausea And Vomiting    Rapid heart beat.  [name] Ketorolac Tromethamine Nausea And Vomiting    Rapid heart beat.  . Stadol (Butorphanol Tartrate) Nausea And Vomiting  . Ivp Dye (Iodinated Diagnostic Agents) Nausea Only and Palpitations    Can tolerate iodine  . Shellfish Allergy Nausea Only and Palpitations

## 2012-02-20 NOTE — Progress Notes (Signed)
Quick Note:  Pt aware of results and sent to PCP office. ______

## 2012-02-26 ENCOUNTER — Ambulatory Visit: Payer: Medicare Other | Admitting: Internal Medicine

## 2012-03-31 ENCOUNTER — Ambulatory Visit: Payer: Medicare Other | Admitting: Internal Medicine

## 2012-04-09 ENCOUNTER — Ambulatory Visit: Payer: Medicare Other | Admitting: Internal Medicine

## 2012-04-26 ENCOUNTER — Emergency Department (HOSPITAL_COMMUNITY)
Admission: EM | Admit: 2012-04-26 | Discharge: 2012-04-26 | Disposition: A | Discharge status: Home / Self Care | Payer: Medicare Other | Attending: Emergency Medicine | Admitting: Emergency Medicine

## 2012-04-26 ENCOUNTER — Emergency Department (HOSPITAL_COMMUNITY): Payer: Medicare Other

## 2012-04-26 DIAGNOSIS — W010XXA Fall on same level from slipping, tripping and stumbling without subsequent striking against object, initial encounter: Secondary | ICD-10-CM | POA: Insufficient documentation

## 2012-04-26 DIAGNOSIS — Z79899 Other long term (current) drug therapy: Secondary | ICD-10-CM | POA: Insufficient documentation

## 2012-04-26 DIAGNOSIS — Z87891 Personal history of nicotine dependence: Secondary | ICD-10-CM | POA: Insufficient documentation

## 2012-04-26 DIAGNOSIS — Z8781 Personal history of (healed) traumatic fracture: Secondary | ICD-10-CM | POA: Insufficient documentation

## 2012-04-26 DIAGNOSIS — S8990XA Unspecified injury of unspecified lower leg, initial encounter: Secondary | ICD-10-CM | POA: Insufficient documentation

## 2012-04-26 DIAGNOSIS — Z8709 Personal history of other diseases of the respiratory system: Secondary | ICD-10-CM | POA: Insufficient documentation

## 2012-04-26 DIAGNOSIS — Z87442 Personal history of urinary calculi: Secondary | ICD-10-CM | POA: Insufficient documentation

## 2012-04-26 DIAGNOSIS — Y929 Unspecified place or not applicable: Secondary | ICD-10-CM | POA: Insufficient documentation

## 2012-04-26 DIAGNOSIS — IMO0002 Reserved for concepts with insufficient information to code with codable children: Secondary | ICD-10-CM | POA: Insufficient documentation

## 2012-04-26 DIAGNOSIS — Z8701 Personal history of pneumonia (recurrent): Secondary | ICD-10-CM | POA: Insufficient documentation

## 2012-04-26 DIAGNOSIS — S61219A Laceration without foreign body of unspecified finger without damage to nail, initial encounter: Secondary | ICD-10-CM

## 2012-04-26 DIAGNOSIS — Y939 Activity, unspecified: Secondary | ICD-10-CM | POA: Insufficient documentation

## 2012-04-26 DIAGNOSIS — W19XXXA Unspecified fall, initial encounter: Secondary | ICD-10-CM

## 2012-04-26 DIAGNOSIS — Z8719 Personal history of other diseases of the digestive system: Secondary | ICD-10-CM | POA: Insufficient documentation

## 2012-04-26 DIAGNOSIS — Z853 Personal history of malignant neoplasm of breast: Secondary | ICD-10-CM | POA: Insufficient documentation

## 2012-04-26 DIAGNOSIS — M25561 Pain in right knee: Secondary | ICD-10-CM

## 2012-04-26 DIAGNOSIS — Z86711 Personal history of pulmonary embolism: Secondary | ICD-10-CM | POA: Insufficient documentation

## 2012-04-26 DIAGNOSIS — S61209A Unspecified open wound of unspecified finger without damage to nail, initial encounter: Secondary | ICD-10-CM | POA: Insufficient documentation

## 2012-04-26 DIAGNOSIS — I10 Essential (primary) hypertension: Secondary | ICD-10-CM | POA: Insufficient documentation

## 2012-04-26 DIAGNOSIS — S99919A Unspecified injury of unspecified ankle, initial encounter: Secondary | ICD-10-CM | POA: Insufficient documentation

## 2012-04-26 DIAGNOSIS — Z8739 Personal history of other diseases of the musculoskeletal system and connective tissue: Secondary | ICD-10-CM | POA: Insufficient documentation

## 2012-04-26 MED ORDER — TETANUS-DIPHTH-ACELL PERTUSSIS 5-2.5-18.5 LF-MCG/0.5 IM SUSP
0.5000 mL | Freq: Once | INTRAMUSCULAR | Status: AC
  Administered 2012-04-26: 0.5 mL via INTRAMUSCULAR
  Filled 2012-04-26: qty 0.5

## 2012-04-26 MED ORDER — HYDROCODONE-ACETAMINOPHEN 5-325 MG PO TABS: 1.0000 | ORAL_TABLET | Freq: Four times a day (QID) | ORAL | Status: DC | PRN

## 2012-04-26 MED ORDER — OXYCODONE-ACETAMINOPHEN 5-325 MG PO TABS
1.0000 | ORAL_TABLET | Freq: Once | ORAL | Status: AC
  Administered 2012-04-26: 1 via ORAL
  Filled 2012-04-26: qty 1

## 2012-04-26 NOTE — ED Notes (Signed)
Pt states she tripped and fell in the kitchen and cut her R thumb on the lid from a tin can. Bleeding controlled with pressure dressing. Pt also states that she recently had a total knee replacement and landed on her R knee. Pt ambulatory to exam room with steady gait using a cane. Pt states she does not know when she last had a tetanus shot. States knee is painful and requesting X-ray of R knee.

## 2012-04-26 NOTE — ED Provider Notes (Signed)
History    This chart was scribed for non-physician practitioner working with Breanna Shi, MD by Leone Payor, ED Scribe. This patient was seen in room WTR8/WTR8 and the patient's care was started at 1811.   CSN: [id number]  Arrival date & time 04/26/12  1811   First MD Initiated Contact with Patient 04/26/12 1827      No chief complaint on file.    The history is provided by the patient. No language interpreter was used.    Breanna Dixon is a 67 y.o. female who presents to the Emergency Department complaining of a laceration to her right thumb that occurred about 2 hours ago. States she tripped and fell in her kitchen onto the lid of a tin can. The bleeding is controlled with pressure dressing. Pt also states she recently had a total knee replacement and landed on her right knee. Does not recall last tetanus shot. States she has pain to the right knee and requests an X-ray.    Pt is a former smoker but denies alcohol use.  Past Medical History  Diagnosis Date  . Complication of anesthesia     woke up during previous surgery  . Cancer     breast hx  . Dysrhythmia     tachycardia  . Hypertension     take verapamil and toprol daily  . Bronchitis   . Seasonal allergies     flonase prn  . Pulmonary embolism 2006    hx of   . Blood transfusion 1974  . Ulcer, gastric, acute     hx of  . Fibromyalgia   . Mental disorder     hx of ptsd   . Lung mass April 2013    Right lower lobe lung mass  . Pneumonia     hx of.  5 x in life time.  [name] Head injury, closed, with concussion   . Kidney stone     hx of  . Headache     migranes, last one was in Jun 12, 2011  . Arthritis   . Hypertension     Past Surgical History  Procedure Laterality Date  . Cholecystectomy  1994  . Back surgery  2000/2010    fusion  . Tonsillectomy  1967  . Appendectomy  1965  . Cystoscopy      multiple  . Urethral dilation  1971  . Breast surgery      bilateral partial masectomy  . Abdominal  hysterectomy  1992  . Dilation and curettage of uterus    . Hernia repair    . Colonoscopy    . Knee arthroscopy    . Total knee arthroplasty  06/04/2011    Procedure: TOTAL KNEE ARTHROPLASTY;  Surgeon: Raymon Mutton, MD;  Location: [facility] OR;  Service: Orthopedics;  Laterality: Right;  . Vena cava filter placement  2006  . Incisional hernia repair    . Multiple tooth extractions    . Joint replacement    . Total knee arthroplasty  10/15/2011    Procedure: TOTAL KNEE ARTHROPLASTY;  Surgeon: Raymon Mutton, MD;  Location: [facility] OR;  Service: Orthopedics;  Laterality: Left;  . Ivc filter      Family History  Problem Relation Age of Onset  . Adopted: Yes  . Anesthesia problems Neg Hx   . Hypotension Neg Hx   . Malignant hyperthermia Neg Hx   . Pseudochol deficiency Neg Hx     History  Substance Use Topics  .  Smoking status: Former Smoker -- 0.75 packs/day for 7 years    Types: Cigarettes    Quit date: 01/03/2012  . Smokeless tobacco: Never Used  . Alcohol Use: No    No OB history provided.   Review of Systems  Skin: Positive for wound.    Allergies  Imitrex; Ketorolac tromethamine; Stadol; Ivp dye; and Shellfish allergy  Home Medications   Current Outpatient Rx  Name  Route  Sig  Dispense  Refill  . albuterol-ipratropium (COMBIVENT) 18-103 MCG/ACT inhaler   Inhalation   Inhale 2 puffs into the lungs every 6 (six) hours as needed. For shortness of breath         . carbamazepine (TEGRETOL XR) 200 MG 12 hr tablet      200mg  in am and 400mg  at bedtime         . DULoxetine (CYMBALTA) 30 MG capsule   Oral   Take 90 mg by mouth daily.         . fenofibrate micronized (LOFIBRA) 134 MG capsule   Oral   Take 134 mg by mouth daily before breakfast.         . fluticasone (FLONASE) 50 MCG/ACT nasal spray   Nasal   Place 2 sprays into the nose 2 (two) times daily.          . furosemide (LASIX) 20 MG tablet   Oral   Take 1 tablet (20 mg total) by mouth daily.    30 tablet   2   . HYDROcodone-acetaminophen (NORCO) 10-325 MG per tablet   Oral   Take 1 tablet by mouth every 4 (four) hours as needed.         [name] LORazepam (ATIVAN) 0.5 MG tablet   Oral   Take 0.5 mg by mouth every 6 (six) hours as needed. For anxiety         . metoprolol succinate (TOPROL-XL) 100 MG 24 hr tablet   Oral   Take 1 tablet (100 mg total) by mouth daily. Take with or immediately following a meal.   30 tablet   1   . predniSONE (DELTASONE) 20 MG tablet      Prednisone 40 mg po  daily for 3 days then prednisone 30 mg by mouth daily for 3 days then Prednisone 20 mg  by mouth daily for 3 days then Prednisone 10 mg by mouth daily for 3 days then stop   30 tablet   0   . pregabalin (LYRICA) 75 MG capsule   Oral   Take 75 mg by mouth 2 (two) times daily.         . promethazine (PHENERGAN) 25 MG tablet   Oral   Take 25 mg by mouth every 6 (six) hours as needed. For nausea.         [name] tiotropium (SPIRIVA) 18 MCG inhalation capsule   Inhalation   Place 1 capsule (18 mcg total) into inhaler and inhale daily.   30 capsule   2   . verapamil (COVERA HS) 240 MG (CO) 24 hr tablet   Oral   Take 240 mg by mouth at bedtime.           BP 156/62  Pulse 88  Temp(Src) 98.9 F (37.2 C) (Oral)  Resp 18  SpO2 96%  LMP 06/04/2011  Physical Exam  Nursing note and vitals reviewed. Constitutional: She is oriented to person, place, and time. She appears well-developed and well-nourished. No distress.  HENT:  Head: Normocephalic and  atraumatic.  Eyes: EOM are normal.  Neck: Neck supple. No tracheal deviation present.  Cardiovascular: Normal rate.   Pulmonary/Chest: Effort normal. No respiratory distress.  Musculoskeletal: Normal range of motion.  Tender in lateral right knee. Full ROM in right knee. Drawers are stable. Normal DP pulses.    Has 3 cm flap laceration to the palmer surface of IP joint of right thumb. Good flexion strength against resistance and  extension. Cap refill <2 seconds. Laceration is actively bleeding    Neurological: She is alert and oriented to person, place, and time.  Skin: Skin is warm and dry.  Psychiatric: She has a normal mood and affect. Her behavior is normal.    ED Course  Procedures (including critical care time)  DIAGNOSTIC STUDIES: Oxygen Saturation is 96% on room air, adequate by my interpretation.    COORDINATION OF CARE: 7:57 PM Discussed treatment plan with pt at bedside and pt agreed to plan.    8:12 PM Five small stiches with 3 large stitches on top.   Labs Reviewed - No data to display Dg Knee Complete 4 Views Right  04/26/2012  *RADIOLOGY REPORT*  Clinical Data: Fall.  Knee pain  RIGHT KNEE - COMPLETE 4+ VIEW  Comparison: None  Findings: Total knee replacement in satisfactory position alignment.  No acute fracture or dislocation.  IMPRESSION: Satisfactory right hip replacement.   Original Report Authenticated By: Janeece Riggers, M.D.    Dg Finger Thumb Right  04/26/2012  *RADIOLOGY REPORT*  Clinical Data: Laceration  RIGHT THUMB 2+V  Comparison: None  Findings: Negative for fracture or foreign body.  Skin laceration and bandage noted.  IMPRESSION: Negative for fracture or foreign body.   Original Report Authenticated By: Janeece Riggers, M.D.    LACERATION REPAIR Performed by: Lottie Mussel Authorized by: Jaynie Crumble A Consent: Verbal consent obtained. Risks and benefits: risks, benefits and alternatives were discussed Consent given by: patient Patient identity confirmed: provided demographic data Prepped and Draped in normal sterile fashion Wound explored  Laceration Location: right thumb  Laceration Length: 3cm  No Foreign Bodies seen or palpated  Anesthesia: local infiltration  Local anesthetic: lidocaine 2% woepinephrine  Anesthetic total: 3 ml  Irrigation method: syringe Amount of cleaning: standard  Skin closure: prolene 6.0, prolene 4.0  Number of sutures:  7  Technique: simple interrupted. Closed skin with 6.0, and had to place 3 4.0 deeper stitches to control bleeding.   Patient tolerance: Patient tolerated the procedure well with no immediate complications.    1. Finger laceration, initial encounter   2. Knee pain, right   3. Fall, initial encounter       MDM  Pt with laceration to the right thumb. Still bleeding on presentation. Bleeding controlled with sutures. She is not on blood thinners. Knee x-ray negative. Will treat with pain medications, norco at home, follow up with pcp and orthopedist.     I personally performed the services described in this documentation, which was scribed in my presence. The recorded information has been reviewed and is accurate.   Lottie Mussel, PA-C 04/27/12 0131

## 2012-04-28 NOTE — ED Provider Notes (Signed)
Medical screening examination/treatment/procedure(s) were performed by non-physician practitioner and as supervising physician I was immediately available for consultation/collaboration.   [name], MD 04/28/12 [id number]

## 2012-05-16 ENCOUNTER — Ambulatory Visit (INDEPENDENT_AMBULATORY_CARE_PROVIDER_SITE_OTHER): Payer: Medicare Other | Admitting: Internal Medicine

## 2012-05-16 ENCOUNTER — Encounter: Payer: Self-pay | Admitting: Internal Medicine

## 2012-05-16 ENCOUNTER — Other Ambulatory Visit: Payer: Medicare Other

## 2012-05-16 VITALS — BP 138/74 | HR 79 | Ht 66.0 in | Wt 208.0 lb

## 2012-05-16 DIAGNOSIS — J8410 Pulmonary fibrosis, unspecified: Secondary | ICD-10-CM

## 2012-05-16 DIAGNOSIS — N63 Unspecified lump in unspecified breast: Secondary | ICD-10-CM

## 2012-05-16 DIAGNOSIS — R222 Localized swelling, mass and lump, trunk: Secondary | ICD-10-CM

## 2012-05-16 DIAGNOSIS — N6320 Unspecified lump in the left breast, unspecified quadrant: Secondary | ICD-10-CM

## 2012-05-16 DIAGNOSIS — J449 Chronic obstructive pulmonary disease, unspecified: Secondary | ICD-10-CM

## 2012-05-16 DIAGNOSIS — J984 Other disorders of lung: Secondary | ICD-10-CM

## 2012-05-16 DIAGNOSIS — R918 Other nonspecific abnormal finding of lung field: Secondary | ICD-10-CM

## 2012-05-16 LAB — PULMONARY FUNCTION TEST

## 2012-05-16 NOTE — Progress Notes (Signed)
PFT done today. 

## 2012-05-16 NOTE — Progress Notes (Signed)
01/15/12- 66 yoF heavy smoker recently hosp for exacerbation of COPD and now referred for pulmonary management. Hospitalized 11/29 through 01/12/2012 with discharge diagnoses of acute respiratory failure with hypoxia, COPD, tobacco abuse.. [place] had moved to this area from the [place] in 2009. Since here she has had more bronchitis with season change. Recent hospital for typical exacerbation. Now in physical therapy for rehabilitation after bilateral total knee replacements (05/15/2011, 10/14/2011). She has smoked one pack per day since 1967 and states that she stopped smoking November 28, using patches. Breathing is better now than usual from prehospital baseline. She never has had a cough. Activity has been limited by her knees but she thinks she has less dyspnea on exertion now. She is not needing her Combivent but continues Spiriva. A right lower lobe nodule was found April 2013. CT chest 12/21/2011 described as stable well-circumscribed right lower lobe nodule and recommended PET. Noted diffuse old granulomatous disease with calcification including calcified mediastinal and hilar nodes, granulomas in spleen and calcified nodes in the upper abdomen. No history of TB exposure. Pulmonary embolism 2006/IVC filter. Negative VQ scan 10/21/2011. She reports having a PET scan through [facility] in [place] this summer. We need to verify that and get that report.  Treated for hypertension but denies heart disease. 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 Adopted. CT 12/21/11-reviewed IMPRESSION:  1. Stable well circumscribed nodule within the right lower lobe.  Consider PET CT for metabolic activity if indicated clinically.  2. Diffuse changes of prior granulomatous disease with calcified  left lower lobe granulomas, mediastinal and hilar calcified nodes,  calcified splenic granuloma, and calcified nodes in the upper  abdomen.  Original Report Authenticated By: Dwyane Dee,  M.D.   05/16/12- 50 yoF former heavy smoker followed for  COPD, lung nodule RLL  FOLLOWS FOR: pt states breathing is doing good overall(used Rescue inhaler once since hospital stay); review PFT and mammogram results with patient. States she quit smoking cigarettes in November, 2013. Had CT chest 12/21/2011 and PET scan at [facility],  her pulmonologist at [facility] retired. Now denies shortness of breath, cough, phlegm wheeze. Using Spiriva daily. History of bilateral lumpectomy. Mammograms were negative 01/23/2012.  CXR 01/04/12 IMPRESSION:  Coarse lung markings most likely represent chronic changes but  difficult to exclude mild edema. There is no focal airspace  disease.  Nodule in the right lower lung. This was better characterized on  the chest CT from 12/21/2011.  Original Report Authenticated By: Richarda Overlie, M.D. PFT 05/16/2012: Mild obstructive airways disease with insignificant response to bronchodilator, normal lung volumes, diffusion mildly reduced. FVC 3.08/96%, FEV1 2.01/86%, FEV1/FEC 0.65. TLC 95%, DLCO 73%.   ROS-see HPI Constitutional:   No-   weight loss, night sweats, fevers, chills, fatigue, lassitude. HEENT:   No-  headaches, difficulty swallowing, tooth/dental problems, sore throat,       No-  sneezing, itching, ear ache, nasal congestion, post nasal drip,  CV:  No-   chest pain, orthopnea, PND, swelling in lower extremities, anasarca,  dizziness, palpitations Resp: no-shortness of breath with exertion or at rest.              No-   productive cough,  No non-productive cough,  No- coughing up of blood.              No-   change in color of mucus.  No- wheezing.   Skin: No-   rash or lesions. GI:  No-   heartburn,  indigestion, abdominal pain, nausea, vomiting,  GU: . MS:  No-   joint pain or swelling. Neuro-     nothing unusual Psych:  No- change in mood or affect. No depression or anxiety.  No memory loss.  OBJ- Physical Exam General- Alert, Oriented,  Affect-appropriate, Distress- none acute Skin- rash-none, lesions- none, excoriation- none Lymphadenopathy- none Head- atraumatic            Eyes- Gross vision intact, PERRLA, conjunctivae and secretions clear            Ears- Hearing, canals-normal            Nose- Clear, no-Septal dev, mucus, polyps, erosion, perforation             Throat- Mallampati II , mucosa clear , drainage- none, tonsils- atrophic. Dentures Neck- flexible , trachea midline, no stridor , thyroid nl, carotid no bruit Chest - symmetrical excursion , unlabored           Heart/CV- RRR , no murmur , no gallop  , no rub, nl s1 s2                           - JVD- none , edema- none, stasis changes- none, varices- none           Lung- clear to P&A, wheeze- none, cough- none , dullness-none, rub- none           Chest wall-  Abd-  Br/ Gen/ Rectal- Not done, not indicated Extrem- cyanosis- none, clubbing, none, atrophy- none, strength- nl Neuro- grossly intact to observation

## 2012-05-16 NOTE — Patient Instructions (Addendum)
We will contact [facility] for the report and disk of your PET scan from April 2013  When I have that report I will talk with you about follow-up CT and PET scans as appropriate.   Order- lab Quant TB assay   Dx hx of granulomatous lung disease

## 2012-05-19 LAB — QUANTIFERON TB GOLD ASSAY (BLOOD)
Mitogen value: 7.33 IU/mL
Quantiferon Tb Ag Minus Nil Value: 0 IU/mL

## 2012-05-20 NOTE — Progress Notes (Signed)
Quick Note:  LMOM TCB x1. ______ 

## 2012-05-22 ENCOUNTER — Other Ambulatory Visit: Payer: Self-pay | Admitting: *Deleted

## 2012-05-22 DIAGNOSIS — R911 Solitary pulmonary nodule: Secondary | ICD-10-CM

## 2012-05-25 DIAGNOSIS — J8410 Pulmonary fibrosis, unspecified: Secondary | ICD-10-CM | POA: Insufficient documentation

## 2012-05-25 NOTE — Assessment & Plan Note (Addendum)
Probably stable. We will request the results of PET scan from [facility] for followup. Not anticipate having further pulmonology followup at [facility] since her Dr. there retired.

## 2012-05-25 NOTE — Assessment & Plan Note (Signed)
Plan-blood test assay to confirm this was. nontuberculous disease. It appears to be old and inactive.

## 2012-05-29 NOTE — Progress Notes (Signed)
Quick Note:  LMTCB ______ 

## 2012-05-30 NOTE — Progress Notes (Signed)
Quick Note:  LMTCB ______ 

## 2012-06-04 ENCOUNTER — Telehealth: Payer: Self-pay | Admitting: Internal Medicine

## 2012-06-04 NOTE — Telephone Encounter (Signed)
Notes Recorded by Waymon Budge, MD on 05/19/2012 at 5:20 PM Quantiferon TB blood assay is negative for previous TB exposure. ---  I spoke with patient about results and she verbalized understanding and had no questions

## 2012-06-04 NOTE — Progress Notes (Signed)
Quick Note:  LMTCB-will send letter if no response on or by 06-05-12. ______

## 2012-06-19 ENCOUNTER — Other Ambulatory Visit: Payer: Medicare Other

## 2012-07-07 ENCOUNTER — Ambulatory Visit (INDEPENDENT_AMBULATORY_CARE_PROVIDER_SITE_OTHER): Payer: Medicare Other | Admitting: Internal Medicine

## 2012-07-07 ENCOUNTER — Ambulatory Visit (INDEPENDENT_AMBULATORY_CARE_PROVIDER_SITE_OTHER)
Admission: RE | Admit: 2012-07-07 | Discharge: 2012-07-07 | Disposition: A | Discharge status: Home / Self Care | Payer: Medicare Other | Source: Ambulatory Visit | Attending: Internal Medicine | Admitting: Internal Medicine

## 2012-07-07 ENCOUNTER — Encounter: Payer: Self-pay | Admitting: Internal Medicine

## 2012-07-07 VITALS — BP 120/76 | HR 94 | Ht 66.0 in | Wt 212.0 lb

## 2012-07-07 DIAGNOSIS — J984 Other disorders of lung: Secondary | ICD-10-CM

## 2012-07-07 DIAGNOSIS — J8410 Pulmonary fibrosis, unspecified: Secondary | ICD-10-CM

## 2012-07-07 DIAGNOSIS — J449 Chronic obstructive pulmonary disease, unspecified: Secondary | ICD-10-CM

## 2012-07-07 DIAGNOSIS — J4489 Other specified chronic obstructive pulmonary disease: Secondary | ICD-10-CM

## 2012-07-07 DIAGNOSIS — R911 Solitary pulmonary nodule: Secondary | ICD-10-CM

## 2012-07-07 DIAGNOSIS — Z720 Tobacco use: Secondary | ICD-10-CM

## 2012-07-07 DIAGNOSIS — R918 Other nonspecific abnormal finding of lung field: Secondary | ICD-10-CM

## 2012-07-07 DIAGNOSIS — F17200 Nicotine dependence, unspecified, uncomplicated: Secondary | ICD-10-CM

## 2012-07-07 NOTE — Assessment & Plan Note (Signed)
Has remained off tobacco. No active bronchitis symptoms.

## 2012-07-07 NOTE — Assessment & Plan Note (Signed)
Remains abstinent 

## 2012-07-07 NOTE — Assessment & Plan Note (Signed)
Without active disease evident.

## 2012-07-07 NOTE — Patient Instructions (Addendum)
We will try again to get result of PET scan done last Fall at [facility].  Order- CXR    Dx lung nodules  Order- Schedule future CT chest, contrast, with BMET,      Dx lung nodules, COPD

## 2012-07-07 NOTE — Assessment & Plan Note (Signed)
This may not be related to her known old granulomatous disease.  Plan CXR today, anticipate CT 1 year after first, done 12/2011. We will try again to get old PET result [place]- state.

## 2012-07-07 NOTE — Progress Notes (Signed)
01/15/12- 66 yoF heavy smoker recently hosp for exacerbation of COPD and now referred for pulmonary management. Hospitalized 11/29 through 01/12/2012 with discharge diagnoses of acute respiratory failure with hypoxia, COPD, tobacco abuse.. She had moved to this area from the [place] in 2009. Since here she has had more bronchitis with season change. Recent hospital for typical exacerbation. Now in physical therapy for rehabilitation after bilateral total knee replacements (05/15/2011, 10/14/2011). She has smoked one pack per day since 1967 and states that she stopped smoking November 28, using patches. Breathing is better now than usual from prehospital baseline. She never has had a cough. Activity has been limited by her knees but she thinks she has less dyspnea on exertion now. She is not needing her Combivent but continues Spiriva. A right lower lobe nodule was found April 2013. CT chest 12/21/2011 described as stable well-circumscribed right lower lobe nodule and recommended PET. Noted diffuse old granulomatous disease with calcification including calcified mediastinal and hilar nodes, granulomas in spleen and calcified nodes in the upper abdomen. No history of TB exposure. Pulmonary embolism 2006/IVC filter. Negative VQ scan 10/21/2011. She reports having a PET scan through [facility] in [place] this summer. We need to verify that and get that report.  Treated for hypertension but denies heart disease. 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 Adopted. CT 12/21/11-reviewed IMPRESSION:  1. Stable well circumscribed nodule within the right lower lobe.  Consider PET CT for metabolic activity if indicated clinically.  2. Diffuse changes of prior granulomatous disease with calcified  left lower lobe granulomas, mediastinal and hilar calcified nodes,  calcified splenic granuloma, and calcified nodes in the upper  abdomen.  Original Report Authenticated By: Dwyane Dee,  M.D.   05/16/12- 61 yoF former heavy smoker followed for  COPD, lung nodule RLL  FOLLOWS FOR: pt states breathing is doing good overall(used Rescue inhaler once since hospital stay); review PFT and mammogram results with patient. States she quit smoking cigarettes in November, 2013. Had CT chest 12/21/2011 and PET scan at [facility],  her pulmonologist at [facility] retired. Now denies shortness of breath, cough, phlegm wheeze. Using Spiriva daily. History of bilateral lumpectomy. Mammograms were negative 01/23/2012.  CXR 01/04/12 IMPRESSION:  Coarse lung markings most likely represent chronic changes but  difficult to exclude mild edema. There is no focal airspace  disease.  Nodule in the right lower lung. This was better characterized on  the chest CT from 12/21/2011.  Original Report Authenticated By: Richarda Overlie, M.D. PFT 05/16/2012: Mild obstructive airways disease with insignificant response to bronchodilator, normal lung volumes, diffusion mildly reduced. FVC 3.08/96%, FEV1 2.01/86%, FEV1/FEC 0.65. TLC 95%, DLCO 73%.  07/07/12- 66 yoF former heavy smoker followed for  COPD, lung nodule RLL , Hx PE/ IVC filter FOLLOWS FOR: Denies any SOB, wheezing, cough, or congestion; overall breathing is doing good per patient. Tells me in past year she has had TKR both knees and lumbar spine sgy. She gives additional medical hx of PE x2 w/ IVC filter 2006. 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 Fibromyalgia. 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 Says breathing "fine". Discussed f/u nodules.   ROS-see HPI Constitutional:   No-   weight loss, night sweats, fevers, chills, fatigue, lassitude. HEENT:   No-  headaches, difficulty swallowing, tooth/dental problems, sore throat,       No-  sneezing, itching, ear ache, nasal congestion,  post nasal drip,  CV:  No-   chest pain, orthopnea, PND, swelling in lower  extremities, anasarca,  dizziness, palpitations Resp: no-shortness of breath with exertion or at rest.              No-   productive cough,  No non-productive cough,  No- coughing up of blood.              No-   change in color of mucus.  No- wheezing.   Skin: No-   rash or lesions. GI:  No-   heartburn, indigestion, abdominal pain, nausea, vomiting,  GU: . MS:  No-   joint pain or swelling. Neuro-     nothing unusual Psych:  No- change in mood or affect. No depression or anxiety.  No memory loss.  OBJ- Physical Exam General- Alert, Oriented, Affect-appropriate, Distress- none acute. Looks well, laughs easily Skin- rash-none, lesions- none, excoriation- none Lymphadenopathy- none Head- atraumatic            Eyes- Gross vision intact, PERRLA, conjunctivae and secretions clear            Ears- Hearing, canals-normal            Nose- Clear, no-Septal dev, mucus, polyps, erosion, perforation             Throat- Mallampati II , mucosa clear , drainage- none, tonsils- atrophic. Dentures Neck- flexible , trachea midline, no stridor , thyroid nl, carotid no bruit Chest - symmetrical excursion , unlabored           Heart/CV- RRR , no murmur , no gallop  , no rub, nl s1 s2                           - JVD- none , edema- none, stasis changes- none, varices- none           Lung- clear to P&A, wheeze- none, cough- none , dullness-none, rub- none           Chest wall-  Abd-  Br/ Gen/ Rectal- Not done, not indicated Extrem- cyanosis- none, clubbing, none, atrophy- none, strength- nl. Cane Neuro- grossly intact to observation

## 2012-07-10 NOTE — Progress Notes (Signed)
Quick Note:  Pt aware of results-order in EPIC already for CT in November-spoke with [facility] about going ahead and scheduling from that order now. ______

## 2012-07-15 ENCOUNTER — Other Ambulatory Visit (HOSPITAL_COMMUNITY): Payer: Self-pay | Admitting: Internal Medicine

## 2012-07-15 ENCOUNTER — Encounter: Payer: Self-pay | Admitting: Gastroenterology

## 2012-07-15 ENCOUNTER — Ambulatory Visit (INDEPENDENT_AMBULATORY_CARE_PROVIDER_SITE_OTHER)
Admission: RE | Admit: 2012-07-15 | Discharge: 2012-07-15 | Disposition: A | Discharge status: Home / Self Care | Payer: Medicare Other | Source: Ambulatory Visit | Attending: Internal Medicine | Admitting: Internal Medicine

## 2012-07-15 DIAGNOSIS — R918 Other nonspecific abnormal finding of lung field: Secondary | ICD-10-CM

## 2012-07-15 DIAGNOSIS — J449 Chronic obstructive pulmonary disease, unspecified: Secondary | ICD-10-CM

## 2012-07-15 DIAGNOSIS — Z780 Asymptomatic menopausal state: Secondary | ICD-10-CM

## 2012-07-16 ENCOUNTER — Telehealth: Payer: Self-pay | Admitting: Internal Medicine

## 2012-07-16 NOTE — Progress Notes (Signed)
Quick Note:  Spoke with patient-aware of results and would like to go ahead and have PET scan done thorough our office. Pt stated that her 2013 PET scan was done at [facility] in [place] by Dr Fredderick Severance. ______

## 2012-07-16 NOTE — Progress Notes (Signed)
Quick Note:  LMTCB at home and cell number. ______

## 2012-07-16 NOTE — Telephone Encounter (Signed)
Pt aware of CT Chest results and would like to have PET scan order from our office; (after 08-07-12)per CY okay to have done- order placed to [facility]. I will also get in touch with [facility]  Dr Claudie Fisherman in [place], [place] for 2013 PET scan results.

## 2012-07-21 ENCOUNTER — Encounter: Payer: Self-pay | Admitting: Gastroenterology

## 2012-08-06 ENCOUNTER — Encounter: Payer: Medicare Other | Admitting: Gastroenterology

## 2012-08-11 ENCOUNTER — Encounter (HOSPITAL_COMMUNITY)
Admission: RE | Admit: 2012-08-11 | Discharge: 2012-08-11 | Disposition: A | Discharge status: Home / Self Care | Payer: Medicare Other | Source: Ambulatory Visit | Attending: Internal Medicine | Admitting: Internal Medicine

## 2012-08-11 DIAGNOSIS — C3490 Malignant neoplasm of unspecified part of unspecified bronchus or lung: Secondary | ICD-10-CM | POA: Insufficient documentation

## 2012-08-11 MED ORDER — FLUDEOXYGLUCOSE F - 18 (FDG) INJECTION
19.6000 | Freq: Once | INTRAVENOUS | Status: AC | PRN
  Administered 2012-08-11: 19.6 via INTRAVENOUS

## 2012-08-13 ENCOUNTER — Telehealth: Payer: Self-pay | Admitting: Internal Medicine

## 2012-08-13 DIAGNOSIS — R911 Solitary pulmonary nodule: Secondary | ICD-10-CM

## 2012-08-13 NOTE — Telephone Encounter (Signed)
Pt is still waiting to hear from [place].

## 2012-08-13 NOTE — Progress Notes (Signed)
Quick Note:  LMTCB ______ 

## 2012-08-13 NOTE — Progress Notes (Signed)
Quick Note:  Breanna Dixon spoke with patient and ordered Needle Biopsy ______

## 2012-08-13 NOTE — Telephone Encounter (Signed)
Notes Recorded by Waymon Budge, MD on 08/11/2012 at 12:52 PM PET scan- The Right lower lobe nodule seems to be slowly growing and mildly active. It could be a slow growing cancer or a persistent area of infection.  I recommend we order needle biopsy by Interventional Radiology.   I spoke with patient about results and she verbalized understanding and had no questions. Order sent.

## 2012-08-15 ENCOUNTER — Ambulatory Visit (AMBULATORY_SURGERY_CENTER): Payer: Medicare Other | Admitting: *Deleted

## 2012-08-15 ENCOUNTER — Telehealth: Payer: Self-pay | Admitting: Internal Medicine

## 2012-08-15 VITALS — Ht 67.5 in | Wt 220.2 lb

## 2012-08-15 DIAGNOSIS — Z1211 Encounter for screening for malignant neoplasm of colon: Secondary | ICD-10-CM

## 2012-08-15 MED ORDER — MOVIPREP 100 G PO SOLR: 1.0000 | Freq: Once | ORAL | Status: DC

## 2012-08-15 NOTE — Progress Notes (Signed)
Denies allergies to eggs or soy products. Denies complications with anesthesia or sedation. 

## 2012-08-15 NOTE — Telephone Encounter (Signed)
I spoke with Breanna Dixon and she states the order has been sent and it is in review and once the radiologists reviews the case they will contact her to schedule. The pt is aware. Carron Curie, CMA

## 2012-08-18 ENCOUNTER — Other Ambulatory Visit: Payer: Self-pay | Admitting: Radiology

## 2012-08-19 ENCOUNTER — Encounter (HOSPITAL_COMMUNITY): Payer: Self-pay

## 2012-08-19 ENCOUNTER — Other Ambulatory Visit: Payer: Self-pay | Admitting: Internal Medicine

## 2012-08-19 ENCOUNTER — Ambulatory Visit (HOSPITAL_COMMUNITY)
Admission: RE | Admit: 2012-08-19 | Discharge: 2012-08-19 | Disposition: A | Discharge status: Home / Self Care | Payer: Medicare Other | Source: Ambulatory Visit | Attending: Internal Medicine | Admitting: Internal Medicine

## 2012-08-19 DIAGNOSIS — J449 Chronic obstructive pulmonary disease, unspecified: Secondary | ICD-10-CM | POA: Insufficient documentation

## 2012-08-19 DIAGNOSIS — R911 Solitary pulmonary nodule: Secondary | ICD-10-CM

## 2012-08-19 DIAGNOSIS — Z87891 Personal history of nicotine dependence: Secondary | ICD-10-CM | POA: Insufficient documentation

## 2012-08-19 DIAGNOSIS — Z853 Personal history of malignant neoplasm of breast: Secondary | ICD-10-CM | POA: Insufficient documentation

## 2012-08-19 DIAGNOSIS — I10 Essential (primary) hypertension: Secondary | ICD-10-CM | POA: Insufficient documentation

## 2012-08-19 DIAGNOSIS — J4489 Other specified chronic obstructive pulmonary disease: Secondary | ICD-10-CM | POA: Insufficient documentation

## 2012-08-19 DIAGNOSIS — Z96659 Presence of unspecified artificial knee joint: Secondary | ICD-10-CM | POA: Insufficient documentation

## 2012-08-19 LAB — BASIC METABOLIC PANEL
BUN: 18 mg/dL (ref 6–23)
Calcium: 9.5 mg/dL (ref 8.4–10.5)
Chloride: 102 mEq/L (ref 96–112)
Creatinine, Ser: 0.76 mg/dL (ref 0.50–1.10)
GFR calc Af Amer: >90 mL/min (ref >90)

## 2012-08-19 LAB — CBC
HCT: 37.6 % (ref 36.0–46.0)
MCV: 86.4 fL (ref 78.0–100.0)
RBC: 4.35 MIL/uL (ref 3.87–5.11)
WBC: 7.3 10*3/uL (ref 4.0–10.5)

## 2012-08-19 MED ORDER — METHYLPREDNISOLONE SODIUM SUCC 125 MG IJ SOLR
125.0000 mg | Freq: Once | INTRAMUSCULAR | Status: AC
  Administered 2012-08-19: 125 mg via INTRAVENOUS
  Filled 2012-08-19: qty 2

## 2012-08-19 MED ORDER — FENTANYL CITRATE 0.05 MG/ML IJ SOLN
INTRAMUSCULAR | Status: AC
  Filled 2012-08-19: qty 6

## 2012-08-19 MED ORDER — METHYLPREDNISOLONE SODIUM SUCC 125 MG IJ SOLR
INTRAMUSCULAR | Status: AC
  Filled 2012-08-19: qty 2

## 2012-08-19 MED ORDER — DIPHENHYDRAMINE HCL 50 MG/ML IJ SOLN
50.0000 mg | Freq: Once | INTRAMUSCULAR | Status: AC
  Administered 2012-08-19: 50 mg via INTRAVENOUS
  Filled 2012-08-19: qty 1

## 2012-08-19 MED ORDER — MIDAZOLAM HCL 2 MG/2ML IJ SOLN
INTRAMUSCULAR | Status: AC
  Filled 2012-08-19: qty 6

## 2012-08-19 MED ORDER — HYDROCODONE-ACETAMINOPHEN 5-325 MG PO TABS
1.0000 | ORAL_TABLET | ORAL | Status: DC | PRN
Start: 2012-08-19 — End: 2012-08-20
  Filled 2012-08-19: qty 2
  Filled 2012-08-19: qty 1

## 2012-08-19 MED ORDER — DIPHENHYDRAMINE HCL 50 MG/ML IJ SOLN
INTRAMUSCULAR | Status: AC
  Filled 2012-08-19: qty 1

## 2012-08-19 MED ORDER — SODIUM CHLORIDE 0.9 % IV SOLN
Freq: Once | INTRAVENOUS | Status: AC
  Administered 2012-08-19: 11:00:00 via INTRAVENOUS

## 2012-08-19 MED ORDER — ONDANSETRON HCL 4 MG/2ML IJ SOLN
INTRAMUSCULAR | Status: AC
  Filled 2012-08-19: qty 2

## 2012-08-19 NOTE — ED Notes (Signed)
After reviewing pre-procedure imaging, Dr. Fredia Sorrow has aborted the procedure. Pt returned to short stay center on bed for pending discharge.

## 2012-08-19 NOTE — H&P (Signed)
Breanna Dixon is an 67 y.o. female.   Chief Complaint: "I'm having a lung biopsy" HPI: Patient with history of smoking, COPD, breast carcinoma and mildly hypermetabolic RLL lung lesion noted on recent PET scan presents today for CT guided RLL lung lesion biopsy.  Past Medical History  Diagnosis Date  . Complication of anesthesia     woke up during previous surgery  . Cancer     breast hx  . Dysrhythmia     tachycardia  . Hypertension     take verapamil and toprol daily  . Bronchitis   . Seasonal allergies     flonase prn  . Blood transfusion 1974  . Ulcer, gastric, acute     hx of  . Fibromyalgia   . Mental disorder     hx of ptsd   . Lung mass April 2013    Right lower lobe lung mass  . Pneumonia     hx of.  5 x in life time.  [name] Head injury, closed, with concussion   . Kidney stone     hx of  . Headache(784.0)     migranes, last one was in Jun 12, 2011  . Arthritis   . Hypertension   . Pulmonary embolism 2006    IVC filter  . Systemic lupus erythematosus     Cutaneous- Plaquenil    Past Surgical History  Procedure Laterality Date  . Cholecystectomy  1994  . Back surgery  2000/2010    fusion  . Tonsillectomy  1967  . Appendectomy  1965  . Cystoscopy      multiple  . Urethral dilation  1971  . Breast surgery      bilateral partial masectomy  . Abdominal hysterectomy  1992  . Dilation and curettage of uterus    . Hernia repair    . Colonoscopy    . Knee arthroscopy    . Total knee arthroplasty  06/04/2011    Procedure: TOTAL KNEE ARTHROPLASTY;  Surgeon: Raymon Mutton, MD;  Location: [facility] OR;  Service: Orthopedics;  Laterality: Right;  . Vena cava filter placement  2006  . Incisional hernia repair    . Multiple tooth extractions    . Joint replacement    . Total knee arthroplasty  10/15/2011    Procedure: TOTAL KNEE ARTHROPLASTY;  Surgeon: Raymon Mutton, MD;  Location: [facility] OR;  Service: Orthopedics;  Laterality: Left;  . Ivc filter      Family History   Problem Relation Age of Onset  . Adopted: Yes  . Anesthesia problems Neg Hx   . Hypotension Neg Hx   . Malignant hyperthermia Neg Hx   . Pseudochol deficiency Neg Hx    Social History:  reports that she quit smoking about 7 months ago. Her smoking use included Cigarettes. She has a 5.25 pack-year smoking history. She has never used smokeless tobacco. She reports that she does not drink alcohol or use illicit drugs.  Allergies:  Allergies  Allergen Reactions  . Imitrex (Sumatriptan Base) Nausea And Vomiting    Rapid heart beat.  [name] Ketorolac Tromethamine Nausea And Vomiting    Rapid heart beat.  . Stadol (Butorphanol Tartrate) Nausea And Vomiting  . Ivp Dye (Iodinated Diagnostic Agents) Nausea Only and Palpitations    Can tolerate iodine  . Shellfish Allergy Nausea Only and Palpitations    Current outpatient prescriptions:albuterol-ipratropium (COMBIVENT) 18-103 MCG/ACT inhaler, Inhale 2 puffs into the lungs every 6 (six) hours as needed. For shortness of  breath, Disp: , Rfl: ;  carbamazepine (TEGRETOL XR) 200 MG 12 hr tablet, Take 200-400 mg by mouth 2 (two) times daily. 200mg  in am and 400mg  at bedtime, Disp: , Rfl: ;  clotrimazole-betamethasone (LOTRISONE) cream, Apply 1 application topically 2 (two) times daily., Disp: , Rfl:  DULoxetine (CYMBALTA) 60 MG capsule, Take 120 mg by mouth daily., Disp: , Rfl: ;  fenofibrate micronized (LOFIBRA) 134 MG capsule, Take 134 mg by mouth daily before breakfast., Disp: , Rfl: ;  fluticasone (FLONASE) 50 MCG/ACT nasal spray, Place 2 sprays into the nose 2 (two) times daily. , Disp: , Rfl: ;  furosemide (LASIX) 20 MG tablet, Take 1 tablet (20 mg total) by mouth daily., Disp: 30 tablet, Rfl: 2 hydroxychloroquine (PLAQUENIL) 200 MG tablet, Take 1 tablet by mouth 2 (two) times daily., Disp: , Rfl: ;  hydrOXYzine (ATARAX/VISTARIL) 25 MG tablet, Take 25 mg by mouth 3 (three) times daily as needed for itching (insomnia). , Disp: , Rfl: ;  ibuprofen  (ADVIL,MOTRIN) 800 MG tablet, Take 800 mg by mouth every 8 (eight) hours as needed for pain., Disp: , Rfl: ;  LORazepam (ATIVAN) 2 MG tablet, Take 1 mg by mouth 3 (three) times daily., Disp: , Rfl:  methocarbamol (ROBAXIN) 500 MG tablet, Take 500 mg by mouth 3 (three) times daily as needed (For restless legs syndrome). , Disp: , Rfl: ;  metoprolol succinate (TOPROL-XL) 100 MG 24 hr tablet, Take 1 tablet (100 mg total) by mouth daily. Take with or immediately following a meal., Disp: 30 tablet, Rfl: 1;  pravastatin (PRAVACHOL) 40 MG tablet, Take 1 tablet by mouth daily., Disp: , Rfl:  pregabalin (LYRICA) 75 MG capsule, Take 75 mg by mouth 2 (two) times daily., Disp: , Rfl: ;  tiotropium (SPIRIVA) 18 MCG inhalation capsule, Place 1 capsule (18 mcg total) into inhaler and inhale daily., Disp: 30 capsule, Rfl: 2;  verapamil (COVERA HS) 240 MG (CO) 24 hr tablet, Take 240 mg by mouth at bedtime., Disp: , Rfl: ;  MOVIPREP 100 G SOLR, Take 1 kit (100 g total) by mouth once., Disp: 1 kit, Rfl: 0   Results for orders placed during the hospital encounter of 08/19/12 (from the past 48 hour(s))  APTT     Status: None   Collection Time    08/19/12 10:51 AM      Result Value Range   aPTT 29  24 - 37 seconds  CBC     Status: None   Collection Time    08/19/12 10:51 AM      Result Value Range   WBC 7.3  4.0 - 10.5 K/uL   RBC 4.35  3.87 - 5.11 MIL/uL   Hemoglobin 12.2  12.0 - 15.0 g/dL   HCT 16.1  09.6 - 04.5 %   MCV 86.4  78.0 - 100.0 fL   MCH 28.0  26.0 - 34.0 pg   MCHC 32.4  30.0 - 36.0 g/dL   RDW 40.9  81.1 - 91.4 %   Platelets 285  150 - 400 K/uL  PROTIME-INR     Status: None   Collection Time    08/19/12 10:51 AM      Result Value Range   Prothrombin Time 13.2  11.6 - 15.2 seconds   INR 1.02  0.00 - 1.49  BASIC METABOLIC PANEL     Status: Abnormal   Collection Time    08/19/12 11:30 AM      Result Value Range   Sodium 141  135 - 145 mEq/L   Potassium 4.3  3.5 - 5.1 mEq/L   Chloride 102  96 -  112 mEq/L   CO2 26  19 - 32 mEq/L   Glucose, Bld 103 (*) 70 - 99 mg/dL   BUN 18  6 - 23 mg/dL   Creatinine, Ser 4.09  0.50 - 1.10 mg/dL   Calcium 9.5  8.4 - 81.1 mg/dL   GFR calc non Af Amer 87 (*) >90 mL/min   GFR calc Af Amer >90  >90 mL/min   Comment:            The eGFR has been calculated     using the CKD EPI equation.     This calculation has not been     validated in all clinical     situations.     eGFR's persistently     <90 mL/min signify     possible Chronic Kidney Disease.   No results found.  Review of Systems  Constitutional: Negative for fever and chills.  Respiratory: Negative for cough and shortness of breath.   Cardiovascular: Negative for chest pain.  Gastrointestinal: Negative for vomiting and abdominal pain.       Occ nausea  Musculoskeletal:       Occ back pain  Neurological: Negative for headaches.    Blood pressure 119/50, pulse 65, temperature 98.8 F (37.1 C), temperature source Oral, resp. rate 20, height 5' 7.5" (1.715 m), weight 220 lb (99.791 kg), last menstrual period 06/04/2011, SpO2 98.00%. Physical Exam  Constitutional: She is oriented to person, place, and time. She appears well-developed and well-nourished.  Cardiovascular: Normal rate and regular rhythm.   Respiratory: Effort normal and breath sounds normal.  GI: Soft. Bowel sounds are normal. There is no tenderness.  Musculoskeletal: Normal range of motion. She exhibits no edema.  Neurological: She is alert and oriented to person, place, and time.     Assessment/Plan Pt with hx of breast carcinoma, COPD/smoking and mildly hypermetabolic RLL lung lesion noted on recent PET scan. Plan is for CT guided RLL lung lesion biopsy today. Details/risks of procedure d/w pt /son with their understanding and consent.  Manuella Blackson,D KEVIN 08/19/2012, 12:32 PM

## 2012-08-19 NOTE — H&P (Signed)
Agree 

## 2012-08-19 NOTE — Progress Notes (Signed)
After arrival back to Short Stay pt states she feels fine and wants to go home. Headache she had is resolving and ambulating in room without problems.

## 2012-08-20 ENCOUNTER — Ambulatory Visit (HOSPITAL_COMMUNITY): Admission: RE | Admit: 2012-08-20 | Payer: Medicare Other | Source: Ambulatory Visit

## 2012-08-21 ENCOUNTER — Ambulatory Visit (HOSPITAL_COMMUNITY)
Admission: RE | Admit: 2012-08-21 | Discharge: 2012-08-21 | Disposition: A | Discharge status: Home / Self Care | Payer: Medicare Other | Source: Ambulatory Visit | Attending: Internal Medicine | Admitting: Internal Medicine

## 2012-08-21 DIAGNOSIS — Z780 Asymptomatic menopausal state: Secondary | ICD-10-CM

## 2012-08-21 DIAGNOSIS — Z13820 Encounter for screening for osteoporosis: Secondary | ICD-10-CM | POA: Insufficient documentation

## 2012-08-27 ENCOUNTER — Telehealth: Payer: Self-pay | Admitting: Internal Medicine

## 2012-08-27 NOTE — Telephone Encounter (Signed)
[email or web address].Jaevin Medearis, CMA  

## 2012-08-28 NOTE — Telephone Encounter (Signed)
Pt aware. Nothing further was needed 

## 2012-08-28 NOTE — Telephone Encounter (Signed)
Ok - we will just follow this lung nodule over time. Keep December appointment unless needed sooner.

## 2012-08-28 NOTE — Telephone Encounter (Signed)
Returning call can be reached at [phone].Raylene Everts

## 2012-08-28 NOTE — Telephone Encounter (Signed)
Spoke with the pt She states that Dr Fredia Sorrow decided against doing lung bx since her nodule appeared benign  She is asking if there is any further testing or recs per CDY Please advise, thanks!

## 2012-08-28 NOTE — Telephone Encounter (Signed)
LMTCB X2

## 2012-08-29 ENCOUNTER — Ambulatory Visit (AMBULATORY_SURGERY_CENTER): Payer: Medicare Other | Admitting: Gastroenterology

## 2012-08-29 ENCOUNTER — Encounter: Payer: Self-pay | Admitting: Gastroenterology

## 2012-08-29 VITALS — BP 113/78 | HR 64 | Temp 96.2°F | Resp 19 | Ht 67.5 in | Wt 220.0 lb

## 2012-08-29 DIAGNOSIS — Z1211 Encounter for screening for malignant neoplasm of colon: Secondary | ICD-10-CM

## 2012-08-29 DIAGNOSIS — K5730 Diverticulosis of large intestine without perforation or abscess without bleeding: Secondary | ICD-10-CM

## 2012-08-29 DIAGNOSIS — D126 Benign neoplasm of colon, unspecified: Secondary | ICD-10-CM

## 2012-08-29 MED ORDER — SODIUM CHLORIDE 0.9 % IV SOLN: 500.0000 mL | INTRAVENOUS | Status: DC

## 2012-08-29 NOTE — Progress Notes (Signed)
Patient did not experience any of the following events: a burn prior to discharge; a fall within the facility; wrong site/side/patient/procedure/implant event; or a hospital transfer or hospital admission upon discharge from the facility. (G8907) Patient did not have preoperative order for IV antibiotic SSI prophylaxis. (G8918)  

## 2012-08-29 NOTE — Progress Notes (Signed)
Called to room to assist during endoscopic procedure.  Patient ID and intended procedure confirmed with present staff. Received instructions for my participation in the procedure from the performing physician.  

## 2012-08-29 NOTE — Progress Notes (Signed)
Procedure ends, to recovery, report given and VSS. 

## 2012-08-29 NOTE — Op Note (Signed)
[facility] [address] [place], [address]   COLONOSCOPY PROCEDURE REPORT  PATIENT: Breanna Dixon, Breanna A.  MR#: [id number] BIRTHDATE: [birth year] , 67  yrs. old GENDER: Female ENDOSCOPIST: Mardella Layman, MD, Clementeen Graham REFERRED BY:  Barry Brunner, M.D. PROCEDURE DATE:  08/29/2012 PROCEDURE:   Colonoscopy with snare polypectomy ASA CLASS:   Class III INDICATIONS:average risk screening. MEDICATIONS: Propofol (Diprivan) 24 mg IV  DESCRIPTION OF PROCEDURE:   After the risks and benefits and of the procedure were explained, informed consent was obtained.  A digital rectal exam revealed no abnormalities of the rectum.    The LB [id number]  endoscope was introduced through the anus and advanced to the cecum, which was identified by both the appendix and ileocecal valve .  The quality of the prep was good, using MoviPrep .  The instrument was then slowly withdrawn as the colon was fully examined.     COLON FINDINGS: There was mild diverticulosis noted in the descending colon and sigmoid colon with associated tortuosity.   A polypoid shaped sessile polyp ranging between 5-89mm in size was found in the descending colon.  A polypectomy was performed using snare cautery.  The resection was complete and the polyp tissue was completely retrieved.   The colon was otherwise normal.  There was no diverticulosis, inflammation, polyps or cancers unless previously stated.     Retroflexed views revealed no abnormalities. The scope was then withdrawn from the patient and the procedure completed.  COMPLICATIONS: There were no complications. ENDOSCOPIC IMPRESSION: 1.   There was mild diverticulosis noted in the descending colon and sigmoid colon 2.   Sessile polyp ranging between 5-70mm in size was found in the descending colon; polypectomy was performed using snare cautery 3.   The colon was otherwise normal  RECOMMENDATIONS: 1.  Await biopsy results 2.  Repeat colonoscopy  in 5 years if polyp adenomatous; otherwise 10 years 3.  High fiber diet   REPEAT EXAM:  NF:[id number] Magda Kiel, MD  _______________________________ eSigned:  Mardella Layman, MD, [facility] 08/29/2012 9:23 AM

## 2012-08-29 NOTE — Progress Notes (Signed)
NO EGG OR SOY ALLERGY. EWM 

## 2012-08-29 NOTE — Patient Instructions (Addendum)

## 2012-09-01 ENCOUNTER — Telehealth: Payer: Self-pay

## 2012-09-01 NOTE — Telephone Encounter (Signed)
Unable to leave message mailbox full.

## 2012-09-02 ENCOUNTER — Encounter: Payer: Self-pay | Admitting: Gastroenterology

## 2013-01-06 ENCOUNTER — Ambulatory Visit (INDEPENDENT_AMBULATORY_CARE_PROVIDER_SITE_OTHER): Payer: Medicare Other | Admitting: Internal Medicine

## 2013-01-06 ENCOUNTER — Encounter: Payer: Self-pay | Admitting: Internal Medicine

## 2013-01-06 VITALS — BP 102/62 | HR 75 | Temp 98.9°F | Ht 66.0 in | Wt 215.8 lb

## 2013-01-06 DIAGNOSIS — J449 Chronic obstructive pulmonary disease, unspecified: Secondary | ICD-10-CM

## 2013-01-06 DIAGNOSIS — R911 Solitary pulmonary nodule: Secondary | ICD-10-CM

## 2013-01-06 MED ORDER — DOXYCYCLINE HYCLATE 100 MG PO TABS: ORAL_TABLET | ORAL | Status: DC

## 2013-01-06 MED ORDER — FLUCONAZOLE 150 MG PO TABS: ORAL_TABLET | ORAL | Status: DC

## 2013-01-06 NOTE — Progress Notes (Signed)
01/15/12- 52 yoF heavy smoker recently hosp for exacerbation of COPD and now referred for pulmonary management. Hospitalized 11/29 through 01/12/2012 with discharge diagnoses of acute respiratory failure with hypoxia, COPD, tobacco abuse.. She had moved to this area from the [place] in 2009. Since here she has had more bronchitis with season change. Recent hospital for typical exacerbation. Now in physical therapy for rehabilitation after bilateral total knee replacements (05/15/2011, 10/14/2011). She has smoked one pack per day since 1967 and states that she stopped smoking November 28, using patches. Breathing is better now than usual from prehospital baseline. She never has had a cough. Activity has been limited by her knees but she thinks she has less dyspnea on exertion now. She is not needing her Combivent but continues Spiriva. A right lower lobe nodule was found April 2013. CT chest 12/21/2011 described as stable well-circumscribed right lower lobe nodule and recommended PET. Noted diffuse old granulomatous disease with calcification including calcified mediastinal and hilar nodes, granulomas in spleen and calcified nodes in the upper abdomen. No history of TB exposure. Pulmonary embolism 2006/IVC filter. Negative VQ scan 10/21/2011. She reports having a PET scan through [facility] in [place] this summer. We need to verify that and get that report.  Treated for hypertension but denies heart disease. 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 Adopted. CT 12/21/11-reviewed IMPRESSION:  1. Stable well circumscribed nodule within the right lower lobe.  Consider PET CT for metabolic activity if indicated clinically.  2. Diffuse changes of prior granulomatous disease with calcified  left lower lobe granulomas, mediastinal and hilar calcified nodes,  calcified splenic granuloma, and calcified nodes in the upper  abdomen.  Original Report Authenticated By: Ivar Drape,  M.D.   05/16/12- 57 yoF former heavy smoker followed for  COPD, lung nodule RLL  FOLLOWS FOR: pt states breathing is doing good overall(used Rescue inhaler once since hospital stay); review PFT and mammogram results with patient. States she quit smoking cigarettes in November, 2013. Had CT chest 12/21/2011 and PET scan at [facility],  her pulmonologist at [facility] retired. Now denies shortness of breath, cough, phlegm wheeze. Using Spiriva daily. History of bilateral lumpectomy. Mammograms were negative 01/23/2012.  CXR 01/04/12 IMPRESSION:  Coarse lung markings most likely represent chronic changes but  difficult to exclude mild edema. There is no focal airspace  disease.  Nodule in the right lower lung. This was better characterized on  the chest CT from 12/21/2011.  Original Report Authenticated By: Markus Daft, M.D. PFT 05/16/2012: Mild obstructive airways disease with insignificant response to bronchodilator, normal lung volumes, diffusion mildly reduced. FVC 3.08/96%, FEV1 2.01/86%, FEV1/FEC 0.65. TLC 95%, DLCO 73%.  07/07/12- 68 yoF former heavy smoker followed for  COPD, lung nodule RLL , Hx PE/ IVC filter FOLLOWS FOR: Denies any SOB, wheezing, cough, or congestion; overall breathing is doing good per patient. Tells me in past year she has had TKR both knees and lumbar spine sgy. She gives additional medical hx of PE x2 w/ IVC filter 2006. 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 Fibromyalgia. 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 Says breathing "fine". Discussed f/u nodules.  01/06/13- 61 yoF former heavy smoker followed for  COPD, lung nodule RLL , Hx PE/ IVC filter FOLLOWS FOR:at primary md 1 wk. ago had rales,sob-worse,chest pain and tightness,cough-yellow x 2 wks.,wheezing ,fcs  Says she felt this way last year in  the fall. Recent chest congestion, cough with clear or trace yellow, tight,  back pain off and on, some low-grade fever and sweats. Using rescue inhaler for the first time in a year. Not smoking. CT chest 08/13/12 IMPRESSION:  Stable 1.9 cm smoothly marginated ovoid lesion in the right lower  lobe. IV contrast was administered today and this does not appear  to represent an AV malformation. The nodule also shows no evidence        (IR did not do needle bx.) of contrast enhancement. Given all of these factors as well as the  presence of multiple abutting blood vessels which may make the  biopsy of higher risk, decision was made to not perform a  percutaneous biopsy today and follow the lesion by imaging.  Findings were discussed directly with Dr. Maple Hudson.  Original Report Authenticated By: Irish Lack, M.D. PET elsewhere 06/29/11- IVC filter, old granulomatous disease. RLL nodule 18/16 mm, SUV 1.4 PET 07/16/12 IMPRESSION:  1.9 x 1.8 cm right lower lobe pulmonary nodule, with suspected slow  interval growth and associated vague hypermetabolism, max SUV 2.5.  While indeterminate, the overall appearance is worrisome for  primary bronchogenic neoplasm. Consider bronchoscopy or  percutaneous biopsy for further evaluation.  No evidence of metastatic disease.  Original Report Authenticated By: Charline Bills, M.D  ROS-see HPI Constitutional:   No-   weight loss, night sweats, +fevers, chills, fatigue, lassitude. HEENT:   No-  headaches, difficulty swallowing, tooth/dental problems, sore throat,       No-  sneezing, itching, ear ache, nasal congestion, post nasal drip,  CV:  No-   chest pain, orthopnea, PND, swelling in lower extremities, anasarca,  dizziness, palpitations Resp: no-shortness of breath with exertion or at rest.              +cough,  No non-productive cough,  No- coughing up of blood.              No-   change in color of mucus.  + wheezing.   Skin: No-   rash or lesions. GI:  No-   heartburn, indigestion, abdominal pain, nausea, vomiting,  GU: . MS:   No-   joint pain or swelling. Neuro-     nothing unusual Psych:  No- change in mood or affect. No depression or anxiety.  No memory loss.  OBJ- Physical Exam General- Alert, Oriented, Affect-appropriate, Distress- none acute.  Skin- rash-none, lesions- none, excoriation- none Lymphadenopathy- none Head- atraumatic            Eyes- Gross vision intact, PERRLA, conjunctivae and secretions clear            Ears- Hearing, canals-normal            Nose- Clear, no-Septal dev, mucus, polyps, erosion, perforation             Throat- Mallampati II , mucosa clear , drainage- none, tonsils- atrophic. Dentures Neck- flexible , trachea midline, no stridor , thyroid nl, carotid no bruit Chest - symmetrical excursion , unlabored           Heart/CV- RRR , no murmur , no gallop  , no rub, nl s1 s2                           - JVD- none , edema- none, stasis changes- none, varices- none           Lung- clear to P&A, wheeze- none, cough- none ,  dullness-none, rub- none           Chest wall-  Abd-  Br/ Gen/ Rectal- Not done, not indicated Extrem- cyanosis- none, clubbing, none, atrophy- none, strength- nl. Cane Neuro- grossly intact to observation

## 2013-01-06 NOTE — Patient Instructions (Signed)
Script doxycycline antibiotic sent  Script Diflucan sent  You can also try taking a probiotic while you are on the antibiotic- like Florastor or Align, or Activia yogurt  Order- future chest CT non-contrast  In 6 months, before next ov    For dx lung nodule

## 2013-01-25 NOTE — Assessment & Plan Note (Signed)
Acute exacerbation, seasonal. Suspect viral infection. Plan-doxycycline, Diflucan requested.

## 2013-01-25 NOTE — Assessment & Plan Note (Signed)
Discussed recommendation not to needlebiopsy at this time, from interventional radiology. Plan-six-month followup chest CT for lung nodule

## 2013-07-07 ENCOUNTER — Ambulatory Visit: Payer: Medicare Other | Admitting: Internal Medicine

## 2013-07-14 ENCOUNTER — Ambulatory Visit (INDEPENDENT_AMBULATORY_CARE_PROVIDER_SITE_OTHER): Payer: Medicare Other | Admitting: Internal Medicine

## 2013-07-14 ENCOUNTER — Ambulatory Visit (INDEPENDENT_AMBULATORY_CARE_PROVIDER_SITE_OTHER)
Admission: RE | Admit: 2013-07-14 | Discharge: 2013-07-14 | Disposition: A | Discharge status: Home / Self Care | Payer: Medicare Other | Source: Ambulatory Visit | Attending: Internal Medicine | Admitting: Internal Medicine

## 2013-07-14 ENCOUNTER — Encounter: Payer: Self-pay | Admitting: Internal Medicine

## 2013-07-14 VITALS — BP 110/70 | HR 70 | Ht 66.0 in | Wt 218.0 lb

## 2013-07-14 DIAGNOSIS — R911 Solitary pulmonary nodule: Secondary | ICD-10-CM

## 2013-07-14 DIAGNOSIS — J8410 Pulmonary fibrosis, unspecified: Secondary | ICD-10-CM

## 2013-07-14 DIAGNOSIS — I10 Essential (primary) hypertension: Secondary | ICD-10-CM

## 2013-07-14 DIAGNOSIS — J984 Other disorders of lung: Secondary | ICD-10-CM

## 2013-07-14 MED ORDER — IPRATROPIUM-ALBUTEROL 18-103 MCG/ACT IN AERO: 2.0000 | INHALATION_SPRAY | Freq: Four times a day (QID) | RESPIRATORY_TRACT | Status: DC | PRN

## 2013-07-14 NOTE — Patient Instructions (Addendum)
Script for Combivent refill printed  Ok to see how you do off Spiriva- use it when needed  [facility]- Would like to establish with a primary physician

## 2013-07-14 NOTE — Progress Notes (Signed)
01/15/12- 52 yoF heavy smoker recently hosp for exacerbation of COPD and now referred for pulmonary management. Hospitalized 11/29 through 01/12/2012 with discharge diagnoses of acute respiratory failure with hypoxia, COPD, tobacco abuse.. She had moved to this area from the [place] in 2009. Since here she has had more bronchitis with season change. Recent hospital for typical exacerbation. Now in physical therapy for rehabilitation after bilateral total knee replacements (05/15/2011, 10/14/2011). She has smoked one pack per day since 1967 and states that she stopped smoking November 28, using patches. Breathing is better now than usual from prehospital baseline. She never has had a cough. Activity has been limited by her knees but she thinks she has less dyspnea on exertion now. She is not needing her Combivent but continues Spiriva. A right lower lobe nodule was found April 2013. CT chest 12/21/2011 described as stable well-circumscribed right lower lobe nodule and recommended PET. Noted diffuse old granulomatous disease with calcification including calcified mediastinal and hilar nodes, granulomas in spleen and calcified nodes in the upper abdomen. No history of TB exposure. Pulmonary embolism 2006/IVC filter. Negative VQ scan 10/21/2011. She reports having a PET scan through [facility] in [place] this summer. We need to verify that and get that report.  Treated for hypertension but denies heart disease. 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 Adopted. CT 12/21/11-reviewed IMPRESSION:  1. Stable well circumscribed nodule within the right lower lobe.  Consider PET CT for metabolic activity if indicated clinically.  2. Diffuse changes of prior granulomatous disease with calcified  left lower lobe granulomas, mediastinal and hilar calcified nodes,  calcified splenic granuloma, and calcified nodes in the upper  abdomen.  Original Report Authenticated By: Ivar Drape,  M.D.   05/16/12- 57 yoF former heavy smoker followed for  COPD, lung nodule RLL  FOLLOWS FOR: pt states breathing is doing good overall(used Rescue inhaler once since hospital stay); review PFT and mammogram results with patient. States she quit smoking cigarettes in November, 2013. Had CT chest 12/21/2011 and PET scan at [facility],  her pulmonologist at [facility] retired. Now denies shortness of breath, cough, phlegm wheeze. Using Spiriva daily. History of bilateral lumpectomy. Mammograms were negative 01/23/2012.  CXR 01/04/12 IMPRESSION:  Coarse lung markings most likely represent chronic changes but  difficult to exclude mild edema. There is no focal airspace  disease.  Nodule in the right lower lung. This was better characterized on  the chest CT from 12/21/2011.  Original Report Authenticated By: Markus Daft, M.D. PFT 05/16/2012: Mild obstructive airways disease with insignificant response to bronchodilator, normal lung volumes, diffusion mildly reduced. FVC 3.08/96%, FEV1 2.01/86%, FEV1/FEC 0.65. TLC 95%, DLCO 73%.  07/07/12- 68 yoF former heavy smoker followed for  COPD, lung nodule RLL , Hx PE/ IVC filter FOLLOWS FOR: Denies any SOB, wheezing, cough, or congestion; overall breathing is doing good per patient. Tells me in past year she has had TKR both knees and lumbar spine sgy. She gives additional medical hx of PE x2 w/ IVC filter 2006. 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 Fibromyalgia. 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 Says breathing "fine". Discussed f/u nodules.  01/06/13- 61 yoF former heavy smoker followed for  COPD, lung nodule RLL , Hx PE/ IVC filter FOLLOWS FOR:at primary md 1 wk. ago had rales,sob-worse,chest pain and tightness,cough-yellow x 2 wks.,wheezing ,fcs  Says she felt this way last year in  the fall. Recent chest congestion, cough with clear or trace yellow, tight,  back pain off and on, some low-grade fever and sweats. Using rescue inhaler for the first time in a year. Not smoking. CT chest 08/13/12 IMPRESSION:  Stable 1.9 cm smoothly marginated ovoid lesion in the right lower  lobe. IV contrast was administered today and this does not appear  to represent an AV malformation. The nodule also shows no evidence (IR did not do needle bx.) of contrast enhancement. Given all of these factors as well as the  presence of multiple abutting blood vessels which may make the  biopsy of higher risk, decision was made to not perform a  percutaneous biopsy today and follow the lesion by imaging.  Findings were discussed directly with Dr. Annamaria Boots.  Original Report Authenticated By: Aletta Edouard, M.D. PET elsewhere 06/29/11- IVC filter, old granulomatous disease. RLL nodule 18/16 mm, SUV 1.4 PET 07/16/12 IMPRESSION:  1.9 x 1.8 cm right lower lobe pulmonary nodule, with suspected slow  interval growth and associated vague hypermetabolism, max SUV 2.5.  While indeterminate, the overall appearance is worrisome for  primary bronchogenic neoplasm. Consider bronchoscopy or  percutaneous biopsy for further evaluation.  No evidence of metastatic disease.  Original Report Authenticated By: Julian Hy, M.D  07/14/13- 39 yoF former heavy smoker followed for  COPD, lung nodule RLL , Hx PE/ IVC filter, hx discoid lupus rash FOLLOWS FOR:No trouble with breathing as long as she is not in pain. Per note last visit, IR did not bx nodule. Moved to old home with many steps. Using Combivent less than once daily while continuing Spiriva. Discoid lupus rash is worse in heat CT 07/14/13 IMPRESSION:  Old granulomatous disease.  Stable 3 mm RIGHT upper lobe noncalcified pulmonary nodule.  Stable lobulated RIGHT lower lobe mass ; due to growth on prior exam  and hypermetabolism on PET-CT, recommend continued surveillance to  exclude malignancy.  No new intrathoracic abnormalities.   Electronically Signed  By: Lavonia Dana M.D.  On: 07/14/2013 10:30   ROS-see HPI Constitutional:   No-   weight loss, night sweats, +fevers, chills, fatigue, lassitude. HEENT:   No-  headaches, difficulty swallowing, tooth/dental problems, sore throat,       No-  sneezing, itching, ear ache, nasal congestion, post nasal drip,  CV:  No-   chest pain, orthopnea, PND, swelling in lower extremities, anasarca,  dizziness, palpitations Resp: no-shortness of breath with exertion or at rest.              +cough,  No non-productive cough,  No- coughing up of blood.              No-   change in color of mucus.  No- wheezing.   Skin: + rash or lesions. GI:  No-   heartburn, indigestion, abdominal pain, nausea, vomiting,  GU: . MS:  No-   joint pain or swelling. Neuro-     nothing unusual Psych:  No- change in mood or affect. No depression or anxiety.  No memory loss.  OBJ- Physical Exam General- Alert, Oriented, Affect-appropriate, Distress- none acute.  Skin- + rash dorsum left hand reported discoid lupus Lymphadenopathy- none Head- atraumatic            Eyes- Gross vision intact, PERRLA, conjunctivae and secretions clear            Ears- Hearing, canals-normal            Nose- Clear, no-Septal dev, mucus, polyps, erosion, perforation  Throat- Mallampati II , mucosa clear , drainage- none, tonsils- atrophic. Dentures Neck- flexible , trachea midline, no stridor , thyroid nl, carotid no bruit Chest - symmetrical excursion , unlabored           Heart/CV- RRR , no murmur , no gallop  , no rub, nl s1 s2                           - JVD- none , edema- none, stasis changes- none, varices- none           Lung- clear to P&A, wheeze- none, cough- none , dullness-none, rub- none           Chest wall-  Abd-  Br/ Gen/ Rectal- Not done, not indicated Extrem- cyanosis- none, clubbing, none, atrophy- none, strength- nl. + bilateral TKR scars, Cane Neuro- grossly intact to observation

## 2013-09-13 NOTE — Assessment & Plan Note (Signed)
Good control Plan-refill Combivent Respimat for occasional use; try off Spiriva

## 2013-09-13 NOTE — Assessment & Plan Note (Signed)
Plan occasional chest x-ray for surveillance

## 2013-09-13 NOTE — Assessment & Plan Note (Signed)
Plan-watch for stability

## 2013-12-28 ENCOUNTER — Telehealth: Payer: Self-pay | Admitting: Internal Medicine

## 2013-12-28 NOTE — Telephone Encounter (Signed)
lmtcb X1 for patient

## 2013-12-29 NOTE — Telephone Encounter (Signed)
Patient aware of rec's per CY.  Pt does not need refills at this time, has Spiriva on hand at home.  Nothing further needed.

## 2013-12-29 NOTE — Telephone Encounter (Signed)
Pt returning call.Stanley A Dalton ° °

## 2013-12-29 NOTE — Telephone Encounter (Signed)
Ok to resume, and to refill as needed

## 2013-12-29 NOTE — Telephone Encounter (Signed)
lmtcb for pt.  

## 2013-12-29 NOTE — Telephone Encounter (Signed)
Called and spoke with pt and she stated that she needs to know if she needs to restart her spiriva.  She stated that her grandchild has been tx for PNA.  And she wanted to see if this medication needed to be restarted.  CY please advise what the pt needs to do.    Last ov--07/14/2013 Next ov--01/13/2014  Allergies  Allergen Reactions  . Imitrex [Sumatriptan Base] Nausea And Vomiting    Rapid heart beat.  [name] Ketorolac Tromethamine Nausea And Vomiting    Rapid heart beat.  . Stadol [Butorphanol Tartrate] Nausea And Vomiting  . Ivp Dye [Iodinated Diagnostic Agents] Nausea Only and Palpitations    Can tolerate iodine  . Shellfish Allergy Nausea Only and Palpitations    Current Outpatient Prescriptions on File Prior to Visit  Medication Sig Dispense Refill  . albuterol-ipratropium (COMBIVENT) 18-103 MCG/ACT inhaler Inhale 2 puffs into the lungs every 6 (six) hours as needed. For shortness of breath 1 Inhaler prn  . carbamazepine (TEGRETOL XR) 200 MG 12 hr tablet Take 200-400 mg by mouth 2 (two) times daily. 200mg  in am and 400mg  at bedtime    . clobetasol cream (TEMOVATE) 0.05 %     . clotrimazole-betamethasone (LOTRISONE) cream Apply 1 application topically 2 (two) times daily. 2 times a day as needed    . DULoxetine (CYMBALTA) 60 MG capsule Take 120 mg by mouth daily.    . fenofibrate micronized (LOFIBRA) 134 MG capsule Take 134 mg by mouth daily before breakfast.    . fluticasone (FLONASE) 50 MCG/ACT nasal spray Place 2 sprays into the nose 2 (two) times daily.     . furosemide (LASIX) 20 MG tablet Take 1 tablet (20 mg total) by mouth daily. 30 tablet 2  . HYDROcodone-acetaminophen (NORCO) 10-325 MG per tablet     . hydroxychloroquine (PLAQUENIL) 200 MG tablet Take 1 tablet by mouth 2 (two) times daily.    . hydrOXYzine (ATARAX/VISTARIL) 25 MG tablet Take 25 mg by mouth 3 (three) times daily as needed for itching (insomnia).     [name] ibuprofen (ADVIL,MOTRIN) 800 MG tablet Take 800 mg by mouth  every 8 (eight) hours as needed for pain.    [name] LORazepam (ATIVAN) 2 MG tablet Take 1 mg by mouth 3 (three) times daily.    . methocarbamol (ROBAXIN) 500 MG tablet Take 500 mg by mouth 3 (three) times daily as needed (For restless legs syndrome).     . methocarbamol (ROBAXIN) 500 MG tablet take 1 tablet by mouth three times a day    . metoprolol succinate (TOPROL-XL) 100 MG 24 hr tablet Take 1 tablet (100 mg total) by mouth daily. Take with or immediately following a meal. 30 tablet 1  . mometasone (NASONEX) 50 MCG/ACT nasal spray Frequency:   Dosage:50   MCG/ACT  Instructions:Nasonex (50MCG/ACT SUSP, 2 sprays each nostril Nasal )  Note:    . pimecrolimus (ELIDEL) 1 % cream     . pravastatin (PRAVACHOL) 40 MG tablet Take 1 tablet by mouth daily.    . pregabalin (LYRICA) 75 MG capsule Take 75 mg by mouth 2 (two) times daily.    [name] tiotropium (SPIRIVA) 18 MCG inhalation capsule Place 1 capsule (18 mcg total) into inhaler and inhale daily. 30 capsule 2  . traMADol (ULTRAM) 50 MG tablet Take 50 mg by mouth.    . verapamil (COVERA HS) 240 MG (CO) 24 hr tablet Take 240 mg by mouth at bedtime.    . Vitamin D, Ergocalciferol, (DRISDOL)  50000 UNITS CAPS capsule Take by mouth.     No current facility-administered medications on file prior to visit.

## 2013-12-29 NOTE — Telephone Encounter (Signed)
Pt returned call & can be reached at [phone].  Breanna Dixon

## 2014-01-13 ENCOUNTER — Ambulatory Visit: Payer: Medicare Other | Admitting: Internal Medicine

## 2014-02-15 ENCOUNTER — Other Ambulatory Visit (HOSPITAL_COMMUNITY): Payer: Self-pay | Admitting: Psychiatry

## 2014-02-15 ENCOUNTER — Encounter (INDEPENDENT_AMBULATORY_CARE_PROVIDER_SITE_OTHER): Payer: Self-pay

## 2014-02-15 ENCOUNTER — Ambulatory Visit (HOSPITAL_COMMUNITY)
Admission: RE | Admit: 2014-02-15 | Discharge: 2014-02-15 | Disposition: A | Discharge status: Home / Self Care | Payer: Medicare Other | Source: Ambulatory Visit | Attending: Psychiatry | Admitting: Psychiatry

## 2014-02-15 DIAGNOSIS — M898X1 Other specified disorders of bone, shoulder: Secondary | ICD-10-CM | POA: Diagnosis not present

## 2014-02-15 DIAGNOSIS — M25512 Pain in left shoulder: Secondary | ICD-10-CM | POA: Diagnosis not present

## 2014-02-15 DIAGNOSIS — R52 Pain, unspecified: Secondary | ICD-10-CM

## 2014-04-10 ENCOUNTER — Encounter (HOSPITAL_COMMUNITY): Payer: Self-pay | Admitting: Physical Medicine and Rehabilitation

## 2014-04-10 ENCOUNTER — Emergency Department (HOSPITAL_COMMUNITY)
Admission: EM | Admit: 2014-04-10 | Discharge: 2014-04-10 | Disposition: A | Discharge status: Home / Self Care | Payer: Medicare Other | Attending: Emergency Medicine | Admitting: Emergency Medicine

## 2014-04-10 DIAGNOSIS — Z8701 Personal history of pneumonia (recurrent): Secondary | ICD-10-CM | POA: Diagnosis not present

## 2014-04-10 DIAGNOSIS — S0501XA Injury of conjunctiva and corneal abrasion without foreign body, right eye, initial encounter: Secondary | ICD-10-CM | POA: Insufficient documentation

## 2014-04-10 DIAGNOSIS — Z87442 Personal history of urinary calculi: Secondary | ICD-10-CM | POA: Insufficient documentation

## 2014-04-10 DIAGNOSIS — Y9389 Activity, other specified: Secondary | ICD-10-CM | POA: Insufficient documentation

## 2014-04-10 DIAGNOSIS — G43909 Migraine, unspecified, not intractable, without status migrainosus: Secondary | ICD-10-CM | POA: Insufficient documentation

## 2014-04-10 DIAGNOSIS — Z86711 Personal history of pulmonary embolism: Secondary | ICD-10-CM | POA: Diagnosis not present

## 2014-04-10 DIAGNOSIS — M797 Fibromyalgia: Secondary | ICD-10-CM | POA: Insufficient documentation

## 2014-04-10 DIAGNOSIS — S0591XA Unspecified injury of right eye and orbit, initial encounter: Secondary | ICD-10-CM | POA: Diagnosis present

## 2014-04-10 DIAGNOSIS — Y998 Other external cause status: Secondary | ICD-10-CM | POA: Diagnosis not present

## 2014-04-10 DIAGNOSIS — Z7951 Long term (current) use of inhaled steroids: Secondary | ICD-10-CM | POA: Insufficient documentation

## 2014-04-10 DIAGNOSIS — Y92008 Other place in unspecified non-institutional (private) residence as the place of occurrence of the external cause: Secondary | ICD-10-CM | POA: Insufficient documentation

## 2014-04-10 DIAGNOSIS — F99 Mental disorder, not otherwise specified: Secondary | ICD-10-CM | POA: Diagnosis not present

## 2014-04-10 DIAGNOSIS — Z853 Personal history of malignant neoplasm of breast: Secondary | ICD-10-CM | POA: Insufficient documentation

## 2014-04-10 DIAGNOSIS — M1990 Unspecified osteoarthritis, unspecified site: Secondary | ICD-10-CM | POA: Insufficient documentation

## 2014-04-10 DIAGNOSIS — Z8709 Personal history of other diseases of the respiratory system: Secondary | ICD-10-CM | POA: Insufficient documentation

## 2014-04-10 DIAGNOSIS — W228XXA Striking against or struck by other objects, initial encounter: Secondary | ICD-10-CM | POA: Insufficient documentation

## 2014-04-10 DIAGNOSIS — Z79899 Other long term (current) drug therapy: Secondary | ICD-10-CM | POA: Insufficient documentation

## 2014-04-10 DIAGNOSIS — I10 Essential (primary) hypertension: Secondary | ICD-10-CM | POA: Diagnosis not present

## 2014-04-10 DIAGNOSIS — Z87891 Personal history of nicotine dependence: Secondary | ICD-10-CM | POA: Insufficient documentation

## 2014-04-10 MED ORDER — ERYTHROMYCIN 5 MG/GM OP OINT
1.0000 "application " | TOPICAL_OINTMENT | Freq: Once | OPHTHALMIC | Status: AC
  Administered 2014-04-10: 1 via OPHTHALMIC
  Filled 2014-04-10: qty 3.5

## 2014-04-10 MED ORDER — TETRACAINE HCL 0.5 % OP SOLN
2.0000 [drp] | Freq: Once | OPHTHALMIC | Status: AC
  Administered 2014-04-10: 2 [drp] via OPHTHALMIC
  Filled 2014-04-10: qty 2

## 2014-04-10 MED ORDER — FLUORESCEIN SODIUM 1 MG OP STRP
1.0000 | ORAL_STRIP | Freq: Once | OPHTHALMIC | Status: AC
  Administered 2014-04-10: 1 via OPHTHALMIC
  Filled 2014-04-10: qty 1

## 2014-04-10 NOTE — ED Provider Notes (Signed)
CSN: [id number]     Arrival date & time 04/10/14  1421 History  This chart was scribed for Breanna Docker, NP, working with Breanna Pollack, MD by Breanna Dixon, ED Scribe. The patient was seen in room TR04C/TR04C at 3:11 PM.    Chief Complaint  Patient presents with  . Eye Pain       The history is provided by the patient. No language interpreter was used.    HPI Comments: Breanna Dixon is a 69 y.o. female who presents to the Emergency Department complaining of right eye pain onset 1.5 hours ago PTA. Pt playing with grandchild when velcro from the child's pants hit the pt in the eye. She states that she is having associated symptoms of eye redness, eye irritation, blurred vision in right eye, HA. She denies eye discharge and any other symptoms. Pt wears glasses and contacts. Pt wasn't wearing either glasses or contacts. Denies being allergic to medications.   Past Medical History  Diagnosis Date  . Complication of anesthesia     woke up during previous surgery  . Cancer     breast hx  . Dysrhythmia     tachycardia  . Hypertension     take verapamil and toprol daily  . Bronchitis   . Seasonal allergies     flonase prn  . Blood transfusion 1974  . Ulcer, gastric, acute     hx of  . Fibromyalgia   . Mental disorder     hx of ptsd   . Lung mass April 2013    Right lower lobe lung mass  . Pneumonia     hx of.  5 x in life time.  [name] Head injury, closed, with concussion   . Kidney stone     hx of  . Headache(784.0)     migranes, last one was in Jun 12, 2011  . Arthritis   . Hypertension   . Pulmonary embolism 2006    IVC filter  . Systemic lupus erythematosus     Cutaneous- Plaquenil   Past Surgical History  Procedure Laterality Date  . Cholecystectomy  1994  . Back surgery  2000/2010    fusion  . Tonsillectomy  1967  . Appendectomy  1965  . Cystoscopy      multiple  . Urethral dilation  1971  . Breast surgery      bilateral partial masectomy  . Abdominal  hysterectomy  1992  . Dilation and curettage of uterus    . Hernia repair    . Colonoscopy    . Knee arthroscopy    . Total knee arthroplasty  06/04/2011    Procedure: TOTAL KNEE ARTHROPLASTY;  Surgeon: Rudean Haskell, MD;  Location: [facility];  Service: Orthopedics;  Laterality: Right;  . Vena cava filter placement  2006  . Incisional hernia repair    . Multiple tooth extractions    . Joint replacement    . Total knee arthroplasty  10/15/2011    Procedure: TOTAL KNEE ARTHROPLASTY;  Surgeon: Rudean Haskell, MD;  Location: [facility];  Service: Orthopedics;  Laterality: Left;  . Ivc filter     Family History  Problem Relation Age of Onset  . Adopted: Yes  . Anesthesia problems Neg Hx   . Hypotension Neg Hx   . Malignant hyperthermia Neg Hx   . Pseudochol deficiency Neg Hx    History  Substance Use Topics  . Smoking status: Former Smoker -- 0.75 packs/day for 7 years  Types: Cigarettes    Quit date: 01/03/2012  . Smokeless tobacco: Never Used  . Alcohol Use: No   OB History    No data available     Review of Systems  Eyes: Positive for pain, redness and visual disturbance. Negative for discharge.  Neurological: Positive for headaches.      Allergies  Imitrex; Ketorolac tromethamine; Stadol; Ivp dye; and Shellfish allergy  Home Medications   Prior to Admission medications   Medication Sig Start Date End Date Taking? Authorizing Provider  albuterol-ipratropium (COMBIVENT) 18-103 MCG/ACT inhaler Inhale 2 puffs into the lungs every 6 (six) hours as needed. For shortness of breath 07/14/13  Yes Deneise Lever, MD  carbamazepine (TEGRETOL XR) 200 MG 12 hr tablet Take 200-400 mg by mouth 2 (two) times daily. 200mg  in am and 400mg  at bedtime   Yes Historical Provider, MD  clobetasol cream (TEMOVATE) 1.47 % Apply 1 application topically 2 (two) times daily.  07/07/13  Yes Historical Provider, MD  clotrimazole-betamethasone (LOTRISONE) cream Apply 1 application topically 2 (two) times  daily. 2 times a day as needed   Yes Historical Provider, MD  DULoxetine (CYMBALTA) 60 MG capsule Take 120 mg by mouth daily.   Yes Historical Provider, MD  fenofibrate micronized (LOFIBRA) 134 MG capsule Take 134 mg by mouth daily before breakfast.   Yes Historical Provider, MD  fluticasone (FLONASE) 50 MCG/ACT nasal spray Place 2 sprays into the nose 2 (two) times daily.    Yes Historical Provider, MD  furosemide (LASIX) 20 MG tablet Take 1 tablet (20 mg total) by mouth daily. 01/12/12  Yes Oswald Hillock, MD  hydroxychloroquine (PLAQUENIL) 200 MG tablet Take 1 tablet by mouth 2 (two) times daily. 06/03/12  Yes Historical Provider, MD  hydrOXYzine (ATARAX/VISTARIL) 25 MG tablet Take 25 mg by mouth 3 (three) times daily as needed for itching (insomnia).    Yes Historical Provider, MD  ibuprofen (ADVIL,MOTRIN) 800 MG tablet Take 800 mg by mouth every 8 (eight) hours as needed for pain.   Yes Historical Provider, MD  LORazepam (ATIVAN) 2 MG tablet Take 1 mg by mouth 3 (three) times daily.   Yes Historical Provider, MD  methocarbamol (ROBAXIN) 500 MG tablet Take 500 mg by mouth 3 (three) times daily as needed (For restless legs syndrome).    Yes Historical Provider, MD  methocarbamol (ROBAXIN) 500 MG tablet Take 500 mg by mouth every 6 (six) hours as needed for muscle spasms. take 1 tablet by mouth three times a day 07/09/13  Yes Historical Provider, MD  metoprolol succinate (TOPROL-XL) 100 MG 24 hr tablet Take 1 tablet (100 mg total) by mouth daily. Take with or immediately following a meal. 01/12/12  Yes Oswald Hillock, MD  pregabalin (LYRICA) 75 MG capsule Take 75 mg by mouth 3 (three) times daily.    Yes Historical Provider, MD  verapamil (COVERA HS) 240 MG (CO) 24 hr tablet Take 240 mg by mouth at bedtime.   Yes Historical Provider, MD  Vitamin D, Ergocalciferol, (DRISDOL) 50000 UNITS CAPS capsule Take by mouth. 06/15/13  Yes Historical Provider, MD  tiotropium (SPIRIVA) 18 MCG inhalation capsule Place 1  capsule (18 mcg total) into inhaler and inhale daily. Patient not taking: Reported on 04/10/2014 01/12/12   Oswald Hillock, MD   BP 114/55 mmHg  Pulse 69  Temp(Src) 98.1 F (36.7 C) (Oral)  Resp 18  Ht 5\' 7"  (1.702 m)  Wt 230 lb (104.327 kg)  BMI 36.01 kg/m2  SpO2  95%  LMP 06/04/2011  Physical Exam  Constitutional: She is oriented to person, place, and time. She appears well-developed and well-nourished. No distress.  HENT:  Head: Normocephalic and atraumatic.  Eyes: EOM are normal.  Slit lamp exam:      The right eye shows corneal abrasion and fluorescein uptake.    Neck: Neck supple. No tracheal deviation present.  Cardiovascular: Normal rate.   Pulmonary/Chest: Effort normal. No respiratory distress.  Musculoskeletal: Normal range of motion.  Neurological: She is alert and oriented to person, place, and time.  Skin: Skin is warm and dry.  Psychiatric: She has a normal mood and affect. Her behavior is normal.  Nursing note and vitals reviewed.   ED Course  Procedures (including critical care time) DIAGNOSTIC STUDIES: Oxygen Saturation is 95% on room air, adequate by my interpretation.    COORDINATION OF CARE: 3:20 PM-Discussed treatment plan which includes abx eye drops with pt at bedside and pt agreed to plan.   Labs Review Labs Reviewed - No data to display  Imaging Review No results found.   EKG Interpretation None      MDM   Final diagnoses:  Corneal abrasion, right, initial encounter    Exam consistent with abrasion. Pt given erythromycin for pain. Given opthalmology follow up and return precaution.   I personally performed the services described in this documentation, which was scribed in my presence. The recorded information has been reviewed and is accurate.    Breanna Docker, NP 04/10/14 Garland Ray, MD 04/11/14 [phone]

## 2014-04-10 NOTE — ED Notes (Signed)
Visual acuity with glasses

## 2014-04-10 NOTE — Discharge Instructions (Signed)
Follow up for continued or worsening symptoms Corneal Abrasion The cornea is the clear covering at the front and center of the eye. When looking at the colored portion of the eye (iris), you are looking through the cornea. This very thin tissue is made up of many layers. The surface layer is a single layer of cells (corneal epithelium) and is one of the most sensitive tissues in the body. If a scratch or injury causes the corneal epithelium to come off, it is called a corneal abrasion. If the injury extends to the tissues below the epithelium, the condition is called a corneal ulcer. CAUSES   Scratches.  Trauma.  Foreign body in the eye. Some people have recurrences of abrasions in the area of the original injury even after it has healed (recurrent erosion syndrome). Recurrent erosion syndrome generally improves and goes away with time. SYMPTOMS   Eye pain.  Difficulty or inability to keep the injured eye open.  The eye becomes very sensitive to light.  Recurrent erosions tend to happen suddenly, first thing in the morning, usually after waking up and opening the eye. DIAGNOSIS  Your health care provider can diagnose a corneal abrasion during an eye exam. Dye is usually placed in the eye using a drop or a small paper strip moistened by your tears. When the eye is examined with a special light, the abrasion shows up clearly because of the dye. TREATMENT   Small abrasions may be treated with antibiotic drops or ointment alone.  A pressure patch may be put over the eye. If this is done, follow your doctor's instructions for when to remove the patch. Do not drive or use machines while the eye patch is on. Judging distances is hard to do with a patch on. If the abrasion becomes infected and spreads to the deeper tissues of the cornea, a corneal ulcer can result. This is serious because it can cause corneal scarring. Corneal scars interfere with light passing through the cornea and cause a loss  of vision in the involved eye. HOME CARE INSTRUCTIONS  Use medicine or ointment as directed. Only take over-the-counter or prescription medicines for pain, discomfort, or fever as directed by your health care provider.  Do not drive or operate machinery if your eye is patched. Your ability to judge distances is impaired.  If your health care provider has given you a follow-up appointment, it is very important to keep that appointment. Not keeping the appointment could result in a severe eye infection or permanent loss of vision. If there is any problem keeping the appointment, let your health care provider know. SEEK MEDICAL CARE IF:   You have pain, light sensitivity, and a scratchy feeling in one eye or both eyes.  Your pressure patch keeps loosening up, and you can blink your eye under the patch after treatment.  Any kind of discharge develops from the eye after treatment or if the lids stick together in the morning.  You have the same symptoms in the morning as you did with the original abrasion days, weeks, or months after the abrasion healed. MAKE SURE YOU:   Understand these instructions.  Will watch your condition.  Will get help right away if you are not doing well or get worse. Document Released: 01/20/2000 Document Revised: 01/27/2013 Document Reviewed: 09/29/2012 [facility] Patient Information 2015 [place], [place]. This information is not intended to replace advice given to you by your health care provider. Make sure you discuss any questions you have with  your health care provider.

## 2014-04-10 NOTE — ED Notes (Signed)
Pt presents to department for evaluation of R eye. Was playing with grandchild when velcro from clothes struck R eye. Now states R eye redness and irritation. Also states blurred vision from R eye. Pt is alert and oriented x4. NAD.

## 2014-04-10 NOTE — ED Notes (Signed)
Visual acuity done without

## 2014-04-10 NOTE — ED Notes (Signed)
Declined W/C at D/C and was escorted to lobby by RN. 

## 2014-04-16 ENCOUNTER — Telehealth: Payer: Self-pay | Admitting: Internal Medicine

## 2014-04-16 NOTE — Telephone Encounter (Signed)
Called pt and appt scheduled for 3/28. Nothing further needed

## 2014-04-16 NOTE — Telephone Encounter (Signed)
ATC received fast busy signal x 3 WCB

## 2014-04-20 ENCOUNTER — Other Ambulatory Visit: Payer: Self-pay | Admitting: Orthopedic Surgery

## 2014-04-20 DIAGNOSIS — M25512 Pain in left shoulder: Secondary | ICD-10-CM

## 2014-05-03 ENCOUNTER — Encounter: Payer: Self-pay | Admitting: Internal Medicine

## 2014-05-03 ENCOUNTER — Ambulatory Visit (INDEPENDENT_AMBULATORY_CARE_PROVIDER_SITE_OTHER): Payer: Medicare Other | Admitting: Internal Medicine

## 2014-05-03 ENCOUNTER — Ambulatory Visit (INDEPENDENT_AMBULATORY_CARE_PROVIDER_SITE_OTHER)
Admission: RE | Admit: 2014-05-03 | Discharge: 2014-05-03 | Disposition: A | Discharge status: Home / Self Care | Payer: Medicare Other | Source: Ambulatory Visit | Attending: Internal Medicine | Admitting: Internal Medicine

## 2014-05-03 VITALS — BP 114/76 | HR 68 | Ht 66.0 in | Wt 231.4 lb

## 2014-05-03 DIAGNOSIS — J432 Centrilobular emphysema: Secondary | ICD-10-CM

## 2014-05-03 DIAGNOSIS — R911 Solitary pulmonary nodule: Secondary | ICD-10-CM | POA: Diagnosis not present

## 2014-05-03 DIAGNOSIS — Z720 Tobacco use: Secondary | ICD-10-CM

## 2014-05-03 MED ORDER — FLUTICASONE FUROATE-VILANTEROL 100-25 MCG/INH IN AEPB: INHALATION_SPRAY | RESPIRATORY_TRACT | Status: DC

## 2014-05-03 NOTE — Progress Notes (Signed)
01/15/12- 53 yoF heavy smoker recently hosp for exacerbation of COPD and now referred for pulmonary management. Hospitalized 11/29 through 01/12/2012 with discharge diagnoses of acute respiratory failure with hypoxia, COPD, tobacco abuse.. She had moved to this area from the [place] in 2009. Since here she has had more bronchitis with season change. Recent hospital for typical exacerbation. Now in physical therapy for rehabilitation after bilateral total knee replacements (05/15/2011, 10/14/2011). She has smoked one pack per day since 1967 and states that she stopped smoking November 28, using patches. Breathing is better now than usual from prehospital baseline. She never has had a cough. Activity has been limited by her knees but she thinks she has less dyspnea on exertion now. She is not needing her Combivent but continues Spiriva. A right lower lobe nodule was found April 2013. CT chest 12/21/2011 described as stable well-circumscribed right lower lobe nodule and recommended PET. Noted diffuse old granulomatous disease with calcification including calcified mediastinal and hilar nodes, granulomas in spleen and calcified nodes in the upper abdomen. No history of TB exposure. Pulmonary embolism 2006/IVC filter. Negative VQ scan 10/21/2011. She reports having a PET scan through [facility] in [place] this summer. We need to verify that and get that report.  Treated for hypertension but denies heart disease. 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 Adopted. CT 12/21/11-reviewed IMPRESSION:  1. Stable well circumscribed nodule within the right lower lobe.  Consider PET CT for metabolic activity if indicated clinically.  2. Diffuse changes of prior granulomatous disease with calcified  left lower lobe granulomas, mediastinal and hilar calcified nodes,  calcified splenic granuloma, and calcified nodes in the upper  abdomen.  Original Report Authenticated By: Ivar Drape,  M.D.   05/16/12- 12 yoF former heavy smoker followed for  COPD, lung nodule RLL  FOLLOWS FOR: pt states breathing is doing good overall(used Rescue inhaler once since hospital stay); review PFT and mammogram results with patient. States she quit smoking cigarettes in November, 2013. Had CT chest 12/21/2011 and PET scan at [facility],  her pulmonologist at [facility] retired. Now denies shortness of breath, cough, phlegm wheeze. Using Spiriva daily. History of bilateral lumpectomy. Mammograms were negative 01/23/2012.  CXR 01/04/12 IMPRESSION:  Coarse lung markings most likely represent chronic changes but  difficult to exclude mild edema. There is no focal airspace  disease.  Nodule in the right lower lung. This was better characterized on  the chest CT from 12/21/2011.  Original Report Authenticated By: Markus Daft, M.D. PFT 05/16/2012: Mild obstructive airways disease with insignificant response to bronchodilator, normal lung volumes, diffusion mildly reduced. FVC 3.08/96%, FEV1 2.01/86%, FEV1/FEC 0.65. TLC 95%, DLCO 73%.  07/07/12- 70 yoF former heavy smoker followed for  COPD, lung nodule RLL , Hx PE/ IVC filter FOLLOWS FOR: Denies any SOB, wheezing, cough, or congestion; overall breathing is doing good per patient. Tells me in past year she has had TKR both knees and lumbar spine sgy. She gives additional medical hx of PE x2 w/ IVC filter 2006. 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 Fibromyalgia. 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 Says breathing "fine". Discussed f/u nodules.  01/06/13- 76 yoF former heavy smoker followed for  COPD, lung nodule RLL , Hx PE/ IVC filter FOLLOWS FOR:at primary md 1 wk. ago had rales,sob-worse,chest pain and tightness,cough-yellow x 2 wks.,wheezing ,fcs  Says she felt this way last year in  the fall. Recent chest congestion, cough with clear or trace yellow, tight,  back pain off and on, some low-grade fever and sweats. Using rescue inhaler for the first time in a year. Not smoking. CT chest 08/13/12 IMPRESSION:  Stable 1.9 cm smoothly marginated ovoid lesion in the right lower  lobe. IV contrast was administered today and this does not appear  to represent an AV malformation. The nodule also shows no evidence (IR did not do needle bx.) of contrast enhancement. Given all of these factors as well as the  presence of multiple abutting blood vessels which may make the  biopsy of higher risk, decision was made to not perform a  percutaneous biopsy today and follow the lesion by imaging.  Findings were discussed directly with Dr. Annamaria Boots.  Original Report Authenticated By: Aletta Edouard, M.D. PET elsewhere 06/29/11- IVC filter, old granulomatous disease. RLL nodule 18/16 mm, SUV 1.4 PET 07/16/12 IMPRESSION:  1.9 x 1.8 cm right lower lobe pulmonary nodule, with suspected slow  interval growth and associated vague hypermetabolism, max SUV 2.5.  While indeterminate, the overall appearance is worrisome for  primary bronchogenic neoplasm. Consider bronchoscopy or  percutaneous biopsy for further evaluation.  No evidence of metastatic disease.  Original Report Authenticated By: Julian Hy, M.D  07/14/13- 45 yoF former heavy smoker followed for  COPD, lung nodule RLL , Hx PE/ IVC filter, hx discoid lupus rash FOLLOWS FOR:No trouble with breathing as long as she is not in pain. Per note last visit, IR did not bx nodule. Moved to old home with many steps. Using Combivent less than once daily while continuing Spiriva. Discoid lupus rash is worse in heat CT 07/14/13 IMPRESSION:  Old granulomatous disease.  Stable 3 mm RIGHT upper lobe noncalcified pulmonary nodule.  Stable lobulated RIGHT lower lobe mass ; due to growth on prior exam  and hypermetabolism on PET-CT, recommend continued surveillance to  exclude malignancy.  No new intrathoracic abnormalities.   Electronically Signed  By: Lavonia Dana M.D.  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 Tried Spiriva but couldn't manage the dispenser well. She relates dyspnea on exertion to obesity and deconditioning, noticed mainly on stairs.  ROS-see HPI Constitutional:   No-   weight loss, night sweats, +fevers, chills, fatigue, lassitude. HEENT:   No-  headaches, difficulty swallowing, tooth/dental problems, sore throat,       No-  sneezing, itching, ear ache, nasal congestion, post nasal drip,  CV:  No-   chest pain, orthopnea, PND, swelling in lower extremities, anasarca,  dizziness, palpitations Resp: +shortness of breath with exertion or at rest.              +cough,  No non-productive cough,  No- coughing up of blood.              No-   change in color of mucus.  No- wheezing.   Skin: + rash or lesions. GI:  No-   heartburn, indigestion, abdominal pain, nausea, vomiting,  GU: . MS:  No-   joint pain or swelling. Neuro-     nothing unusual Psych:  No- change in mood or affect. No depression or anxiety.  No memory loss.  OBJ- Physical  Exam General- Alert, Oriented, Affect-appropriate, Distress- none acute, +obese Skin- + rash dorsum left hand reported discoid lupus Lymphadenopathy- none Head- atraumatic            Eyes- Gross vision intact, PERRLA, conjunctivae and secretions clear            Ears- Hearing, canals-normal            Nose- Clear, no-Septal dev, mucus, polyps, erosion, perforation             Throat- Mallampati II , mucosa clear , drainage- none, tonsils- atrophic. Dentures Neck- flexible , trachea midline, no stridor , thyroid nl, carotid no bruit Chest - symmetrical  excursion , unlabored           Heart/CV- RRR , no murmur , no gallop  , no rub, nl s1 s2                           - JVD- none , edema- none, stasis changes- none, varices- none           Lung- clear to P&A, wheeze- none, cough- none , dullness-none, rub- none           Chest wall-  Abd-  Br/ Gen/ Rectal- Not done, not indicated Extrem- cyanosis- none, clubbing, none, atrophy- none, strength- nl. + bilateral TKR scars, Cane Neuro- grossly intact to observation

## 2014-05-03 NOTE — Patient Instructions (Addendum)
Sample and script Breo 100 maintenance inhaler    1 puff then rinse mouth, once daily  Ok to still use the Combivent rescue inhaler up to every 6 hours, if needed  Order- CXR   Dx lung nodules   2015 Flu vax if available

## 2014-05-04 ENCOUNTER — Other Ambulatory Visit: Payer: Self-pay | Admitting: Internal Medicine

## 2014-05-04 DIAGNOSIS — R918 Other nonspecific abnormal finding of lung field: Secondary | ICD-10-CM

## 2014-05-04 NOTE — Progress Notes (Signed)
Quick Note:  lmtcb for pt. ______ 

## 2014-05-04 NOTE — Assessment & Plan Note (Signed)
Most recent chest CT showed stable nodules since the last check, but with growth previously. We both agree follow-up is important. Plan-chest x-ray

## 2014-05-04 NOTE — Assessment & Plan Note (Signed)
She didn't like the Spiriva HandiHaler. We can consider a trial with Respimat device later Plan-emphasis on smoking cessation. Sample Breo for trial, chest x-ray

## 2014-05-04 NOTE — Assessment & Plan Note (Signed)
Remains off of cigarettes.

## 2014-05-05 ENCOUNTER — Other Ambulatory Visit: Payer: Medicare Other

## 2014-05-05 ENCOUNTER — Other Ambulatory Visit (INDEPENDENT_AMBULATORY_CARE_PROVIDER_SITE_OTHER): Payer: Medicare Other

## 2014-05-05 ENCOUNTER — Ambulatory Visit (INDEPENDENT_AMBULATORY_CARE_PROVIDER_SITE_OTHER)
Admission: RE | Admit: 2014-05-05 | Discharge: 2014-05-05 | Disposition: A | Discharge status: Home / Self Care | Payer: Medicare Other | Source: Ambulatory Visit | Attending: Internal Medicine | Admitting: Internal Medicine

## 2014-05-05 DIAGNOSIS — R911 Solitary pulmonary nodule: Secondary | ICD-10-CM | POA: Diagnosis not present

## 2014-05-05 DIAGNOSIS — R918 Other nonspecific abnormal finding of lung field: Secondary | ICD-10-CM

## 2014-05-05 LAB — BASIC METABOLIC PANEL
BUN: 14 mg/dL (ref 6–23)
CHLORIDE: 103 meq/L (ref 96–112)
CO2: 28 meq/L (ref 19–32)
CREATININE: 0.9 mg/dL (ref 0.40–1.20)
Calcium: 9.4 mg/dL (ref 8.4–10.5)
GFR: 66.44 mL/min (ref >60.00)
GLUCOSE: 136 mg/dL — AB (ref 70–99)
Potassium: 3.7 mEq/L (ref 3.5–5.1)
SODIUM: 139 meq/L (ref 135–145)

## 2014-05-07 ENCOUNTER — Other Ambulatory Visit: Payer: Medicare Other

## 2014-05-14 ENCOUNTER — Telehealth: Payer: Self-pay | Admitting: Internal Medicine

## 2014-05-14 ENCOUNTER — Ambulatory Visit (HOSPITAL_COMMUNITY)
Admission: RE | Admit: 2014-05-14 | Discharge: 2014-05-14 | Disposition: A | Discharge status: Home / Self Care | Payer: Medicare Other | Source: Ambulatory Visit | Attending: Internal Medicine | Admitting: Internal Medicine

## 2014-05-14 DIAGNOSIS — R918 Other nonspecific abnormal finding of lung field: Secondary | ICD-10-CM | POA: Diagnosis present

## 2014-05-14 MED ORDER — FLUDEOXYGLUCOSE F - 18 (FDG) INJECTION
11.4200 | Freq: Once | INTRAVENOUS | Status: AC | PRN
  Administered 2014-05-14: 11.42 via INTRAVENOUS

## 2014-05-14 NOTE — Telephone Encounter (Signed)
I called her about PET results in conjunction with earlier CT. I have asked one of my associates to review the images with consideration of a guided bronchoscopy, and await his response. She is in agreement, and indicates desire to proceed aggressively. She is to call me back if she doesn't hear from [place] by the first of the week.

## 2014-05-17 LAB — GLUCOSE, CAPILLARY: Glucose-Capillary: 130 mg/dL — ABNORMAL HIGH (ref 70–99)

## 2014-05-18 ENCOUNTER — Telehealth: Payer: Self-pay | Admitting: Internal Medicine

## 2014-05-18 DIAGNOSIS — J8410 Pulmonary fibrosis, unspecified: Secondary | ICD-10-CM

## 2014-05-18 NOTE — Telephone Encounter (Signed)
Dr Lamonte Sakai agreed to contact her about scheduling this. Please contact him and see what he needs to go forward.

## 2014-05-18 NOTE — Telephone Encounter (Signed)
RB please advise. Thanks.  

## 2014-05-18 NOTE — Telephone Encounter (Signed)
Pt calling for update on bronch scheduling.  Please advise.  called her about PET results in conjunction with earlier CT. I have asked one of my associates to review the images with consideration of a guided bronchoscopy, and await his response. She is in agreement, and indicates desire to proceed aggressively. She is to call me back if she doesn't hear from [place] by the first of the week.

## 2014-05-18 NOTE — Telephone Encounter (Signed)
Per RB >> order will be placed for ENB.  Pt should hear from [facility] about getting this appointment set up. Pt is aware that this order has been placed and will await Libby's call.

## 2014-05-27 ENCOUNTER — Encounter (HOSPITAL_COMMUNITY)
Admission: RE | Admit: 2014-05-27 | Discharge: 2014-05-27 | Disposition: A | Discharge status: Home / Self Care | Payer: Medicare Other | Source: Ambulatory Visit | Attending: Emergency Medicine | Admitting: Emergency Medicine

## 2014-05-27 ENCOUNTER — Encounter (HOSPITAL_COMMUNITY): Payer: Self-pay

## 2014-05-27 ENCOUNTER — Other Ambulatory Visit: Payer: Self-pay

## 2014-05-27 DIAGNOSIS — I10 Essential (primary) hypertension: Secondary | ICD-10-CM | POA: Diagnosis not present

## 2014-05-27 DIAGNOSIS — Z01812 Encounter for preprocedural laboratory examination: Secondary | ICD-10-CM | POA: Insufficient documentation

## 2014-05-27 DIAGNOSIS — Z86711 Personal history of pulmonary embolism: Secondary | ICD-10-CM | POA: Insufficient documentation

## 2014-05-27 DIAGNOSIS — Z01810 Encounter for preprocedural cardiovascular examination: Secondary | ICD-10-CM | POA: Diagnosis not present

## 2014-05-27 DIAGNOSIS — J449 Chronic obstructive pulmonary disease, unspecified: Secondary | ICD-10-CM | POA: Diagnosis not present

## 2014-05-27 DIAGNOSIS — E785 Hyperlipidemia, unspecified: Secondary | ICD-10-CM | POA: Diagnosis not present

## 2014-05-27 DIAGNOSIS — Z96653 Presence of artificial knee joint, bilateral: Secondary | ICD-10-CM | POA: Insufficient documentation

## 2014-05-27 DIAGNOSIS — R918 Other nonspecific abnormal finding of lung field: Secondary | ICD-10-CM | POA: Diagnosis not present

## 2014-05-27 DIAGNOSIS — Z87891 Personal history of nicotine dependence: Secondary | ICD-10-CM | POA: Insufficient documentation

## 2014-05-27 DIAGNOSIS — Z79899 Other long term (current) drug therapy: Secondary | ICD-10-CM | POA: Insufficient documentation

## 2014-05-27 DIAGNOSIS — Z01818 Encounter for other preprocedural examination: Secondary | ICD-10-CM | POA: Diagnosis not present

## 2014-05-27 HISTORY — DX: Reserved for concepts with insufficient information to code with codable children: IMO0002

## 2014-05-27 HISTORY — DX: Post-traumatic stress disorder, unspecified: F43.10

## 2014-05-27 HISTORY — DX: Personal history of colon polyps, unspecified: Z86.0100

## 2014-05-27 HISTORY — DX: Systemic lupus erythematosus, unspecified: M32.9

## 2014-05-27 HISTORY — DX: Personal history of other specified conditions: Z87.898

## 2014-05-27 HISTORY — DX: Chronic obstructive pulmonary disease, unspecified: J44.9

## 2014-05-27 HISTORY — DX: Dizziness and giddiness: R42

## 2014-05-27 HISTORY — DX: Personal history of colonic polyps: Z86.010

## 2014-05-27 HISTORY — DX: Pain in unspecified joint: M25.50

## 2014-05-27 HISTORY — DX: Anxiety disorder, unspecified: F41.9

## 2014-05-27 HISTORY — DX: Personal history of other diseases of the respiratory system: Z87.09

## 2014-05-27 HISTORY — DX: Personal history of other medical treatment: Z92.89

## 2014-05-27 HISTORY — DX: Hyperlipidemia, unspecified: E78.5

## 2014-05-27 LAB — COMPREHENSIVE METABOLIC PANEL
ALK PHOS: 72 U/L (ref 39–117)
ALT: 18 U/L (ref 0–35)
AST: 24 U/L (ref 0–37)
Albumin: 4.1 g/dL (ref 3.5–5.2)
Anion gap: 10 (ref 5–15)
BILIRUBIN TOTAL: 0.6 mg/dL (ref 0.3–1.2)
BUN: 13 mg/dL (ref 6–23)
CO2: 23 mmol/L (ref 19–32)
Calcium: 9.1 mg/dL (ref 8.4–10.5)
Chloride: 105 mmol/L (ref 96–112)
Creatinine, Ser: 0.84 mg/dL (ref 0.50–1.10)
GFR calc Af Amer: 82 mL/min — ABNORMAL LOW (ref >90)
GFR, EST NON AFRICAN AMERICAN: 71 mL/min — AB (ref >90)
GLUCOSE: 101 mg/dL — AB (ref 70–99)
Potassium: 4.3 mmol/L (ref 3.5–5.1)
SODIUM: 138 mmol/L (ref 135–145)
Total Protein: 7.4 g/dL (ref 6.0–8.3)

## 2014-05-27 LAB — CBC
HEMATOCRIT: 39 % (ref 36.0–46.0)
Hemoglobin: 12.1 g/dL (ref 12.0–15.0)
MCH: 27 pg (ref 26.0–34.0)
MCHC: 31 g/dL (ref 30.0–36.0)
MCV: 87.1 fL (ref 78.0–100.0)
Platelets: 249 10*3/uL (ref 150–400)
RBC: 4.48 MIL/uL (ref 3.87–5.11)
RDW: 12.7 % (ref 11.5–15.5)
WBC: 6.1 10*3/uL (ref 4.0–10.5)

## 2014-05-27 LAB — PROTIME-INR
INR: 1.07 (ref 0.00–1.49)
Prothrombin Time: 14 seconds (ref 11.6–15.2)

## 2014-05-27 LAB — APTT: aPTT: 28 seconds (ref 24–37)

## 2014-05-27 NOTE — Progress Notes (Addendum)
Pt doesn't have a cardiologist  Medical Md is Dr.Willett  Echo report in epic from 2013  Stress test done in April 2013 and to be requested from [place]  Denies ever having a heart cath  CXR in epic from 05-04-14  EKG > 1 yr ago

## 2014-05-27 NOTE — Pre-Procedure Instructions (Signed)
Breanna Dixon  05/27/2014   Your procedure is scheduled on:  Wed, April 27 @ 10:30 AM  Report to [facility] and go to Admitting at 8:30 AM.  Call this number if you have problems the morning of surgery: [phone]   Remember:   Do not eat food or drink liquids after midnight.   Take these medicines the morning of surgery with A SIP OF WATER: Albuterol-Ipratropium(Combivent)<Bring Your Inhaler With You>,Tegretol(Carbamazepine),Cymbalta(Duloxetine),Flonase(Fluticasone),Ativan(Lorazepam),Metoprolol(Toprol),and Lyrica(Pregabalin)             No Goody's,BC's,Aleve,Aspirin,Ibuprofen,Fish Oil,or any Herbal Medications.       Do not wear jewelry, make-up or nail polish.  Do not wear lotions, powders, or perfumes. You may wear deodorant.  Do not shave 48 hours prior to surgery.   Do not bring valuables to the hospital.  [facility] is not responsible                  for any belongings or valuables.               Contacts, dentures or bridgework may not be worn into surgery.  Leave suitcase in the car. After surgery it may be brought to your room.  For patients admitted to the hospital, discharge time is determined by your                treatment team.               Patients discharged the day of surgery will not be allowed to drive  home.    Special Instructions:  [place] - Preparing for Surgery  Before surgery, you can play an important role.  Because skin is not sterile, your skin needs to be as free of germs as possible.  You can reduce the number of germs on you skin by washing with CHG (chlorahexidine gluconate) soap before surgery.  CHG is an antiseptic cleaner which kills germs and bonds with the skin to continue killing germs even after washing.  Please DO NOT use if you have an allergy to CHG or antibacterial soaps.  If your skin becomes reddened/irritated stop using the CHG and inform your nurse when you arrive at Short Stay.  Do not shave (including legs and  underarms) for at least 48 hours prior to the first CHG shower.  You may shave your face.  Please follow these instructions carefully:   1.  Shower with CHG Soap the night before surgery and the                                morning of Surgery.  2.  If you choose to wash your hair, wash your hair first as usual with your       normal shampoo.  3.  After you shampoo, rinse your hair and body thoroughly to remove the                      Shampoo.  4.  Use CHG as you would any other liquid soap.  You can apply chg directly       to the skin and wash gently with scrungie or a clean washcloth.  5.  Apply the CHG Soap to your body ONLY FROM THE NECK DOWN.        Do not use on open wounds or open sores.  Avoid contact with your eyes,  ears, mouth and genitals (private parts).  Wash genitals (private parts)       with your normal soap.  6.  Wash thoroughly, paying special attention to the area where your surgery        will be performed.  7.  Thoroughly rinse your body with warm water from the neck down.  8.  DO NOT shower/wash with your normal soap after using and rinsing off       the CHG Soap.  9.  Pat yourself dry with a clean towel.            10.  Wear clean pajamas.            11.  Place clean sheets on your bed the night of your first shower and do not        sleep with pets.  Day of Surgery  Do not apply any lotions/deoderants the morning of surgery.  Please wear clean clothes to the hospital/surgery center.     Please read over the following fact sheets that you were given: Pain Booklet, Coughing and Deep Breathing and Surgical Site Infection Prevention

## 2014-05-28 NOTE — Progress Notes (Signed)
Anesthesia Chart Review:  Pt is 69 year old female scheduled for video bronchoscopy with endobronchial navigation on for lung mass 06/02/2014 with Dr. Lamonte Sakai.   PMH includes: HTN, COPD, lupus, hyperlipidemia, PE (2006). Former smoker. BMI 35. S/p L TKA 10/15/11, s/p R TKA 06/04/11.  Medications include: tegretol, fenofibrate, lasix, plaquenil, metoprolol, lyrica, verapamil.   Preoperative labs reviewed.    Chest x-ray 05/03/2014 reviewed. Interval growth of lower lobe lesion. This could represent a primary or metastatic malignancy.   EKG: NSR.   Echo 01/11/2012: - Left ventricle: The cavity size was normal. Wall thickness was increased in a pattern of mild LVH. There was mild focal basal hypertrophy of the septum. There is some turbulence across the LV outflow tract but no definite SAM. Peak LV outflow tract gradient about 17 mmHg. Systolic function was normal. The estimated ejection fraction was in the range of 60% to 65%. Wall motion was normal; there were no regional wall motion abnormalities. Doppler parameters are consistent with abnormal left ventricular relaxation (grade 1 diastolic dysfunction). - Aortic valve: There was no stenosis. Trivial regurgitation. - Mitral valve: Moderately calcified annulus. No significant regurgitation. - Left atrium: The atrium was mildly dilated. - Right ventricle: The cavity size was normal. Systolic function was normal. - Pulmonary arteries: No complete TR doppler jet so unable to estimate PA systolic pressure. - Inferior vena cava: The vessel was normal in size; the respirophasic diameter changes were in the normal range (= 50%); findings are consistent with normal central venous pressure. Impressions: - Normal LV size with mild LV hypertrophy and focal basalseptal hypertrophy. There is turbulence across the LVOT but no mitral valve SAM noted and peak LVOT gradient only 17 mmHg. Normal RV size and systolic function. No significant valvular  abnormalities.  Nuclear stress test 04/16/2011: -No evidence of ischemia. -Normal LV function with EF of 89%  If no changes, I anticipate pt can proceed with surgery as scheduled.   Breanna Cass, FNP-BC [facility] Short Stay Surgical Center/Anesthesiology Phone: [phone] 05/28/2014 12:59 PM

## 2014-06-02 ENCOUNTER — Ambulatory Visit (HOSPITAL_COMMUNITY): Payer: Medicare Other

## 2014-06-02 ENCOUNTER — Ambulatory Visit (HOSPITAL_COMMUNITY): Payer: Medicare Other | Admitting: Emergency Medicine

## 2014-06-02 ENCOUNTER — Encounter (HOSPITAL_COMMUNITY): Payer: Self-pay | Admitting: *Deleted

## 2014-06-02 ENCOUNTER — Encounter (HOSPITAL_COMMUNITY)
Admission: RE | Disposition: A | Discharge status: Home / Self Care | Payer: Self-pay | Source: Ambulatory Visit | Attending: Emergency Medicine

## 2014-06-02 ENCOUNTER — Ambulatory Visit (HOSPITAL_COMMUNITY): Payer: Medicare Other | Admitting: Anesthesiology

## 2014-06-02 ENCOUNTER — Ambulatory Visit (HOSPITAL_COMMUNITY)
Admission: RE | Admit: 2014-06-02 | Discharge: 2014-06-02 | Disposition: A | Discharge status: Home / Self Care | Payer: Medicare Other | Source: Ambulatory Visit | Attending: Emergency Medicine | Admitting: Emergency Medicine

## 2014-06-02 DIAGNOSIS — Z86711 Personal history of pulmonary embolism: Secondary | ICD-10-CM | POA: Insufficient documentation

## 2014-06-02 DIAGNOSIS — Z6834 Body mass index (BMI) 34.0-34.9, adult: Secondary | ICD-10-CM | POA: Diagnosis not present

## 2014-06-02 DIAGNOSIS — J449 Chronic obstructive pulmonary disease, unspecified: Secondary | ICD-10-CM | POA: Diagnosis not present

## 2014-06-02 DIAGNOSIS — D3A090 Benign carcinoid tumor of the bronchus and lung: Secondary | ICD-10-CM | POA: Diagnosis present

## 2014-06-02 DIAGNOSIS — F419 Anxiety disorder, unspecified: Secondary | ICD-10-CM | POA: Insufficient documentation

## 2014-06-02 DIAGNOSIS — R911 Solitary pulmonary nodule: Secondary | ICD-10-CM | POA: Diagnosis present

## 2014-06-02 DIAGNOSIS — I10 Essential (primary) hypertension: Secondary | ICD-10-CM | POA: Diagnosis not present

## 2014-06-02 DIAGNOSIS — Z87891 Personal history of nicotine dependence: Secondary | ICD-10-CM | POA: Insufficient documentation

## 2014-06-02 DIAGNOSIS — Z419 Encounter for procedure for purposes other than remedying health state, unspecified: Secondary | ICD-10-CM

## 2014-06-02 DIAGNOSIS — Z9889 Other specified postprocedural states: Secondary | ICD-10-CM

## 2014-06-02 HISTORY — PX: VIDEO BRONCHOSCOPY WITH ENDOBRONCHIAL NAVIGATION: SHX6175

## 2014-06-02 SURGERY — VIDEO BRONCHOSCOPY WITH ENDOBRONCHIAL NAVIGATION
Anesthesia: General | Site: Bronchus

## 2014-06-02 MED ORDER — MIDAZOLAM HCL 2 MG/2ML IJ SOLN
INTRAMUSCULAR | Status: DC | PRN
  Administered 2014-06-02 (×2): 1 mg via INTRAVENOUS
  Administered 2014-06-02: 2 mg via INTRAVENOUS

## 2014-06-02 MED ORDER — VECURONIUM BROMIDE 10 MG IV SOLR
INTRAVENOUS | Status: AC
  Filled 2014-06-02: qty 10

## 2014-06-02 MED ORDER — LACTATED RINGERS IV SOLN
INTRAVENOUS | Status: DC
Start: 2014-06-02 — End: 2014-06-02
  Administered 2014-06-02: 09:00:00 via INTRAVENOUS

## 2014-06-02 MED ORDER — MIDAZOLAM HCL 2 MG/2ML IJ SOLN
INTRAMUSCULAR | Status: AC
  Filled 2014-06-02: qty 2

## 2014-06-02 MED ORDER — NEOSTIGMINE METHYLSULFATE 10 MG/10ML IV SOLN
INTRAVENOUS | Status: DC | PRN
  Administered 2014-06-02: 4 mg via INTRAVENOUS

## 2014-06-02 MED ORDER — PROPOFOL 10 MG/ML IV BOLUS
INTRAVENOUS | Status: DC | PRN
  Administered 2014-06-02: 40 mg via INTRAVENOUS
  Administered 2014-06-02 (×2): 50 mg via INTRAVENOUS
  Administered 2014-06-02: 10 mg via INTRAVENOUS
  Administered 2014-06-02: 150 mg via INTRAVENOUS
  Administered 2014-06-02: 60 mg via INTRAVENOUS

## 2014-06-02 MED ORDER — ROCURONIUM BROMIDE 50 MG/5ML IV SOLN
INTRAVENOUS | Status: AC
  Filled 2014-06-02: qty 1

## 2014-06-02 MED ORDER — ONDANSETRON HCL 4 MG/2ML IJ SOLN
INTRAMUSCULAR | Status: DC | PRN
  Administered 2014-06-02: 4 mg via INTRAVENOUS

## 2014-06-02 MED ORDER — LACTATED RINGERS IV SOLN
INTRAVENOUS | Status: DC | PRN
  Administered 2014-06-02 (×2): via INTRAVENOUS

## 2014-06-02 MED ORDER — FENTANYL CITRATE (PF) 100 MCG/2ML IJ SOLN
INTRAMUSCULAR | Status: DC | PRN
  Administered 2014-06-02 (×3): 50 ug via INTRAVENOUS

## 2014-06-02 MED ORDER — FENTANYL CITRATE (PF) 100 MCG/2ML IJ SOLN
25.0000 ug | INTRAMUSCULAR | Status: DC | PRN
Start: 2014-06-02 — End: 2014-06-02
  Administered 2014-06-02 (×3): 50 ug via INTRAVENOUS

## 2014-06-02 MED ORDER — FENTANYL CITRATE (PF) 250 MCG/5ML IJ SOLN
INTRAMUSCULAR | Status: AC
  Filled 2014-06-02: qty 5

## 2014-06-02 MED ORDER — FENTANYL CITRATE (PF) 100 MCG/2ML IJ SOLN
50.0000 ug | Freq: Once | INTRAMUSCULAR | Status: AC
  Administered 2014-06-02: 50 ug via INTRAVENOUS

## 2014-06-02 MED ORDER — GLYCOPYRROLATE 0.2 MG/ML IJ SOLN
INTRAMUSCULAR | Status: AC
  Filled 2014-06-02: qty 1

## 2014-06-02 MED ORDER — ONDANSETRON HCL 4 MG/2ML IJ SOLN
INTRAMUSCULAR | Status: AC
  Filled 2014-06-02: qty 2

## 2014-06-02 MED ORDER — ROCURONIUM BROMIDE 100 MG/10ML IV SOLN
INTRAVENOUS | Status: DC | PRN
  Administered 2014-06-02: 40 mg via INTRAVENOUS
  Administered 2014-06-02: 10 mg via INTRAVENOUS

## 2014-06-02 MED ORDER — GLYCOPYRROLATE 0.2 MG/ML IJ SOLN
INTRAMUSCULAR | Status: DC | PRN
  Administered 2014-06-02: .6 mg via INTRAVENOUS

## 2014-06-02 MED ORDER — PHENYLEPHRINE HCL 10 MG/ML IJ SOLN
10.0000 mg | INTRAVENOUS | Status: DC | PRN
  Administered 2014-06-02: 25 ug/min via INTRAVENOUS

## 2014-06-02 MED ORDER — STERILE WATER FOR INJECTION IJ SOLN
INTRAMUSCULAR | Status: AC
  Filled 2014-06-02: qty 10

## 2014-06-02 MED ORDER — PROPOFOL 10 MG/ML IV BOLUS
INTRAVENOUS | Status: AC
  Filled 2014-06-02: qty 20

## 2014-06-02 MED ORDER — FENTANYL CITRATE (PF) 100 MCG/2ML IJ SOLN
INTRAMUSCULAR | Status: AC
  Administered 2014-06-02: 50 ug via INTRAVENOUS
  Filled 2014-06-02: qty 2

## 2014-06-02 MED ORDER — LIDOCAINE HCL (CARDIAC) 20 MG/ML IV SOLN
INTRAVENOUS | Status: DC | PRN
  Administered 2014-06-02: 60 mg via INTRAVENOUS

## 2014-06-02 MED ORDER — 0.9 % SODIUM CHLORIDE (POUR BTL) OPTIME
TOPICAL | Status: DC | PRN
  Administered 2014-06-02: 200 mL

## 2014-06-02 SURGICAL SUPPLY — 37 items
ADAPTER BRONCH F/PENTAX (ADAPTER) ×3 IMPLANT
BRUSH BIOPSY BRONCH 10 SDTNB (MISCELLANEOUS) ×2 IMPLANT
BRUSH BIOPSY BRONCH 10MM SDTNB (MISCELLANEOUS) ×1
BRUSH CYTOL CELLEBRITY 1.5X140 (MISCELLANEOUS) ×6 IMPLANT
BRUSH SUPERTRAX BIOPSY (INSTRUMENTS) IMPLANT
BRUSH SUPERTRAX NDL-TIP CYTO (INSTRUMENTS) ×3 IMPLANT
CANISTER SUCTION 2500CC (MISCELLANEOUS) ×3 IMPLANT
CHANNEL WORK EXTEND EDGE 180 (KITS) IMPLANT
CHANNEL WORK EXTEND EDGE 45 (KITS) IMPLANT
CHANNEL WORK EXTEND EDGE 90 (KITS) IMPLANT
CONT SPEC 4OZ CLIKSEAL STRL BL (MISCELLANEOUS) ×3 IMPLANT
COVER TABLE BACK 60X90 (DRAPES) ×3 IMPLANT
FILTER STRAW FLUID ASPIR (MISCELLANEOUS) IMPLANT
FORCEPS BIOP SUPERTRX PREMAR (INSTRUMENTS) ×3 IMPLANT
GAUZE SPONGE 4X4 12PLY STRL (GAUZE/BANDAGES/DRESSINGS) ×3 IMPLANT
GLOVE BIO SURGEON STRL SZ7.5 (GLOVE) ×6 IMPLANT
KIT CLEAN ENDO COMPLIANCE (KITS) ×3 IMPLANT
KIT LOCATABLE GUIDE (CANNULA) IMPLANT
KIT MARKER FIDUCIAL DELIVERY (KITS) IMPLANT
KIT PROCEDURE EDGE 180 (KITS) IMPLANT
KIT PROCEDURE EDGE 45 (KITS) IMPLANT
KIT PROCEDURE EDGE 90 (KITS) IMPLANT
KIT ROOM TURNOVER OR (KITS) ×3 IMPLANT
MARKER SKIN DUAL TIP RULER LAB (MISCELLANEOUS) ×3 IMPLANT
NEEDLE SUPERTRX PREMARK BIOPSY (NEEDLE) ×3 IMPLANT
NS IRRIG 1000ML POUR BTL (IV SOLUTION) ×3 IMPLANT
OIL SILICONE PENTAX (PARTS (SERVICE/REPAIRS)) ×3 IMPLANT
PAD ARMBOARD 7.5X6 YLW CONV (MISCELLANEOUS) ×6 IMPLANT
PATCHES PATIENT (LABEL) ×3 IMPLANT
SYR 20CC LL (SYRINGE) ×3 IMPLANT
SYR 20ML ECCENTRIC (SYRINGE) ×3 IMPLANT
SYR 50ML SLIP (SYRINGE) ×3 IMPLANT
SYSTEM GENCUT CORE BIOPSY (NEEDLE) ×3 IMPLANT
TOWEL OR 17X24 6PK STRL BLUE (TOWEL DISPOSABLE) ×3 IMPLANT
TRAP SPECIMEN MUCOUS 40CC (MISCELLANEOUS) IMPLANT
TUBE CONNECTING 20'X1/4 (TUBING) ×1
TUBE CONNECTING 20X1/4 (TUBING) ×2 IMPLANT

## 2014-06-02 NOTE — Op Note (Signed)
Video Bronchoscopy with Electromagnetic Navigation Procedure Note  Date of Operation: 06/02/2014  Pre-op Diagnosis: RLL nodule  Post-op Diagnosis: RLL nodule  Surgeon: Baltazar Apo  Assistants: none  Anesthesia: General endotracheal anesthesia  Operation: Flexible video fiberoptic bronchoscopy with electromagnetic navigation and biopsies.  Estimated Blood Loss: Minimal  Complications: none apparent  Indications and History: Breanna Dixon is a 69 y.o. female with hx of smoking. She has been evaluated by Dr Annamaria Boots for a solid, rounded RLL nodule. Recommendation was made to pursue tissue dx with navigational bronchoscopy.  The risks, benefits, complications, treatment options and expected outcomes were discussed with the patient.  The possibilities of pneumothorax, pneumonia, reaction to medication, pulmonary aspiration, perforation of a viscus, bleeding, failure to diagnose a condition and creating a complication requiring transfusion or operation were discussed with the patient who freely signed the consent.    Description of Procedure: The patient was seen in the Preoperative Area, was examined and was deemed appropriate to proceed.  The patient was taken to [facility] OR 10, identified as Breanna Dixon and the procedure verified as Flexible Video Fiberoptic Bronchoscopy.  A Time Out was held and the above information confirmed.   Prior to the date of the procedure a high-resolution CT scan of the chest was performed. Utilizing [place] a virtual tracheobronchial tree was generated to allow the creation of distinct navigation pathways to the patient's RLL parenchymal abnormality. After being taken to the operating room general anesthesia was initiated and the patient  was orally intubated. The video fiberoptic bronchoscope was introduced via the endotracheal tube and a general inspection was performed which showed normal airways throughout. Two R mainstem endobronchial brushings  were performed for Percepta molecular testing. The extendable working channel and locator guide were introduced into the bronchoscope. The distinct navigation pathways prepared prior to this procedure were then utilized to navigate to within 0.5-1.2cm of patient's RLL nodule identified on CT scan. The extendable working channel was secured into place and the locator guide was withdrawn. The proximal location of the RLL nodule made it difficult to maintain orientation of the working channel directly at the lesion. Under fluoroscopic guidance transbronchial needle brushings, transbronchial Wang needle biopsies, Gencut biopsies and transbronchial forceps biopsies were performed to be sent for cytology and pathology. A bronchioalveolar lavage was performed in the RLL and sent for cytology. At the end of the procedure a general airway inspection was performed and there was no evidence of active bleeding. The bronchoscope was removed.  The patient tolerated the procedure well. There was no significant blood loss and there were no obvious complications. A post-procedural chest x-ray is pending.  Samples: 1. Transbronchial needle brushings from RLL  2. Transbronchial Wang needle biopsies from RLL 3. Transbronchial forceps biopsies from RLL 4. Transbronchial Gencut biopsies from RLL  5. Bronchoalveolar lavage from RLL  6. Endobronchial brushings from R mainstem bronchus  Plans:  The patient will be discharged from the PACU to home when recovered from anesthesia and after chest x-ray is reviewed. We will review the cytology, pathology and microbiology results with the patient when they become available. Outpatient followup will be with Dr Lamonte Sakai or Dr Annamaria Boots.    Baltazar Apo, MD, PhD 06/02/2014, 1:17 PM [facility] [phone] or if no answer [phone]

## 2014-06-02 NOTE — Anesthesia Preprocedure Evaluation (Signed)
Anesthesia Evaluation  Patient identified by MRN, date of birth, ID band Patient awake    Reviewed: Allergy & Precautions, NPO status , Patient's Chart, lab work & pertinent test results  History of Anesthesia Complications Negative for: history of anesthetic complications  Airway Mallampati: I  TM Distance: >3 FB Neck ROM: Full    Dental  (+) Edentulous Upper, Edentulous Lower   Pulmonary COPD COPD inhaler, former smoker,  breath sounds clear to auscultation        Cardiovascular hypertension, Pt. on medications and Pt. on home beta blockers Rhythm:Regular     Neuro/Psych  Headaches, PSYCHIATRIC DISORDERS Anxiety  Neuromuscular disease    GI/Hepatic negative GI ROS, Neg liver ROS,   Endo/Other  Morbid obesity  Renal/GU negative Renal ROS     Musculoskeletal  (+) Arthritis -, Fibromyalgia -  Abdominal   Peds  Hematology negative hematology ROS (+)   Anesthesia Other Findings   Reproductive/Obstetrics                             Anesthesia Physical Anesthesia Plan  ASA: II  Anesthesia Plan: General   Post-op Pain Management:    Induction: Intravenous  Airway Management Planned: Oral ETT  Additional Equipment: None  Intra-op Plan:   Post-operative Plan: Extubation in OR  Informed Consent: I have reviewed the patients History and Physical, chart, labs and discussed the procedure including the risks, benefits and alternatives for the proposed anesthesia with the patient or authorized representative who has indicated his/her understanding and acceptance.   Dental advisory given  Plan Discussed with: CRNA and Surgeon  Anesthesia Plan Comments:         Anesthesia Quick Evaluation

## 2014-06-02 NOTE — Discharge Instructions (Signed)
Flexible Bronchoscopy, Care After These instructions give you information on caring for yourself after your procedure. Your doctor may also give you more specific instructions. Call your doctor if you have any problems or questions after your procedure. HOME CARE  Do not eat or drink anything for 2 hours after your procedure. If you try to eat or drink before the medicine wears off, food or drink could go into your lungs. You could also burn yourself.  After 2 hours have passed and when you can cough and gag normally, you may eat soft food and drink liquids slowly.  The day after the test, you may eat your normal diet.  You may do your normal activities.  Keep all doctor visits. GET HELP RIGHT AWAY IF:  You get more and more short of breath.  You get light-headed.  You feel like you are going to pass out (faint).  You have chest pain.  You have new problems that worry you.  You cough up more than a little blood.  You cough up more blood than before. MAKE SURE YOU:  Understand these instructions.  Will watch your condition.  Will get help right away if you are not doing well or get worse. Document Released: 11/19/2008 Document Revised: 01/27/2013 Document Reviewed: 09/26/2012 [facility] Patient Information 2015 [place], [place]. This information is not intended to replace advice given to you by your health care provider. Make sure you discuss any questions you have with your health care provider.  Please call our office for any concerns, problems or questions. [phone].

## 2014-06-02 NOTE — Interval H&P Note (Signed)
PCCM Interval Note  Pt is a smoker, followed by Dr Annamaria Boots, has a round, solid RLL nodule Referred for navigation bronchoscopy to facilitate biopsies.  Discussed CT scan and plans with the patient. All questions answered.   Filed Vitals:   06/02/14 0856 06/02/14 0857  BP:  111/48  Pulse:  74  Temp:  98.2 F (36.8 C)  TempSrc:  Oral  Resp:  20  Height: '5\' 8"'$  (1.727 m)   Weight: 103.964 kg (229 lb 3.2 oz) 103.874 kg (229 lb)  SpO2:  97%   Gen: Pleasant, well-nourished, in no distress,  normal affect  ENT: No lesions,  mouth clear,  oropharynx clear, no postnasal drip, M1 airway  Neck: No JVD, no TMG, no carotid bruits  Lungs: No use of accessory muscles, clear without rales or rhonchi  Cardiovascular: RRR, heart sounds normal, no murmur or gallops, no peripheral edema  Musculoskeletal: No deformities, no cyanosis or clubbing  Neuro: alert, non focal  Skin: Warm, no lesions or rashes    Recent Labs Lab 05/27/14 1246  HGB 12.1  HCT 39.0  WBC 6.1  PLT 249    Recent Labs Lab 05/27/14 1246  INR 1.07    Recent Labs Lab 05/27/14 1246  NA 138  K 4.3  CL 105  CO2 23  GLUCOSE 101*  BUN 13  CREATININE 0.84  CALCIUM 9.1    Plans:  Will proceed with navigational FOB to sample RLL nodule.   Baltazar Apo, MD, PhD 06/02/2014, 11:25 AM [facility] [phone] or if no answer [phone]

## 2014-06-02 NOTE — Anesthesia Postprocedure Evaluation (Signed)
  Anesthesia Post-op Note  Patient: Breanna Dixon  Procedure(s) Performed: Procedure(s): VIDEO BRONCHOSCOPY WITH ENDOBRONCHIAL NAVIGATION (N/A)  Patient Location: PACU  Anesthesia Type:General  Level of Consciousness: awake  Airway and Oxygen Therapy: Patient Spontanous Breathing  Post-op Pain: mild  Post-op Assessment: Post-op Vital signs reviewed, Patient's Cardiovascular Status Stable, Respiratory Function Stable, Patent Airway, No signs of Nausea or vomiting and Pain level controlled  Post-op Vital Signs: Reviewed and stable  Last Vitals:  Filed Vitals:   06/02/14 1415  BP: 108/42  Pulse: 73  Temp:   Resp: 19    Complications: No apparent anesthesia complications

## 2014-06-02 NOTE — Anesthesia Procedure Notes (Signed)
Procedure Name: Intubation Date/Time: 06/02/2014 11:32 AM Performed by: Mariea Clonts Pre-anesthesia Checklist: Patient identified, Timeout performed, Emergency Drugs available, Suction available and Patient being monitored Patient Re-evaluated:Patient Re-evaluated prior to inductionOxygen Delivery Method: Circle system utilized Preoxygenation: Pre-oxygenation with 100% oxygen Intubation Type: IV induction Ventilation: Mask ventilation without difficulty Laryngoscope Size: Miller and 2 Grade View: Grade I Tube type: Oral Number of attempts: 1 Placement Confirmation: breath sounds checked- equal and bilateral,  ETT inserted through vocal cords under direct vision and positive ETCO2 Secured at: 24 cm Tube secured with: Tape Dental Injury: Teeth and Oropharynx as per pre-operative assessment

## 2014-06-02 NOTE — Transfer of Care (Signed)
Immediate Anesthesia Transfer of Care Note  Patient: Breanna Dixon  Procedure(s) Performed: Procedure(s): VIDEO BRONCHOSCOPY WITH ENDOBRONCHIAL NAVIGATION (N/A)  Patient Location: PACU  Anesthesia Type:General  Level of Consciousness: awake, alert  and oriented  Airway & Oxygen Therapy: Patient Spontanous Breathing and Patient connected to nasal cannula oxygen  Post-op Assessment: Report given to RN and Post -op Vital signs reviewed and stable  Post vital signs: Reviewed and stable  Last Vitals:  Filed Vitals:   06/02/14 1330  BP: 101/62  Pulse: 75  Temp: 36.5 C  Resp: 16    Complications: No apparent anesthesia complications

## 2014-06-02 NOTE — H&P (View-Only) (Signed)
01/15/12- 66 yoF heavy smoker recently hosp for exacerbation of COPD and now referred for pulmonary management. Hospitalized 11/29 through 01/12/2012 with discharge diagnoses of acute respiratory failure with hypoxia, COPD, tobacco abuse.. She had moved to this area from the [place] in 2009. Since here she has had more bronchitis with season change. Recent hospital for typical exacerbation. Now in physical therapy for rehabilitation after bilateral total knee replacements (05/15/2011, 10/14/2011). She has smoked one pack per day since 1967 and states that she stopped smoking November 28, using patches. Breathing is better now than usual from prehospital baseline. She never has had a cough. Activity has been limited by her knees but she thinks she has less dyspnea on exertion now. She is not needing her Combivent but continues Spiriva. A right lower lobe nodule was found April 2013. CT chest 12/21/2011 described as stable well-circumscribed right lower lobe nodule and recommended PET. Noted diffuse old granulomatous disease with calcification including calcified mediastinal and hilar nodes, granulomas in spleen and calcified nodes in the upper abdomen. No history of TB exposure. Pulmonary embolism 2006/IVC filter. Negative VQ scan 10/21/2011. She reports having a PET scan through [facility] in [place] this summer. We need to verify that and get that report.  Treated for hypertension but denies heart disease. 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 Adopted. CT 12/21/11-reviewed IMPRESSION:  1. Stable well circumscribed nodule within the right lower lobe.  Consider PET CT for metabolic activity if indicated clinically.  2. Diffuse changes of prior granulomatous disease with calcified  left lower lobe granulomas, mediastinal and hilar calcified nodes,  calcified splenic granuloma, and calcified nodes in the upper  abdomen.  Original Report Authenticated By: Paul Barry,  M.D.   05/16/12- 66 yoF former heavy smoker followed for  COPD, lung nodule RLL  FOLLOWS FOR: pt states breathing is doing good overall(used Rescue inhaler once since hospital stay); review PFT and mammogram results with patient. States she quit smoking cigarettes in November, 2013. Had CT chest 12/21/2011 and PET scan at [facility],  her pulmonologist at [facility] retired. Now denies shortness of breath, cough, phlegm wheeze. Using Spiriva daily. History of bilateral lumpectomy. Mammograms were negative 01/23/2012.  CXR 01/04/12 IMPRESSION:  Coarse lung markings most likely represent chronic changes but  difficult to exclude mild edema. There is no focal airspace  disease.  Nodule in the right lower lung. This was better characterized on  the chest CT from 12/21/2011.  Original Report Authenticated By: Adam Henn, M.D. PFT 05/16/2012: Mild obstructive airways disease with insignificant response to bronchodilator, normal lung volumes, diffusion mildly reduced. FVC 3.08/96%, FEV1 2.01/86%, FEV1/FEC 0.65. TLC 95%, DLCO 73%.  07/07/12- 66 yoF former heavy smoker followed for  COPD, lung nodule RLL , Hx PE/ IVC filter FOLLOWS FOR: Denies any SOB, wheezing, cough, or congestion; overall breathing is doing good per patient. Tells me in past year she has had TKR both knees and lumbar spine sgy. She gives additional medical hx of PE x2 w/ IVC filter 2006. 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 Fibromyalgia. 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 Says breathing "fine". Discussed f/u nodules.  01/06/13- 67 yoF former heavy smoker followed for  COPD, lung nodule RLL , Hx PE/ IVC filter FOLLOWS FOR:at primary md 1 wk. ago had rales,sob-worse,chest pain and tightness,cough-yellow x 2 wks.,wheezing ,fcs  Says she felt this way last year in   the fall. Recent chest congestion, cough with clear or trace yellow, tight,  back pain off and on, some low-grade fever and sweats. Using rescue inhaler for the first time in a year. Not smoking. CT chest 08/13/12 IMPRESSION:  Stable 1.9 cm smoothly marginated ovoid lesion in the right lower  lobe. IV contrast was administered today and this does not appear  to represent an AV malformation. The nodule also shows no evidence (IR did not do needle bx.) of contrast enhancement. Given all of these factors as well as the  presence of multiple abutting blood vessels which may make the  biopsy of higher risk, decision was made to not perform a  percutaneous biopsy today and follow the lesion by imaging.  Findings were discussed directly with Dr. Halston Kintz.  Original Report Authenticated By: Glenn Yamagata, M.D. PET elsewhere 06/29/11- IVC filter, old granulomatous disease. RLL nodule 18/16 mm, SUV 1.4 PET 07/16/12 IMPRESSION:  1.9 x 1.8 cm right lower lobe pulmonary nodule, with suspected slow  interval growth and associated vague hypermetabolism, max SUV 2.5.  While indeterminate, the overall appearance is worrisome for  primary bronchogenic neoplasm. Consider bronchoscopy or  percutaneous biopsy for further evaluation.  No evidence of metastatic disease.  Original Report Authenticated By: Sriyesh Krishnan, M.D  07/14/13- 67 yoF former heavy smoker followed for  COPD, lung nodule RLL , Hx PE/ IVC filter, hx discoid lupus rash FOLLOWS FOR:No trouble with breathing as long as she is not in pain. Per note last visit, IR did not bx nodule. Moved to old home with many steps. Using Combivent less than once daily while continuing Spiriva. Discoid lupus rash is worse in heat CT 07/14/13 IMPRESSION:  Old granulomatous disease.  Stable 3 mm RIGHT upper lobe noncalcified pulmonary nodule.  Stable lobulated RIGHT lower lobe mass ; due to growth on prior exam  and hypermetabolism on PET-CT, recommend continued surveillance to  exclude malignancy.  No new intrathoracic abnormalities.   Electronically Signed  By: Mark Boles M.D.  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 Tried Spiriva but couldn't manage the dispenser well. She relates dyspnea on exertion to obesity and deconditioning, noticed mainly on stairs.  ROS-see HPI Constitutional:   No-   weight loss, night sweats, +fevers, chills, fatigue, lassitude. HEENT:   No-  headaches, difficulty swallowing, tooth/dental problems, sore throat,       No-  sneezing, itching, ear ache, nasal congestion, post nasal drip,  CV:  No-   chest pain, orthopnea, PND, swelling in lower extremities, anasarca,  dizziness, palpitations Resp: +shortness of breath with exertion or at rest.              +cough,  No non-productive cough,  No- coughing up of blood.              No-   change in color of mucus.  No- wheezing.   Skin: + rash or lesions. GI:  No-   heartburn, indigestion, abdominal pain, nausea, vomiting,  GU: . MS:  No-   joint pain or swelling. Neuro-     nothing unusual Psych:  No- change in mood or affect. No depression or anxiety.  No memory loss.  OBJ- Physical   Exam General- Alert, Oriented, Affect-appropriate, Distress- none acute, +obese Skin- + rash dorsum left hand reported discoid lupus Lymphadenopathy- none Head- atraumatic            Eyes- Gross vision intact, PERRLA, conjunctivae and secretions clear            Ears- Hearing, canals-normal            Nose- Clear, no-Septal dev, mucus, polyps, erosion, perforation             Throat- Mallampati II , mucosa clear , drainage- none, tonsils- atrophic. Dentures Neck- flexible , trachea midline, no stridor , thyroid nl, carotid no bruit Chest - symmetrical  excursion , unlabored           Heart/CV- RRR , no murmur , no gallop  , no rub, nl s1 s2                           - JVD- none , edema- none, stasis changes- none, varices- none           Lung- clear to P&A, wheeze- none, cough- none , dullness-none, rub- none           Chest wall-  Abd-  Br/ Gen/ Rectal- Not done, not indicated Extrem- cyanosis- none, clubbing, none, atrophy- none, strength- nl. + bilateral TKR scars, Cane Neuro- grossly intact to observation    

## 2014-06-03 ENCOUNTER — Encounter (HOSPITAL_COMMUNITY): Payer: Self-pay | Admitting: Emergency Medicine

## 2014-06-04 ENCOUNTER — Telehealth: Payer: Self-pay | Admitting: Emergency Medicine

## 2014-06-04 NOTE — Telephone Encounter (Signed)
Reviewed results with the patient. bx's mostly consistent with an indolent carcinoid tumor. Explained that depending on her ability to tolerate, that Dr Annamaria Boots may recommend resection. She has an appt to review w him soon.

## 2014-06-08 ENCOUNTER — Encounter: Payer: Self-pay | Admitting: Internal Medicine

## 2014-06-08 ENCOUNTER — Ambulatory Visit (INDEPENDENT_AMBULATORY_CARE_PROVIDER_SITE_OTHER): Payer: Medicare Other | Admitting: Internal Medicine

## 2014-06-08 ENCOUNTER — Ambulatory Visit (INDEPENDENT_AMBULATORY_CARE_PROVIDER_SITE_OTHER)
Admission: RE | Admit: 2014-06-08 | Discharge: 2014-06-08 | Disposition: A | Discharge status: Home / Self Care | Payer: Medicare Other | Source: Ambulatory Visit | Attending: Internal Medicine | Admitting: Internal Medicine

## 2014-06-08 ENCOUNTER — Other Ambulatory Visit (INDEPENDENT_AMBULATORY_CARE_PROVIDER_SITE_OTHER): Payer: Medicare Other

## 2014-06-08 VITALS — BP 130/64 | HR 82 | Ht 66.0 in | Wt 229.0 lb

## 2014-06-08 DIAGNOSIS — D3A00 Benign carcinoid tumor of unspecified site: Secondary | ICD-10-CM | POA: Diagnosis not present

## 2014-06-08 DIAGNOSIS — D1431 Benign neoplasm of right bronchus and lung: Secondary | ICD-10-CM | POA: Diagnosis not present

## 2014-06-08 DIAGNOSIS — J449 Chronic obstructive pulmonary disease, unspecified: Secondary | ICD-10-CM | POA: Diagnosis not present

## 2014-06-08 LAB — CBC WITH DIFFERENTIAL/PLATELET
Basophils Absolute: 0 10*3/uL (ref 0.0–0.1)
Basophils Relative: 0.5 % (ref 0.0–3.0)
EOS ABS: 0 10*3/uL (ref 0.0–0.7)
Eosinophils Relative: 0.5 % (ref 0.0–5.0)
HEMATOCRIT: 38.2 % (ref 36.0–46.0)
Hemoglobin: 12.9 g/dL (ref 12.0–15.0)
LYMPHS ABS: 1.4 10*3/uL (ref 0.7–4.0)
Lymphocytes Relative: 20.8 % (ref 12.0–46.0)
MCHC: 33.9 g/dL (ref 30.0–36.0)
MCV: 83.2 fl (ref 78.0–100.0)
Monocytes Absolute: 0.4 10*3/uL (ref 0.1–1.0)
Monocytes Relative: 6 % (ref 3.0–12.0)
NEUTROS PCT: 72.2 % (ref 43.0–77.0)
Neutro Abs: 4.9 10*3/uL (ref 1.4–7.7)
PLATELETS: 250 10*3/uL (ref 150.0–400.0)
RBC: 4.59 Mil/uL (ref 3.87–5.11)
RDW: 13.1 % (ref 11.5–15.5)
WBC: 6.8 10*3/uL (ref 4.0–10.5)

## 2014-06-08 NOTE — Assessment & Plan Note (Signed)
Previously documented mild obstructive airways disease in this former smoker. Because she is pending surgical evaluation, we will update PFT.

## 2014-06-08 NOTE — Progress Notes (Signed)
01/15/12- 8 yoF heavy smoker recently hosp for exacerbation of COPD and now referred for pulmonary management. Hospitalized 11/29 through 01/12/2012 with discharge diagnoses of acute respiratory failure with hypoxia, COPD, tobacco abuse.. She had moved to this area from the [place] in 2009. Since here she has had more bronchitis with season change. Recent hospital for typical exacerbation. Now in physical therapy for rehabilitation after bilateral total knee replacements (05/15/2011, 10/14/2011). She has smoked one pack per day since 1967 and states that she stopped smoking November 28, using patches. Breathing is better now than usual from prehospital baseline. She never has had a cough. Activity has been limited by her knees but she thinks she has less dyspnea on exertion now. She is not needing her Combivent but continues Spiriva. A right lower lobe nodule was found April 2013. CT chest 12/21/2011 described as stable well-circumscribed right lower lobe nodule and recommended PET. Noted diffuse old granulomatous disease with calcification including calcified mediastinal and hilar nodes, granulomas in spleen and calcified nodes in the upper abdomen. No history of TB exposure. Pulmonary embolism 2006/IVC filter. Negative VQ scan 10/21/2011. She reports having a PET scan through [facility] in [place] this summer. We need to verify that and get that report.  Treated for hypertension but denies heart disease. 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 Adopted. CT 12/21/11-reviewed IMPRESSION:  1. Stable well circumscribed nodule within the right lower lobe.  Consider PET CT for metabolic activity if indicated clinically.  2. Diffuse changes of prior granulomatous disease with calcified  left lower lobe granulomas, mediastinal and hilar calcified nodes,  calcified splenic granuloma, and calcified nodes in the upper  abdomen.  Original Report Authenticated By: Ivar Drape,  M.D.   05/16/12- 4 yoF former heavy smoker followed for  COPD, lung nodule RLL  FOLLOWS FOR: pt states breathing is doing good overall(used Rescue inhaler once since hospital stay); review PFT and mammogram results with patient. States she quit smoking cigarettes in November, 2013. Had CT chest 12/21/2011 and PET scan at [facility],  her pulmonologist at [facility] retired. Now denies shortness of breath, cough, phlegm wheeze. Using Spiriva daily. History of bilateral lumpectomy. Mammograms were negative 01/23/2012.  CXR 01/04/12 IMPRESSION:  Coarse lung markings most likely represent chronic changes but  difficult to exclude mild edema. There is no focal airspace  disease.  Nodule in the right lower lung. This was better characterized on  the chest CT from 12/21/2011.  Original Report Authenticated By: Markus Daft, M.D. PFT 05/16/2012: Mild obstructive airways disease with insignificant response to bronchodilator, normal lung volumes, diffusion mildly reduced. FVC 3.08/96%, FEV1 2.01/86%, FEV1/FVC 0.65. TLC 95%, DLCO 73%.  07/07/12- 29 yoF former heavy smoker followed for  COPD, lung nodule RLL , Hx PE/ IVC filter FOLLOWS FOR: Denies any SOB, wheezing, cough, or congestion; overall breathing is doing good per patient. Tells me in past year she has had TKR both knees and lumbar spine sgy. She gives additional medical hx of PE x2 w/ IVC filter 2006. 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 Fibromyalgia. 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 Says breathing "fine". Discussed f/u nodules.  01/06/13- 82 yoF former heavy smoker followed for  COPD, lung nodule RLL , Hx PE/ IVC filter FOLLOWS FOR:at primary md 1 wk. ago had rales,sob-worse,chest pain and tightness,cough-yellow x 2 wks.,wheezing ,fcs  Says she felt this way last year in  the fall. Recent chest congestion, cough with clear or trace yellow, tight,  back pain off and on, some low-grade fever and sweats. Using rescue inhaler for the first time in a year. Not smoking. CT chest 08/13/12 IMPRESSION:  Stable 1.9 cm smoothly marginated ovoid lesion in the right lower  lobe. IV contrast was administered today and this does not appear  to represent an AV malformation. The nodule also shows no evidence (IR did not do needle bx.) of contrast enhancement. Given all of these factors as well as the  presence of multiple abutting blood vessels which may make the  biopsy of higher risk, decision was made to not perform a  percutaneous biopsy today and follow the lesion by imaging.  Findings were discussed directly with Dr. Annamaria Boots.  Original Report Authenticated By: Aletta Edouard, M.D. PET elsewhere 06/29/11- IVC filter, old granulomatous disease. RLL nodule 18/16 mm, SUV 1.4 PET 07/16/12 IMPRESSION:  1.9 x 1.8 cm right lower lobe pulmonary nodule, with suspected slow  interval growth and associated vague hypermetabolism, max SUV 2.5.  While indeterminate, the overall appearance is worrisome for  primary bronchogenic neoplasm. Consider bronchoscopy or  percutaneous biopsy for further evaluation.  No evidence of metastatic disease.  Original Report Authenticated By: Julian Hy, M.D  07/14/13- 39 yoF former heavy smoker followed for  COPD, lung nodule RLL , Hx PE/ IVC filter, hx discoid lupus rash FOLLOWS FOR:No trouble with breathing as long as she is not in pain. Per note last visit, IR did not bx nodule. Moved to old home with many steps. Using Combivent less than once daily while continuing Spiriva. Discoid lupus rash is worse in heat CT 07/14/13 IMPRESSION:  Old granulomatous disease.  Stable 3 mm RIGHT upper lobe noncalcified pulmonary nodule.  Stable lobulated RIGHT lower lobe mass ; due to growth on prior exam  and hypermetabolism on PET-CT, recommend continued surveillance to  exclude malignancy.  No new intrathoracic abnormalities.   Electronically Signed  By: Lavonia Dana M.D.  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 Tried Spiriva but couldn't manage the dispenser well. She relates dyspnea on exertion to obesity and deconditioning, noticed mainly on stairs.  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  Friend and son here, daughter was on speaker phone. Since bronch she has had focal R chest pain in mid clavicular line, tender to pressure but not pleuritic. Cough w scant clear mucus and hoarseness from  Bronch are clearing. Had some loose stools ? Dark. CT chest 05/05/14 IMPRESSION: Continued interval growth of the right lower lobe pulmonary nodule, now measuring 2.6 cm in diameter. Neoplasm remains a concern. Otherwise stable exam. Electronically Signed  By: EMisty StanleyM.D.  On: 05/05/2014 15:30  ROS-see HPI Constitutional:   No-   weight loss, night sweats, +fevers, chills, fatigue, lassitude. HEENT:   No-  headaches, difficulty swallowing, tooth/dental problems, sore throat,       No-  sneezing, itching, ear ache, nasal congestion, post nasal drip,  CV:  No-   chest pain, orthopnea, PND, swelling in lower extremities, anasarca,  dizziness, palpitations Resp: +shortness of breath with exertion or at rest.              +cough,  No non-productive cough,  No- coughing up of blood.              No-   change in color of mucus.  No- wheezing.   Skin: + rash or lesions. GI:  No-   heartburn, indigestion,  abdominal pain, nausea, vomiting,  GU: . MS:  No-   joint pain or swelling. Neuro-     nothing unusual Psych:  No- change in mood or affect. No depression or anxiety.  No memory loss.  OBJ- Physical Exam General- Alert, Oriented, Affect-appropriate, Distress- none acute, +obese Skin- + rash dorsum left hand reported discoid lupus Lymphadenopathy- none Head- atraumatic            Eyes- Gross vision intact, PERRLA, conjunctivae and secretions clear            Ears- Hearing, canals-normal            Nose- Clear, no-Septal dev, mucus, polyps, erosion, perforation             Throat- Mallampati II , mucosa clear , drainage- none, tonsils- atrophic. Dentures Neck- flexible , trachea midline, no stridor , thyroid nl, carotid no bruit Chest - symmetrical excursion , unlabored           Heart/CV- RRR , no murmur , no gallop  , no rub, nl s1 s2                           - JVD- none , edema- none, stasis changes- none, varices- none           Lung- clear to P&A, wheeze- none, cough- none , dullness-none, rub- none           Chest wall- +tender to finger pressure R low ant rib in MCL Abd-  Br/ Gen/ Rectal- Not done, not indicated Extrem- cyanosis- none, clubbing, none, atrophy- none, strength- nl.               + bilateral TKR scars, Cane Neuro- grossly intact to observation

## 2014-06-08 NOTE — Patient Instructions (Addendum)
Order- CXR  Dx RLL carcinoid tumor   Order- Schedule PFT at [facility]  Dx COPD, lung nodule  Order- lab CBC w diff  Order- referral to Thoracic Surgery for dx carcinoid lung nodule

## 2014-06-08 NOTE — Assessment & Plan Note (Signed)
She is returning to baseline after bronchoscopy. She is very eager to discuss having this lesion resected. Plan-thoracic surgery referral to consider resection of right lower lobe carcinoid tumor

## 2014-06-14 ENCOUNTER — Ambulatory Visit (HOSPITAL_COMMUNITY)
Admission: RE | Admit: 2014-06-14 | Discharge: 2014-06-14 | Disposition: A | Discharge status: Home / Self Care | Payer: Medicare Other | Source: Ambulatory Visit | Attending: Internal Medicine | Admitting: Internal Medicine

## 2014-06-14 DIAGNOSIS — R0600 Dyspnea, unspecified: Secondary | ICD-10-CM | POA: Diagnosis not present

## 2014-06-14 DIAGNOSIS — D3A00 Benign carcinoid tumor of unspecified site: Secondary | ICD-10-CM | POA: Diagnosis present

## 2014-06-14 DIAGNOSIS — Z87891 Personal history of nicotine dependence: Secondary | ICD-10-CM | POA: Insufficient documentation

## 2014-06-14 LAB — PULMONARY FUNCTION TEST
DL/VA % pred: 87 %
DL/VA: 4.51 ml/min/mmHg/L
DLCO unc % pred: 72 %
DLCO unc: 20.62 ml/min/mmHg
FEF 25-75 Post: 1.81 L/sec
FEF 25-75 Pre: 1.16 L/sec
FEF2575-%CHANGE-POST: 56 %
FEF2575-%PRED-POST: 81 %
FEF2575-%Pred-Pre: 52 %
FEV1-%Change-Post: 8 %
FEV1-%PRED-PRE: 74 %
FEV1-%Pred-Post: 81 %
FEV1-Post: 2.16 L
FEV1-Pre: 1.99 L
FEV1FVC-%CHANGE-POST: 7 %
FEV1FVC-%Pred-Pre: 90 %
FEV6-%Change-Post: 3 %
FEV6-%Pred-Post: 86 %
FEV6-%Pred-Pre: 83 %
FEV6-PRE: 2.8 L
FEV6-Post: 2.9 L
FEV6FVC-%Change-Post: 1 %
FEV6FVC-%PRED-PRE: 102 %
FEV6FVC-%Pred-Post: 104 %
FVC-%Change-Post: 1 %
FVC-%Pred-Post: 83 %
FVC-%Pred-Pre: 81 %
FVC-POST: 2.9 L
FVC-Pre: 2.85 L
POST FEV1/FVC RATIO: 75 %
Post FEV6/FVC ratio: 100 %
Pre FEV1/FVC ratio: 70 %
Pre FEV6/FVC Ratio: 98 %
RV % pred: 99 %
RV: 2.25 L
TLC % PRED: 95 %
TLC: 5.25 L

## 2014-06-14 MED ORDER — ALBUTEROL SULFATE (2.5 MG/3ML) 0.083% IN NEBU
2.5000 mg | INHALATION_SOLUTION | Freq: Once | RESPIRATORY_TRACT | Status: AC
  Administered 2014-06-14: 2.5 mg via RESPIRATORY_TRACT

## 2014-06-16 ENCOUNTER — Encounter: Payer: Self-pay | Admitting: Surgery

## 2014-06-16 ENCOUNTER — Institutional Professional Consult (permissible substitution) (INDEPENDENT_AMBULATORY_CARE_PROVIDER_SITE_OTHER): Payer: Medicare Other | Admitting: Surgery

## 2014-06-16 VITALS — BP 116/68 | HR 74 | Resp 16 | Ht 67.5 in | Wt 229.0 lb

## 2014-06-16 DIAGNOSIS — C3431 Malignant neoplasm of lower lobe, right bronchus or lung: Secondary | ICD-10-CM

## 2014-06-16 DIAGNOSIS — Z86711 Personal history of pulmonary embolism: Secondary | ICD-10-CM

## 2014-06-16 DIAGNOSIS — Z853 Personal history of malignant neoplasm of breast: Secondary | ICD-10-CM

## 2014-06-17 ENCOUNTER — Encounter: Payer: Self-pay | Admitting: Surgery

## 2014-06-17 NOTE — Progress Notes (Signed)
PCP is Arlyss Repress, MD Referring Provider is Deneise Lever, MD  Chief Complaint  Patient presents with  . Lung Cancer    neuroendocirne right lower lobe, CT CHEST/ PET/PFT'S    HPI:  The patient is a 69 year old former heavy smoker with a history of fibromyalgia, Lupus, PTSD, anxiety disorder, PE with what sounds like shock in 2006. She underwent bilateral total knee replacements in 05/2011 and 10/2012 and a CXR at that time showed a RLL nodule. A CT scan on 05/30/2011 showed a 17 mm x 16 mm RLL nodule. A PET scan was done at [facility] and the report showed a 16 x 18 mm nodule with an SUV of 1.4. This lesion as been followed with serial scans and a CT of the chest on 05/05/2014 showed that it had continued to grow and was now 2.6 cm. A PET scan on 05/14/2014 showed low level activity with an SUV of 2.1. There were no other areas of hyperactivity. She underwent a bronch and ENB by Dr. Lamonte Sakai on 06/02/2014 and biopsy showed probable low grade neuroendocrine tumor ( Carcinoid) although there was only a very limited amount of tumor in the sample.   Past Medical History  Diagnosis Date  . Cancer     breast hx  . Seasonal allergies     flonase prn  . Lung mass April 2013    Right lower lobe lung mass  . Arthritis   . Pulmonary embolism 2006    IVC filter  . PTSD (post-traumatic stress disorder)     takes Cymbalta daily  . Anxiety     takes Ativan daily  . Fibromyalgia     takes Lyrica daily  . History of bronchitis   . Hyperlipidemia     takes Fenofibrate daily  . COPD (chronic obstructive pulmonary disease)   . Pneumonia     hx of  . Headache(784.0)     takes Tegretol and Verapamil nightly  . Hypertension     takes Toprol daily  . Hypertension   . Vertigo     takes Antivert prn  . Lupus     takes Cymbalta,Plaquenil  daily  . Joint pain   . History of ulcer disease     pyloric   . History of colon polyps     benign  . History of blood transfusion     no  abnormal reaction noted    Past Surgical History  Procedure Laterality Date  . Cholecystectomy  1994  . Back surgery  2000/2010    fusion  . Tonsillectomy  1967  . Appendectomy  1965  . Cystoscopy      multiple  . Urethral dilation  1971  . Breast surgery      bilateral partial masectomy  . Abdominal hysterectomy  1992  . Dilation and curettage of uterus    . Hernia repair    . Colonoscopy    . Knee arthroscopy Bilateral   . Total knee arthroplasty  06/04/2011    Procedure: TOTAL KNEE ARTHROPLASTY;  Surgeon: Rudean Haskell, MD;  Location: [place];  Service: Orthopedics;  Laterality: Right;  . Vena cava filter placement  2006  . Incisional hernia repair    . Multiple tooth extractions  1990  . Joint replacement    . Total knee arthroplasty  10/15/2011    Procedure: TOTAL KNEE ARTHROPLASTY;  Surgeon: Rudean Haskell, MD;  Location: [place];  Service: Orthopedics;  Laterality:  Left;  . Ivc filter    . Video bronchoscopy with endobronchial navigation N/A 06/02/2014    Procedure: VIDEO BRONCHOSCOPY WITH ENDOBRONCHIAL NAVIGATION;  Surgeon: Collene Gobble, MD;  Location: [facility];  Service: Thoracic;  Laterality: N/A;    Family History  Problem Relation Age of Onset  . Adopted: Yes  . Anesthesia problems Neg Hx   . Hypotension Neg Hx   . Malignant hyperthermia Neg Hx   . Pseudochol deficiency Neg Hx     Social History History  Substance Use Topics  . Smoking status: Former Smoker -- 0.75 packs/day for 7 years    Types: Cigarettes    Quit date: 06/15/2012  . Smokeless tobacco: Never Used     Comment: quit smoking 44yr ago  . Alcohol Use: No    Current Outpatient Prescriptions  Medication Sig Dispense Refill  . carbamazepine (TEGRETOL XR) 200 MG 12 hr tablet Take 200-400 mg by mouth 2 (two) times daily. '200mg'$  in am and '400mg'$  at bedtime    . clotrimazole-betamethasone (LOTRISONE) cream Apply 1 application topically 2 (two) times daily. 2 times a day as needed    . DULoxetine  (CYMBALTA) 60 MG capsule Take 120 mg by mouth daily.    . fenofibrate micronized (LOFIBRA) 134 MG capsule Take 134 mg by mouth daily before breakfast.    . Fluticasone Furoate-Vilanterol (BREO ELLIPTA) 100-25 MCG/INH AEPB 1 puff, once daily and rinse mouth- maintenance (Patient taking differently: Inhale 2 puffs into the lungs daily. 1 puff, once daily and rinse mouth- maintenance) 60 each 11  . furosemide (LASIX) 20 MG tablet Take 1 tablet (20 mg total) by mouth daily. 30 tablet 2  . hydroxychloroquine (PLAQUENIL) 200 MG tablet Take 1 tablet by mouth 2 (two) times daily.    . hydrOXYzine (ATARAX/VISTARIL) 25 MG tablet Take 25 mg by mouth 3 (three) times daily as needed for itching (insomnia).     . Ipratropium-Albuterol (COMBIVENT) 20-100 MCG/ACT AERS respimat Inhale 2 puffs into the lungs every 6 (six) hours as needed for wheezing.    .Marland KitchenLORazepam (ATIVAN) 2 MG tablet Take 2 mg by mouth 3 (three) times daily.     . meclizine (ANTIVERT) 25 MG tablet Take 25 mg by mouth 3 (three) times daily as needed for dizziness.    . metoprolol succinate (TOPROL-XL) 100 MG 24 hr tablet Take 1 tablet (100 mg total) by mouth daily. Take with or immediately following a meal. 30 tablet 1  . pregabalin (LYRICA) 75 MG capsule Take 75 mg by mouth 2 (two) times daily.     . verapamil (COVERA HS) 240 MG (CO) 24 hr tablet Take 240 mg by mouth at bedtime.    . Vitamin D, Ergocalciferol, (DRISDOL) 50000 UNITS CAPS capsule Take 50,000 Units by mouth every 7 (seven) days. Monday     No current facility-administered medications for this visit.    Allergies  Allergen Reactions  . Imitrex [Sumatriptan Base] Nausea And Vomiting    Rapid heart beat.  .Marland KitchenKetorolac Tromethamine Nausea And Vomiting    Rapid heart beat.  . Stadol [Butorphanol Tartrate] Nausea And Vomiting  . Ivp Dye [Iodinated Diagnostic Agents] Nausea Only and Palpitations    Can tolerate iodine  . Shellfish Allergy Nausea Only and Palpitations    Review of  Systems  Constitutional: Negative for fever, activity change, appetite change, fatigue and unexpected weight change.  HENT: Negative.   Eyes: Negative.   Respiratory: Positive for cough and shortness of breath.  Cardiovascular: Negative for chest pain, palpitations and leg swelling.  Gastrointestinal: Negative.   Endocrine: Negative.   Genitourinary: Negative.   Musculoskeletal: Positive for back pain.  Skin: Negative.   Allergic/Immunologic: Negative.   Neurological: Negative.   Hematological: Negative.   Psychiatric/Behavioral: The patient is nervous/anxious.     BP 116/68 mmHg  Pulse 74  Resp 16  Ht 5' 7.5" (1.715 m)  Wt 229 lb (103.874 kg)  BMI 35.32 kg/m2  SpO2 98%  LMP 06/04/2011 Physical Exam  Constitutional: She is oriented to person, place, and time. She appears well-developed and well-nourished. No distress.  HENT:  Head: Normocephalic and atraumatic.  Mouth/Throat: Oropharynx is clear and moist.  Eyes: EOM are normal. Pupils are equal, round, and reactive to light.  Neck: Normal range of motion. Neck supple. No thyromegaly present.  Cardiovascular: Normal rate, regular rhythm, normal heart sounds and intact distal pulses.   No murmur heard. Pulmonary/Chest: Effort normal and breath sounds normal. No respiratory distress. She has no wheezes. She has no rales.  Abdominal: Soft. Bowel sounds are normal. She exhibits no distension and no mass. There is no tenderness.  Musculoskeletal: Normal range of motion. She exhibits no edema.  Lymphadenopathy:    She has no cervical adenopathy.  Neurological: She is alert and oriented to person, place, and time. She has normal strength. No cranial nerve deficit or sensory deficit.  Skin: Skin is warm and dry.  Psychiatric: She has a normal mood and affect.     Diagnostic Tests:   CLINICAL DATA: Subsequent treatment strategy for lung mass.  EXAM: NUCLEAR MEDICINE PET SKULL BASE TO THIGH  TECHNIQUE: Eleven point for  mCi F-18 FDG was injected intravenously. Full-ring PET imaging was performed from the skull base to thigh after the radiotracer. CT data was obtained and used for attenuation correction and anatomic localization.  FASTING BLOOD GLUCOSE: Value: 130 mg/dl  COMPARISON: Chest CT from 05/05/2014. PET-CT from 08/11/2012.  FINDINGS: NECK  No hypermetabolic lymph nodes in the neck.  CHEST  2.6 cm right lower lobe nodule shows low level FDG uptake with SUV max = 2.1. There is a finger-like extension from the inferolateral aspect of the nodule at least a portion of which represents an adjacent vessel, but CT chest from 02/10/2013 showed no evidence for blood pool level enhancement as would be expected for an AVM or vascular malformation. This nodule has shown continued interval slow growth since the study of 08/11/2012 when it measured about 1.9 cm and demonstrated SUV max = 2.5. No other hypermetabolic lung nodule evident. No evidence for hypermetabolic lymphadenopathy in the chest.  Densely calcified nodal tissue is seen in the subcarinal station in left hilum.  ABDOMEN/PELVIS  No abnormal hypermetabolic activity within the liver, pancreas, adrenal glands, or spleen. No hypermetabolic lymph nodes in the abdomen or pelvis.  Densely calcified nodal tissue is seen in the gastrohepatic ligament and in the left retrocrural space. IVC filter is identified in-situ.  SKELETON  No focal hypermetabolic activity to suggest skeletal metastasis.  IMPRESSION: Continued growth of the right lower lobe pulmonary nodule, now measuring 2.6 mm in diameter. As before, the nodule has low level FDG accumulation, borderline for infectious/ inflammatory versus neoplastic.   Electronically Signed  By: Misty Stanley M.D.  On: 05/14/2014 10:44           Vitals     Height Weight BMI (Calculated)    '5\' 8"'$  (1.727 m) 229 lb (103.874 kg) 34.9      Interpretation  Summary     CLINICAL DATA: Subsequent treatment strategy for lung mass.  EXAM: NUCLEAR MEDICINE PET SKULL BASE TO THIGH  TECHNIQUE: Eleven point for mCi F-18 FDG was injected intravenously. Full-ring PET imaging was performed from the skull base to thigh after the radiotracer. CT data was obtained and used for attenuation correction and anatomic localization.  FASTING BLOOD GLUCOSE: Value: 130 mg/dl  COMPARISON: Chest CT from 05/05/2014. PET-CT from 08/11/2012.  FINDINGS: NECK  No hypermetabolic lymph nodes in the neck.  CHEST  2.6 cm right lower lobe nodule shows low level FDG uptake with SUV max = 2.1. There is a finger-like extension from the inferolateral aspect of the nodule at least a portion of which represents an adjacent vessel, but CT chest from 02/10/2013 showed no evidence for blood pool level enhancement as would be expected for an AVM or vascular malformation. This nodule has shown continued interval slow growth since the study of 08/11/2012 when it measured about 1.9 cm and demonstrated SUV max = 2.5. No other hypermetabolic lung nodule evident. No evidence for hypermetabolic lymphadenopathy in the chest.  Densely calcified nodal tissue is seen in the subcarinal station in left hilum.  ABDOMEN/PELVIS  No abnormal hypermetabolic activity within the liver, pancreas, adrenal glands, or spleen. No hypermetabolic lymph nodes in the abdomen or pelvis.  Densely calcified nodal tissue is seen in the gastrohepatic ligament and in the left retrocrural space. IVC filter is identified in-situ.  SKELETON  No focal hypermetabolic activity to suggest skeletal metastasis.  IMPRESSION: Continued growth of the right lower lobe pulmonary nodule, now measuring 2.6 mm in diameter. As before, the nodule has low level FDG accumulation, borderline for infectious/ inflammatory versus neoplastic.   Electronically Signed  By: Misty Stanley M.D.   On: 05/14/2014 10:44          Ref Range 3d ago     FVC-Pre L 2.85    FVC-%Pred-Pre % 81    FVC-Post L 2.90    FVC-%Pred-Post % 83    FVC-%Change-Post % 1    FEV1-Pre L 1.99    FEV1-%Pred-Pre % 74    FEV1-Post L 2.16    FEV1-%Pred-Post % 81    FEV1-%Change-Post % 8    FEV6-Pre L 2.80    FEV6-%Pred-Pre % 83    FEV6-Post L 2.90    FEV6-%Pred-Post % 86    FEV6-%Change-Post % 3    Pre FEV1/FVC ratio % 70    FEV1FVC-%Pred-Pre % 90    Post FEV1/FVC ratio % 75    FEV1FVC-%Change-Post % 7    Pre FEV6/FVC Ratio % 98    FEV6FVC-%Pred-Pre % 102    Post FEV6/FVC ratio % 100    FEV6FVC-%Pred-Post % 104    FEV6FVC-%Change-Post % 1    FEF 25-75 Pre L/sec 1.16    FEF2575-%Pred-Pre % 52    FEF 25-75 Post L/sec 1.81    FEF2575-%Pred-Post % 81    FEF2575-%Change-Post % 56    RV L 2.25    RV % pred % 99    TLC L 5.25    TLC % pred % 95    DLCO unc ml/min/mmHg 20.62    DLCO unc % pred % 72    DL/VA ml/min/mmHg/L 4.51    DL/VA % pred % 87   Resulting Agency  [facility]      Specimen Collected: 06/14/14 10:03 AM Last Resulted: 06/14/14 11:11 AM          Impression:  She has a 2.6  cm solitary mass in the RLL that is most likely a carcinoid tumor. Her PFT's are adequate to allow right lower lobectomy and the tumor is in the middle of the lobe so lobectomy would be needed. I reviewed the CT and PET scans with the patient and her daughter. I discussed the procedure of right lower lobectomy, alternatives, benefits and risks including but not limited to bleeding, infection, prolonged air leak, bronchial stump complications and recurrence of the tumor. I answered all of their questions and she understands and would like to proceed with surgery in early June.  Plan:  She will call the office to schedule right thoracotomy and right lower lobectomy for sometime in June after she discusses the timing with her daughter.  Gaye Pollack, MD [facility] [phone]

## 2014-06-21 ENCOUNTER — Other Ambulatory Visit: Payer: Self-pay | Admitting: *Deleted

## 2014-06-21 DIAGNOSIS — R911 Solitary pulmonary nodule: Secondary | ICD-10-CM

## 2014-06-25 ENCOUNTER — Telehealth: Payer: Self-pay | Admitting: Internal Medicine

## 2014-06-25 NOTE — Telephone Encounter (Signed)
Notes Recorded by Deneise Lever, MD on 06/08/2014 at 3:32 PM CBC- normal blood count Notes Recorded by Deneise Lever, MD on 06/08/2014 at 3:29 PM CXR- stable nodule, but nothing new to explain the right chest wall pain. Heating pad and ibuprofen may help. -----------------  Pt aware of the above results/recs.  Nothing further needed.

## 2014-06-25 NOTE — Progress Notes (Signed)
Quick Note:  LMTCB with pt's neighbor ______

## 2014-06-25 NOTE — Progress Notes (Signed)
Quick Note:  LMTCB with the pt's neighbor ______

## 2014-07-13 ENCOUNTER — Encounter (HOSPITAL_COMMUNITY): Payer: Self-pay

## 2014-07-13 ENCOUNTER — Ambulatory Visit (HOSPITAL_COMMUNITY)
Admission: RE | Admit: 2014-07-13 | Discharge: 2014-07-13 | Disposition: A | Discharge status: Home / Self Care | Payer: Medicare Other | Source: Ambulatory Visit | Attending: Surgery | Admitting: Surgery

## 2014-07-13 ENCOUNTER — Encounter (HOSPITAL_COMMUNITY)
Admission: RE | Admit: 2014-07-13 | Discharge: 2014-07-13 | Disposition: A | Discharge status: Home / Self Care | Payer: Medicare Other | Source: Ambulatory Visit | Attending: Surgery | Admitting: Surgery

## 2014-07-13 ENCOUNTER — Other Ambulatory Visit: Payer: Self-pay

## 2014-07-13 VITALS — BP 110/35 | HR 77 | Temp 98.7°F | Resp 20 | Ht 68.0 in | Wt 235.7 lb

## 2014-07-13 DIAGNOSIS — R911 Solitary pulmonary nodule: Secondary | ICD-10-CM

## 2014-07-13 HISTORY — DX: Personal history of urinary calculi: Z87.442

## 2014-07-13 HISTORY — DX: Chronic or unspecified peptic ulcer, site unspecified, with hemorrhage: K27.4

## 2014-07-13 LAB — BLOOD GAS, ARTERIAL
Acid-base deficit: 2.3 mmol/L — ABNORMAL HIGH (ref 0.0–2.0)
Bicarbonate: 21.7 mEq/L (ref 20.0–24.0)
Drawn by: 206361
FIO2: 0.21 %
O2 Saturation: 96.2 %
PH ART: 7.398 (ref 7.350–7.450)
PO2 ART: 81.8 mmHg (ref 80.0–100.0)
Patient temperature: 98.6
TCO2: 22.8 mmol/L (ref 0–100)
pCO2 arterial: 36 mmHg (ref 35.0–45.0)

## 2014-07-13 LAB — COMPREHENSIVE METABOLIC PANEL
ALT: 18 U/L (ref 14–54)
AST: 25 U/L (ref 15–41)
Albumin: 4.2 g/dL (ref 3.5–5.0)
Alkaline Phosphatase: 71 U/L (ref 38–126)
Anion gap: 12 (ref 5–15)
BILIRUBIN TOTAL: 0.4 mg/dL (ref 0.3–1.2)
BUN: 14 mg/dL (ref 6–20)
CO2: 20 mmol/L — ABNORMAL LOW (ref 22–32)
Calcium: 9 mg/dL (ref 8.9–10.3)
Chloride: 103 mmol/L (ref 101–111)
Creatinine, Ser: 0.79 mg/dL (ref 0.44–1.00)
GFR calc Af Amer: >60 mL/min (ref >60)
GFR calc non Af Amer: >60 mL/min (ref >60)
Glucose, Bld: 122 mg/dL — ABNORMAL HIGH (ref 65–99)
Potassium: 4.6 mmol/L (ref 3.5–5.1)
SODIUM: 135 mmol/L (ref 135–145)
Total Protein: 7 g/dL (ref 6.5–8.1)

## 2014-07-13 LAB — URINALYSIS, ROUTINE W REFLEX MICROSCOPIC
Glucose, UA: NEGATIVE mg/dL
Hgb urine dipstick: NEGATIVE
KETONES UR: 15 mg/dL — AB
NITRITE: NEGATIVE
PH: 5 (ref 5.0–8.0)
PROTEIN: NEGATIVE mg/dL
SPECIFIC GRAVITY, URINE: 1.035 — AB (ref 1.005–1.030)
UROBILINOGEN UA: 1 mg/dL (ref 0.0–1.0)

## 2014-07-13 LAB — PROTIME-INR
INR: 1.04 (ref 0.00–1.49)
Prothrombin Time: 13.8 seconds (ref 11.6–15.2)

## 2014-07-13 LAB — TYPE AND SCREEN
ABO/RH(D): O POS
ANTIBODY SCREEN: NEGATIVE

## 2014-07-13 LAB — CBC
HCT: 39 % (ref 36.0–46.0)
Hemoglobin: 12.5 g/dL (ref 12.0–15.0)
MCH: 27.5 pg (ref 26.0–34.0)
MCHC: 32.1 g/dL (ref 30.0–36.0)
MCV: 85.7 fL (ref 78.0–100.0)
Platelets: 247 10*3/uL (ref 150–400)
RBC: 4.55 MIL/uL (ref 3.87–5.11)
RDW: 13.1 % (ref 11.5–15.5)
WBC: 8 10*3/uL (ref 4.0–10.5)

## 2014-07-13 LAB — URINE MICROSCOPIC-ADD ON

## 2014-07-13 LAB — APTT: aPTT: 32 seconds (ref 24–37)

## 2014-07-13 LAB — SURGICAL PCR SCREEN
MRSA, PCR: NEGATIVE
Staphylococcus aureus: NEGATIVE

## 2014-07-13 NOTE — Pre-Procedure Instructions (Signed)
Danajah A Martinique  07/13/2014     Your procedure is scheduled on: Thursday July 15, 2014 at 7:30 AM.  Report to [facility] Admitting at 5:30 A.M.  Call this number if you have problems the morning of surgery: [phone]   Remember:  Do not eat food or drink liquids after midnight.  Take these medicines the morning of surgery with A SIP OF WATER: Breo inhaler, Combivent inhaler if needed, and Metoprolol (Toprol XL), Duloxetine (Cymbalta), Lorazepam (Ativan) if needed, Meclizine (Antivert) if needed, Lyrica, Carbamazepine (Tegretol), and Tylenol if needed   Please stop taking any vitamins, herbal medications, etc   Do not wear jewelry, make-up or nail polish.  Do not wear lotions, powders, or perfumes.    Do not shave 48 hours prior to surgery.    Do not bring valuables to the hospital.  [facility] is not responsible for any belongings or valuables.  Contacts, dentures or bridgework may not be worn into surgery.  Leave your suitcase in the car.  After surgery it may be brought to your room.  For patients admitted to the hospital, discharge time will be determined by your treatment team.  Patients discharged the day of surgery will not be allowed to drive home.   Name and phone number of your driver:    Special instructions: Shower using CHG soap the night before and the morning of your surgery  Please read over the following fact sheets that you were given. Pain Booklet, Coughing and Deep Breathing, Blood Transfusion Information, MRSA Information and Surgical Site Infection Prevention

## 2014-07-13 NOTE — Progress Notes (Signed)
Patient denied having any acute cardiac issues, but did inform Nurse that she has had some shortness of breath which was the reason she was having surgery on Thursday. Patient denied having any current shortness of breath at this time. PCP is Arlyss Repress, and Pulmonologist is [facility] Patient informed Nurse that she had a stress test at [facility] in [facility] with Dr. Ivar Bury approximately 4 years ago, and patient had a cardiac cath in 1992 but stated it was only "stress related." Will request records from [facility].   Patients son Harrell Gave at chair side during PAT visit.

## 2014-07-14 MED ORDER — DEXTROSE 5 % IV SOLN
1.5000 g | INTRAVENOUS | Status: AC
  Administered 2014-07-15: 1.5 g via INTRAVENOUS
  Filled 2014-07-14: qty 1.5

## 2014-07-15 ENCOUNTER — Encounter (HOSPITAL_COMMUNITY): Payer: Self-pay | Admitting: Critical Care Medicine

## 2014-07-15 ENCOUNTER — Inpatient Hospital Stay (HOSPITAL_COMMUNITY)
Admission: RE | Admit: 2014-07-15 | Discharge: 2014-07-20 | DRG: 164 | Disposition: A | Discharge status: Home Health | Payer: Medicare Other | Source: Ambulatory Visit | Attending: Surgery | Admitting: Surgery

## 2014-07-15 ENCOUNTER — Encounter (HOSPITAL_COMMUNITY)
Admission: RE | Disposition: A | Discharge status: Home Health | Payer: Self-pay | Source: Ambulatory Visit | Attending: Surgery

## 2014-07-15 ENCOUNTER — Inpatient Hospital Stay (HOSPITAL_COMMUNITY): Payer: Medicare Other | Admitting: Certified Registered Nurse Anesthetist

## 2014-07-15 ENCOUNTER — Inpatient Hospital Stay (HOSPITAL_COMMUNITY): Payer: Medicare Other

## 2014-07-15 DIAGNOSIS — D3A00 Benign carcinoid tumor of unspecified site: Secondary | ICD-10-CM

## 2014-07-15 DIAGNOSIS — Z902 Acquired absence of lung [part of]: Secondary | ICD-10-CM

## 2014-07-15 DIAGNOSIS — D62 Acute posthemorrhagic anemia: Secondary | ICD-10-CM | POA: Diagnosis not present

## 2014-07-15 DIAGNOSIS — Z86711 Personal history of pulmonary embolism: Secondary | ICD-10-CM

## 2014-07-15 DIAGNOSIS — J9382 Other air leak: Secondary | ICD-10-CM | POA: Diagnosis not present

## 2014-07-15 DIAGNOSIS — M797 Fibromyalgia: Secondary | ICD-10-CM | POA: Diagnosis present

## 2014-07-15 DIAGNOSIS — Z8711 Personal history of peptic ulcer disease: Secondary | ICD-10-CM | POA: Diagnosis not present

## 2014-07-15 DIAGNOSIS — Z6835 Body mass index (BMI) 35.0-35.9, adult: Secondary | ICD-10-CM | POA: Diagnosis not present

## 2014-07-15 DIAGNOSIS — F419 Anxiety disorder, unspecified: Secondary | ICD-10-CM | POA: Diagnosis present

## 2014-07-15 DIAGNOSIS — F4310 Post-traumatic stress disorder, unspecified: Secondary | ICD-10-CM | POA: Diagnosis present

## 2014-07-15 DIAGNOSIS — Z86010 Personal history of colonic polyps: Secondary | ICD-10-CM

## 2014-07-15 DIAGNOSIS — Z9689 Presence of other specified functional implants: Secondary | ICD-10-CM

## 2014-07-15 DIAGNOSIS — I10 Essential (primary) hypertension: Secondary | ICD-10-CM | POA: Diagnosis present

## 2014-07-15 DIAGNOSIS — Z87891 Personal history of nicotine dependence: Secondary | ICD-10-CM

## 2014-07-15 DIAGNOSIS — T797XXA Traumatic subcutaneous emphysema, initial encounter: Secondary | ICD-10-CM | POA: Diagnosis not present

## 2014-07-15 DIAGNOSIS — E785 Hyperlipidemia, unspecified: Secondary | ICD-10-CM | POA: Diagnosis present

## 2014-07-15 DIAGNOSIS — D3A090 Benign carcinoid tumor of the bronchus and lung: Principal | ICD-10-CM | POA: Diagnosis present

## 2014-07-15 DIAGNOSIS — J449 Chronic obstructive pulmonary disease, unspecified: Secondary | ICD-10-CM | POA: Diagnosis present

## 2014-07-15 DIAGNOSIS — R911 Solitary pulmonary nodule: Secondary | ICD-10-CM

## 2014-07-15 DIAGNOSIS — Z96653 Presence of artificial knee joint, bilateral: Secondary | ICD-10-CM | POA: Diagnosis present

## 2014-07-15 DIAGNOSIS — C3431 Malignant neoplasm of lower lobe, right bronchus or lung: Secondary | ICD-10-CM

## 2014-07-15 HISTORY — PX: THORACOTOMY/LOBECTOMY: SHX6116

## 2014-07-15 LAB — GLUCOSE, CAPILLARY
GLUCOSE-CAPILLARY: 152 mg/dL — AB (ref 65–99)
Glucose-Capillary: 133 mg/dL — ABNORMAL HIGH (ref 65–99)
Glucose-Capillary: 150 mg/dL — ABNORMAL HIGH (ref 65–99)
Glucose-Capillary: 160 mg/dL — ABNORMAL HIGH (ref 65–99)

## 2014-07-15 SURGERY — LOBECTOMY, LUNG, OPEN
Anesthesia: General | Site: Chest | Laterality: Right

## 2014-07-15 MED ORDER — IPRATROPIUM-ALBUTEROL 0.5-2.5 (3) MG/3ML IN SOLN
3.0000 mL | Freq: Four times a day (QID) | RESPIRATORY_TRACT | Status: DC | PRN
  Administered 2014-07-15 – 2014-07-17 (×6): 3 mL via RESPIRATORY_TRACT
  Filled 2014-07-15 (×5): qty 3

## 2014-07-15 MED ORDER — ROCURONIUM BROMIDE 50 MG/5ML IV SOLN
INTRAVENOUS | Status: AC
  Filled 2014-07-15: qty 1

## 2014-07-15 MED ORDER — PROPOFOL 10 MG/ML IV BOLUS
INTRAVENOUS | Status: DC | PRN
  Administered 2014-07-15: 100 mg via INTRAVENOUS
  Administered 2014-07-15: 60 mg via INTRAVENOUS

## 2014-07-15 MED ORDER — PREGABALIN 75 MG PO CAPS
75.0000 mg | ORAL_CAPSULE | Freq: Two times a day (BID) | ORAL | Status: DC
  Administered 2014-07-15 – 2014-07-20 (×11): 75 mg via ORAL
  Filled 2014-07-15 (×6): qty 3
  Filled 2014-07-15: qty 1
  Filled 2014-07-15 (×3): qty 3
  Filled 2014-07-15: qty 1

## 2014-07-15 MED ORDER — LUNG SURGERY BOOK
Freq: Once | Status: AC
  Administered 2014-07-15: 14:00:00
  Filled 2014-07-15: qty 1

## 2014-07-15 MED ORDER — GLYCOPYRROLATE 0.2 MG/ML IJ SOLN
INTRAMUSCULAR | Status: DC | PRN
  Administered 2014-07-15: .8 mg via INTRAVENOUS

## 2014-07-15 MED ORDER — ONDANSETRON HCL 4 MG/2ML IJ SOLN: 4.0000 mg | Freq: Four times a day (QID) | INTRAMUSCULAR | Status: DC | PRN

## 2014-07-15 MED ORDER — BISACODYL 5 MG PO TBEC
10.0000 mg | DELAYED_RELEASE_TABLET | Freq: Every day | ORAL | Status: DC
  Administered 2014-07-15 – 2014-07-19 (×4): 10 mg via ORAL
  Filled 2014-07-15 (×4): qty 2

## 2014-07-15 MED ORDER — FENTANYL CITRATE (PF) 250 MCG/5ML IJ SOLN
INTRAMUSCULAR | Status: AC
  Filled 2014-07-15: qty 5

## 2014-07-15 MED ORDER — BUPIVACAINE HCL 0.5 % IJ SOLN
INTRAMUSCULAR | Status: DC | PRN
  Administered 2014-07-15: 10 mL

## 2014-07-15 MED ORDER — LACTATED RINGERS IV SOLN
INTRAVENOUS | Status: DC | PRN
  Administered 2014-07-15: 07:00:00 via INTRAVENOUS

## 2014-07-15 MED ORDER — ACETAMINOPHEN 500 MG PO TABS
1000.0000 mg | ORAL_TABLET | Freq: Four times a day (QID) | ORAL | Status: DC
  Administered 2014-07-15 – 2014-07-19 (×15): 1000 mg via ORAL
  Filled 2014-07-15 (×21): qty 2

## 2014-07-15 MED ORDER — LORAZEPAM 1 MG PO TABS
2.0000 mg | ORAL_TABLET | Freq: Three times a day (TID) | ORAL | Status: DC
  Administered 2014-07-15 – 2014-07-20 (×15): 2 mg via ORAL
  Filled 2014-07-15 (×15): qty 2

## 2014-07-15 MED ORDER — LIDOCAINE HCL (CARDIAC) 20 MG/ML IV SOLN
INTRAVENOUS | Status: AC
  Filled 2014-07-15: qty 5

## 2014-07-15 MED ORDER — PHENYLEPHRINE HCL 10 MG/ML IJ SOLN
INTRAMUSCULAR | Status: DC | PRN
  Administered 2014-07-15 (×4): 40 ug via INTRAVENOUS

## 2014-07-15 MED ORDER — CETYLPYRIDINIUM CHLORIDE 0.05 % MT LIQD
7.0000 mL | Freq: Two times a day (BID) | OROMUCOSAL | Status: DC
  Administered 2014-07-15 – 2014-07-19 (×8): 7 mL via OROMUCOSAL

## 2014-07-15 MED ORDER — EPHEDRINE SULFATE 50 MG/ML IJ SOLN
INTRAMUSCULAR | Status: AC
  Filled 2014-07-15: qty 1

## 2014-07-15 MED ORDER — 0.9 % SODIUM CHLORIDE (POUR BTL) OPTIME
TOPICAL | Status: DC | PRN
  Administered 2014-07-15: 2000 mL

## 2014-07-15 MED ORDER — MIDAZOLAM HCL 2 MG/2ML IJ SOLN
INTRAMUSCULAR | Status: AC
  Filled 2014-07-15: qty 2

## 2014-07-15 MED ORDER — KETAMINE HCL 10 MG/ML IJ SOLN
INTRAMUSCULAR | Status: DC | PRN
  Administered 2014-07-15: 10 mg via INTRAVENOUS
  Administered 2014-07-15: 20 mg via INTRAVENOUS

## 2014-07-15 MED ORDER — ONDANSETRON HCL 4 MG/2ML IJ SOLN
INTRAMUSCULAR | Status: DC | PRN
  Administered 2014-07-15: 4 mg via INTRAVENOUS

## 2014-07-15 MED ORDER — INSULIN ASPART 100 UNIT/ML ~~LOC~~ SOLN
0.0000 [IU] | SUBCUTANEOUS | Status: DC
  Administered 2014-07-15 – 2014-07-16 (×5): 2 [IU] via SUBCUTANEOUS

## 2014-07-15 MED ORDER — DEXAMETHASONE SODIUM PHOSPHATE 10 MG/ML IJ SOLN
INTRAMUSCULAR | Status: AC
  Filled 2014-07-15: qty 1

## 2014-07-15 MED ORDER — NEOSTIGMINE METHYLSULFATE 10 MG/10ML IV SOLN
INTRAVENOUS | Status: AC
  Filled 2014-07-15: qty 1

## 2014-07-15 MED ORDER — HYDROMORPHONE HCL 1 MG/ML IJ SOLN
INTRAMUSCULAR | Status: AC
  Filled 2014-07-15: qty 1

## 2014-07-15 MED ORDER — PROPOFOL 10 MG/ML IV BOLUS
INTRAVENOUS | Status: AC
  Filled 2014-07-15: qty 20

## 2014-07-15 MED ORDER — FENTANYL 10 MCG/ML IV SOLN
INTRAVENOUS | Status: DC
  Administered 2014-07-15: 120 ug via INTRAVENOUS
  Administered 2014-07-15 (×2): via INTRAVENOUS
  Administered 2014-07-15: 165 ug via INTRAVENOUS
  Administered 2014-07-15: 60 ug via INTRAVENOUS
  Administered 2014-07-16: 225 ug via INTRAVENOUS
  Administered 2014-07-16: 235 ug via INTRAVENOUS
  Administered 2014-07-16: 150 ug via INTRAVENOUS
  Filled 2014-07-15 (×3): qty 50

## 2014-07-15 MED ORDER — DEXMEDETOMIDINE HCL IN NACL 400 MCG/100ML IV SOLN
0.4000 ug/kg/h | INTRAVENOUS | Status: AC
  Administered 2014-07-15: 0.7 ug/kg/h via INTRAVENOUS
  Filled 2014-07-15: qty 100

## 2014-07-15 MED ORDER — GLYCOPYRROLATE 0.2 MG/ML IJ SOLN
INTRAMUSCULAR | Status: AC
  Filled 2014-07-15: qty 4

## 2014-07-15 MED ORDER — NEOSTIGMINE METHYLSULFATE 10 MG/10ML IV SOLN
INTRAVENOUS | Status: DC | PRN
  Administered 2014-07-15: 5 mg via INTRAVENOUS

## 2014-07-15 MED ORDER — POTASSIUM CHLORIDE 10 MEQ/50ML IV SOLN: 10.0000 meq | Freq: Every day | INTRAVENOUS | Status: DC | PRN

## 2014-07-15 MED ORDER — NALOXONE HCL 0.4 MG/ML IJ SOLN: 0.4000 mg | INTRAMUSCULAR | Status: DC | PRN

## 2014-07-15 MED ORDER — BUPIVACAINE 0.5 % ON-Q PUMP SINGLE CATH 400 ML
400.0000 mL | INJECTION | Status: DC
  Administered 2014-07-15: 400 mL
  Filled 2014-07-15: qty 400

## 2014-07-15 MED ORDER — METOCLOPRAMIDE HCL 5 MG/ML IJ SOLN
10.0000 mg | Freq: Four times a day (QID) | INTRAMUSCULAR | Status: DC
  Administered 2014-07-15 – 2014-07-19 (×15): 10 mg via INTRAVENOUS
  Filled 2014-07-15 (×20): qty 2

## 2014-07-15 MED ORDER — IPRATROPIUM-ALBUTEROL 20-100 MCG/ACT IN AERS: 2.0000 | INHALATION_SPRAY | Freq: Four times a day (QID) | RESPIRATORY_TRACT | Status: DC | PRN

## 2014-07-15 MED ORDER — BUPIVACAINE HCL (PF) 0.5 % IJ SOLN
INTRAMUSCULAR | Status: AC
  Filled 2014-07-15: qty 10

## 2014-07-15 MED ORDER — MIDAZOLAM HCL 5 MG/5ML IJ SOLN
INTRAMUSCULAR | Status: DC | PRN
  Administered 2014-07-15 (×2): 2 mg via INTRAVENOUS

## 2014-07-15 MED ORDER — DEXMEDETOMIDINE HCL IN NACL 200 MCG/50ML IV SOLN
INTRAVENOUS | Status: DC | PRN
  Administered 2014-07-15: 0.7 ug/kg/h via INTRAVENOUS

## 2014-07-15 MED ORDER — DIPHENHYDRAMINE HCL 12.5 MG/5ML PO ELIX
12.5000 mg | ORAL_SOLUTION | Freq: Four times a day (QID) | ORAL | Status: DC | PRN
  Filled 2014-07-15: qty 5

## 2014-07-15 MED ORDER — HYDROXYCHLOROQUINE SULFATE 200 MG PO TABS
200.0000 mg | ORAL_TABLET | Freq: Two times a day (BID) | ORAL | Status: DC
  Administered 2014-07-15 – 2014-07-20 (×11): 200 mg via ORAL
  Filled 2014-07-15 (×13): qty 1

## 2014-07-15 MED ORDER — KCL IN DEXTROSE-NACL 20-5-0.45 MEQ/L-%-% IV SOLN
INTRAVENOUS | Status: DC
  Administered 2014-07-15 – 2014-07-16 (×2): via INTRAVENOUS
  Administered 2014-07-16: 50 mL/h via INTRAVENOUS
  Administered 2014-07-17 – 2014-07-19 (×3): via INTRAVENOUS
  Filled 2014-07-15 (×8): qty 1000

## 2014-07-15 MED ORDER — DEXAMETHASONE SODIUM PHOSPHATE 10 MG/ML IJ SOLN
INTRAMUSCULAR | Status: DC | PRN
  Administered 2014-07-15: 10 mg via INTRAVENOUS

## 2014-07-15 MED ORDER — CARBAMAZEPINE ER 200 MG PO TB12
200.0000 mg | ORAL_TABLET | Freq: Two times a day (BID) | ORAL | Status: DC
  Administered 2014-07-15 – 2014-07-20 (×11): 200 mg via ORAL
  Filled 2014-07-15 (×13): qty 1

## 2014-07-15 MED ORDER — SODIUM CHLORIDE 0.9 % IJ SOLN: 9.0000 mL | INTRAMUSCULAR | Status: DC | PRN

## 2014-07-15 MED ORDER — DIPHENHYDRAMINE HCL 50 MG/ML IJ SOLN: 12.5000 mg | Freq: Four times a day (QID) | INTRAMUSCULAR | Status: DC | PRN

## 2014-07-15 MED ORDER — PHENYLEPHRINE 40 MCG/ML (10ML) SYRINGE FOR IV PUSH (FOR BLOOD PRESSURE SUPPORT)
PREFILLED_SYRINGE | INTRAVENOUS | Status: AC
  Filled 2014-07-15: qty 10

## 2014-07-15 MED ORDER — MECLIZINE HCL 25 MG PO TABS
25.0000 mg | ORAL_TABLET | Freq: Three times a day (TID) | ORAL | Status: DC | PRN
  Filled 2014-07-15: qty 1

## 2014-07-15 MED ORDER — HYDROXYZINE HCL 25 MG PO TABS
25.0000 mg | ORAL_TABLET | Freq: Three times a day (TID) | ORAL | Status: DC | PRN
Start: 2014-07-15 — End: 2014-07-16
  Filled 2014-07-15: qty 1

## 2014-07-15 MED ORDER — FENTANYL CITRATE (PF) 100 MCG/2ML IJ SOLN
INTRAMUSCULAR | Status: DC | PRN
  Administered 2014-07-15 (×3): 50 ug via INTRAVENOUS
  Administered 2014-07-15: 100 ug via INTRAVENOUS
  Administered 2014-07-15: 50 ug via INTRAVENOUS
  Administered 2014-07-15: 100 ug via INTRAVENOUS
  Administered 2014-07-15 (×4): 50 ug via INTRAVENOUS

## 2014-07-15 MED ORDER — DEXTROSE 5 % IV SOLN
1.5000 g | Freq: Two times a day (BID) | INTRAVENOUS | Status: AC
  Administered 2014-07-15 – 2014-07-16 (×2): 1.5 g via INTRAVENOUS
  Filled 2014-07-15 (×2): qty 1.5

## 2014-07-15 MED ORDER — SUCCINYLCHOLINE CHLORIDE 20 MG/ML IJ SOLN
INTRAMUSCULAR | Status: AC
  Filled 2014-07-15: qty 1

## 2014-07-15 MED ORDER — HEMOSTATIC AGENTS (NO CHARGE) OPTIME
TOPICAL | Status: DC | PRN
  Administered 2014-07-15: 1 via TOPICAL

## 2014-07-15 MED ORDER — SODIUM CHLORIDE 0.9 % IJ SOLN
INTRAMUSCULAR | Status: AC
  Filled 2014-07-15: qty 10

## 2014-07-15 MED ORDER — KCL IN DEXTROSE-NACL 20-5-0.45 MEQ/L-%-% IV SOLN
INTRAVENOUS | Status: AC
  Filled 2014-07-15: qty 1000

## 2014-07-15 MED ORDER — ROCURONIUM BROMIDE 100 MG/10ML IV SOLN
INTRAVENOUS | Status: DC | PRN
  Administered 2014-07-15: 20 mg via INTRAVENOUS
  Administered 2014-07-15 (×2): 10 mg via INTRAVENOUS
  Administered 2014-07-15: 50 mg via INTRAVENOUS
  Administered 2014-07-15 (×3): 10 mg via INTRAVENOUS

## 2014-07-15 MED ORDER — DULOXETINE HCL 60 MG PO CPEP
120.0000 mg | ORAL_CAPSULE | Freq: Every day | ORAL | Status: DC
  Administered 2014-07-15 – 2014-07-20 (×6): 120 mg via ORAL
  Filled 2014-07-15 (×6): qty 2

## 2014-07-15 MED ORDER — KETAMINE HCL 100 MG/ML IJ SOLN
INTRAMUSCULAR | Status: AC
  Filled 2014-07-15: qty 1

## 2014-07-15 MED ORDER — ACETAMINOPHEN 160 MG/5ML PO SOLN
1000.0000 mg | Freq: Four times a day (QID) | ORAL | Status: DC
  Filled 2014-07-15: qty 40

## 2014-07-15 MED ORDER — SENNOSIDES-DOCUSATE SODIUM 8.6-50 MG PO TABS
1.0000 | ORAL_TABLET | Freq: Every day | ORAL | Status: DC
  Administered 2014-07-15 – 2014-07-17 (×3): 1 via ORAL
  Filled 2014-07-15 (×6): qty 1

## 2014-07-15 MED ORDER — HYDROMORPHONE HCL 1 MG/ML IJ SOLN: 0.2500 mg | INTRAMUSCULAR | Status: DC | PRN

## 2014-07-15 SURGICAL SUPPLY — 79 items
BENZOIN TINCTURE PRP APPL 2/3 (GAUZE/BANDAGES/DRESSINGS) ×3 IMPLANT
BIT DRILL 7/64X5 DISP (BIT) ×3 IMPLANT
BLADE SURG 11 STRL SS (BLADE) ×3 IMPLANT
CANISTER SUCTION 2500CC (MISCELLANEOUS) ×6 IMPLANT
CATH KIT ON Q 5IN SLV (PAIN MANAGEMENT) ×3 IMPLANT
CATH THORACIC 28FR (CATHETERS) ×6 IMPLANT
CATH THORACIC 36FR (CATHETERS) IMPLANT
CATH THORACIC 36FR RT ANG (CATHETERS) IMPLANT
CLIP TI MEDIUM 24 (CLIP) ×3 IMPLANT
CLOSURE STERI-STRIP 1/2X4 (GAUZE/BANDAGES/DRESSINGS) ×1
CLSR STERI-STRIP ANTIMIC 1/2X4 (GAUZE/BANDAGES/DRESSINGS) ×2 IMPLANT
CONN ST 1/4X3/8  BEN (MISCELLANEOUS) ×2
CONN ST 1/4X3/8 BEN (MISCELLANEOUS) ×1 IMPLANT
CONT SPEC 4OZ CLIKSEAL STRL BL (MISCELLANEOUS) ×6 IMPLANT
COVER SURGICAL LIGHT HANDLE (MISCELLANEOUS) ×6 IMPLANT
DERMABOND ADVANCED (GAUZE/BANDAGES/DRESSINGS)
DERMABOND ADVANCED .7 DNX12 (GAUZE/BANDAGES/DRESSINGS) IMPLANT
DRAPE LAPAROSCOPIC ABDOMINAL (DRAPES) ×3 IMPLANT
DRAPE WARM FLUID 44X44 (DRAPE) IMPLANT
DRSG TEGADERM 4X4.75 (GAUZE/BANDAGES/DRESSINGS) ×3 IMPLANT
ELECT BLADE 6.5 EXT (BLADE) ×3 IMPLANT
ELECT REM PT RETURN 9FT ADLT (ELECTROSURGICAL) ×3
ELECTRODE REM PT RTRN 9FT ADLT (ELECTROSURGICAL) ×1 IMPLANT
GAUZE SPONGE 4X4 12PLY STRL (GAUZE/BANDAGES/DRESSINGS) ×3 IMPLANT
GLOVE BIO SURGEON STRL SZ 6 (GLOVE) ×3 IMPLANT
GLOVE BIOGEL PI IND STRL 6.5 (GLOVE) ×2 IMPLANT
GLOVE BIOGEL PI INDICATOR 6.5 (GLOVE) ×4
GLOVE EUDERMIC 7 POWDERFREE (GLOVE) ×6 IMPLANT
GLOVE SURG SS PI 7.0 STRL IVOR (GLOVE) ×3 IMPLANT
GOWN STRL REUS W/ TWL LRG LVL3 (GOWN DISPOSABLE) ×2 IMPLANT
GOWN STRL REUS W/ TWL XL LVL3 (GOWN DISPOSABLE) ×2 IMPLANT
GOWN STRL REUS W/TWL LRG LVL3 (GOWN DISPOSABLE) ×4
GOWN STRL REUS W/TWL XL LVL3 (GOWN DISPOSABLE) ×4
HEMOSTAT SURGICEL 2X14 (HEMOSTASIS) ×3 IMPLANT
KIT BASIN OR (CUSTOM PROCEDURE TRAY) ×3 IMPLANT
KIT ROOM TURNOVER OR (KITS) ×3 IMPLANT
LIQUID BAND (GAUZE/BANDAGES/DRESSINGS) ×3 IMPLANT
NS IRRIG 1000ML POUR BTL (IV SOLUTION) ×9 IMPLANT
PACK CHEST (CUSTOM PROCEDURE TRAY) ×3 IMPLANT
PAD ARMBOARD 7.5X6 YLW CONV (MISCELLANEOUS) ×6 IMPLANT
RELOAD GOLD (STAPLE) ×6 IMPLANT
SEALANT SURG COSEAL 4ML (VASCULAR PRODUCTS) IMPLANT
SEALANT SURG COSEAL 8ML (VASCULAR PRODUCTS) IMPLANT
SOLUTION ANTI FOG 6CC (MISCELLANEOUS) IMPLANT
SPECIMEN JAR MEDIUM (MISCELLANEOUS) ×3 IMPLANT
SPONGE GAUZE 4X4 12PLY STER LF (GAUZE/BANDAGES/DRESSINGS) ×3 IMPLANT
STAPLE ECHEON FLEX 60 POW ENDO (STAPLE) ×3 IMPLANT
STAPLE RELOAD 2.5MM WHITE (STAPLE) ×9 IMPLANT
STAPLER TA30 4.8 NON-ABS (STAPLE) ×3 IMPLANT
STAPLER VASCULAR ECHELON 35 (CUTTER) ×3 IMPLANT
SUT PROLENE 3 0 SH DA (SUTURE) IMPLANT
SUT PROLENE 4 0 RB 1 (SUTURE)
SUT PROLENE 4-0 RB1 .5 CRCL 36 (SUTURE) IMPLANT
SUT SILK  1 MH (SUTURE) ×4
SUT SILK 1 MH (SUTURE) ×2 IMPLANT
SUT SILK 1 TIES 10X30 (SUTURE) ×3 IMPLANT
SUT SILK 2 0SH CR/8 30 (SUTURE) IMPLANT
SUT SILK 3 0 SH CR/8 (SUTURE) IMPLANT
SUT VIC AB 1 CTX 36 (SUTURE) ×2
SUT VIC AB 1 CTX36XBRD ANBCTR (SUTURE) ×1 IMPLANT
SUT VIC AB 2 TP1 27 (SUTURE) ×3 IMPLANT
SUT VIC AB 2-0 CT1 27 (SUTURE)
SUT VIC AB 2-0 CT1 TAPERPNT 27 (SUTURE) IMPLANT
SUT VIC AB 2-0 CTX 36 (SUTURE) ×3 IMPLANT
SUT VIC AB 2-0 UR6 27 (SUTURE) IMPLANT
SUT VIC AB 3-0 MH 27 (SUTURE) IMPLANT
SUT VIC AB 3-0 SH 27 (SUTURE)
SUT VIC AB 3-0 SH 27X BRD (SUTURE) IMPLANT
SUT VIC AB 3-0 X1 27 (SUTURE) ×6 IMPLANT
SYSTEM SAHARA CHEST DRAIN ATS (WOUND CARE) ×3 IMPLANT
TAPE CLOTH SURG 4X10 WHT LF (GAUZE/BANDAGES/DRESSINGS) ×3 IMPLANT
TAPE UMBILICAL 1/8 X36 TWILL (MISCELLANEOUS) IMPLANT
TIP APPLICATOR SPRAY EXTEND 16 (VASCULAR PRODUCTS) IMPLANT
TOWEL OR 17X24 6PK STRL BLUE (TOWEL DISPOSABLE) ×3 IMPLANT
TOWEL OR 17X26 10 PK STRL BLUE (TOWEL DISPOSABLE) ×3 IMPLANT
TRAP SPECIMEN MUCOUS 40CC (MISCELLANEOUS) IMPLANT
TRAY FOLEY CATH 16FRSI W/METER (SET/KITS/TRAYS/PACK) ×3 IMPLANT
TUNNELER SHEATH ON-Q 11GX8 DSP (PAIN MANAGEMENT) ×3 IMPLANT
WATER STERILE IRR 1000ML POUR (IV SOLUTION) ×3 IMPLANT

## 2014-07-15 NOTE — Transfer of Care (Signed)
Immediate Anesthesia Transfer of Care Note  Patient: Breanna Dixon  Procedure(s) Performed: Procedure(s): RIGHT THORACOTOMY/RIGHT LOWER LOBE  LOBECTOMY (Right)  Patient Location: PACU  Anesthesia Type:General  Level of Consciousness: sedated  Airway & Oxygen Therapy: Patient Spontanous Breathing and Patient connected to face mask oxygen  Post-op Assessment: Report given to RN, Post -op Vital signs reviewed and stable and Patient moving all extremities X 4  Post vital signs: Reviewed and stable  Last Vitals:  Filed Vitals:   07/15/14 0555  BP: 131/34  Pulse: 81  Temp: 37 C  Resp: 20  BP 98/42, HR 64, RR 12, Sats 16% on 10R FM  Complications: No apparent anesthesia complications

## 2014-07-15 NOTE — Anesthesia Postprocedure Evaluation (Signed)
  Anesthesia Post-op Note  Patient: Breanna Dixon  Procedure(s) Performed: Procedure(s): RIGHT THORACOTOMY/RIGHT LOWER LOBE  LOBECTOMY (Right)  Patient Location: PACU  Anesthesia Type:General  Level of Consciousness: awake and alert   Airway and Oxygen Therapy: Patient Spontanous Breathing  Post-op Pain: none  Post-op Assessment: Post-op Vital signs reviewed, Patient's Cardiovascular Status Stable and Respiratory Function Stable  Post-op Vital Signs: Reviewed  Filed Vitals:   07/15/14 1100  BP: 94/58  Pulse: 65  Temp:   Resp: 17    Complications: No apparent anesthesia complications

## 2014-07-15 NOTE — Interval H&P Note (Signed)
History and Physical Interval Note:  07/15/2014 7:22 AM  Breanna Dixon  has presented today for surgery, with the diagnosis of RIGHT LUNG NODULE  The various methods of treatment have been discussed with the patient and family. After consideration of risks, benefits and other options for treatment, the patient has consented to  Procedure(s): RIGHT THORACOTOMY/RIGHT LOWER LOBECTOMY (Right) as a surgical intervention .  The patient's history has been reviewed, patient examined, no change in status, stable for surgery.  I have reviewed the patient's chart and labs.  Questions were answered to the patient's satisfaction.     Gaye Pollack

## 2014-07-15 NOTE — H&P (Signed)
Breanna Dixon       [place] [address]             [phone]      Cardiothoracic Surgery History and Physical    PCP is Arlyss Repress, MD Referring Provider is Deneise Lever, MD  Chief Complaint  Patient presents with  . Lung Cancer    neuroendocirne right lower lobe, CT CHEST/ PET/PFT'S    HPI:  The patient is a 69 year old former heavy smoker with a history of fibromyalgia, Lupus, PTSD, anxiety disorder, PE with what sounds like shock in 2006. She underwent bilateral total knee replacements in 05/2011 and 10/2012 and a CXR at that time showed a RLL nodule. A CT scan on 05/30/2011 showed a 17 mm x 16 mm RLL nodule. A PET scan was done at [facility] and the report showed a 16 x 18 mm nodule with an SUV of 1.4. This lesion as been followed with serial scans and a CT of the chest on 05/05/2014 showed that it had continued to grow and was now 2.6 cm. A PET scan on 05/14/2014 showed low level activity with an SUV of 2.1. There were no other areas of hyperactivity. She underwent a bronch and ENB by Dr. Lamonte Sakai on 06/02/2014 and biopsy showed probable low grade neuroendocrine tumor ( Carcinoid) although there was only a very limited amount of tumor in the sample.   Past Medical History  Diagnosis Date  . Cancer     breast hx  . Seasonal allergies     flonase prn  . Lung mass April 2013    Right lower lobe lung mass  . Arthritis   . Pulmonary embolism 2006    IVC filter  . PTSD (post-traumatic stress disorder)     takes Cymbalta daily  . Anxiety     takes Ativan daily  . Fibromyalgia     takes Lyrica daily  . History of bronchitis   . Hyperlipidemia     takes Fenofibrate daily  . COPD (chronic obstructive pulmonary disease)   . Pneumonia     hx of  . Headache(784.0)     takes Tegretol and Verapamil nightly  . Hypertension     takes Toprol daily  .  Hypertension   . Vertigo     takes Antivert prn  . Lupus     takes Cymbalta,Plaquenil daily  . Joint pain   . History of ulcer disease     pyloric   . History of colon polyps     benign  . History of blood transfusion     no abnormal reaction noted    Past Surgical History  Procedure Laterality Date  . Cholecystectomy  1994  . Back surgery  2000/2010    fusion  . Tonsillectomy  1967  . Appendectomy  1965  . Cystoscopy      multiple  . Urethral dilation  1971  . Breast surgery      bilateral partial masectomy  . Abdominal hysterectomy  1992  . Dilation and curettage of uterus    . Hernia repair    . Colonoscopy    . Knee arthroscopy Bilateral   . Total knee arthroplasty  06/04/2011    Procedure: TOTAL KNEE ARTHROPLASTY; Surgeon: Rudean Haskell, MD; Location: [place]; Service: Orthopedics; Laterality: Right;  . Vena cava filter placement  2006  . Incisional hernia repair    . Multiple tooth extractions  1990  . Joint replacement    . Total knee arthroplasty  10/15/2011    Procedure: TOTAL KNEE ARTHROPLASTY; Surgeon: Rudean Haskell, MD; Location: [place]; Service: Orthopedics; Laterality: Left;  . Ivc filter    . Video bronchoscopy with endobronchial navigation N/A 06/02/2014    Procedure: VIDEO BRONCHOSCOPY WITH ENDOBRONCHIAL NAVIGATION; Surgeon: Collene Gobble, MD; Location: [facility]; Service: Thoracic; Laterality: N/A;    Family History  Problem Relation Age of Onset  . Adopted: Yes  . Anesthesia problems Neg Hx   . Hypotension Neg Hx   . Malignant hyperthermia Neg Hx   . Pseudochol deficiency Neg Hx     Social History History  Substance Use Topics  . Smoking status: Former Smoker -- 0.75 packs/day for 7 years    Types: Cigarettes    Quit date: 06/15/2012  . Smokeless tobacco:  Never Used     Comment: quit smoking 47yr ago  . Alcohol Use: No    Current Outpatient Prescriptions  Medication Sig Dispense Refill  . carbamazepine (TEGRETOL XR) 200 MG 12 hr tablet Take 200-400 mg by mouth 2 (two) times daily. '200mg'$  in am and '400mg'$  at bedtime    . clotrimazole-betamethasone (LOTRISONE) cream Apply 1 application topically 2 (two) times daily. 2 times a day as needed    . DULoxetine (CYMBALTA) 60 MG capsule Take 120 mg by mouth daily.    . fenofibrate micronized (LOFIBRA) 134 MG capsule Take 134 mg by mouth daily before breakfast.    . Fluticasone Furoate-Vilanterol (BREO ELLIPTA) 100-25 MCG/INH AEPB 1 puff, once daily and rinse mouth- maintenance (Patient taking differently: Inhale 2 puffs into the lungs daily. 1 puff, once daily and rinse mouth- maintenance) 60 each 11  . furosemide (LASIX) 20 MG tablet Take 1 tablet (20 mg total) by mouth daily. 30 tablet 2  . hydroxychloroquine (PLAQUENIL) 200 MG tablet Take 1 tablet by mouth 2 (two) times daily.    . hydrOXYzine (ATARAX/VISTARIL) 25 MG tablet Take 25 mg by mouth 3 (three) times daily as needed for itching (insomnia).     . Ipratropium-Albuterol (COMBIVENT) 20-100 MCG/ACT AERS respimat Inhale 2 puffs into the lungs every 6 (six) hours as needed for wheezing.    .Marland KitchenLORazepam (ATIVAN) 2 MG tablet Take 2 mg by mouth 3 (three) times daily.     . meclizine (ANTIVERT) 25 MG tablet Take 25 mg by mouth 3 (three) times daily as needed for dizziness.    . metoprolol succinate (TOPROL-XL) 100 MG 24 hr tablet Take 1 tablet (100 mg total) by mouth daily. Take with or immediately following a meal. 30 tablet 1  . pregabalin (LYRICA) 75 MG capsule Take 75 mg by mouth 2 (two) times daily.     . verapamil (COVERA HS) 240 MG (CO) 24 hr tablet Take 240 mg by mouth at bedtime.    . Vitamin D, Ergocalciferol, (DRISDOL) 50000 UNITS CAPS capsule Take 50,000 Units  by mouth every 7 (seven) days. Monday     No current facility-administered medications for this visit.    Allergies  Allergen Reactions  . Imitrex [Sumatriptan Base] Nausea And Vomiting    Rapid heart beat.  .Marland KitchenKetorolac Tromethamine Nausea And Vomiting    Rapid heart beat.  . Stadol [Butorphanol Tartrate] Nausea And Vomiting  . Ivp Dye [Iodinated Diagnostic Agents] Nausea Only and Palpitations    Can tolerate iodine  . Shellfish Allergy Nausea Only and Palpitations    Review of Systems  Constitutional: Negative for fever,  activity change, appetite change, fatigue and unexpected weight change.  HENT: Negative.  Eyes: Negative.  Respiratory: Positive for cough and shortness of breath.  Cardiovascular: Negative for chest pain, palpitations and leg swelling.  Gastrointestinal: Negative.  Endocrine: Negative.  Genitourinary: Negative.  Musculoskeletal: Positive for back pain.  Skin: Negative.  Allergic/Immunologic: Negative.  Neurological: Negative.  Hematological: Negative.  Psychiatric/Behavioral: The patient is nervous/anxious.    BP BP 131/34 mmHg  Pulse 81  Temp(Src) 98.6 F (37 C) (Oral)  Resp 20  Ht '5\' 8"'$  (1.727 m)  Wt 106.595 kg (235 lb)  BMI 35.74 kg/m2  SpO2 96%  LMP 06/04/2011  Physical Exam  Constitutional: She is oriented to person, place, and time. She appears well-developed and well-nourished. No distress.  HENT:  Head: Normocephalic and atraumatic.  Mouth/Throat: Oropharynx is clear and moist.  Eyes: EOM are normal. Pupils are equal, round, and reactive to light.  Neck: Normal range of motion. Neck supple. No thyromegaly present.  Cardiovascular: Normal rate, regular rhythm, normal heart sounds and intact distal pulses.  No murmur heard. Pulmonary/Chest: Effort normal and breath sounds normal. No respiratory distress. She has no wheezes. She has no rales.  Abdominal: Soft. Bowel sounds are normal. She  exhibits no distension and no mass. There is no tenderness.  Musculoskeletal: Normal range of motion. She exhibits no edema.  Lymphadenopathy:   She has no cervical adenopathy.  Neurological: She is alert and oriented to person, place, and time. She has normal strength. No cranial nerve deficit or sensory deficit.  Skin: Skin is warm and dry.  Psychiatric: She has a normal mood and affect.     Diagnostic Tests:   CLINICAL DATA: Subsequent treatment strategy for lung mass.  EXAM: NUCLEAR MEDICINE PET SKULL BASE TO THIGH  TECHNIQUE: Eleven point for mCi F-18 FDG was injected intravenously. Full-ring PET imaging was performed from the skull base to thigh after the radiotracer. CT data was obtained and used for attenuation correction and anatomic localization.  FASTING BLOOD GLUCOSE: Value: 130 mg/dl  COMPARISON: Chest CT from 05/05/2014. PET-CT from 08/11/2012.  FINDINGS: NECK  No hypermetabolic lymph nodes in the neck.  CHEST  2.6 cm right lower lobe nodule shows low level FDG uptake with SUV max = 2.1. There is a finger-like extension from the inferolateral aspect of the nodule at least a portion of which represents an adjacent vessel, but CT chest from 02/10/2013 showed no evidence for blood pool level enhancement as would be expected for an AVM or vascular malformation. This nodule has shown continued interval slow growth since the study of 08/11/2012 when it measured about 1.9 cm and demonstrated SUV max = 2.5. No other hypermetabolic lung nodule evident. No evidence for hypermetabolic lymphadenopathy in the chest.  Densely calcified nodal tissue is seen in the subcarinal station in left hilum.  ABDOMEN/PELVIS  No abnormal hypermetabolic activity within the liver, pancreas, adrenal glands, or spleen. No hypermetabolic lymph nodes in the abdomen or pelvis.  Densely calcified nodal tissue is seen in the gastrohepatic ligament and in the left  retrocrural space. IVC filter is identified in-situ.  SKELETON  No focal hypermetabolic activity to suggest skeletal metastasis.  IMPRESSION: Continued growth of the right lower lobe pulmonary nodule, now measuring 2.6 mm in diameter. As before, the nodule has low level FDG accumulation, borderline for infectious/ inflammatory versus neoplastic.   Electronically Signed  By: Misty Stanley M.D.  On: 05/14/2014 10:44           Vitals  Height Weight BMI (Calculated)    '5\' 8"'$  (1.727 m) 229 lb (103.874 kg) 34.9      Interpretation Summary     CLINICAL DATA: Subsequent treatment strategy for lung mass.  EXAM: NUCLEAR MEDICINE PET SKULL BASE TO THIGH  TECHNIQUE: Eleven point for mCi F-18 FDG was injected intravenously. Full-ring PET imaging was performed from the skull base to thigh after the radiotracer. CT data was obtained and used for attenuation correction and anatomic localization.  FASTING BLOOD GLUCOSE: Value: 130 mg/dl  COMPARISON: Chest CT from 05/05/2014. PET-CT from 08/11/2012.  FINDINGS: NECK  No hypermetabolic lymph nodes in the neck.  CHEST  2.6 cm right lower lobe nodule shows low level FDG uptake with SUV max = 2.1. There is a finger-like extension from the inferolateral aspect of the nodule at least a portion of which represents an adjacent vessel, but CT chest from 02/10/2013 showed no evidence for blood pool level enhancement as would be expected for an AVM or vascular malformation. This nodule has shown continued interval slow growth since the study of 08/11/2012 when it measured about 1.9 cm and demonstrated SUV max = 2.5. No other hypermetabolic lung nodule evident. No evidence for hypermetabolic lymphadenopathy in the chest.  Densely calcified nodal tissue is seen in the subcarinal station in left hilum.  ABDOMEN/PELVIS  No abnormal hypermetabolic activity within the liver,  pancreas, adrenal glands, or spleen. No hypermetabolic lymph nodes in the abdomen or pelvis.  Densely calcified nodal tissue is seen in the gastrohepatic ligament and in the left retrocrural space. IVC filter is identified in-situ.  SKELETON  No focal hypermetabolic activity to suggest skeletal metastasis.  IMPRESSION: Continued growth of the right lower lobe pulmonary nodule, now measuring 2.6 mm in diameter. As before, the nodule has low level FDG accumulation, borderline for infectious/ inflammatory versus neoplastic.   Electronically Signed  By: Misty Stanley M.D.  On: 05/14/2014 10:44          Ref Range 3d ago     FVC-Pre L 2.85    FVC-%Pred-Pre % 81    FVC-Post L 2.90    FVC-%Pred-Post % 83    FVC-%Change-Post % 1    FEV1-Pre L 1.99    FEV1-%Pred-Pre % 74    FEV1-Post L 2.16    FEV1-%Pred-Post % 81    FEV1-%Change-Post % 8    FEV6-Pre L 2.80    FEV6-%Pred-Pre % 83    FEV6-Post L 2.90    FEV6-%Pred-Post % 86    FEV6-%Change-Post % 3    Pre FEV1/FVC ratio % 70    FEV1FVC-%Pred-Pre % 90    Post FEV1/FVC ratio % 75    FEV1FVC-%Change-Post % 7    Pre FEV6/FVC Ratio % 98    FEV6FVC-%Pred-Pre % 102    Post FEV6/FVC ratio % 100    FEV6FVC-%Pred-Post % 104    FEV6FVC-%Change-Post % 1    FEF 25-75 Pre L/sec 1.16    FEF2575-%Pred-Pre % 52    FEF 25-75 Post L/sec 1.81    FEF2575-%Pred-Post % 81    FEF2575-%Change-Post % 56    RV L 2.25    RV % pred % 99    TLC L 5.25    TLC % pred % 95    DLCO unc ml/min/mmHg 20.62    DLCO unc % pred % 72    DL/VA ml/min/mmHg/L 4.51    DL/VA % pred % 87   Resulting Agency  [facility]      Specimen Collected: 06/14/14  10:03 AM Last Resulted: 06/14/14 11:11 AM           Impression:  She has a 2.6 cm solitary mass in the RLL that is most likely a carcinoid tumor. Her PFT's are adequate to allow right lower lobectomy and the tumor is in the middle of the lobe so lobectomy would be needed. I reviewed the CT and PET scans with the patient and her daughter. I discussed the procedure of right lower lobectomy, alternatives, benefits and risks including but not limited to bleeding, infection, prolonged air leak, bronchial stump complications and recurrence of the tumor. I answered all of their questions and she understands and would like to proceed with surgery.  Plan:  Right Thoracotomy and right lower lobectomy

## 2014-07-15 NOTE — Brief Op Note (Signed)
07/15/2014  10:09 AM  PATIENT:  Breanna Dixon  69 y.o. female  PRE-OPERATIVE DIAGNOSIS:  RIGHT LUNG Carcinoid  POST-OPERATIVE DIAGNOSIS:  RIGHT LUNG Carcinoid  PROCEDURE:  Procedure(s): RIGHT THORACOTOMY/RIGHT LOWER LOBECTOMY (Right)  SURGEON:  Surgeon(s) and Role:    * Gaye Pollack, MD - Primary  PHYSICIAN ASSISTANT: Suzzanne Cloud, PA-C    ANESTHESIA:   general  EBL:  Total I/O In: 1100 [I.V.:1100] Out: 240 [Urine:140; Blood:100]  BLOOD ADMINISTERED:none  DRAINS: 2 110F Chest Tube(s) in the right pleural space   LOCAL MEDICATIONS USED:  NONE  SPECIMEN:  Source of Specimen:  right lower lobe  DISPOSITION OF SPECIMEN:  PATHOLOGY  COUNTS:  YES  TOURNIQUET:  * No tourniquets in log *  DICTATION: .Note written in EPIC  PLAN OF CARE: Admit to inpatient   PATIENT DISPOSITION:  PACU - hemodynamically stable.   Delay start of Pharmacological VTE agent (>24hrs) due to surgical blood loss or risk of bleeding: yes

## 2014-07-15 NOTE — Op Note (Signed)
[facility] OPERATIVE NOTE 07/15/2014 [place] [id number]  Surgeon:  Gaye Pollack, MD  First Assistant: Suzzanne Cloud, PA-C    Preoperative Diagnosis:  Right lower lobe carcinoid tumor  Postoperative Diagnosis:  Same   Procedure:   1. right muscle-sparing thoracotomy 2.  Right lower lobectomy   Anesthesia:  General Endotracheal   Clinical History/Surgical Indication:  The patient is a 69 year old former heavy smoker with a history of fibromyalgia, Lupus, PTSD, anxiety disorder, PE with what sounds like shock in 2006. She underwent bilateral total knee replacements in 05/2011 and 10/2012 and a CXR at that time showed a RLL nodule. A CT scan on 05/30/2011 showed a 17 mm x 16 mm RLL nodule. A PET scan was done at [facility] and the report showed a 16 x 18 mm nodule with an SUV of 1.4. This lesion as been followed with serial scans and a CT of the chest on 05/05/2014 showed that it had continued to grow and was now 2.6 cm. A PET scan on 05/14/2014 showed low level activity with an SUV of 2.1. There were no other areas of hyperactivity. She underwent a bronch and ENB by Dr. Lamonte Sakai on 06/02/2014 and biopsy showed probable low grade neuroendocrine tumor ( Carcinoid) although there was only a very limited amount of tumor in the sample. She has a 2.6 cm solitary mass in the RLL that is most likely a carcinoid tumor. Her PFT's are adequate to allow right lower lobectomy and the tumor is in the middle of the lobe so lobectomy would be needed. I reviewed the CT and PET scans with the patient and her daughter. I discussed the procedure of right lower lobectomy, alternatives, benefits and risks including but not limited to bleeding, infection, prolonged air leak, bronchial stump complications and recurrence of the tumor. I answered all of their questions and she understands and would like to proceed with surgery.    Preparation:  The patient was seen in the preoperative  holding area and the correct patient, correct operation were confirmed with the patient after reviewing the medical record and xrays. The right side of the chest was signed by me. The consent was signed by me. Preoperative antibiotics were given. A radial arterial line was placed by the anesthesia team. The patient was taken back to the operating room and positioned supine on the operating room table. After being placed under general endotracheal anesthesia by the anesthesia team using a double lumen tube a foley catheter was placed. Lower extremity SCD's were placed. A time out was taken and the correct patient, operation and operative side were confirmed with nursing and anesthesia staff. The patient was turned into the left lateral decubitus position with the right side up. The chest was prepped with betadine soap and solution and draped in the usual sterile manner. A surgical time-out was taken and the correct patient and operative procedure and operative side were confirmed with the nursing and anesthesia staff.    Right muscle-sparing thoracotomy:  The chest was entered through a lateral muscle-sparing thoracotomy incision. The pleural space was entered through the 5th ICS. The pleural space was unremarkable. The upper and middle lobes were normal with no palpable masses. The mass was palpable in the center of the lower lobe. The fissures were both incomplete. The major fissure was opened. The superior segmental and basilar segmental arteries were exposed and encircled with tapes. They were divided using a vascular stapler. The inferior pulmonary ligament was  divided up to the inferior pulmonary vein and it was encircled and divided using a stapler. The incomplete fissure between the upper and lower lobe posteriorly was divided using a stapler. The incomplete fissure anteriorly between the middle and lower lobes was divided using a stapler. The lower lobe bronchus was encircled with a tape. A TA-30  stapler with 4.8 mm staples was passed around it and closed. The right lung was inflated and the upper and middle lobes fully expanded. The stapler was fired and the bronchial stump transected distal to the stapler.  The chest was filled with saline and the bronchial stump tested at 30 cm pressure. There was no air leak.  Hemostasis was complete.  Completion:  An On-Q pain catheter was placed through a stab incision in the chest wall and advanced along the intercostal space posteriorly.  Two 28 F chest tubes were placed in the pleural space posteriorly and anteriorly. The ribs were reapproximated with # 2 vicryl pericostal sutures with the sutures placed through holes drilled in the 6th rib and placed around the 5th rib. The muscles were returned to their normal anatomic position. The subcutaneous tissue was closed with 2-0 vicryl suture and the skin with 3-0 vicryl subcuticular suture. The sponge, needle, and instrument counts were correct according to the nurses. Dry sterile dressings were applied and the chest tubes were connected to pleurevac suction. The patient was turned into the supine position, extubated, and transferred to the PACU in satisfactory and stable condition.

## 2014-07-15 NOTE — Anesthesia Preprocedure Evaluation (Addendum)
Anesthesia Evaluation  Patient identified by MRN, date of birth, ID band Patient awake    Reviewed: Allergy & Precautions, NPO status , Patient's Chart, lab work & pertinent test results, reviewed documented beta blocker date and time   Airway Mallampati: II  TM Distance: >3 FB Neck ROM: Full    Dental  (+) Dental Advisory Given, Edentulous Upper, Edentulous Lower   Pulmonary COPDformer smoker,  breath sounds clear to auscultation        Cardiovascular hypertension, Pt. on medications and Pt. on home beta blockers Rhythm:Regular Rate:Normal     Neuro/Psych  Headaches, Anxiety    GI/Hepatic   Endo/Other  Morbid obesity  Renal/GU      Musculoskeletal  (+) Arthritis -, Fibromyalgia -  Abdominal   Peds  Hematology   Anesthesia Other Findings   Reproductive/Obstetrics                           Anesthesia Physical Anesthesia Plan  ASA: II  Anesthesia Plan: General   Post-op Pain Management:    Induction: Intravenous  Airway Management Planned: Double Lumen EBT  Additional Equipment: Arterial line and CVP  Intra-op Plan:   Post-operative Plan: Extubation in OR  Informed Consent: I have reviewed the patients History and Physical, chart, labs and discussed the procedure including the risks, benefits and alternatives for the proposed anesthesia with the patient or authorized representative who has indicated his/her understanding and acceptance.   Dental advisory given  Plan Discussed with: Anesthesiologist and Surgeon  Anesthesia Plan Comments:         Anesthesia Quick Evaluation

## 2014-07-15 NOTE — Progress Notes (Signed)
Patient ID: Breanna Dixon, female   DOB: [birth year], 69 y.o.   MRN: [id number]  SICU Evening Rounds:   Hemodynamically stable  Pain under control with PCA  Urine output good  CT output low. Small air leak CXR ok  CBC    Component Value Date/Time   WBC 8.0 07/13/2014 1055   RBC 4.55 07/13/2014 1055   HGB 12.5 07/13/2014 1055   HCT 39.0 07/13/2014 1055   PLT 247 07/13/2014 1055   MCV 85.7 07/13/2014 1055   MCH 27.5 07/13/2014 1055   MCHC 32.1 07/13/2014 1055   RDW 13.1 07/13/2014 1055   LYMPHSABS 1.4 06/08/2014 1149   MONOABS 0.4 06/08/2014 1149   EOSABS 0.0 06/08/2014 1149   BASOSABS 0.0 06/08/2014 1149     BMET    Component Value Date/Time   NA 135 07/13/2014 1055   K 4.6 07/13/2014 1055   CL 103 07/13/2014 1055   CO2 20* 07/13/2014 1055   GLUCOSE 122* 07/13/2014 1055   BUN 14 07/13/2014 1055   CREATININE 0.79 07/13/2014 1055   CALCIUM 9.0 07/13/2014 1055   GFRNONAA >60 07/13/2014 1055   GFRAA >60 07/13/2014 1055     A/P:  Stable postop course. Continue current plans

## 2014-07-15 NOTE — Anesthesia Procedure Notes (Addendum)
Procedure Name: Intubation Date/Time: 07/15/2014 7:54 AM Performed by: Merrilyn Puma B Pre-anesthesia Checklist: Patient identified, Timeout performed, Emergency Drugs available, Suction available and Patient being monitored Patient Re-evaluated:Patient Re-evaluated prior to inductionOxygen Delivery Method: Circle system utilized Preoxygenation: Pre-oxygenation with 100% oxygen Intubation Type: IV induction Laryngoscope Size: Mac and 3 Grade View: Grade II Tube type: Oral Endobronchial tube: Left, Double lumen EBT, EBT position confirmed by fiberoptic bronchoscope and EBT position confirmed by auscultation and 39 Fr Number of attempts: 1 Airway Equipment and Method: Stylet and Fiberoptic brochoscope Placement Confirmation: ETT inserted through vocal cords under direct vision,  positive ETCO2,  CO2 detector and breath sounds checked- equal and bilateral Secured at: 27 cm Tube secured with: Tape Dental Injury: Teeth and Oropharynx as per pre-operative assessment

## 2014-07-16 ENCOUNTER — Inpatient Hospital Stay (HOSPITAL_COMMUNITY): Payer: Medicare Other

## 2014-07-16 ENCOUNTER — Encounter (HOSPITAL_COMMUNITY): Payer: Self-pay | Admitting: Surgery

## 2014-07-16 LAB — BASIC METABOLIC PANEL
ANION GAP: 10 (ref 5–15)
BUN: 8 mg/dL (ref 6–20)
CO2: 22 mmol/L (ref 22–32)
Calcium: 8.6 mg/dL — ABNORMAL LOW (ref 8.9–10.3)
Chloride: 105 mmol/L (ref 101–111)
Creatinine, Ser: 0.67 mg/dL (ref 0.44–1.00)
GFR calc non Af Amer: >60 mL/min (ref >60)
GLUCOSE: 135 mg/dL — AB (ref 65–99)
Potassium: 4.1 mmol/L (ref 3.5–5.1)
Sodium: 137 mmol/L (ref 135–145)

## 2014-07-16 LAB — BLOOD GAS, ARTERIAL
Acid-base deficit: 2.9 mmol/L — ABNORMAL HIGH (ref 0.0–2.0)
Bicarbonate: 22.2 mEq/L (ref 20.0–24.0)
O2 Saturation: 95.5 %
PH ART: 7.324 — AB (ref 7.350–7.450)
Patient temperature: 98.6
TCO2: 23.6 mmol/L (ref 0–100)
pCO2 arterial: 44 mmHg (ref 35.0–45.0)
pO2, Arterial: 86 mmHg (ref 80.0–100.0)

## 2014-07-16 LAB — GLUCOSE, CAPILLARY
GLUCOSE-CAPILLARY: 125 mg/dL — AB (ref 65–99)
GLUCOSE-CAPILLARY: 119 mg/dL — AB (ref 65–99)
GLUCOSE-CAPILLARY: 146 mg/dL — AB (ref 65–99)
GLUCOSE-CAPILLARY: 157 mg/dL — AB (ref 65–99)
Glucose-Capillary: 146 mg/dL — ABNORMAL HIGH (ref 65–99)

## 2014-07-16 LAB — CBC
HCT: 36.8 % (ref 36.0–46.0)
Hemoglobin: 11.6 g/dL — ABNORMAL LOW (ref 12.0–15.0)
MCH: 27.8 pg (ref 26.0–34.0)
MCHC: 31.5 g/dL (ref 30.0–36.0)
MCV: 88 fL (ref 78.0–100.0)
PLATELETS: 284 10*3/uL (ref 150–400)
RBC: 4.18 MIL/uL (ref 3.87–5.11)
RDW: 13.4 % (ref 11.5–15.5)
WBC: 13 10*3/uL — AB (ref 4.0–10.5)

## 2014-07-16 MED ORDER — INSULIN ASPART 100 UNIT/ML ~~LOC~~ SOLN
0.0000 [IU] | SUBCUTANEOUS | Status: DC
  Administered 2014-07-16 – 2014-07-17 (×6): 2 [IU] via SUBCUTANEOUS

## 2014-07-16 MED ORDER — ENOXAPARIN SODIUM 40 MG/0.4ML ~~LOC~~ SOLN
40.0000 mg | SUBCUTANEOUS | Status: DC
  Administered 2014-07-16 – 2014-07-20 (×5): 40 mg via SUBCUTANEOUS
  Filled 2014-07-16 (×6): qty 0.4

## 2014-07-16 MED ORDER — HYDROMORPHONE 0.3 MG/ML IV SOLN
INTRAVENOUS | Status: DC
  Administered 2014-07-16: 0.9 mg via INTRAVENOUS
  Administered 2014-07-17: 1.2 mg via INTRAVENOUS
  Administered 2014-07-17: 0.6 mg via INTRAVENOUS
  Administered 2014-07-17: 1.5 mg via INTRAVENOUS
  Administered 2014-07-17: 2.5 mg via INTRAVENOUS
  Administered 2014-07-17 (×2): 0.9 mg via INTRAVENOUS
  Administered 2014-07-18: 1.2 mg via INTRAVENOUS
  Administered 2014-07-18 (×2): 0.6 mg via INTRAVENOUS
  Filled 2014-07-16: qty 25

## 2014-07-16 MED ORDER — DIPHENHYDRAMINE HCL 50 MG/ML IJ SOLN: 12.5000 mg | Freq: Four times a day (QID) | INTRAMUSCULAR | Status: DC | PRN

## 2014-07-16 MED ORDER — HYDROXYZINE HCL 25 MG PO TABS
25.0000 mg | ORAL_TABLET | Freq: Three times a day (TID) | ORAL | Status: DC | PRN
  Administered 2014-07-18: 25 mg via ORAL
  Filled 2014-07-16 (×2): qty 1

## 2014-07-16 MED ORDER — SODIUM CHLORIDE 0.9 % IJ SOLN
9.0000 mL | INTRAMUSCULAR | Status: DC | PRN
Start: 2014-07-16 — End: 2014-07-16

## 2014-07-16 MED ORDER — HYDROMORPHONE 0.3 MG/ML IV SOLN
INTRAVENOUS | Status: AC
  Filled 2014-07-16: qty 25

## 2014-07-16 MED ORDER — SODIUM CHLORIDE 0.9 % IJ SOLN: 9.0000 mL | INTRAMUSCULAR | Status: DC | PRN

## 2014-07-16 MED ORDER — HYDROMORPHONE 0.3 MG/ML IV SOLN
INTRAVENOUS | Status: DC
  Administered 2014-07-16: 0.9 mg via INTRAVENOUS
  Administered 2014-07-16: 2.7 mg via INTRAVENOUS
  Administered 2014-07-16: 25 mL via INTRAVENOUS

## 2014-07-16 MED ORDER — ONDANSETRON HCL 4 MG/2ML IJ SOLN
4.0000 mg | Freq: Four times a day (QID) | INTRAMUSCULAR | Status: DC | PRN
  Administered 2014-07-17: 4 mg via INTRAVENOUS
  Filled 2014-07-16: qty 2

## 2014-07-16 MED ORDER — NALOXONE HCL 0.4 MG/ML IJ SOLN: 0.4000 mg | INTRAMUSCULAR | Status: DC | PRN

## 2014-07-16 MED ORDER — ONDANSETRON HCL 4 MG/2ML IJ SOLN: 4.0000 mg | Freq: Four times a day (QID) | INTRAMUSCULAR | Status: DC | PRN

## 2014-07-16 MED ORDER — METOPROLOL SUCCINATE 12.5 MG HALF TABLET
12.5000 mg | ORAL_TABLET | Freq: Every day | ORAL | Status: DC
  Administered 2014-07-16 – 2014-07-19 (×4): 12.5 mg via ORAL
  Filled 2014-07-16 (×4): qty 1

## 2014-07-16 MED ORDER — DIPHENHYDRAMINE HCL 12.5 MG/5ML PO ELIX
12.5000 mg | ORAL_SOLUTION | Freq: Four times a day (QID) | ORAL | Status: DC | PRN
  Filled 2014-07-16: qty 5

## 2014-07-16 NOTE — Progress Notes (Addendum)
[facility] DAILY ICU PROGRESS NOTE                   [address]            [place] [address]          [phone]   1 Day Post-Op Procedure(s) (LRB): RIGHT THORACOTOMY/RIGHT LOWER LOBE  LOBECTOMY (Right)  Total Length of Stay:  LOS: 1 day   Subjective: Sore, but PCA helping.  Breathing stable, taking po's without nausea.  Has been OOB to chair this am.   Objective: Vital signs in last 24 hours: Temp:  [97.7 F (36.5 C)-98.3 F (36.8 C)] 98.3 F (36.8 C) (06/10 0359) Pulse Rate:  [65-98] 96 (06/10 0600) Cardiac Rhythm:  [-] Normal sinus rhythm (06/10 0400) Resp:  [9-21] 17 (06/10 0600) BP: (87-131)/(42-101) 119/49 mmHg (06/10 0600) SpO2:  [91 %-98 %] 94 % (06/10 0600) Arterial Line BP: (98-151)/(42-75) 148/59 mmHg (06/10 0600)  Filed Weights   07/15/14 0555  Weight: 235 lb (106.595 kg)    Weight change:    Hemodynamic parameters for last 24 hours:    Intake/Output from previous day: 06/09 0701 - 06/10 0700 In: 3568.3 [P.O.:480; I.V.:2988.3; IV Piggyback:100] Out: 3305 [Urine:2515; Blood:100; Chest Tube:690]  Intake/Output this shift:    Current Meds: Scheduled Meds: . acetaminophen  1,000 mg Oral 4 times per day   Or  . acetaminophen (TYLENOL) oral liquid 160 mg/5 mL  1,000 mg Oral 4 times per day  . antiseptic oral rinse  7 mL Mouth Rinse BID  . bisacodyl  10 mg Oral Daily  . carbamazepine  200 mg Oral BID  . DULoxetine  120 mg Oral Daily  . fentaNYL   Intravenous 6 times per day  . hydroxychloroquine  200 mg Oral BID  . insulin aspart  0-24 Units Subcutaneous 6 times per day  . LORazepam  2 mg Oral TID  . metoCLOPramide (REGLAN) injection  10 mg Intravenous 4 times per day  . pregabalin  75 mg Oral BID  . senna-docusate  1 tablet Oral QHS   Continuous Infusions: . bupivacaine 0.5 % ON-Q pump SINGLE CATH 400 mL 400 mL (07/15/14 0903)  . dextrose 5 % and 0.45 % NaCl with KCl 20 mEq/L 100 mL/hr at 07/16/14 0000   PRN  Meds:.diphenhydrAMINE **OR** diphenhydrAMINE, hydrOXYzine, ipratropium-albuterol, meclizine, naloxone **AND** sodium chloride, ondansetron (ZOFRAN) IV, ondansetron (ZOFRAN) IV, potassium chloride   Physical Exam: General appearance: alert, cooperative and no distress Heart: regular rate and rhythm Lungs: Generally clear, no rhonchi or wheezes Wound: Clean and dry Chest tube: No air leak    Lab Results: CBC: Recent Labs  07/13/14 1055 07/16/14 0420  WBC 8.0 13.0*  HGB 12.5 11.6*  HCT 39.0 36.8  PLT 247 284   BMET:  Recent Labs  07/13/14 1055 07/16/14 0420  NA 135 137  K 4.6 4.1  CL 103 105  CO2 20* 22  GLUCOSE 122* 135*  BUN 14 8  CREATININE 0.79 0.67  CALCIUM 9.0 8.6*    PT/INR:  Recent Labs  07/13/14 1055  LABPROT 13.8  INR 1.04   Radiology: Dg Chest Port 1 View  07/16/2014   CLINICAL DATA:  Right lower lobectomy.  EXAM: PORTABLE CHEST - 1 VIEW  COMPARISON:  07/15/2014.  07/13/2014 .  FINDINGS: Right IJ line in stable position. Two right chest tubes are again noted over the right chest. These have been slightly withdrawn. Mediastinum and hilar structures stable. Prior right  lower lobectomy. No pneumothorax. Subsegmental atelectasis left lung base. Mild subcutaneous emphysema right chest. Postsurgical changes right lower ribs .  IMPRESSION: 1. Slight withdrawal of 2 right chest tubes. Right chest tubes remain in good anatomic position. No pneumothorax. Mild subcutaneous emphysema right chest wall. Right IJ line in stable position.  2.  Right lower lobectomy.  Left base subsegmental atelectasis.   Electronically Signed   By: Marcello Moores  Register   On: 07/16/2014 07:30   Dg Chest Port 1 View  07/15/2014   CLINICAL DATA:  Chest tube and central line placement after lobectomy  EXAM: PORTABLE CHEST - 1 VIEW  COMPARISON:  07/13/2014  FINDINGS: There are 2 right-sided chest tubes which are in unremarkable position. Right IJ central line, tip at the SVC level.  Postoperative  hypoventilation and subsegmental atelectasis. Apparent increase in cardiac and upper mediastinal size is likely from technical factors. No visible pneumothorax.  IMPRESSION: 1. Unremarkable positioning of right chest tubes and central line. 2. Hypoventilation and subsegmental atelectasis. 3. No pneumothorax.   Electronically Signed   By: Monte Fantasia M.D.   On: 07/15/2014 11:14     Assessment/Plan: S/P Procedure(s) (LRB): RIGHT THORACOTOMY/RIGHT LOWER LOBE  LOBECTOMY (Right)  CT with no air leak, CXR stable. Output has been around 300 ml serosanguinous drainage per shift.  Will leave chest tubes to suction for now, hopefully can decrease to water seal soon.  Pulm- sats stable on 2L.  Pulm toilet/IS.  HTN- BPs trending up, will restart home meds and titrate as needed.  Decrease IVF since taking pos, d/c a-line, leave Foley one more day, mobilize as tolerated.    Pain fairly well controlled, home fibromyalgia meds restarted.  Routine POD#1 progression.  May be able to tx stepdown soon.  COLLINS,GINA H 07/16/2014 7:54 AM   Chart reviewed, patient examined, agree with above. She is doing well. CXR looks good and no air leak. Will decrease suction to 10 cm. Should be able to remove one tube tomorrow and second tube on Sunday if nothing changes.

## 2014-07-16 NOTE — Progress Notes (Signed)
Patient ID: Breanna Dixon, female   DOB: May 08, 1945, 69 y.o.   MRN: [id number] EVENING ROUNDS NOTE :     [address]       [place] [address]             [phone]                 1 Day Post-Op Procedure(s) (LRB): RIGHT THORACOTOMY/RIGHT LOWER LOBE  LOBECTOMY (Right)  Total Length of Stay:  LOS: 1 day  BP 130/86 mmHg  Pulse 117  Temp(Src) 98.5 F (36.9 C) (Oral)  Resp 17  Ht '5\' 8"'$  (1.727 m)  Wt 235 lb (106.595 kg)  BMI 35.74 kg/m2  SpO2 92%  LMP 06/04/2011  .Intake/Output      06/10 0701 - 06/11 0700   P.O. 660   I.V. (mL/kg) 609.2 (5.7)   IV Piggyback    Total Intake(mL/kg) 1269.2 (11.9)   Urine (mL/kg/hr) 700 (0.5)   Blood    Chest Tube 300 (0.2)   Total Output 1000   Net +269.2         . bupivacaine 0.5 % ON-Q pump SINGLE CATH 400 mL 400 mL (07/15/14 0903)  . dextrose 5 % and 0.45 % NaCl with KCl 20 mEq/L 50 mL/hr (07/16/14 1721)     Lab Results  Component Value Date   WBC 13.0* 07/16/2014   HGB 11.6* 07/16/2014   HCT 36.8 07/16/2014   PLT 284 07/16/2014   GLUCOSE 135* 07/16/2014   ALT 18 07/13/2014   AST 25 07/13/2014   NA 137 07/16/2014   K 4.1 07/16/2014   CL 105 07/16/2014   CREATININE 0.67 07/16/2014   BUN 8 07/16/2014   CO2 22 07/16/2014   INR 1.04 07/13/2014   Slightly confused, slightly slurred speech, will decrease level of pain medication   Grace Isaac MD  Beeper [phone] Office [phone] 07/16/2014 7:33 PM

## 2014-07-17 ENCOUNTER — Inpatient Hospital Stay (HOSPITAL_COMMUNITY): Payer: Medicare Other

## 2014-07-17 LAB — GLUCOSE, CAPILLARY
GLUCOSE-CAPILLARY: 138 mg/dL — AB (ref 65–99)
GLUCOSE-CAPILLARY: 158 mg/dL — AB (ref 65–99)
Glucose-Capillary: 130 mg/dL — ABNORMAL HIGH (ref 65–99)
Glucose-Capillary: 127 mg/dL — ABNORMAL HIGH (ref 65–99)
Glucose-Capillary: 144 mg/dL — ABNORMAL HIGH (ref 65–99)

## 2014-07-17 LAB — CBC
HEMATOCRIT: 35.1 % — AB (ref 36.0–46.0)
HEMOGLOBIN: 10.9 g/dL — AB (ref 12.0–15.0)
MCH: 27.5 pg (ref 26.0–34.0)
MCHC: 31.1 g/dL (ref 30.0–36.0)
MCV: 88.6 fL (ref 78.0–100.0)
PLATELETS: 242 10*3/uL (ref 150–400)
RBC: 3.96 MIL/uL (ref 3.87–5.11)
RDW: 13.5 % (ref 11.5–15.5)
WBC: 10.5 10*3/uL (ref 4.0–10.5)

## 2014-07-17 LAB — BASIC METABOLIC PANEL
Anion gap: 9 (ref 5–15)
BUN: 7 mg/dL (ref 6–20)
CALCIUM: 8.7 mg/dL — AB (ref 8.9–10.3)
CO2: 24 mmol/L (ref 22–32)
CREATININE: 0.65 mg/dL (ref 0.44–1.00)
Chloride: 102 mmol/L (ref 101–111)
GFR calc non Af Amer: >60 mL/min (ref >60)
Glucose, Bld: 139 mg/dL — ABNORMAL HIGH (ref 65–99)
Potassium: 4.1 mmol/L (ref 3.5–5.1)
SODIUM: 135 mmol/L (ref 135–145)

## 2014-07-17 MED ORDER — SODIUM CHLORIDE 0.9 % IJ SOLN: 10.0000 mL | INTRAMUSCULAR | Status: DC | PRN

## 2014-07-17 MED ORDER — SODIUM CHLORIDE 0.9 % IJ SOLN
3.0000 mL | INTRAMUSCULAR | Status: DC | PRN
Start: 2014-07-17 — End: 2014-07-19

## 2014-07-17 MED ORDER — SODIUM CHLORIDE 0.9 % IJ SOLN
3.0000 mL | Freq: Two times a day (BID) | INTRAMUSCULAR | Status: DC
  Administered 2014-07-17 – 2014-07-18 (×3): 3 mL via INTRAVENOUS

## 2014-07-17 MED ORDER — SODIUM CHLORIDE 0.9 % IJ SOLN
10.0000 mL | Freq: Two times a day (BID) | INTRAMUSCULAR | Status: DC
  Administered 2014-07-17 – 2014-07-18 (×4): 10 mL via INTRAVENOUS

## 2014-07-17 MED ORDER — INSULIN ASPART 100 UNIT/ML ~~LOC~~ SOLN
0.0000 [IU] | Freq: Three times a day (TID) | SUBCUTANEOUS | Status: DC
  Administered 2014-07-18 – 2014-07-19 (×3): 2 [IU] via SUBCUTANEOUS

## 2014-07-17 NOTE — Progress Notes (Signed)
Patient ID: Breanna Dixon, female   DOB: 11/14/1945, 69 y.o.   MRN: [id number] [facility] DAILY ICU PROGRESS NOTE                   [address]            [place] [address]          [phone]   2 Days Post-Op Procedure(s) (LRB): RIGHT THORACOTOMY/RIGHT LOWER LOBE  LOBECTOMY (Right)  Total Length of Stay:  LOS: 2 days   Subjective: More alert today on less pca meds  Objective: Vital signs in last 24 hours: Temp:  [97.7 F (36.5 C)-99 F (37.2 C)] 97.7 F (36.5 C) (06/11 0747) Pulse Rate:  [102-130] 108 (06/11 1050) Cardiac Rhythm:  [-] Sinus tachycardia (06/11 0400) Resp:  [12-26] 23 (06/11 0800) BP: (99-143)/(41-111) 113/60 mmHg (06/11 1050) SpO2:  [92 %-98 %] 96 % (06/11 0800) Arterial Line BP: (74-184)/(57-91) 184/91 mmHg (06/10 1300)  Filed Weights   07/15/14 0555  Weight: 235 lb (106.595 kg)    Weight change:    Hemodynamic parameters for last 24 hours:    Intake/Output from previous day: 06/10 0701 - 06/11 0700 In: 1919.2 [P.O.:660; I.V.:1259.2] Out: 2300 [Urine:1710; Chest Tube:590]  Intake/Output this shift: Total I/O In: 443 [P.O.:290; I.V.:153] Out: 375 [Urine:255; Chest Tube:120]  Current Meds: Scheduled Meds: . acetaminophen  1,000 mg Oral 4 times per day   Or  . acetaminophen (TYLENOL) oral liquid 160 mg/5 mL  1,000 mg Oral 4 times per day  . antiseptic oral rinse  7 mL Mouth Rinse BID  . bisacodyl  10 mg Oral Daily  . carbamazepine  200 mg Oral BID  . DULoxetine  120 mg Oral Daily  . enoxaparin (LOVENOX) injection  40 mg Subcutaneous Q24H  . HYDROmorphone PCA 0.3 mg/mL   Intravenous 6 times per day  . hydroxychloroquine  200 mg Oral BID  . insulin aspart  0-24 Units Subcutaneous 6 times per day  . LORazepam  2 mg Oral TID  . metoCLOPramide (REGLAN) injection  10 mg Intravenous 4 times per day  . metoprolol succinate  12.5 mg Oral Daily  . pregabalin  75 mg Oral BID  . senna-docusate  1 tablet Oral QHS  . sodium chloride   10 mL Intravenous Q12H  . sodium chloride  3 mL Intravenous Q12H   Continuous Infusions: . bupivacaine 0.5 % ON-Q pump SINGLE CATH 400 mL 400 mL (07/15/14 0903)  . dextrose 5 % and 0.45 % NaCl with KCl 20 mEq/L 50 mL/hr at 07/16/14 2000   PRN Meds:.diphenhydrAMINE **OR** diphenhydrAMINE, hydrOXYzine, ipratropium-albuterol, meclizine, naloxone **AND** sodium chloride, ondansetron (ZOFRAN) IV, potassium chloride, sodium chloride, sodium chloride  General appearance: alert and cooperative Neurologic: intact Heart: regular rate and rhythm, S1, S2 normal, no murmur, click, rub or gallop Lungs: diminished breath sounds RLL Abdomen: soft, non-tender; bowel sounds normal; no masses,  no organomegaly Extremities: extremities normal, atraumatic, no cyanosis or edema and Homans sign is negative, no sign of DVT Wound: no air leak  Lab Results: CBC: Recent Labs  07/16/14 0420 07/17/14 0412  WBC 13.0* 10.5  HGB 11.6* 10.9*  HCT 36.8 35.1*  PLT 284 242   BMET:  Recent Labs  07/16/14 0420 07/17/14 0412  NA 137 135  K 4.1 4.1  CL 105 102  CO2 22 24  GLUCOSE 135* 139*  BUN 8 7  CREATININE 0.67 0.65  CALCIUM 8.6* 8.7*    PT/INR: No  results for input(s): LABPROT, INR in the last 72 hours. Radiology: Dg Chest Port 1 View  07/17/2014   CLINICAL DATA:  Carcinoid  EXAM: PORTABLE CHEST - 1 VIEW  COMPARISON:  07/16/2014  FINDINGS: There is a right jugular central line with tip in the SVC. There are 2 right chest tubes, unchanged in position. There is no significant pneumothorax. A minute trace of apical pleural air may be present. Minor atelectatic appearing opacity persists in the left base without significant interval change.  IMPRESSION: No significant pneumothorax. Unchanged right chest tubes. Unchanged atelectatic appearing left base opacity   Electronically Signed   By: Andreas Newport M.D.   On: 07/17/2014 06:34     Assessment/Plan: S/P Procedure(s) (LRB): RIGHT THORACOTOMY/RIGHT  LOWER LOBE  LOBECTOMY (Right) Mobilize Diuresis Water seal on chest tubes D/c foley    Grace Isaac 07/17/2014 11:41 AM

## 2014-07-17 NOTE — Progress Notes (Signed)
Patient ID: Breanna Dixon, female   DOB: 12-19-1945, 69 y.o.   MRN: [id number] EVENING ROUNDS NOTE :     [address]       [place] [address]             [phone]                 2 Days Post-Op Procedure(s) (LRB): RIGHT THORACOTOMY/RIGHT LOWER LOBE  LOBECTOMY (Right)  Total Length of Stay:  LOS: 2 days  BP 124/55 mmHg  Pulse 112  Temp(Src) 98.1 F (36.7 C) (Oral)  Resp 12  Ht '5\' 8"'$  (1.727 m)  Wt 235 lb (106.595 kg)  BMI 35.74 kg/m2  SpO2 96%  LMP 06/04/2011  .Intake/Output      06/11 0701 - 06/12 0700   P.O. 1250   I.V. (mL/kg) 666.3 (6.3)   Total Intake(mL/kg) 1916.3 (18)   Urine (mL/kg/hr) 595 (0.4)   Chest Tube 250 (0.2)   Total Output 845   Net +1071.3         . bupivacaine 0.5 % ON-Q pump SINGLE CATH 400 mL 400 mL (07/15/14 0903)  . dextrose 5 % and 0.45 % NaCl with KCl 20 mEq/L 50 mL/hr at 07/17/14 1225     Lab Results  Component Value Date   WBC 10.5 07/17/2014   HGB 10.9* 07/17/2014   HCT 35.1* 07/17/2014   PLT 242 07/17/2014   GLUCOSE 139* 07/17/2014   ALT 18 07/13/2014   AST 25 07/13/2014   NA 135 07/17/2014   K 4.1 07/17/2014   CL 102 07/17/2014   CREATININE 0.65 07/17/2014   BUN 7 07/17/2014   CO2 24 07/17/2014   INR 1.04 07/13/2014   Mental satus has remained bettter today with decreased pca dose   Grace Isaac MD  Beeper [phone] Office [phone] 07/17/2014 8:36 PM

## 2014-07-18 ENCOUNTER — Inpatient Hospital Stay (HOSPITAL_COMMUNITY): Payer: Medicare Other

## 2014-07-18 LAB — GLUCOSE, CAPILLARY
GLUCOSE-CAPILLARY: 122 mg/dL — AB (ref 65–99)
GLUCOSE-CAPILLARY: 95 mg/dL (ref 65–99)
Glucose-Capillary: 100 mg/dL — ABNORMAL HIGH (ref 65–99)
Glucose-Capillary: 143 mg/dL — ABNORMAL HIGH (ref 65–99)
Glucose-Capillary: 109 mg/dL — ABNORMAL HIGH (ref 65–99)

## 2014-07-18 LAB — BASIC METABOLIC PANEL
Anion gap: 11 (ref 5–15)
BUN: 7 mg/dL (ref 6–20)
CO2: 23 mmol/L (ref 22–32)
Calcium: 8.6 mg/dL — ABNORMAL LOW (ref 8.9–10.3)
Chloride: 99 mmol/L — ABNORMAL LOW (ref 101–111)
Creatinine, Ser: 0.58 mg/dL (ref 0.44–1.00)
GFR calc Af Amer: >60 mL/min (ref >60)
GFR calc non Af Amer: >60 mL/min (ref >60)
Glucose, Bld: 160 mg/dL — ABNORMAL HIGH (ref 65–99)
Potassium: 4.3 mmol/L (ref 3.5–5.1)
Sodium: 133 mmol/L — ABNORMAL LOW (ref 135–145)

## 2014-07-18 LAB — CBC
HCT: 34.2 % — ABNORMAL LOW (ref 36.0–46.0)
Hemoglobin: 10.9 g/dL — ABNORMAL LOW (ref 12.0–15.0)
MCH: 28.1 pg (ref 26.0–34.0)
MCHC: 31.9 g/dL (ref 30.0–36.0)
MCV: 88.1 fL (ref 78.0–100.0)
Platelets: 217 10*3/uL (ref 150–400)
RBC: 3.88 MIL/uL (ref 3.87–5.11)
RDW: 13.4 % (ref 11.5–15.5)
WBC: 10.4 10*3/uL (ref 4.0–10.5)

## 2014-07-18 MED ORDER — OXYCODONE HCL 5 MG PO TABS
5.0000 mg | ORAL_TABLET | ORAL | Status: DC | PRN
  Administered 2014-07-18 – 2014-07-20 (×9): 10 mg via ORAL
  Filled 2014-07-18 (×9): qty 2

## 2014-07-18 MED ORDER — ACETYLCYSTEINE 10 % IN SOLN
2.0000 mL | Freq: Four times a day (QID) | RESPIRATORY_TRACT | Status: DC
  Filled 2014-07-18 (×4): qty 4

## 2014-07-18 MED ORDER — TRAMADOL HCL 50 MG PO TABS
50.0000 mg | ORAL_TABLET | Freq: Four times a day (QID) | ORAL | Status: DC | PRN
  Administered 2014-07-18 – 2014-07-19 (×2): 50 mg via ORAL
  Filled 2014-07-18 (×2): qty 1

## 2014-07-18 MED ORDER — GUAIFENESIN ER 600 MG PO TB12
600.0000 mg | ORAL_TABLET | Freq: Two times a day (BID) | ORAL | Status: DC
  Administered 2014-07-18 – 2014-07-20 (×5): 600 mg via ORAL
  Filled 2014-07-18 (×8): qty 1

## 2014-07-18 MED ORDER — LEVALBUTEROL HCL 0.63 MG/3ML IN NEBU
0.6300 mg | INHALATION_SOLUTION | Freq: Four times a day (QID) | RESPIRATORY_TRACT | Status: DC
  Administered 2014-07-18 – 2014-07-19 (×4): 0.63 mg via RESPIRATORY_TRACT
  Filled 2014-07-18 (×8): qty 3

## 2014-07-18 MED ORDER — ACETYLCYSTEINE 20 % IN SOLN
4.0000 mL | Freq: Four times a day (QID) | RESPIRATORY_TRACT | Status: DC
  Administered 2014-07-18 – 2014-07-19 (×4): 4 mL via RESPIRATORY_TRACT
  Filled 2014-07-18 (×8): qty 4

## 2014-07-18 NOTE — Progress Notes (Signed)
Patient ID: Breanna Dixon, female   DOB: Sep 05, 1945, 69 y.o.   MRN: [id number] [facility] DAILY ICU PROGRESS NOTE                   [address]            [place] [address]          [phone]   3 Days Post-Op Procedure(s) (LRB): RIGHT THORACOTOMY/RIGHT LOWER LOBE  LOBECTOMY (Right)  Total Length of Stay:  LOS: 3 days   Subjective: Stable , walks some 300 feet but slow  Objective: Vital signs in last 24 hours: Temp:  [97.3 F (36.3 C)-98.8 F (37.1 C)] 98.4 F (36.9 C) (06/12 0742) Pulse Rate:  [103-127] 127 (06/12 0900) Cardiac Rhythm:  [-] Sinus tachycardia (06/12 0800) Resp:  [12-20] 19 (06/12 0900) BP: (102-143)/(51-99) 114/82 mmHg (06/12 0800) SpO2:  [94 %-100 %] 96 % (06/12 0900) Weight:  [238 lb 12.1 oz (108.3 kg)] 238 lb 12.1 oz (108.3 kg) (06/12 0500)  Filed Weights   07/15/14 0555 07/18/14 0500  Weight: 235 lb (106.595 kg) 238 lb 12.1 oz (108.3 kg)    Weight change:    Hemodynamic parameters for last 24 hours:    Intake/Output from previous day: 06/11 0701 - 06/12 0700 In: 3194.3 [P.O.:1970; I.V.:1224.3] Out: 2090 [Urine:1720; Chest Tube:370]  Intake/Output this shift: Total I/O In: 340 [P.O.:240; I.V.:100] Out: 125 [Urine:125]  Current Meds: Scheduled Meds: . acetaminophen  1,000 mg Oral 4 times per day   Or  . acetaminophen (TYLENOL) oral liquid 160 mg/5 mL  1,000 mg Oral 4 times per day  . antiseptic oral rinse  7 mL Mouth Rinse BID  . bisacodyl  10 mg Oral Daily  . carbamazepine  200 mg Oral BID  . DULoxetine  120 mg Oral Daily  . enoxaparin (LOVENOX) injection  40 mg Subcutaneous Q24H  . HYDROmorphone PCA 0.3 mg/mL   Intravenous 6 times per day  . hydroxychloroquine  200 mg Oral BID  . insulin aspart  0-24 Units Subcutaneous TID WC  . LORazepam  2 mg Oral TID  . metoCLOPramide (REGLAN) injection  10 mg Intravenous 4 times per day  . metoprolol succinate  12.5 mg Oral Daily  . pregabalin  75 mg Oral BID  . senna-docusate   1 tablet Oral QHS  . sodium chloride  10 mL Intravenous Q12H  . sodium chloride  3 mL Intravenous Q12H   Continuous Infusions: . bupivacaine 0.5 % ON-Q pump SINGLE CATH 400 mL 400 mL (07/15/14 0903)  . dextrose 5 % and 0.45 % NaCl with KCl 20 mEq/L 50 mL/hr at 07/18/14 0938   PRN Meds:.diphenhydrAMINE **OR** diphenhydrAMINE, hydrOXYzine, ipratropium-albuterol, meclizine, naloxone **AND** sodium chloride, ondansetron (ZOFRAN) IV, potassium chloride, sodium chloride, sodium chloride  General appearance: alert, cooperative, fatigued and no distress Neurologic: intact Heart: regular rate and rhythm, S1, S2 normal, no murmur, click, rub or gallop Lungs: diminished breath sounds LLL Abdomen: soft, non-tender; bowel sounds normal; no masses,  no organomegaly Extremities: extremities normal, atraumatic, no cyanosis or edema and Homans sign is negative, no sign of DVT Wound: no air leak  Lab Results: CBC: Recent Labs  07/17/14 0412 07/18/14 0400  WBC 10.5 10.4  HGB 10.9* 10.9*  HCT 35.1* 34.2*  PLT 242 217   BMET:  Recent Labs  07/17/14 0412 07/18/14 0400  NA 135 133*  K 4.1 4.3  CL 102 99*  CO2 24 23  GLUCOSE 139* 160*  BUN 7 7  CREATININE 0.65 0.58  CALCIUM 8.7* 8.6*    PT/INR: No results for input(s): LABPROT, INR in the last 72 hours. Radiology: Dg Chest Port 1 View  07/18/2014   CLINICAL DATA:  Check chest tube position  EXAM: PORTABLE CHEST - 1 VIEW  COMPARISON:  07/17/2014  FINDINGS: Cardiac shadow is stable. A right jugular central line is again noted. Chest tubes are seen on the right as well as postsurgical changes in the chest wall. No definitive pneumothorax is seen minimal atelectasis remains in the left base.  IMPRESSION: No change from the prior exam.   Electronically Signed   By: Inez Catalina M.D.   On: 07/18/2014 08:32     Assessment/Plan: S/P Procedure(s) (LRB): RIGHT THORACOTOMY/RIGHT LOWER LOBE  LOBECTOMY (Right) Mobilize Diuresis d/c tubes/lines Add  resp rx D/c one chest tube Transition to po pain meds off pca   Grace Isaac 07/18/2014 9:41 AM

## 2014-07-18 NOTE — Plan of Care (Signed)
Problem: Phase II Progression Outcomes Goal: Pain controlled Outcome: Completed/Met Date Met:  07/18/14 Pt appropriately using R.D. Dilaudid PCA with relief, minimal encouragement required. Goal: Tolerates progressive ambulation Outcome: Not Progressing Pt with severe dyspnea with any activity such as OOB to BSC, only able to ambulate short distances without needing rest break. Will continue to attempt to progress activity.

## 2014-07-18 NOTE — Progress Notes (Signed)
Patient ID: Breanna Dixon, female   DOB: [birth year], 69 y.o.   MRN: [id number] EVENING ROUNDS NOTE :     [address]       [place] [address]             [phone]                 3 Days Post-Op Procedure(s) (LRB): RIGHT THORACOTOMY/RIGHT LOWER LOBE  LOBECTOMY (Right)  Total Length of Stay:  LOS: 3 days  BP 112/54 mmHg  Pulse 126  Temp(Src) 98.4 F (36.9 C) (Oral)  Resp 25  Ht '5\' 8"'$  (1.727 m)  Wt 238 lb 12.1 oz (108.3 kg)  BMI 36.31 kg/m2  SpO2 97%  LMP 06/04/2011  .Intake/Output      06/11 0701 - 06/12 0700 06/12 0701 - 06/13 0700   P.O. 1970 480   I.V. (mL/kg) 1224.3 (11.3) 500 (4.6)   Total Intake(mL/kg) 3194.3 (29.5) 980 (9)   Urine (mL/kg/hr) 1720 (0.7) 525 (0.4)   Chest Tube 370 (0.1) 50 (0)   Total Output 2090 575   Net +1104.3 +405          . bupivacaine 0.5 % ON-Q pump SINGLE CATH 400 mL 400 mL (07/15/14 0903)  . dextrose 5 % and 0.45 % NaCl with KCl 20 mEq/L 50 mL/hr at 07/18/14 1700     Lab Results  Component Value Date   WBC 10.4 07/18/2014   HGB 10.9* 07/18/2014   HCT 34.2* 07/18/2014   PLT 217 07/18/2014   GLUCOSE 160* 07/18/2014   ALT 18 07/13/2014   AST 25 07/13/2014   NA 133* 07/18/2014   K 4.3 07/18/2014   CL 99* 07/18/2014   CREATININE 0.58 07/18/2014   BUN 7 07/18/2014   CO2 23 07/18/2014   INR 1.04 07/13/2014   Stable day, one tube out today  Grace Isaac MD  Beeper [phone] Office [phone] 07/18/2014 5:56 PM

## 2014-07-19 ENCOUNTER — Inpatient Hospital Stay (HOSPITAL_COMMUNITY): Payer: Medicare Other

## 2014-07-19 LAB — BASIC METABOLIC PANEL
Anion gap: 9 (ref 5–15)
BUN: 5 mg/dL — ABNORMAL LOW (ref 6–20)
CO2: 25 mmol/L (ref 22–32)
Calcium: 8.4 mg/dL — ABNORMAL LOW (ref 8.9–10.3)
Chloride: 102 mmol/L (ref 101–111)
Creatinine, Ser: 0.61 mg/dL (ref 0.44–1.00)
GFR calc Af Amer: >60 mL/min (ref >60)
GFR calc non Af Amer: >60 mL/min (ref >60)
Glucose, Bld: 135 mg/dL — ABNORMAL HIGH (ref 65–99)
Potassium: 3.7 mmol/L (ref 3.5–5.1)
Sodium: 136 mmol/L (ref 135–145)

## 2014-07-19 LAB — CBC
HCT: 29.6 % — ABNORMAL LOW (ref 36.0–46.0)
Hemoglobin: 9.4 g/dL — ABNORMAL LOW (ref 12.0–15.0)
MCH: 27.6 pg (ref 26.0–34.0)
MCHC: 31.8 g/dL (ref 30.0–36.0)
MCV: 87.1 fL (ref 78.0–100.0)
Platelets: 235 10*3/uL (ref 150–400)
RBC: 3.4 MIL/uL — ABNORMAL LOW (ref 3.87–5.11)
RDW: 13.4 % (ref 11.5–15.5)
WBC: 7.8 10*3/uL (ref 4.0–10.5)

## 2014-07-19 LAB — GLUCOSE, CAPILLARY: Glucose-Capillary: 128 mg/dL — ABNORMAL HIGH (ref 65–99)

## 2014-07-19 MED ORDER — POTASSIUM CHLORIDE 10 MEQ/50ML IV SOLN: 10.0000 meq | INTRAVENOUS | Status: DC

## 2014-07-19 MED ORDER — ONDANSETRON HCL 4 MG/2ML IJ SOLN
4.0000 mg | Freq: Four times a day (QID) | INTRAMUSCULAR | Status: DC | PRN
  Administered 2014-07-19 – 2014-07-20 (×2): 4 mg via INTRAVENOUS
  Filled 2014-07-19 (×2): qty 2

## 2014-07-19 MED ORDER — POTASSIUM CHLORIDE CRYS ER 20 MEQ PO TBCR
40.0000 meq | EXTENDED_RELEASE_TABLET | Freq: Once | ORAL | Status: AC
  Administered 2014-07-19: 40 meq via ORAL
  Filled 2014-07-19: qty 2

## 2014-07-19 MED ORDER — METOPROLOL SUCCINATE ER 25 MG PO TB24
25.0000 mg | ORAL_TABLET | Freq: Every day | ORAL | Status: DC
  Filled 2014-07-19: qty 1

## 2014-07-19 NOTE — Discharge Summary (Signed)
Physician Discharge Summary       [address]       [place] [address]             [phone]    Patient ID: Breanna Dixon MRN: [id number] DOB/AGE: 10-26-1945 69 y.o.  Admit date: 07/15/2014 Discharge date: 07/20/2014  Admission Diagnoses: 1. Right lower lobe carcinoid tumor 2. History of tobacco abuse 3. History of COPD 4. History of hyperlipidemia 5. History of hypertension 6. History of  PE 7. History of fibromyalgia 8. History of PTSD 9. History of pyloric ulcer 10. History of colon polyps  Discharge Diagnoses:  1. Right lower lobe carcinoid tumor 2. History of tobacco abuse 3. History of COPD 4. History of hyperlipidemia 5. History of hypertension 6. History of  PE 7. History of fibromyalgia 8. History of PTSD 9. History of pyloric ulcer 10. History of colon polyps  Procedure (s):  1. Right muscle-sparing thoracotomy 2. Right lower lobectomy by Dr. Cyndia Bent on 07/15/2014  Pathology: Lung, resection (segmental or lobe), Right lower lobe - ATYPICAL NEUROENDOCRINE TUMOR (ATYPICAL CARCINOID), SEE COMMENT. - BRONCHOVASCULAR MARGIN, NEGATIVE FOR TUMOR. - SEE TUMOR SYNOPTIC TEMPLATE BELOW.  History of Presenting Illness: The patient is a 69 year old former heavy smoker with a history of fibromyalgia, Lupus, PTSD, anxiety disorder, PE with what sounds like shock in 2006. She underwent bilateral total knee replacements in 05/2011 and 10/2012 and a CXR at that time showed a RLL nodule. A CT scan on 05/30/2011 showed a 17 mm x 16 mm RLL nodule. A PET scan was done at [facility] and the report showed a 16 x 18 mm nodule with an SUV of 1.4. This lesion as been followed with serial scans and a CT of the chest on 05/05/2014 showed that it had continued to grow and was now 2.6 cm. A PET scan on 05/14/2014 showed low level activity with an SUV of 2.1. There were no other areas of hyperactivity. She underwent a bronch and ENB by Dr. Lamonte Sakai on 06/02/2014 and  biopsy showed probable low grade neuroendocrine tumor (Carcinoid), although there was only a very limited amount of tumor in the sample.   She has a 2.6 cm solitary mass in the RLL that is most likely a carcinoid tumor. Her PFT's are adequate to allow right lower lobectomy and the tumor is in the middle of the lobe so lobectomy would be needed. I reviewed the CT and PET scans with the patient and her daughter. Dr. Cyndia Bent discussed the procedure of right lower lobectomy, alternatives, benefits and risks including but not limited to bleeding, infection, prolonged air leak, bronchial stump complications and recurrence of the tumor. Dr. Cyndia Bent answered all of their questions and she understands and would like to proceed with surgery. She underwent a muscle sparing RLL on 07/15/2014.  Brief Hospital Course:  She remained afebrile and hemodynamically stable. A line and foley were removed early in her post operative course. Chest tube output gradually decreased. She initially had a small air leak. This did resolve with time. Chest x rays remained stable. Suction was decreased to 10 cm on post operative day one. Chest tubes were placed to water seal on post operative day two. One chest tube was removed on 06/12. Remaining chest tube was removed on 07/19/2014. She was felt surgically stable for transfer from the ICU to 2000 for further convalescence on 07/19/2014. She is ambulating on room air. She has been tolerating a diet. She is  felt surgically stable for discharge today.   Latest Vital Signs: Blood pressure 140/68, pulse 114, temperature 97.7 F (36.5 C), temperature source Oral, resp. rate 20, height '5\' 8"'$  (1.727 m), weight 238 lb 12.1 oz (108.3 kg), last menstrual period 06/04/2011, SpO2 96 %.  Physical Exam: Cardiovascular: Tachycardic Pulmonary: Clear to auscultation on left and diminished at right base; no rales, wheezes, or rhonchi. Abdomen: Soft, non tender, bowel sounds present. Extremities:  Tracebilateral lower extremity edema. Wounds: Clean and dry. No erythema or signs of infection.  Discharge Condition:Stable and discharged to home  Recent laboratory studies:  Lab Results  Component Value Date   WBC 7.8 07/19/2014   HGB 9.4* 07/19/2014   HCT 29.6* 07/19/2014   MCV 87.1 07/19/2014   PLT 235 07/19/2014   Lab Results  Component Value Date   NA 136 07/19/2014   K 3.7 07/19/2014   CL 102 07/19/2014   CO2 25 07/19/2014   CREATININE 0.61 07/19/2014   GLUCOSE 135* 07/19/2014    Diagnostic Studies:  EXAM: CHEST 2 VIEW  COMPARISON: 07/19/2014.  FINDINGS: Mediastinum hilar structures are normal. Right lower lobectomy. No focal infiltrate. Mild right pleural thickening. No pneumothorax. Persistent right chest wall subcutaneous emphysema.  IMPRESSION: 1. Right lower lobectomy. No evidence of pneumothorax. 2. Persistent right chest wall subcutaneous emphysema.   Electronically Signed  By: Marcello Moores Register  On: 07/20/2014 08:12  Discharge Medications:   Medication List    TAKE these medications        acetaminophen 500 MG tablet  Commonly known as:  TYLENOL  Take 500 mg by mouth every 6 (six) hours as needed for headache. Takes two tablets if needed     carbamazepine 200 MG 12 hr tablet  Commonly known as:  TEGRETOL XR  Take 200 mg by mouth 2 (two) times daily.     clotrimazole-betamethasone cream  Commonly known as:  LOTRISONE  Apply 1 application topically 2 (two) times daily. 2 times a day as needed     DULoxetine 60 MG capsule  Commonly known as:  CYMBALTA  Take 120 mg by mouth daily. Daily at bedtime     fenofibrate micronized 134 MG capsule  Commonly known as:  LOFIBRA  Take 134 mg by mouth daily before breakfast.     Fluticasone Furoate-Vilanterol 100-25 MCG/INH Aepb  Commonly known as:  BREO ELLIPTA  1 puff, once daily and rinse mouth- maintenance     furosemide 20 MG tablet  Commonly known as:  LASIX  Take 1 tablet (20 mg  total) by mouth daily.     guaiFENesin 600 MG 12 hr tablet  Commonly known as:  MUCINEX  Take 1 tablet (600 mg total) by mouth 2 (two) times daily as needed. For cough     hydroxychloroquine 200 MG tablet  Commonly known as:  PLAQUENIL  Take 200 mg by mouth 2 (two) times daily.     hydrOXYzine 25 MG tablet  Commonly known as:  ATARAX/VISTARIL  Take 25 mg by mouth every 8 (eight) hours as needed for anxiety or itching.     Ipratropium-Albuterol 20-100 MCG/ACT Aers respimat  Commonly known as:  COMBIVENT  Inhale 2 puffs into the lungs every 6 (six) hours as needed for wheezing.     LORazepam 2 MG tablet  Commonly known as:  ATIVAN  Take 2 mg by mouth 3 (three) times daily. Take extra 1/2 tablet daily as needed     meclizine 25 MG tablet  Commonly known as:  ANTIVERT  Take 25 mg by mouth 3 (three) times daily as needed for dizziness.     metoprolol succinate 100 MG 24 hr tablet  Commonly known as:  TOPROL-XL  Take 1 tablet (100 mg total) by mouth daily. Take with or immediately following a meal.     oxyCODONE 5 MG immediate release tablet  Commonly known as:  Oxy IR/ROXICODONE  Take 1-2 tablets (5-10 mg total) by mouth every 4 (four) hours as needed for severe pain.     pregabalin 75 MG capsule  Commonly known as:  LYRICA  Take 75 mg by mouth 2 (two) times daily.     verapamil 240 MG (CO) 24 hr tablet  Commonly known as:  COVERA HS  Take 240 mg by mouth at bedtime.     Vitamin D (Ergocalciferol) 50000 UNITS Caps capsule  Commonly known as:  DRISDOL  Take 50,000 Units by mouth every 7 (seven) days.        Follow Up Appointments: Follow-up Information    Follow up with Nurse On 07/27/2014.   Why:  Appointment is for chest tube suture removal only. Appoinment time is at 9:30 am   Contact information:   [address] [place] [address] [phone]      Follow up with Gaye Pollack, MD On 08/11/2014.   Specialty:  Cardiothoracic Surgery   Why:   PA/LAT CXR to be taken at [facility] (which is in the same building as Dr. Vivi Martens office) on 08/11/2014 at 10:30 am;Appointment time is at 11:30 am   Contact information:   [place] [address] [phone]       Signed: Lars Pinks MPA-C 07/20/2014, 11:05 AM

## 2014-07-19 NOTE — Progress Notes (Signed)
4 Days Post-Op Procedure(s) (LRB): RIGHT THORACOTOMY/RIGHT LOWER LOBE  LOBECTOMY (Right) Subjective: Sore but otherwise ok. Ambulating well  Objective: Vital signs in last 24 hours: Temp:  [98.2 F (36.8 C)-98.9 F (37.2 C)] 98.4 F (36.9 C) (06/13 0400) Pulse Rate:  [96-127] 113 (06/13 0700) Cardiac Rhythm:  [-] Sinus tachycardia (06/13 0400) Resp:  [14-25] 18 (06/13 0700) BP: (97-138)/(34-92) 119/57 mmHg (06/13 0700) SpO2:  [92 %-100 %] 96 % (06/13 0700)  Hemodynamic parameters for last 24 hours:    Intake/Output from previous day: 06/12 0701 - 06/13 0700 In: 2160 [P.O.:960; I.V.:1200] Out: 2045 [Urine:1925; Chest Tube:120] Intake/Output this shift:    General appearance: alert and cooperative Heart: regular rate and rhythm, S1, S2 normal, no murmur, click, rub or gallop Lungs: clear to auscultation bilaterally Extremities: extremities normal, atraumatic, no cyanosis or edema no air leak from chest tube.  Lab Results:  Recent Labs  07/18/14 0400 07/19/14 0552  WBC 10.4 7.8  HGB 10.9* 9.4*  HCT 34.2* 29.6*  PLT 217 235   BMET:  Recent Labs  07/18/14 0400 07/19/14 0552  NA 133* 136  K 4.3 3.7  CL 99* 102  CO2 23 25  GLUCOSE 160* 135*  BUN 7 5*  CREATININE 0.58 0.61  CALCIUM 8.6* 8.4*    PT/INR: No results for input(s): LABPROT, INR in the last 72 hours. ABG    Component Value Date/Time   PHART 7.324* 07/16/2014 0420   HCO3 22.2 07/16/2014 0420   TCO2 23.6 07/16/2014 0420   ACIDBASEDEF 2.9* 07/16/2014 0420   O2SAT 95.5 07/16/2014 0420   CBG (last 3)   Recent Labs  07/18/14 1206 07/18/14 1655 07/18/14 2128  GLUCAP 109* 122* 95   CLINICAL DATA: Shortness of breath. Chest tube in place.  EXAM: PORTABLE CHEST - 1 VIEW  COMPARISON: 07/18/2014  FINDINGS: Right jugular central venous catheter remains in place with tip overlying the mid SVC. Right-sided chest tube is unchanged. Cardiomediastinal silhouette is unchanged. Postsurgical  changes are again seen from recent right lower lobectomy. There is at most minimal atelectasis at the left lung base. No segmental airspace consolidation, edema, pleural effusion, or pneumothorax is identified. Subcutaneous emphysema in the right chest wall has increased.  IMPRESSION: 1. Unchanged appearance of the lungs. No pneumothorax. 2. Increased right chest wall subcutaneous emphysema.   Electronically Signed  By: Logan Bores  On: 07/19/2014 07:41  Assessment/Plan: S/P Procedure(s) (LRB): RIGHT THORACOTOMY/RIGHT LOWER LOBE  LOBECTOMY (Right)  Doing well overall. Remove remaining chest tube this am. Transfer to 2W and continue mobilization, IS. Plan home tomorrow if no changes. Follow up on pathology.   LOS: 4 days    Gaye Pollack 07/19/2014

## 2014-07-19 NOTE — Progress Notes (Signed)
Pt son expressed some concern about his mother's health with RN and Dr. Cyndia Bent notified of family concerns. Francis Gaines Shakina Choy RN.

## 2014-07-19 NOTE — Progress Notes (Signed)
Utilization review completed.  

## 2014-07-19 NOTE — Discharge Instructions (Signed)
ACTIVITY: ° °1.Increase activity slowly. °2.Walk daily and increase frequency and duration as tolerates. °3.May walk up steps. °4.No lifting more than ten pounds for two weeks. °5.No driving for two weeks. °6.Avoid straining. °7.STOP any activity that causes chest pain, shortness of breath, dizziness,sweating,  °   or excessive weakness. °8.Continue with breathing exercises daily. ° °DIET:  Low fat, Low salt  diet ° ° °WOUND: ° °1.May shower. °2.Clean wounds with mild soap and water.  Call the office at [phone] if any problems arise. ° °Thoracotomy, Care After °Refer to this sheet in the next few weeks. These instructions provide you with information on caring for yourself after your procedure. Your health care provider may also give you more specific instructions. Your treatment has been planned according to current medical practices, but problems sometimes occur. Call your health care provider if you have any problems or questions after your procedure. °WHAT TO EXPECT AFTER YOUR PROCEDURE °After your procedure, it is typical to have the following sensations: °· You may feel pain at the incision site. °· You may be constipated from the pain medicine given and the change in your level of activity. °· You may feel extremely tired. °HOME CARE INSTRUCTIONS °· Take over-the-counter or prescription medicines for pain, discomfort, or fever only as directed by your health care provider. It is very important to take pain relieving medicine before your pain becomes severe. You will be able to breathe and cough more comfortably if your pain is well controlled. °· Take deep breaths. Deep breathing helps to keep your lungs inflated and protects against a lung infection (pneumonia). °· Cough frequently. Even though coughing may cause discomfort, coughing is important to clear mucus (phlegm) and expand your lungs. Coughing helps prevent pneumonia. If it hurts to cough, hold a pillow against your chest when you cough. This  may help with the discomfort. °· Continue to use an incentive spirometer as directed. The use of an incentive spirometer helps to keep your lungs inflated and protects against pneumonia. °· Change the bandages over your incision as needed or as directed by your health care provider. °· Remove the bandages over your chest tube site as directed by your health care provider. °· Resume your normal diet as directed. It is important to have adequate protein, calories, vitamins, and minerals to promote healing. °· Prevent constipation. °¨ Eat high-fiber foods such as whole grain cereals and breads, brown rice, beans, and fresh fruits and vegetables. °¨ Drink enough water and fluids to keep your urine clear or pale yellow. Avoid drinking beverages containing caffeine. Beverages containing caffeine can cause dehydration and harden your stool. °¨ Talk to your health care provider about taking a stool softener or laxative. °· Avoid lifting until you are instructed otherwise. °· Do not drive until directed by your health care provider.  Do not drive while taking pain medicines (narcotics). °· Do not bathe, swim, or use a hot tub until directed by your health care provider. You may shower instead. Gently wash the area of your incision with water and soap as directed. Do not use anything else to clean your incision except as directed by your health care provider. °· Do not use any tobacco products including cigarettes, chewing tobacco, or electronic cigarettes. °· Avoid secondhand smoke. °· Schedule an appointment for stitch (suture) or staple removal as directed. °· Schedule and attend all follow-up visits as directed by your health care provider. It is important to keep all your appointments. °· Participate in pulmonary   rehabilitation as directed by your health care provider. °· Do not travel by airplane for 2 weeks after your chest tube is removed. °SEEK MEDICAL CARE IF: °· You are bleeding from your wounds. °· Your heartbeat  seems irregular. °· You have redness, swelling, or increasing pain in the wounds. °· There is pus coming from your wounds. °· There is a bad smell coming from the wound or dressing. °· You have a fever or chills. °· You have nausea or are vomiting. °· You have muscle aches. °SEEK IMMEDIATE MEDICAL CARE IF: °· You have a rash. °· You have difficulty breathing. °· You have a reaction or side effect to medicines given. °· You have persistent nausea. °· You have lightheadedness or feel faint. °· You have shortness of breath or chest pain. °· You have persistent pain. °Document Released: 07/07/2010 Document Revised: 01/27/2013 Document Reviewed: 09/10/2012 °Exit[employer]® Patient Information ©2015 [employer]. This information is not intended to replace advice given to you by your health care provider. Make sure you discuss any questions you have with your health care provider. ° °

## 2014-07-20 ENCOUNTER — Inpatient Hospital Stay (HOSPITAL_COMMUNITY): Payer: Medicare Other

## 2014-07-20 MED ORDER — GUAIFENESIN ER 600 MG PO TB12: 600.0000 mg | ORAL_TABLET | Freq: Two times a day (BID) | ORAL | Status: DC | PRN

## 2014-07-20 MED ORDER — OXYCODONE HCL 5 MG PO TABS: 5.0000 mg | ORAL_TABLET | ORAL | Status: DC | PRN

## 2014-07-20 MED ORDER — VERAPAMIL HCL ER 120 MG PO TBCR
120.0000 mg | EXTENDED_RELEASE_TABLET | Freq: Every day | ORAL | Status: DC
  Administered 2014-07-20: 120 mg via ORAL
  Filled 2014-07-20: qty 1

## 2014-07-20 MED ORDER — METOPROLOL SUCCINATE ER 50 MG PO TB24
50.0000 mg | ORAL_TABLET | Freq: Every day | ORAL | Status: DC
  Administered 2014-07-20: 50 mg via ORAL
  Filled 2014-07-20 (×2): qty 1

## 2014-07-20 NOTE — Progress Notes (Signed)
Pt discharge education and instructions completed with pt and family at bedside; all voices understanding and denies any questions. Pt IV and telemetry removed; pt discharge home with family to transport her home. Pt handed her prescriptions for oxycodone; pt transported off unit via wheelchair with belongings and family at side. Francis Gaines Alfonsa Vaile RN.

## 2014-07-20 NOTE — Progress Notes (Addendum)
      [address]       [place] [address]             [phone]       5 Days Post-Op Procedure(s) (LRB): RIGHT THORACOTOMY/RIGHT LOWER LOBE  LOBECTOMY (Right)  Subjective: Patient had a very good night. Had a large bowel movement then some loose stools.  Objective: Vital signs in last 24 hours: Temp:  [97.7 F (36.5 C)-99.1 F (37.3 C)] 97.7 F (36.5 C) (06/14 0405) Pulse Rate:  [105-114] 114 (06/14 0405) Cardiac Rhythm:  [-] Sinus tachycardia (06/13 1948) Resp:  [20-22] 20 (06/14 0405) BP: (125-140)/(50-68) 140/68 mmHg (06/14 0405) SpO2:  [95 %-97 %] 96 % (06/14 0405)     Intake/Output from previous day: 06/13 0701 - 06/14 0700 In: 50 [I.V.:50] Out: 700 [Urine:700]   Physical Exam:  Cardiovascular: Tachycardic Pulmonary: Clear to auscultation on left and diminished at right base; no rales, wheezes, or rhonchi. Abdomen: Soft, non tender, bowel sounds present. Extremities: Tracebilateral lower extremity edema. Wounds: Clean and dry.  No erythema or signs of infection.   Lab Results: CBC: Recent Labs  07/18/14 0400 07/19/14 0552  WBC 10.4 7.8  HGB 10.9* 9.4*  HCT 34.2* 29.6*  PLT 217 235   BMET:  Recent Labs  07/18/14 0400 07/19/14 0552  NA 133* 136  K 4.3 3.7  CL 99* 102  CO2 23 25  GLUCOSE 160* 135*  BUN 7 5*  CREATININE 0.58 0.61  CALCIUM 8.6* 8.4*    PT/INR: No results for input(s): LABPROT, INR in the last 72 hours. ABG:  INR: Will add last result for INR, ABG once components are confirmed Will add last 4 CBG results once components are confirmed  Assessment/Plan:  1. CV - ST in the low 100's this am. On Toprol XL 100 mg daily and Verapamil 240 mg at hs prior to surgery. On Toprol XL 25 mg now. Will increase for better HR and BP control and start low dose Verapamil. 2.  Pulmonary - Last chest tube removed yesterday.On room air. CXR this am appears stable (some subcutaneous emphysema right lateral chest wall,  questionable trace right apical pneumothorax). Encourage incentive spirometer. Pathology: Lung, resection (segmental or lobe), Right lower lobe - ATYPICAL NEUROENDOCRINE TUMOR (ATYPICAL CARCINOID), SEE COMMENT. - BRONCHOVASCULAR MARGIN, NEGATIVE FOR TUMOR. - SEE TUMOR SYNOPTIC TEMPLATE BELOW. 3. Anemia-H and H stable at 9.4 and 29.6 4. Stop stool softeners 5. Likely discharge later today  ZIMMERMAN,DONIELLE MPA-C 07/20/2014,7:29 AM   Chart reviewed, patient examined, agree with above. She feels well and looks good. CXR stable Pathology reviewed and shows atypical carcinoid with negative margins. Discussed with patient. She can go home today.

## 2014-07-20 NOTE — Care Management Note (Addendum)
Case Management Note  Patient Details  Name: Breanna Dixon MRN: [id number] Date of Birth: [birth year]  Subjective/Objective:   Pt admitted s/p lobectomy                 Action/Plan: PTA pt lived at home- anticipate return home when medically stable  Expected Discharge Date:                  Expected Discharge Plan:  [place]  In-House Referral:     Discharge planning Services  CM Consult  Post Acute Care Choice:    Choice offered to:     DME Arranged:    DME Agency:     HH Arranged:    HH Agency:     Status of Service:  In process, will continue to follow  Medicare Important Message Given:  Yes Date Medicare IM Given:  07/20/14 Medicare IM give by:  Marvetta Gibbons RN, BSN  Date Additional Medicare IM Given:    Additional Medicare Important Message give by:     If discussed at [place] of Stay Meetings, dates discussed:    Additional Comments:  Dawayne Patricia, RN 07/20/2014, 10:33 AM

## 2014-07-27 ENCOUNTER — Ambulatory Visit: Payer: Self-pay

## 2014-07-27 DIAGNOSIS — Z4802 Encounter for removal of sutures: Secondary | ICD-10-CM

## 2014-07-27 DIAGNOSIS — Z9889 Other specified postprocedural states: Secondary | ICD-10-CM

## 2014-07-27 DIAGNOSIS — G8918 Other acute postprocedural pain: Secondary | ICD-10-CM

## 2014-07-27 MED ORDER — OXYCODONE HCL 5 MG PO TABS
5.0000 mg | ORAL_TABLET | Freq: Four times a day (QID) | ORAL | Status: DC | PRN
Start: 2014-07-27 — End: 2014-08-04

## 2014-07-27 NOTE — Progress Notes (Signed)
Removed 2 sutures from chest tube incision sites. No signs of infection and patient tolerated well. RX refill given for pain medication. Oxycodone 5 mg.

## 2014-08-04 ENCOUNTER — Other Ambulatory Visit: Payer: Self-pay | Admitting: *Deleted

## 2014-08-04 DIAGNOSIS — G8918 Other acute postprocedural pain: Secondary | ICD-10-CM

## 2014-08-04 MED ORDER — OXYCODONE HCL 5 MG PO TABS
5.0000 mg | ORAL_TABLET | Freq: Four times a day (QID) | ORAL | Status: DC | PRN
Start: 2014-08-04 — End: 2014-08-11

## 2014-08-04 NOTE — Telephone Encounter (Signed)
Breanna Dixon has called requesting a refill for her Oxycodone s/p thoracic surgery, last filled on 07/27/14. She will see Dr. Cyndia Bent next week for follow up.  I informed her that a new signed script would be available today at our office and she agreed.

## 2014-08-05 ENCOUNTER — Other Ambulatory Visit: Payer: Self-pay | Admitting: Surgery

## 2014-08-05 DIAGNOSIS — Z902 Acquired absence of lung [part of]: Secondary | ICD-10-CM

## 2014-08-06 ENCOUNTER — Other Ambulatory Visit: Payer: Self-pay | Admitting: Internal Medicine

## 2014-08-11 ENCOUNTER — Ambulatory Visit (INDEPENDENT_AMBULATORY_CARE_PROVIDER_SITE_OTHER): Payer: Self-pay | Admitting: Surgery

## 2014-08-11 ENCOUNTER — Encounter: Payer: Self-pay | Admitting: Surgery

## 2014-08-11 ENCOUNTER — Ambulatory Visit
Admission: RE | Admit: 2014-08-11 | Discharge: 2014-08-11 | Disposition: A | Discharge status: Home / Self Care | Payer: Medicare Other | Source: Ambulatory Visit | Attending: Surgery | Admitting: Surgery

## 2014-08-11 VITALS — BP 134/76 | HR 80 | Resp 20 | Ht 68.0 in | Wt 235.0 lb

## 2014-08-11 DIAGNOSIS — Z902 Acquired absence of lung [part of]: Secondary | ICD-10-CM

## 2014-08-11 DIAGNOSIS — D3A00 Benign carcinoid tumor of unspecified site: Secondary | ICD-10-CM

## 2014-08-11 DIAGNOSIS — Z9889 Other specified postprocedural states: Secondary | ICD-10-CM

## 2014-08-11 DIAGNOSIS — Z853 Personal history of malignant neoplasm of breast: Secondary | ICD-10-CM

## 2014-08-11 DIAGNOSIS — G8918 Other acute postprocedural pain: Secondary | ICD-10-CM

## 2014-08-11 DIAGNOSIS — Z86711 Personal history of pulmonary embolism: Secondary | ICD-10-CM

## 2014-08-11 MED ORDER — OXYCODONE HCL 5 MG PO TABS
5.0000 mg | ORAL_TABLET | Freq: Four times a day (QID) | ORAL | Status: DC | PRN
Start: 2014-08-11 — End: 2014-09-08

## 2014-08-12 ENCOUNTER — Encounter: Payer: Self-pay | Admitting: Surgery

## 2014-08-12 NOTE — Progress Notes (Signed)
HPI:  Patient returns for routine postoperative follow-up having undergone right muscle-sparing thoracotomy and right lower lobectomy for Carcinoid tumor on 07/15/2014. The final pathology showed a 3.6 cm atypical carcinoid tumor with negative margins and lymph nodes. The final stage was T2a, N0, M0. The patient's early postoperative recovery while in the hospital was notable for an uncomplicated postop course. Since hospital discharge the patient reports that she is continuing to improve. She still has some right chest wall pain and is taking oxycodone as needed. She denies shortness of breath and fever. She reported some complaints about the nursing care in the SICU but listening to her complaints I think she was clearly hallucinating.    Current Outpatient Prescriptions  Medication Sig Dispense Refill  . acetaminophen (TYLENOL) 500 MG tablet Take 500 mg by mouth every 6 (six) hours as needed for headache. Takes two tablets if needed    . carbamazepine (TEGRETOL XR) 200 MG 12 hr tablet Take 200 mg by mouth 2 (two) times daily.    . clotrimazole-betamethasone (LOTRISONE) cream Apply 1 application topically 2 (two) times daily. 2 times a day as needed    . COMBIVENT RESPIMAT 20-100 MCG/ACT AERS respimat INHALE TWO PUFFS INTO LUNGS  EVERY 6 HOURS AS NEEDED FOR SHORTNESS OF BREATH 1 Inhaler 1  . DULoxetine (CYMBALTA) 60 MG capsule Take 120 mg by mouth daily. Daily at bedtime    . fenofibrate micronized (LOFIBRA) 134 MG capsule Take 134 mg by mouth daily before breakfast.    . Fluticasone Furoate-Vilanterol (BREO ELLIPTA) 100-25 MCG/INH AEPB 1 puff, once daily and rinse mouth- maintenance (Patient taking differently: Inhale 2 puffs into the lungs daily. 1 puff, once daily and rinse mouth- maintenance) 60 each 11  . furosemide (LASIX) 20 MG tablet Take 1 tablet (20 mg total) by mouth daily. 30 tablet 2  . guaiFENesin (MUCINEX) 600 MG 12 hr tablet Take 1 tablet (600 mg total) by mouth 2 (two)  times daily as needed. For cough    . hydroxychloroquine (PLAQUENIL) 200 MG tablet Take 200 mg by mouth 2 (two) times daily.    . hydrOXYzine (ATARAX/VISTARIL) 25 MG tablet Take 25 mg by mouth every 8 (eight) hours as needed for anxiety or itching.    [name] LORazepam (ATIVAN) 2 MG tablet Take 2 mg by mouth 3 (three) times daily. Take extra 1/2 tablet daily as needed    . meclizine (ANTIVERT) 25 MG tablet Take 25 mg by mouth 3 (three) times daily as needed for dizziness.    . metoprolol succinate (TOPROL-XL) 100 MG 24 hr tablet Take 1 tablet (100 mg total) by mouth daily. Take with or immediately following a meal. 30 tablet 1  . oxyCODONE (OXY IR/ROXICODONE) 5 MG immediate release tablet Take 1-2 tablets (5-10 mg total) by mouth every 6 (six) hours as needed for severe pain. 40 tablet 0  . pregabalin (LYRICA) 75 MG capsule Take 75 mg by mouth 2 (two) times daily.    . verapamil (COVERA HS) 240 MG (CO) 24 hr tablet Take 240 mg by mouth at bedtime.    . Vitamin D, Ergocalciferol, (DRISDOL) 50000 UNITS CAPS capsule Take 50,000 Units by mouth every 7 (seven) days.     No current facility-administered medications for this visit.    Physical Exam: BP 134/76 mmHg  Pulse 80  Resp 20  Ht '5\' 8"'$  (1.727 m)  Wt 235 lb (106.595 kg)  BMI 35.74 kg/m2  SpO2 95%  LMP 06/04/2011 She  looks well. Lung exam is clear. Cardiac exam shows a regular rate and rhythm with normal heart sounds. Right chest incision is healing well.   Diagnostic Tests:  CLINICAL DATA: Status post right lower lobectomy on July 15, 2014, patient is now experiencing dyspnea on exertion  EXAM: CHEST 2 VIEW  COMPARISON: Chest x-ray of July 20, 2014  FINDINGS: The left lung is well-expanded and clear. On the right there is persistent volume loss. A right pleural effusion is present and stable. There is no pneumothorax. The heart is not enlarged. The pulmonary vascularity is not engorged. The bony thorax exhibits no acute  abnormality.  IMPRESSION: Persistent moderate-sized right pleural effusion. Otherwise there is no acute cardiopulmonary abnormality.   Electronically Signed  By: David Martinique M.D.  On: 08/11/2014 11:30   Impression:  Overall I think she is doing well. There is still a small right pleural effusion at the base but I would expect that to resolve with time. I encouraged her to continue ambulating and to use her IS. I renewed her oxycodone today.   Plan:  I will see her back in 3 months with a CXR.    Gaye Pollack, MD [facility] [phone]

## 2014-08-24 ENCOUNTER — Other Ambulatory Visit: Payer: Self-pay | Admitting: *Deleted

## 2014-08-24 DIAGNOSIS — G8918 Other acute postprocedural pain: Secondary | ICD-10-CM

## 2014-08-24 MED ORDER — HYDROCODONE-ACETAMINOPHEN 7.5-325 MG PO TABS: 1.0000 | ORAL_TABLET | Freq: Four times a day (QID) | ORAL | Status: DC | PRN

## 2014-09-04 ENCOUNTER — Emergency Department (HOSPITAL_COMMUNITY): Payer: Medicare Other

## 2014-09-04 ENCOUNTER — Encounter (HOSPITAL_COMMUNITY): Payer: Self-pay | Admitting: Emergency Medicine

## 2014-09-04 ENCOUNTER — Emergency Department (HOSPITAL_COMMUNITY)
Admission: EM | Admit: 2014-09-04 | Discharge: 2014-09-05 | Disposition: A | Discharge status: Home / Self Care | Payer: Medicare Other | Attending: Emergency Medicine | Admitting: Emergency Medicine

## 2014-09-04 DIAGNOSIS — Z86711 Personal history of pulmonary embolism: Secondary | ICD-10-CM | POA: Diagnosis not present

## 2014-09-04 DIAGNOSIS — I10 Essential (primary) hypertension: Secondary | ICD-10-CM | POA: Diagnosis not present

## 2014-09-04 DIAGNOSIS — F4310 Post-traumatic stress disorder, unspecified: Secondary | ICD-10-CM | POA: Diagnosis not present

## 2014-09-04 DIAGNOSIS — J441 Chronic obstructive pulmonary disease with (acute) exacerbation: Secondary | ICD-10-CM | POA: Diagnosis not present

## 2014-09-04 DIAGNOSIS — G8918 Other acute postprocedural pain: Secondary | ICD-10-CM | POA: Insufficient documentation

## 2014-09-04 DIAGNOSIS — Z853 Personal history of malignant neoplasm of breast: Secondary | ICD-10-CM | POA: Diagnosis not present

## 2014-09-04 DIAGNOSIS — E785 Hyperlipidemia, unspecified: Secondary | ICD-10-CM | POA: Diagnosis not present

## 2014-09-04 DIAGNOSIS — Z8619 Personal history of other infectious and parasitic diseases: Secondary | ICD-10-CM | POA: Diagnosis not present

## 2014-09-04 DIAGNOSIS — M1990 Unspecified osteoarthritis, unspecified site: Secondary | ICD-10-CM | POA: Diagnosis not present

## 2014-09-04 DIAGNOSIS — Z9889 Other specified postprocedural states: Secondary | ICD-10-CM | POA: Diagnosis not present

## 2014-09-04 DIAGNOSIS — F419 Anxiety disorder, unspecified: Secondary | ICD-10-CM | POA: Insufficient documentation

## 2014-09-04 DIAGNOSIS — Z87891 Personal history of nicotine dependence: Secondary | ICD-10-CM | POA: Diagnosis not present

## 2014-09-04 DIAGNOSIS — Z87442 Personal history of urinary calculi: Secondary | ICD-10-CM | POA: Insufficient documentation

## 2014-09-04 DIAGNOSIS — Z79899 Other long term (current) drug therapy: Secondary | ICD-10-CM | POA: Diagnosis not present

## 2014-09-04 DIAGNOSIS — M797 Fibromyalgia: Secondary | ICD-10-CM | POA: Insufficient documentation

## 2014-09-04 DIAGNOSIS — R0602 Shortness of breath: Secondary | ICD-10-CM | POA: Diagnosis present

## 2014-09-04 DIAGNOSIS — Z8701 Personal history of pneumonia (recurrent): Secondary | ICD-10-CM | POA: Insufficient documentation

## 2014-09-04 LAB — BASIC METABOLIC PANEL
Anion gap: 8 (ref 5–15)
BUN: 21 mg/dL — ABNORMAL HIGH (ref 6–20)
CALCIUM: 8.8 mg/dL — AB (ref 8.9–10.3)
CHLORIDE: 106 mmol/L (ref 101–111)
CO2: 23 mmol/L (ref 22–32)
Creatinine, Ser: 0.86 mg/dL (ref 0.44–1.00)
GFR calc Af Amer: >60 mL/min (ref >60)
GFR calc non Af Amer: >60 mL/min (ref >60)
GLUCOSE: 109 mg/dL — AB (ref 65–99)
Potassium: 3.9 mmol/L (ref 3.5–5.1)
SODIUM: 137 mmol/L (ref 135–145)

## 2014-09-04 LAB — TROPONIN I

## 2014-09-04 LAB — CBC
HCT: 38.5 % (ref 36.0–46.0)
Hemoglobin: 12 g/dL (ref 12.0–15.0)
MCH: 27.1 pg (ref 26.0–34.0)
MCHC: 31.2 g/dL (ref 30.0–36.0)
MCV: 87.1 fL (ref 78.0–100.0)
PLATELETS: 275 10*3/uL (ref 150–400)
RBC: 4.42 MIL/uL (ref 3.87–5.11)
RDW: 13.1 % (ref 11.5–15.5)
WBC: 7.8 10*3/uL (ref 4.0–10.5)

## 2014-09-04 MED ORDER — OXYCODONE-ACETAMINOPHEN 5-325 MG PO TABS
1.0000 | ORAL_TABLET | Freq: Once | ORAL | Status: AC
  Administered 2014-09-05: 1 via ORAL
  Filled 2014-09-04: qty 1

## 2014-09-04 MED ORDER — DIPHENHYDRAMINE HCL 25 MG PO CAPS
50.0000 mg | ORAL_CAPSULE | Freq: Once | ORAL | Status: AC
  Administered 2014-09-04: 50 mg via ORAL
  Filled 2014-09-04: qty 2

## 2014-09-04 MED ORDER — METHYLPREDNISOLONE SODIUM SUCC 125 MG IJ SOLR
125.0000 mg | Freq: Once | INTRAMUSCULAR | Status: AC
  Administered 2014-09-04: 125 mg via INTRAVENOUS
  Filled 2014-09-04: qty 2

## 2014-09-04 MED ORDER — IOHEXOL 350 MG/ML SOLN
100.0000 mL | Freq: Once | INTRAVENOUS | Status: AC | PRN
  Administered 2014-09-04: 100 mL via INTRAVENOUS

## 2014-09-04 MED ORDER — OXYCODONE-ACETAMINOPHEN 5-325 MG PO TABS
1.0000 | ORAL_TABLET | Freq: Once | ORAL | Status: AC
  Administered 2014-09-04: 1 via ORAL
  Filled 2014-09-04: qty 1

## 2014-09-04 NOTE — ED Notes (Addendum)
Pt from home c/o shortness of breath, right breast pain, mid sternal pain, and upper back pain. Hx of lung CA, PE, and COPD. Pt very talkative during assessment and difficult to obtain information pertaining to today's visit. Recent right lung surgery

## 2014-09-04 NOTE — Discharge Instructions (Signed)
Pain Relief Preoperatively and Postoperatively Being a good patient does not mean being a silent one.If you have questions, problems, or concerns about the pain you may feel after surgery, let your caregiver know.Patients have the right to assessment and management of pain. The treatment of pain after surgery is important to speed up recovery and return to normal activities. Severe pain after surgery, and the fear or anxiety associated with that pain, may cause extreme discomfort that:  Prevents sleep.  Decreases the ability to breathe deeply and cough. This can cause pneumonia or other upper airway infections.  Causes your heart to beat faster and your blood pressure to be higher.  Increases the risk for constipation and bloating.  Decreases the ability of wounds to heal.  May result in depression, increased anxiety, and feelings of helplessness. Relief of pain before surgery is also important because it will lessen the pain after surgery. Patients who receive both pain relief before and after surgery experience greater pain relief than those who only receive pain relief after surgery. Let your caregiver know if you are having uncontrolled pain.This is very important.Pain after surgery is more difficult to manage if it is permitted to become severe, so prompt and adequate treatment of acute pain is necessary. PAIN CONTROL METHODS Your caregivers follow policies and procedures about the management of patient pain.These guidelines should be explained to you before surgery.Plans for pain control after surgery must be mutually decided upon and instituted with your full understanding and agreement.Do not be afraid to ask questions regarding the care you are receiving.There are many different ways your caregivers will attempt to control your pain, including the following methods.   Opioids  Moderate to moderately severe acute pain after surgery may respond to opioids.Opioids are narcotic  pain medicine. Opioids are often combined with non-narcotic medicines to improve pain relief, diminish the risk of side effects, and reduce the chance of addiction.  If you follow your caregiver's directions about taking opioids and you do not have a history of substance abuse, your risk of becoming addicted is exceptionally small.Opioids are given for short periods of time in careful doses to prevent addiction. Other methods of pain control include:  Steroids.  Physical therapy.  Heat and cold therapy.  Compression, such as wrapping an elastic bandage around the area of pain.  Massage. These various ways of controlling pain may be used together. Combining different methods of pain control is called multimodal analgesia. Using this approach has many benefits, including being able to eat, move around, and leave the hospital sooner. Document Released: 04/14/2002 Document Revised: 04/16/2011 Document Reviewed: 04/18/2010 [facility] Patient Information 2015 [place], [place]. This information is not intended to replace advice given to you by your health care provider. Make sure you discuss any questions you have with your health care provider.

## 2014-09-04 NOTE — ED Provider Notes (Signed)
CSN: [id number]     Arrival date & time 09/04/14  1932 History   First MD Initiated Contact with Patient 09/04/14 1944     Chief Complaint  Patient presents with  . Shortness of Breath     (Consider location/radiation/quality/duration/timing/severity/associated sxs/prior Treatment) HPI Comments: 69yo F w/ extensive PMH including lung CA s/p recent resection, hypertension, COPD, PE status post IVC filter who presents with chest pain and shortness of breath. Patient states that she had lung cancer resection on 7/9. Since her surgery, she has had some right-sided chest pain that is under her right breast and shoots to her back. She has been managing the pain with oxycodone and has established care at a pain clinic. She has been trying to wean herself off the pain medication and states that she has not taken any narcotics for almost a week. She presents today because her pain has been worse than usual. She has associated shortness of breath which is worse with exertion and has been present since the surgery. No fevers or cough/cold symptoms. No vomiting or abdominal pain.  Patient is a 69 y.o. female presenting with shortness of breath. The history is provided by the patient and a relative.  Shortness of Breath   Past Medical History  Diagnosis Date  . Seasonal allergies     flonase prn  . Lung mass April 2013    Right lower lobe lung mass  . Arthritis   . Pulmonary embolism 2006    IVC filter  . PTSD (post-traumatic stress disorder)     takes Cymbalta daily  . Anxiety     takes Ativan daily  . Fibromyalgia     takes Lyrica daily  . History of bronchitis   . Hyperlipidemia     takes Fenofibrate daily  . COPD (chronic obstructive pulmonary disease)   . Headache(784.0)     takes Tegretol and Verapamil nightly  . Hypertension     takes Toprol daily  . Hypertension   . Vertigo     takes Antivert prn  . Lupus     takes Cymbalta,Plaquenil  daily  . Joint pain   . History of ulcer  disease     pyloric   . History of colon polyps     benign  . History of blood transfusion     no abnormal reaction noted  . Pneumonia     hx of  . History of kidney stones   . Cancer     breast hx  . Bleeding ulcer 1974   Past Surgical History  Procedure Laterality Date  . Cholecystectomy  1994  . Back surgery  2000/2010    fusion  . Tonsillectomy  1967  . Appendectomy  1965  . Cystoscopy      multiple  . Urethral dilation  1971  . Abdominal hysterectomy  1992  . Dilation and curettage of uterus    . Colonoscopy    . Knee arthroscopy Bilateral   . Total knee arthroplasty  06/04/2011    Procedure: TOTAL KNEE ARTHROPLASTY;  Surgeon: Rudean Haskell, MD;  Location: [place];  Service: Orthopedics;  Laterality: Right;  . Vena cava filter placement  2006  . Incisional hernia repair    . Multiple tooth extractions  1990  . Total knee arthroplasty  10/15/2011    Procedure: TOTAL KNEE ARTHROPLASTY;  Surgeon: Rudean Haskell, MD;  Location: [place];  Service: Orthopedics;  Laterality: Left;  . Ivc filter    .  Video bronchoscopy with endobronchial navigation N/A 06/02/2014    Procedure: VIDEO BRONCHOSCOPY WITH ENDOBRONCHIAL NAVIGATION;  Surgeon: Collene Gobble, MD;  Location: [place];  Service: Thoracic;  Laterality: N/A;  . Breast surgery      bilateral partial masectomy  . Hernia repair    . Joint replacement Bilateral     Knees  . Cardiac catheterization  1992    no PCI  . Thoracotomy/lobectomy Right 07/15/2014    Procedure: RIGHT THORACOTOMY/RIGHT LOWER LOBE  LOBECTOMY;  Surgeon: Gaye Pollack, MD;  Location: [facility];  Service: Thoracic;  Laterality: Right;   Family History  Problem Relation Age of Onset  . Adopted: Yes  . Anesthesia problems Neg Hx   . Hypotension Neg Hx   . Malignant hyperthermia Neg Hx   . Pseudochol deficiency Neg Hx    History  Substance Use Topics  . Smoking status: Former Smoker -- 0.75 packs/day for 7 years    Types: Cigarettes    Quit date: 06/15/2012   . Smokeless tobacco: Never Used     Comment: quit smoking 86yr ago  . Alcohol Use: No   OB History    No data available     Review of Systems  Respiratory: Positive for shortness of breath.    10 Systems reviewed and are negative for acute change except as noted in the HPI.    Allergies  Imitrex; Ketorolac tromethamine; Stadol; Ivp dye; and Shellfish allergy  Home Medications   Prior to Admission medications   Medication Sig Start Date End Date Taking? Authorizing Provider  acetaminophen (TYLENOL) 500 MG tablet Take 500 mg by mouth every 6 (six) hours as needed for headache. Takes two tablets if needed   Yes Historical Provider, MD  carbamazepine (TEGRETOL XR) 200 MG 12 hr tablet Take 100-200 mg by mouth 2 (two) times daily. 100 mg q am & 200 mg qhs   Yes Historical Provider, MD  clotrimazole-betamethasone (LOTRISONE) cream Apply 1 application topically 2 (two) times daily as needed (skin irritation).    Yes Historical Provider, MD  COMBIVENT RESPIMAT 20-100 MCG/ACT AERS respimat INHALE TWO PUFFS INTO LUNGS  EVERY 6 HOURS AS NEEDED FOR SHORTNESS OF BREATH 08/06/14  Yes CDeneise Lever MD  DULoxetine (CYMBALTA) 60 MG capsule Take 120 mg by mouth daily. Daily at bedtime   Yes Historical Provider, MD  fenofibrate micronized (LOFIBRA) 134 MG capsule Take 134 mg by mouth daily before breakfast.   Yes Historical Provider, MD  furosemide (LASIX) 20 MG tablet Take 1 tablet (20 mg total) by mouth daily. 01/12/12  Yes GOswald Hillock MD  hydroxychloroquine (PLAQUENIL) 200 MG tablet Take 200 mg by mouth 2 (two) times daily.   Yes Historical Provider, MD  LORazepam (ATIVAN) 2 MG tablet Take 2 mg by mouth 3 (three) times daily as needed for anxiety. Take extra 1/2 tablet daily as needed   Yes Historical Provider, MD  metoprolol succinate (TOPROL-XL) 100 MG 24 hr tablet Take 1 tablet (100 mg total) by mouth daily. Take with or immediately following a meal. 01/12/12  Yes GOswald Hillock MD  pregabalin  (LYRICA) 75 MG capsule Take 75 mg by mouth 2 (two) times daily.   Yes Historical Provider, MD  verapamil (COVERA HS) 240 MG (CO) 24 hr tablet Take 240 mg by mouth at bedtime.   Yes Historical Provider, MD  Vitamin D, Ergocalciferol, (DRISDOL) 50000 UNITS CAPS capsule Take 50,000 Units by mouth every 7 (seven) days.   Yes Historical  Provider, MD  Fluticasone Furoate-Vilanterol (BREO ELLIPTA) 100-25 MCG/INH AEPB 1 puff, once daily and rinse mouth- maintenance Patient not taking: Reported on 09/04/2014 05/03/14   Deneise Lever, MD  guaiFENesin (MUCINEX) 600 MG 12 hr tablet Take 1 tablet (600 mg total) by mouth 2 (two) times daily as needed. For cough Patient not taking: Reported on 09/04/2014 07/20/14   Donielle Liston Alba, PA-C  HYDROcodone-acetaminophen (NORCO) 7.5-325 MG per tablet Take 1 tablet by mouth every 6 (six) hours as needed for moderate pain. Patient not taking: Reported on 09/04/2014 08/24/14   Gaye Pollack, MD  meclizine (ANTIVERT) 25 MG tablet Take 25 mg by mouth 3 (three) times daily as needed for dizziness.    Historical Provider, MD  oxyCODONE (OXY IR/ROXICODONE) 5 MG immediate release tablet Take 1-2 tablets (5-10 mg total) by mouth every 6 (six) hours as needed for severe pain. Patient not taking: Reported on 09/04/2014 08/11/14   Gaye Pollack, MD   BP 126/39 mmHg  Pulse 77  Temp(Src) 97.8 F (36.6 C) (Oral)  Resp 18  SpO2 95%  LMP 06/04/2011 Physical Exam  Constitutional: She is oriented to person, place, and time. She appears well-developed and well-nourished. No distress.  HENT:  Head: Normocephalic and atraumatic.  Moist mucous membranes  Eyes: Conjunctivae are normal. Pupils are equal, round, and reactive to light.  Neck: Neck supple.  Cardiovascular: Normal rate, regular rhythm and normal heart sounds.   No murmur heard. Pulmonary/Chest: Effort normal and breath sounds normal. No respiratory distress.  Diminished BS R lower lung, no crackles; significant tenderness  with light touch to R lateral chest wall  Abdominal: Soft. Bowel sounds are normal. She exhibits no distension. There is no tenderness.  Musculoskeletal: She exhibits no edema.  Neurological: She is alert and oriented to person, place, and time.  Fluent speech  Skin: Skin is warm and dry.  Psychiatric: She has a normal mood and affect.  Nursing note and vitals reviewed.   ED Course  Procedures (including critical care time) Labs Review Labs Reviewed  BASIC METABOLIC PANEL - Abnormal; Notable for the following:    Glucose, Bld 109 (*)    BUN 21 (*)    Calcium 8.8 (*)    All other components within normal limits  CBC  TROPONIN I    Imaging Review Dg Chest 2 View  09/04/2014   CLINICAL DATA:  Shortness of breath would right lower quadrant pain in midsternal chest pain as well as a upper back pain. History of lung cancer. Previous right lower lobectomy 07/15/2014. History of breast cancer.  EXAM: CHEST  2 VIEW  COMPARISON:  08/11/2014  FINDINGS: Exam demonstrates volume loss of the right lower lung with a small to moderate pleural fluid collection with slight improvement compared to the prior exam. Remainder of the lungs are clear. Cardiomediastinal silhouette and remainder of the exam is unchanged.  IMPRESSION: Postsurgical volume loss right lower lung with small to moderate right pleural fluid collection slightly improved.   Electronically Signed   By: Marin Olp M.D.   On: 09/04/2014 20:22     EKG Interpretation   Date/Time:  Saturday September 04 2014 19:41:12 EDT Ventricular Rate:  84 PR Interval:  143 QRS Duration: 87 QT Interval:  382 QTC Calculation: 451 R Axis:   53 Text Interpretation:  Sinus rhythm No significant change since last  tracing Confirmed by LITTLE MD, RACHEL ([id number]) on 09/04/2014 7:58:59 PM      MDM   Final  diagnoses:  None   shortness of breath Right-sided chest pain Lung CA s/p resection  69yo F w/ PMH including recent lung CA resection and history  of PE presents with right-sided chest pain and shortness of breath that has been present since surgery but has been worse over the past few days. Patient well-appearing with reassuring vital signs at presentation. EKG on arrival without evidence of ischemia. Chest x-ray shows improvement of right pleural effusion from surgery. Labs including troponin unremarkable. Given the patient's PE history and recent surgery, obtained a CT of the chest to rule out PE. Premedicated the patient with Benadryl and Solu-Medrol, as she tolerated a CT in 2014 on this regimen. Also gave pt percocet for pain.  Patient's pain is reproducible with very light palpation and also reproducible with movement such as sitting up in bed. She has had the pain since surgery and states that it was just worse today. Given this, I feel that ACS is very unlikely. I am signing the patient out to the oncoming physician. I anticipate that if the patient's CT scan is unremarkable, she will likely be able to go home with PCP, surgery, and pain clinic follow-up.   Sharlett Iles, MD 09/04/14 [phone]

## 2014-09-04 NOTE — ED Notes (Signed)
Patient transported to X-ray 

## 2014-09-05 DIAGNOSIS — G8918 Other acute postprocedural pain: Secondary | ICD-10-CM | POA: Diagnosis not present

## 2014-09-08 ENCOUNTER — Ambulatory Visit (INDEPENDENT_AMBULATORY_CARE_PROVIDER_SITE_OTHER): Payer: Medicare Other | Admitting: Internal Medicine

## 2014-09-08 ENCOUNTER — Encounter: Payer: Self-pay | Admitting: Internal Medicine

## 2014-09-08 VITALS — BP 124/76 | HR 85 | Ht 66.0 in | Wt 227.8 lb

## 2014-09-08 DIAGNOSIS — J449 Chronic obstructive pulmonary disease, unspecified: Secondary | ICD-10-CM | POA: Diagnosis not present

## 2014-09-08 DIAGNOSIS — Z9889 Other specified postprocedural states: Secondary | ICD-10-CM | POA: Diagnosis not present

## 2014-09-08 DIAGNOSIS — Z902 Acquired absence of lung [part of]: Secondary | ICD-10-CM

## 2014-09-08 DIAGNOSIS — J984 Other disorders of lung: Secondary | ICD-10-CM | POA: Diagnosis not present

## 2014-09-08 DIAGNOSIS — J8410 Pulmonary fibrosis, unspecified: Secondary | ICD-10-CM

## 2014-09-08 MED ORDER — IPRATROPIUM-ALBUTEROL 0.5-2.5 (3) MG/3ML IN SOLN: 3.0000 mL | Freq: Four times a day (QID) | RESPIRATORY_TRACT | Status: DC | PRN

## 2014-09-08 NOTE — Progress Notes (Signed)
01/15/12- 89 yoF heavy smoker recently hosp for exacerbation of COPD and now referred for pulmonary management. Hospitalized 11/29 through 01/12/2012 with discharge diagnoses of acute respiratory failure with hypoxia, COPD, tobacco abuse.. She had moved to this area from the [place] in 2009. Since here she has had more bronchitis with season change. Recent hospital for typical exacerbation. Now in physical therapy for rehabilitation after bilateral total knee replacements (05/15/2011, 10/14/2011). She has smoked one pack per day since 1967 and states that she stopped smoking November 28, using patches. Breathing is better now than usual from prehospital baseline. She never has had a cough. Activity has been limited by her knees but she thinks she has less dyspnea on exertion now. She is not needing her Combivent but continues Spiriva. A right lower lobe nodule was found April 2013. CT chest 12/21/2011 described as stable well-circumscribed right lower lobe nodule and recommended PET. Noted diffuse old granulomatous disease with calcification including calcified mediastinal and hilar nodes, granulomas in spleen and calcified nodes in the upper abdomen. No history of TB exposure. Pulmonary embolism 2006/IVC filter. Negative VQ scan 10/21/2011. She reports having a PET scan through [facility] in [place] this summer. We need to verify that and get that report.  Treated for hypertension but denies heart disease. 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 Adopted. CT 12/21/11-reviewed IMPRESSION:  1. Stable well circumscribed nodule within the right lower lobe.  Consider PET CT for metabolic activity if indicated clinically.  2. Diffuse changes of prior granulomatous disease with calcified  left lower lobe granulomas, mediastinal and hilar calcified nodes,  calcified splenic granuloma, and calcified nodes in the upper  abdomen.  Original Report Authenticated By: Ivar Drape,  M.D.   05/16/12- 37 yoF former heavy smoker followed for  COPD, lung nodule RLL  FOLLOWS FOR: pt states breathing is doing good overall(used Rescue inhaler once since hospital stay); review PFT and mammogram results with patient. States she quit smoking cigarettes in November, 2013. Had CT chest 12/21/2011 and PET scan at [facility],  her pulmonologist at [facility] retired. Now denies shortness of breath, cough, phlegm wheeze. Using Spiriva daily. History of bilateral lumpectomy. Mammograms were negative 01/23/2012.  CXR 01/04/12 IMPRESSION:  Coarse lung markings most likely represent chronic changes but  difficult to exclude mild edema. There is no focal airspace  disease.  Nodule in the right lower lung. This was better characterized on  the chest CT from 12/21/2011.  Original Report Authenticated By: Markus Daft, M.D. PFT 05/16/2012: Mild obstructive airways disease with insignificant response to bronchodilator, normal lung volumes, diffusion mildly reduced. FVC 3.08/96%, FEV1 2.01/86%, FEV1/FVC 0.65. TLC 95%, DLCO 73%.  07/07/12- 75 yoF former heavy smoker followed for  COPD, lung nodule RLL , Hx PE/ IVC filter FOLLOWS FOR: Denies any SOB, wheezing, cough, or congestion; overall breathing is doing good per patient. Tells me in past year she has had TKR both knees and lumbar spine sgy. She gives additional medical hx of PE x2 w/ IVC filter 2006. 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 Fibromyalgia. 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 Says breathing "fine". Discussed f/u nodules.  01/06/13- 55 yoF former heavy smoker followed for  COPD, lung nodule RLL , Hx PE/ IVC filter FOLLOWS FOR:at primary md 1 wk. ago had rales,sob-worse,chest pain and tightness,cough-yellow x 2 wks.,wheezing ,fcs  Says she felt this way last year in  the fall. Recent chest congestion, cough with clear or trace yellow, tight,  back pain off and on, some low-grade fever and sweats. Using rescue inhaler for the first time in a year. Not smoking. CT chest 08/13/12 IMPRESSION:  Stable 1.9 cm smoothly marginated ovoid lesion in the right lower  lobe. IV contrast was administered today and this does not appear  to represent an AV malformation. The nodule also shows no evidence (IR did not do needle bx.) of contrast enhancement. Given all of these factors as well as the  presence of multiple abutting blood vessels which may make the  biopsy of higher risk, decision was made to not perform a  percutaneous biopsy today and follow the lesion by imaging.  Findings were discussed directly with Dr. Annamaria Boots.  Original Report Authenticated By: Aletta Edouard, M.D. PET elsewhere 06/29/11- IVC filter, old granulomatous disease. RLL nodule 18/16 mm, SUV 1.4 PET 07/16/12 IMPRESSION:  1.9 x 1.8 cm right lower lobe pulmonary nodule, with suspected slow  interval growth and associated vague hypermetabolism, max SUV 2.5.  While indeterminate, the overall appearance is worrisome for  primary bronchogenic neoplasm. Consider bronchoscopy or  percutaneous biopsy for further evaluation.  No evidence of metastatic disease.  Original Report Authenticated By: Julian Hy, M.D  07/14/13- 72 yoF former heavy smoker followed for  COPD, lung nodule RLL , Hx PE/ IVC filter, hx discoid lupus rash FOLLOWS FOR:No trouble with breathing as long as she is not in pain. Per note last visit, IR did not bx nodule. Moved to old home with many steps. Using Combivent less than once daily while continuing Spiriva. Discoid lupus rash is worse in heat CT 07/14/13 IMPRESSION:  Old granulomatous disease.  Stable 3 mm RIGHT upper lobe noncalcified pulmonary nodule.  Stable lobulated RIGHT lower lobe mass ; due to growth on prior exam  and hypermetabolism on PET-CT, recommend continued surveillance to  exclude malignancy.  No new intrathoracic abnormalities.   Electronically Signed  By: Lavonia Dana M.D.  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 Tried Spiriva but couldn't manage the dispenser well. She relates dyspnea on exertion to obesity and deconditioning, noticed mainly on stairs.  06/08/14- 68 yoF former heavy smoker (quit 3-4 yrs ago), former scrub nurse followed for  COPD, lung nodule RLL/ Carcinoid, 74m RUL nodule , Hx PE/ IVC filter, hx discoid lupus rash, hx bilateral breast Ca lumpectomies Guided bronch/ Dr BLamonte Sakai+ carcinoid RLL.  Friend and son here, daughter was on speaker phone. Since bronch she has had focal R chest pain in mid clavicular line, tender to pressure but not pleuritic. Cough w scant clear mucus and hoarseness from  Bronch are clearing. Had some loose stools ? Dark. CT chest 05/05/14 IMPRESSION: Continued interval growth of the right lower lobe pulmonary nodule, now measuring 2.6 cm in diameter. Neoplasm remains a concern. Otherwise stable exam. Electronically Signed  By: EMisty StanleyM.D.  On: 05/05/2014 15:30  09/08/14- 69 yoF former heavy smoker (quit 3-4 yrs ago), former scrub nurse followed for  COPD, lung nodule RLL/ Carcinoid, 347mRUL nodule , Hx PE/ IVC filter, hx discoid lupus rash, hx bilateral breast Ca lumpectomies, old calcified granulomas FOLLOWS FOR: Pt states  she is out of [place] and worked great-can not get medication due to coverage. Pt has fallen twice since surgery-unsure why. Most recent event was 3 days ago.   Son here Uses cane after bilat TKR. Hooked toe walking and tripped. Still having R post thoracotomy pain, slowly easing. PFT 06/14/14- Minimal obstructive disease, insig response to BD, Minimal  Diffusion defect. FVC 2.90/ 83%, 2.16/ 81%, FEV1/FVC 0.75, TLC 95%, DLCO 72%. Occasional wheeze. Discussed exercise. CT chest 09/04/14 IMPRESSION: 1. Negative for pulmonary embolus 2. Right lower lobectomy with treatment related changes 3. Left thyroid 3 cm mass or cyst. Recommend thyroid sonography for Characterization.-------> Her PCP to manage this Electronically Signed  By: Andreas Newport M.D.  On: 09/04/2014 23:34  ROS-see HPI Constitutional:   No-   weight loss, night sweats, +fevers, chills, fatigue, lassitude. HEENT:   No-  headaches, difficulty swallowing, tooth/dental problems, sore throat,       No-  sneezing, itching, ear ache, nasal congestion, post nasal drip,  CV:  +chest pain, orthopnea, PND, swelling in lower extremities, anasarca,  dizziness, palpitations Resp: +shortness of breath with exertion or at rest.              +cough,  No non-productive cough,  No- coughing up of blood.              No-   change in color of mucus.  + wheezing.   Skin: + rash or lesions. GI:  No-   heartburn, indigestion, abdominal pain, nausea, vomiting,  GU: . MS:  No-   joint pain or swelling. Neuro-     nothing unusual Psych:  No- change in mood or affect. No depression or anxiety.  No memory loss.  OBJ- Physical Exam General- Alert, Oriented, Affect-appropriate, Distress- none acute, +obese Skin- + rash dorsum left hand reported discoid lupus Lymphadenopathy- none Head- atraumatic            Eyes- Gross vision intact, PERRLA, conjunctivae and secretions clear            Ears- Hearing, canals-normal            Nose- Clear, no-Septal dev, mucus, polyps, erosion, perforation             Throat- Mallampati II , mucosa clear , drainage- none, tonsils- atrophic. Dentures Neck- flexible , trachea midline, no stridor , thyroid nl, carotid no bruit Chest - symmetrical excursion , unlabored           Heart/CV- RRR , no murmur , no gallop  , no rub, nl s1 s2                           -  JVD- none , edema- none, stasis changes- none, varices- none           Lung- clear to P&A, wheeze- none, cough- none , dullness-none, rub- none           Chest wall- +tender to finger pressure R low ant rib in MCL Abd-  Br/ Gen/ Rectal- Not done, not indicated Extrem- cyanosis- none, clubbing, none, atrophy- none, strength- nl.               + bilateral TKR scars, Cane Neuro- grossly intact to observation

## 2014-09-08 NOTE — Patient Instructions (Addendum)
Order- new DME script- home nebulizer Duoneb to use for wheezing/ shortness of breath   [facility] and [facility] groups [facility] for increasing stamina and strength.  You can call and ask.  Keep appointment in late September

## 2014-09-08 NOTE — Assessment & Plan Note (Signed)
PFT slightly improved. Notices wheeze. Suggested we try nebulizer for her, supplemented by her combivent when away from home. Encourage walking, Silver Sneakers etc to build stamina

## 2014-09-08 NOTE — Assessment & Plan Note (Signed)
CT 08/2014 cw old calcified granulomatous disease

## 2014-09-08 NOTE — Assessment & Plan Note (Signed)
Post-thoracotomy pain, improving. Reassured

## 2014-09-09 ENCOUNTER — Telehealth: Payer: Self-pay | Admitting: Internal Medicine

## 2014-09-09 NOTE — Telephone Encounter (Signed)
Form received and placed on [facility]'s cart to be signed

## 2014-09-10 NOTE — Telephone Encounter (Signed)
Magda Paganini faxed this form back yesterday and we have placed in scan folder of [facility]'s/

## 2014-09-10 NOTE — Telephone Encounter (Signed)
Did [facility] return this form to you? Thanks.

## 2014-09-24 ENCOUNTER — Emergency Department (HOSPITAL_COMMUNITY)
Admission: EM | Admit: 2014-09-24 | Discharge: 2014-09-24 | Disposition: A | Discharge status: Home / Self Care | Payer: Medicare Other | Attending: Emergency Medicine | Admitting: Emergency Medicine

## 2014-09-24 ENCOUNTER — Emergency Department (HOSPITAL_BASED_OUTPATIENT_CLINIC_OR_DEPARTMENT_OTHER): Payer: Medicare Other

## 2014-09-24 ENCOUNTER — Emergency Department (HOSPITAL_COMMUNITY): Payer: Medicare Other

## 2014-09-24 ENCOUNTER — Encounter (HOSPITAL_COMMUNITY): Payer: Self-pay | Admitting: *Deleted

## 2014-09-24 DIAGNOSIS — M25562 Pain in left knee: Secondary | ICD-10-CM | POA: Insufficient documentation

## 2014-09-24 DIAGNOSIS — F4310 Post-traumatic stress disorder, unspecified: Secondary | ICD-10-CM | POA: Diagnosis not present

## 2014-09-24 DIAGNOSIS — R1030 Lower abdominal pain, unspecified: Secondary | ICD-10-CM | POA: Diagnosis present

## 2014-09-24 DIAGNOSIS — Z8701 Personal history of pneumonia (recurrent): Secondary | ICD-10-CM | POA: Insufficient documentation

## 2014-09-24 DIAGNOSIS — F419 Anxiety disorder, unspecified: Secondary | ICD-10-CM | POA: Insufficient documentation

## 2014-09-24 DIAGNOSIS — Z87442 Personal history of urinary calculi: Secondary | ICD-10-CM | POA: Insufficient documentation

## 2014-09-24 DIAGNOSIS — I10 Essential (primary) hypertension: Secondary | ICD-10-CM | POA: Insufficient documentation

## 2014-09-24 DIAGNOSIS — M797 Fibromyalgia: Secondary | ICD-10-CM | POA: Diagnosis not present

## 2014-09-24 DIAGNOSIS — M25552 Pain in left hip: Secondary | ICD-10-CM

## 2014-09-24 DIAGNOSIS — Z79899 Other long term (current) drug therapy: Secondary | ICD-10-CM | POA: Diagnosis not present

## 2014-09-24 DIAGNOSIS — Z87891 Personal history of nicotine dependence: Secondary | ICD-10-CM | POA: Diagnosis not present

## 2014-09-24 DIAGNOSIS — M79605 Pain in left leg: Secondary | ICD-10-CM

## 2014-09-24 DIAGNOSIS — Z86711 Personal history of pulmonary embolism: Secondary | ICD-10-CM | POA: Diagnosis not present

## 2014-09-24 DIAGNOSIS — E785 Hyperlipidemia, unspecified: Secondary | ICD-10-CM | POA: Insufficient documentation

## 2014-09-24 DIAGNOSIS — J441 Chronic obstructive pulmonary disease with (acute) exacerbation: Secondary | ICD-10-CM | POA: Insufficient documentation

## 2014-09-24 DIAGNOSIS — Z86010 Personal history of colonic polyps: Secondary | ICD-10-CM | POA: Diagnosis not present

## 2014-09-24 DIAGNOSIS — Z853 Personal history of malignant neoplasm of breast: Secondary | ICD-10-CM | POA: Diagnosis not present

## 2014-09-24 DIAGNOSIS — Z8719 Personal history of other diseases of the digestive system: Secondary | ICD-10-CM | POA: Insufficient documentation

## 2014-09-24 DIAGNOSIS — M79609 Pain in unspecified limb: Secondary | ICD-10-CM | POA: Diagnosis not present

## 2014-09-24 MED ORDER — METHOCARBAMOL 500 MG PO TABS: 500.0000 mg | ORAL_TABLET | Freq: Two times a day (BID) | ORAL | Status: DC

## 2014-09-24 MED ORDER — OXYCODONE-ACETAMINOPHEN 5-325 MG PO TABS
1.0000 | ORAL_TABLET | Freq: Once | ORAL | Status: AC
  Administered 2014-09-24: 1 via ORAL
  Filled 2014-09-24: qty 1

## 2014-09-24 NOTE — ED Notes (Signed)
Bed: WA02 Expected date:  Expected time:  Means of arrival:  Comments: 

## 2014-09-24 NOTE — ED Notes (Signed)
Delay: Patient requesting pain medication prior to transport to xray.

## 2014-09-24 NOTE — ED Notes (Addendum)
Pt reports hx of PEs in 2006, has filter now. Pt had 1 right lobe removed on June 9 due to lung tumor. Pt reports 3 weeks ago she fell. Hx of Bil knee surgeries. Since fall left knee pain. Left groin pain x1 week, pain has increased over time. SOB started last night.   Pt reports difficulty urinating due to pain.

## 2014-09-24 NOTE — Progress Notes (Signed)
VASCULAR LAB PRELIMINARY  PRELIMINARY  PRELIMINARY  PRELIMINARY  Left lower extremity venous duplex completed.    Preliminary report:  Left:  No evidence of DVT, superficial thrombosis, or Baker's cyst.  Breanna Dixon, RVS 09/24/2014, 2:41 PM

## 2014-09-24 NOTE — Discharge Instructions (Signed)
Robaxin for muscle spasms. Try to move, stretch, follow up with primary care doctor for recheck. No evidence of a clot or fracture today.    Arthralgia Your caregiver has diagnosed you as suffering from an arthralgia. Arthralgia means there is pain in a joint. This can come from many reasons including:  Bruising the joint which causes soreness (inflammation) in the joint.  Wear and tear on the joints which occur as we grow older (osteoarthritis).  Overusing the joint.  Various forms of arthritis.  Infections of the joint. Regardless of the cause of pain in your joint, most of these different pains respond to anti-inflammatory drugs and rest. The exception to this is when a joint is infected, and these cases are treated with antibiotics, if it is a bacterial infection. HOME CARE INSTRUCTIONS   Rest the injured area for as long as directed by your caregiver. Then slowly start using the joint as directed by your caregiver and as the pain allows. Crutches as directed may be useful if the ankles, knees or hips are involved. If the knee was splinted or casted, continue use and care as directed. If an stretchy or elastic wrapping bandage has been applied today, it should be removed and re-applied every 3 to 4 hours. It should not be applied tightly, but firmly enough to keep swelling down. Watch toes and feet for swelling, bluish discoloration, coldness, numbness or excessive pain. If any of these problems (symptoms) occur, remove the ace bandage and re-apply more loosely. If these symptoms persist, contact your caregiver or return to this location.  For the first 24 hours, keep the injured extremity elevated on pillows while lying down.  Apply ice for 15-20 minutes to the sore joint every couple hours while awake for the first half day. Then 03-04 times per day for the first 48 hours. Put the ice in a plastic bag and place a towel between the bag of ice and your skin.  Wear any splinting, casting,  elastic bandage applications, or slings as instructed.  Only take over-the-counter or prescription medicines for pain, discomfort, or fever as directed by your caregiver. Do not use aspirin immediately after the injury unless instructed by your physician. Aspirin can cause increased bleeding and bruising of the tissues.  If you were given crutches, continue to use them as instructed and do not resume weight bearing on the sore joint until instructed. Persistent pain and inability to use the sore joint as directed for more than 2 to 3 days are warning signs indicating that you should see a caregiver for a follow-up visit as soon as possible. Initially, a hairline fracture (break in bone) may not be evident on X-rays. Persistent pain and swelling indicate that further evaluation, non-weight bearing or use of the joint (use of crutches or slings as instructed), or further X-rays are indicated. X-rays may sometimes not show a small fracture until a week or 10 days later. Make a follow-up appointment with your own caregiver or one to whom we have referred you. A radiologist (specialist in reading X-rays) may read your X-rays. Make sure you know how you are to obtain your X-ray results. Do not assume everything is normal if you do not hear from [place]. SEEK MEDICAL CARE IF: Bruising, swelling, or pain increases. SEEK IMMEDIATE MEDICAL CARE IF:   Your fingers or toes are numb or blue.  The pain is not responding to medications and continues to stay the same or get worse.  The pain in your  joint becomes severe.  You develop a fever over 102 F (38.9 C).  It becomes impossible to move or use the joint. MAKE SURE YOU:   Understand these instructions.  Will watch your condition.  Will get help right away if you are not doing well or get worse. Document Released: 01/22/2005 Document Revised: 04/16/2011 Document Reviewed: 09/10/2007 [facility] Patient Information 2015 [place], [place]. This information is  not intended to replace advice given to you by your health care provider. Make sure you discuss any questions you have with your health care provider.

## 2014-09-24 NOTE — ED Notes (Signed)
PA at bedside.

## 2014-09-24 NOTE — ED Provider Notes (Signed)
CSN: [id number]     Arrival date & time 09/24/14  0941 History   First MD Initiated Contact with Patient 09/24/14 1056     Chief Complaint  Patient presents with  . Groin Pain  . Fall, SOB, hx of PE      (Consider location/radiation/quality/duration/timing/severity/associated sxs/prior Treatment) HPI Breanna Dixon is a 69 y.o. female with multiple medical issues including recent thoracotomy, 07/15/2014, for lung carcinoma, history of PE with IVC filter placed at 2006, hypertension, COPD, fibromyalgia, presents to emergency department complaining of pain in the left leg. Patient states she fell 2 weeks ago after her foot was tangled in the sheets. She states that she is unsure how she landed. She says that she was evaluated in the emergency department however cannot find a record of that. She states that she is having pain in the left hip, left knee. She states she is unable to walk due to pain. She states she has history of a DVT in the past and feels like this feels similar to that. She denies taking any pain medications for this complaint. She denies any fever, chills. She denies any other complaints at this time.  Past Medical History  Diagnosis Date  . Seasonal allergies     flonase prn  . Lung mass April 2013    Right lower lobe lung mass  . Arthritis   . Pulmonary embolism 2006    IVC filter  . PTSD (post-traumatic stress disorder)     takes Cymbalta daily  . Anxiety     takes Ativan daily  . Fibromyalgia     takes Lyrica daily  . History of bronchitis   . Hyperlipidemia     takes Fenofibrate daily  . COPD (chronic obstructive pulmonary disease)   . Headache(784.0)     takes Tegretol and Verapamil nightly  . Hypertension     takes Toprol daily  . Hypertension   . Vertigo     takes Antivert prn  . Lupus     takes Cymbalta,Plaquenil  daily  . Joint pain   . History of ulcer disease     pyloric   . History of colon polyps     benign  . History of blood transfusion      no abnormal reaction noted  . Pneumonia     hx of  . History of kidney stones   . Cancer     breast hx  . Bleeding ulcer 1974   Past Surgical History  Procedure Laterality Date  . Cholecystectomy  1994  . Back surgery  2000/2010    fusion  . Tonsillectomy  1967  . Appendectomy  1965  . Cystoscopy      multiple  . Urethral dilation  1971  . Abdominal hysterectomy  1992  . Dilation and curettage of uterus    . Colonoscopy    . Knee arthroscopy Bilateral   . Total knee arthroplasty  06/04/2011    Procedure: TOTAL KNEE ARTHROPLASTY;  Surgeon: Rudean Haskell, MD;  Location: [facility];  Service: Orthopedics;  Laterality: Right;  . Vena cava filter placement  2006  . Incisional hernia repair    . Multiple tooth extractions  1990  . Total knee arthroplasty  10/15/2011    Procedure: TOTAL KNEE ARTHROPLASTY;  Surgeon: Rudean Haskell, MD;  Location: [facility];  Service: Orthopedics;  Laterality: Left;  . Ivc filter    . Video bronchoscopy with endobronchial navigation N/A 06/02/2014    Procedure:  VIDEO BRONCHOSCOPY WITH ENDOBRONCHIAL NAVIGATION;  Surgeon: Collene Gobble, MD;  Location: [facility];  Service: Thoracic;  Laterality: N/A;  . Breast surgery      bilateral partial masectomy  . Hernia repair    . Joint replacement Bilateral     Knees  . Cardiac catheterization  1992    no PCI  . Thoracotomy/lobectomy Right 07/15/2014    Procedure: RIGHT THORACOTOMY/RIGHT LOWER LOBE  LOBECTOMY;  Surgeon: Gaye Pollack, MD;  Location: [facility];  Service: Thoracic;  Laterality: Right;   Family History  Problem Relation Age of Onset  . Adopted: Yes  . Anesthesia problems Neg Hx   . Hypotension Neg Hx   . Malignant hyperthermia Neg Hx   . Pseudochol deficiency Neg Hx    Social History  Substance Use Topics  . Smoking status: Former Smoker -- 0.75 packs/day for 7 years    Types: Cigarettes    Quit date: 06/15/2012  . Smokeless tobacco: Never Used     Comment: quit smoking 44yr ago  . Alcohol Use:  No   OB History    No data available     Review of Systems  Constitutional: Negative for fever and chills.  Respiratory: Negative for cough, chest tightness and shortness of breath.   Cardiovascular: Negative for chest pain, palpitations and leg swelling.  Gastrointestinal: Negative for nausea, vomiting, abdominal pain and diarrhea.  Musculoskeletal: Positive for myalgias and arthralgias. Negative for neck pain and neck stiffness.  Skin: Negative for rash.  Neurological: Negative for dizziness, numbness and headaches.  All other systems reviewed and are negative.     Allergies  Imitrex; Ketorolac tromethamine; Stadol; Ivp dye; and Shellfish allergy  Home Medications   Prior to Admission medications   Medication Sig Start Date End Date Taking? Authorizing Provider  acetaminophen (TYLENOL) 500 MG tablet Take 500 mg by mouth every 6 (six) hours as needed for headache. Takes two tablets if needed   Yes Historical Provider, MD  carbamazepine (TEGRETOL XR) 200 MG 12 hr tablet Take 200-400 mg by mouth 2 (two) times daily. Takes 1 in the morning and 2 at bedtime   Yes Historical Provider, MD  clotrimazole-betamethasone (LOTRISONE) cream Apply 1 application topically 2 (two) times daily as needed (skin irritation).    Yes Historical Provider, MD  COMBIVENT RESPIMAT 20-100 MCG/ACT AERS respimat INHALE TWO PUFFS INTO LUNGS  EVERY 6 HOURS AS NEEDED FOR SHORTNESS OF BREATH 08/06/14  Yes CDeneise Lever MD  DULoxetine (CYMBALTA) 60 MG capsule Take 120 mg by mouth daily. Daily at bedtime   Yes Historical Provider, MD  fenofibrate micronized (LOFIBRA) 134 MG capsule Take 134 mg by mouth daily before breakfast.   Yes Historical Provider, MD  furosemide (LASIX) 20 MG tablet Take 1 tablet (20 mg total) by mouth daily. 01/12/12  Yes GOswald Hillock MD  hydroxychloroquine (PLAQUENIL) 200 MG tablet Take 200 mg by mouth 2 (two) times daily.   Yes Historical Provider, MD  ipratropium-albuterol (DUONEB) 0.5-2.5  (3) MG/3ML SOLN Take 3 mLs by nebulization every 6 (six) hours as needed. 09/08/14  Yes CDeneise Lever MD  LORazepam (ATIVAN) 2 MG tablet Take 2 mg by mouth 3 (three) times daily as needed for anxiety.    Yes Historical Provider, MD  metoprolol succinate (TOPROL-XL) 100 MG 24 hr tablet Take 1 tablet (100 mg total) by mouth daily. Take with or immediately following a meal. 01/12/12  Yes GOswald Hillock MD  pregabalin (LYRICA) 75 MG capsule  Take 75 mg by mouth 2 (two) times daily.   Yes Historical Provider, MD  verapamil (COVERA HS) 240 MG (CO) 24 hr tablet Take 240 mg by mouth at bedtime.   Yes Historical Provider, MD  Vitamin D, Ergocalciferol, (DRISDOL) 50000 UNITS CAPS capsule Take 50,000 Units by mouth every 7 (seven) days.   Yes Historical Provider, MD   BP 113/53 mmHg  Pulse 78  Temp(Src) 98.4 F (36.9 C) (Oral)  Resp 16  Ht '5\' 8"'$  (1.727 m)  Wt 234 lb 6 oz (106.312 kg)  BMI 35.64 kg/m2  SpO2 98%  LMP 06/04/2011 Physical Exam  Constitutional: She is oriented to person, place, and time. She appears well-developed and well-nourished. No distress.  HENT:  Head: Normocephalic.  Eyes: Conjunctivae are normal.  Neck: Neck supple.  Cardiovascular: Normal rate, regular rhythm and normal heart sounds.   Pulmonary/Chest: Effort normal and breath sounds normal. No respiratory distress. She has no wheezes. She has no rales.  Abdominal: Soft. Bowel sounds are normal. She exhibits no distension. There is no tenderness. There is no rebound.  Musculoskeletal:  Normal-appearing left leg with no obvious swelling, deformity. Tenderness to palpation over her left calf, positive Homan sign. Pain with range of motion of the left ankle and left knee. Pain with any range of motion of the left hip. Dorsal pedal pulses are equal and intact bilaterally. Foot is warm.  Neurological: She is alert and oriented to person, place, and time.  Skin: Skin is warm and dry.  Psychiatric: She has a normal mood and affect.  Her behavior is normal.  Nursing note and vitals reviewed.   ED Course  Procedures (including critical care time) Labs Review Labs Reviewed - No data to display  Imaging Review Dg Chest 2 View  09/24/2014   CLINICAL DATA:  Fall 2 weeks ago. Left lower extremity pain. Personal history of PE.  EXAM: CHEST - 2 VIEW  COMPARISON:  CTA of the chest 09/04/2014.  FINDINGS: The heart is mildly enlarged. Right lower lobectomy is noted with chronic changes at the right base. Mild pulmonary vascular congestion is again noted bilaterally. There is no frank edema or effusion. No focal airspace disease is present. Mildly exaggerated thoracic kyphosis is stable. The visualized soft tissues and bony thorax are otherwise unremarkable.  IMPRESSION: 1. Mild pulmonary vascular congestion without frank edema. 2. Otherwise stable chest x-ray   Electronically Signed   By: San Morelle M.D.   On: 09/24/2014 13:23   Dg Knee Complete 4 Views Left  09/24/2014   CLINICAL DATA:  [place] 2 weeks ago.  Persistent left knee pain.  EXAM: LEFT KNEE - COMPLETE 4+ VIEW  COMPARISON:  None.  FINDINGS: The femoral and tibial components appear well seated without complicating features. No acute fractures identified. Minimal lucency around the medial femoral condyle of all uncertain chronicity. No joint effusion.  IMPRESSION: No acute fracture or joint effusion.   Electronically Signed   By: Marijo Sanes M.D.   On: 09/24/2014 13:26   Dg Hip Unilat With Pelvis 2-3 Views Left  09/24/2014   CLINICAL DATA:  Fall 2 weeks ago with left groin pain.  EXAM: DG HIP (WITH OR WITHOUT PELVIS) 2-3V LEFT  COMPARISON:  05/14/2014  FINDINGS: Surgical hardware in the lower lumbar spine. Small sclerotic densities in the left pubic rami are suggestive for bone islands. The pelvic bony ring is intact. Left hip is located without a fracture. Moderate amount of stool in the lower abdomen.  IMPRESSION:  No acute bone abnormality in the pelvis or left hip.    Electronically Signed   By: Markus Daft M.D.   On: 09/24/2014 13:25   I have personally reviewed and evaluated these images and lab results as part of my medical decision-making.   EKG Interpretation   Date/Time:  Friday September 24 2014 10:54:08 EDT Ventricular Rate:  72 PR Interval:  152 QRS Duration: 84 QT Interval:  407 QTC Calculation: 445 R Axis:   57 Text Interpretation:  Sinus rhythm When compared with ECG of 09/04/2014 No  significant change was found Confirmed by [facility]  MD, Nunzio Cory ([id number]) on  09/24/2014 11:29:22 AM      MDM   Final diagnoses:  Left hip pain  Left leg pain    Patient with left leg pain after a fall 2 weeks ago. Exam is positive for pain over left knee and hip with range of motion of the leg, no obvious swelling or bruising noted. She is neurovascularly intact. History of DVTs. Will get venous Doppler study, x-rays of the knee and the hip. Patient given Percocet for pain.   Patient's x-rays are negative. Patient was able to ambulate up and down the hallway and use the bathroom independently. Patient's venous Doppler is negative. Patient appears to be sedated in emergency department, I refused to give her any more pain medications because of this, I do not want her to fall. Patient requesting pain medicine every few minutes and every time I walk in the room. I explained to her that this is a injury from 2 weeks ago, and narcotic pain medications are not indicated at this time given no objective findings on exam or imaging. Patient voiced understanding and is comfortable with being discharged home.  Filed Vitals:   09/24/14 1002 09/24/14 1227 09/24/14 1504  BP: 113/53 130/47 131/56  Pulse: 78 69 77  Temp: 98.4 F (36.9 C) 97.9 F (36.6 C) 98 F (36.7 C)  TempSrc: Oral Oral Oral  Resp: '16 21 17  '$ Height: '5\' 8"'$  (1.727 m)    Weight: 234 lb 6 oz (106.312 kg)    SpO2: 98% 98% 97%       Jeannett Senior, PA-C 09/24/14 [place],  DO 09/26/14 1428

## 2014-09-24 NOTE — ED Notes (Signed)
Assisted patient to bathroom via wheelchair.  No difficulties in ambulating to toilet while in bathroom.  Patient back to bed without issues.  Reports some relief from pain medicine at present.

## 2014-10-22 ENCOUNTER — Emergency Department (HOSPITAL_COMMUNITY)
Admission: EM | Admit: 2014-10-22 | Discharge: 2014-10-23 | Disposition: A | Discharge status: Home / Self Care | Payer: Medicare Other | Attending: Emergency Medicine | Admitting: Emergency Medicine

## 2014-10-22 ENCOUNTER — Emergency Department (HOSPITAL_COMMUNITY): Payer: Medicare Other

## 2014-10-22 ENCOUNTER — Encounter (HOSPITAL_COMMUNITY): Payer: Self-pay | Admitting: Emergency Medicine

## 2014-10-22 DIAGNOSIS — Y9389 Activity, other specified: Secondary | ICD-10-CM | POA: Insufficient documentation

## 2014-10-22 DIAGNOSIS — E785 Hyperlipidemia, unspecified: Secondary | ICD-10-CM | POA: Diagnosis not present

## 2014-10-22 DIAGNOSIS — M1990 Unspecified osteoarthritis, unspecified site: Secondary | ICD-10-CM | POA: Insufficient documentation

## 2014-10-22 DIAGNOSIS — J8410 Pulmonary fibrosis, unspecified: Secondary | ICD-10-CM | POA: Diagnosis not present

## 2014-10-22 DIAGNOSIS — W19XXXA Unspecified fall, initial encounter: Secondary | ICD-10-CM

## 2014-10-22 DIAGNOSIS — Y9289 Other specified places as the place of occurrence of the external cause: Secondary | ICD-10-CM | POA: Diagnosis not present

## 2014-10-22 DIAGNOSIS — S29001A Unspecified injury of muscle and tendon of front wall of thorax, initial encounter: Secondary | ICD-10-CM | POA: Diagnosis not present

## 2014-10-22 DIAGNOSIS — I10 Essential (primary) hypertension: Secondary | ICD-10-CM | POA: Insufficient documentation

## 2014-10-22 DIAGNOSIS — J449 Chronic obstructive pulmonary disease, unspecified: Secondary | ICD-10-CM | POA: Insufficient documentation

## 2014-10-22 DIAGNOSIS — Z86010 Personal history of colonic polyps: Secondary | ICD-10-CM | POA: Insufficient documentation

## 2014-10-22 DIAGNOSIS — F419 Anxiety disorder, unspecified: Secondary | ICD-10-CM | POA: Diagnosis not present

## 2014-10-22 DIAGNOSIS — W1839XA Other fall on same level, initial encounter: Secondary | ICD-10-CM | POA: Diagnosis not present

## 2014-10-22 DIAGNOSIS — Z79899 Other long term (current) drug therapy: Secondary | ICD-10-CM | POA: Insufficient documentation

## 2014-10-22 DIAGNOSIS — Z8701 Personal history of pneumonia (recurrent): Secondary | ICD-10-CM | POA: Insufficient documentation

## 2014-10-22 DIAGNOSIS — Z86711 Personal history of pulmonary embolism: Secondary | ICD-10-CM | POA: Insufficient documentation

## 2014-10-22 DIAGNOSIS — Y998 Other external cause status: Secondary | ICD-10-CM | POA: Insufficient documentation

## 2014-10-22 DIAGNOSIS — M797 Fibromyalgia: Secondary | ICD-10-CM | POA: Insufficient documentation

## 2014-10-22 DIAGNOSIS — Z9289 Personal history of other medical treatment: Secondary | ICD-10-CM

## 2014-10-22 DIAGNOSIS — Z853 Personal history of malignant neoplasm of breast: Secondary | ICD-10-CM | POA: Insufficient documentation

## 2014-10-22 DIAGNOSIS — N63 Unspecified lump in breast: Secondary | ICD-10-CM | POA: Insufficient documentation

## 2014-10-22 DIAGNOSIS — R918 Other nonspecific abnormal finding of lung field: Secondary | ICD-10-CM | POA: Diagnosis not present

## 2014-10-22 DIAGNOSIS — Z87891 Personal history of nicotine dependence: Secondary | ICD-10-CM | POA: Diagnosis not present

## 2014-10-22 DIAGNOSIS — R079 Chest pain, unspecified: Secondary | ICD-10-CM

## 2014-10-22 DIAGNOSIS — Z85118 Personal history of other malignant neoplasm of bronchus and lung: Secondary | ICD-10-CM | POA: Diagnosis not present

## 2014-10-22 DIAGNOSIS — N6320 Unspecified lump in the left breast, unspecified quadrant: Secondary | ICD-10-CM

## 2014-10-22 DIAGNOSIS — Z902 Acquired absence of lung [part of]: Secondary | ICD-10-CM

## 2014-10-22 LAB — CBC
HCT: 35 % — ABNORMAL LOW (ref 36.0–46.0)
HEMOGLOBIN: 11.1 g/dL — AB (ref 12.0–15.0)
MCH: 27.1 pg (ref 26.0–34.0)
MCHC: 31.7 g/dL (ref 30.0–36.0)
MCV: 85.4 fL (ref 78.0–100.0)
PLATELETS: 225 10*3/uL (ref 150–400)
RBC: 4.1 MIL/uL (ref 3.87–5.11)
RDW: 13.4 % (ref 11.5–15.5)
WBC: 5.8 10*3/uL (ref 4.0–10.5)

## 2014-10-22 LAB — I-STAT TROPONIN, ED: TROPONIN I, POC: 0.01 ng/mL (ref 0.00–0.08)

## 2014-10-22 LAB — BASIC METABOLIC PANEL
ANION GAP: 11 (ref 5–15)
BUN: 18 mg/dL (ref 6–20)
CALCIUM: 8.9 mg/dL (ref 8.9–10.3)
CO2: 22 mmol/L (ref 22–32)
CREATININE: 0.71 mg/dL (ref 0.44–1.00)
Chloride: 104 mmol/L (ref 101–111)
GLUCOSE: 137 mg/dL — AB (ref 65–99)
Potassium: 3.6 mmol/L (ref 3.5–5.1)
Sodium: 137 mmol/L (ref 135–145)

## 2014-10-22 LAB — HEPATIC FUNCTION PANEL
ALBUMIN: 4.1 g/dL (ref 3.5–5.0)
ALK PHOS: 62 U/L (ref 38–126)
ALT: 12 U/L — ABNORMAL LOW (ref 14–54)
AST: 20 U/L (ref 15–41)
Bilirubin, Direct: <0.1 mg/dL — ABNORMAL LOW (ref 0.1–0.5)
TOTAL PROTEIN: 6.8 g/dL (ref 6.5–8.1)
Total Bilirubin: 0.5 mg/dL (ref 0.3–1.2)

## 2014-10-22 MED ORDER — MORPHINE SULFATE (PF) 4 MG/ML IV SOLN
4.0000 mg | Freq: Once | INTRAVENOUS | Status: AC
  Administered 2014-10-22: 4 mg via INTRAVENOUS
  Filled 2014-10-22: qty 1

## 2014-10-22 NOTE — ED Notes (Signed)
Per ems-- pt initially called out for L groin pain. Once pt was in ems truck- pt reports cp. Pt with hx of PE. Pt ambulated without difficulty. Pt has also had nausea and diarrhea. 12 lead unremarkable. 324 asa, 1 nitro administered pta.

## 2014-10-22 NOTE — ED Provider Notes (Signed)
CSN: [id number]   Arrival date & time 10/22/14 2210  History  This chart was scribed for non-physician practitioner,Rosemae Mcquown, PA-C working with [name], MD by Altamease Oiler, ED Scribe. This patient was seen in room D31C/D31C and the patient's care was started at 1:35 AM.  Chief Complaint  Patient presents with  . Chest Pain  . Groin Pain    HPI The history is provided by the patient and medical records. No language interpreter was used.   Brought in by EMS, Breanna Dixon is a 69 y.o. female with PMHx of HTN, HLD, lupus, COPD, bronchitis,  PE, breast cancer s/p mastectomy, lung cancer s/p lobectomy, and anxiety who presents to the Emergency Department complaining of left upper thigh pain after 2 mechanical falls in the last 3 weeks. The pain has gradually worsened and she has been using a cane to ambulate. Ibuprofen, Tylenol, and Vicodin has provided insufficient pain relief. Externally rotating the hip usually improves the pain.  Tonight while trying to get comfortable with the tight pain in her thigh she had a sudden, severe, and sharp pain in the left side of her groin. Also complains of central chest pain with sudden onset tonight after using the restroom. At initial onset the pain was sharp but at present it feels heavy. When the pain first started she was diaphoretic and nauseous though this has all resolved. Pt denies LE swelling, recent travel, estrogen usage, recent surgeries. Pt reports a hx of PE with IVC filter in place.    Past Medical History  Diagnosis Date  . Seasonal allergies     flonase prn  . Lung mass April 2013    Right lower lobe lung mass  . Arthritis   . Pulmonary embolism 2006    IVC filter  . PTSD (post-traumatic stress disorder)     takes Cymbalta daily  . Anxiety     takes Ativan daily  . Fibromyalgia     takes Lyrica daily  . History of bronchitis   . Hyperlipidemia     takes Fenofibrate daily  . COPD (chronic obstructive pulmonary  disease)   . Headache(784.0)     takes Tegretol and Verapamil nightly  . Hypertension     takes Toprol daily  . Hypertension   . Vertigo     takes Antivert prn  . Lupus     takes Cymbalta,Plaquenil  daily  . Joint pain   . History of ulcer disease     pyloric   . History of colon polyps     benign  . History of blood transfusion     no abnormal reaction noted  . Pneumonia     hx of  . History of kidney stones   . Cancer     breast hx  . Bleeding ulcer 1974    Past Surgical History  Procedure Laterality Date  . Cholecystectomy  1994  . Back surgery  2000/2010    fusion  . Tonsillectomy  1967  . Appendectomy  1965  . Cystoscopy      multiple  . Urethral dilation  1971  . Abdominal hysterectomy  1992  . Dilation and curettage of uterus    . Colonoscopy    . Knee arthroscopy Bilateral   . Total knee arthroplasty  06/04/2011    Procedure: TOTAL KNEE ARTHROPLASTY;  Surgeon: Rudean Haskell, MD;  Location: [facility];  Service: Orthopedics;  Laterality: Right;  . Vena cava filter placement  2006  .  Incisional hernia repair    . Multiple tooth extractions  1990  . Total knee arthroplasty  10/15/2011    Procedure: TOTAL KNEE ARTHROPLASTY;  Surgeon: Rudean Haskell, MD;  Location: [facility];  Service: Orthopedics;  Laterality: Left;  . Ivc filter    . Video bronchoscopy with endobronchial navigation N/A 06/02/2014    Procedure: VIDEO BRONCHOSCOPY WITH ENDOBRONCHIAL NAVIGATION;  Surgeon: Collene Gobble, MD;  Location: [facility];  Service: Thoracic;  Laterality: N/A;  . Breast surgery      bilateral partial masectomy  . Hernia repair    . Joint replacement Bilateral     Knees  . Cardiac catheterization  1992    no PCI  . Thoracotomy/lobectomy Right 07/15/2014    Procedure: RIGHT THORACOTOMY/RIGHT LOWER LOBE  LOBECTOMY;  Surgeon: Gaye Pollack, MD;  Location: [facility];  Service: Thoracic;  Laterality: Right;    Family History  Problem Relation Age of Onset  . Adopted: Yes  . Anesthesia  problems Neg Hx   . Hypotension Neg Hx   . Malignant hyperthermia Neg Hx   . Pseudochol deficiency Neg Hx     Social History  Substance Use Topics  . Smoking status: Former Smoker -- 0.75 packs/day for 7 years    Types: Cigarettes    Quit date: 06/15/2012  . Smokeless tobacco: Never Used     Comment: quit smoking 66yr ago  . Alcohol Use: No     Review of Systems  Constitutional: Positive for diaphoresis. Negative for fever, appetite change, fatigue and unexpected weight change.  HENT: Negative for mouth sores.   Eyes: Negative for visual disturbance.  Respiratory: Negative for cough, chest tightness, shortness of breath and wheezing.   Cardiovascular: Positive for chest pain. Negative for leg swelling.  Gastrointestinal: Positive for nausea. Negative for vomiting, abdominal pain, diarrhea and constipation.  Endocrine: Negative for polydipsia, polyphagia and polyuria.  Genitourinary: Negative for dysuria, urgency, frequency and hematuria.  Musculoskeletal: Positive for arthralgias (left hip). Negative for back pain and neck stiffness.       Left thigh and groin pain  Skin: Negative for rash.  Allergic/Immunologic: Negative for immunocompromised state.  Neurological: Negative for syncope, light-headedness and headaches.       Near syncope  Hematological: Does not bruise/bleed easily.  Psychiatric/Behavioral: Negative for sleep disturbance. The patient is not nervous/anxious.     Home Medications   Prior to Admission medications   Medication Sig Start Date End Date Taking? Authorizing Provider  acetaminophen (TYLENOL) 500 MG tablet Take 500 mg by mouth every 6 (six) hours as needed for headache. Takes two tablets if needed    Historical Provider, MD  carbamazepine (TEGRETOL XR) 200 MG 12 hr tablet Take 200-400 mg by mouth 2 (two) times daily. Takes 1 in the morning and 2 at bedtime    Historical Provider, MD  clotrimazole-betamethasone (LOTRISONE) cream Apply 1 application  topically 2 (two) times daily as needed (skin irritation).     Historical Provider, MD  COMBIVENT RESPIMAT 20-100 MCG/ACT AERS respimat INHALE TWO PUFFS INTO LUNGS  EVERY 6 HOURS AS NEEDED FOR SHORTNESS OF BREATH 08/06/14   CDeneise Lever MD  DULoxetine (CYMBALTA) 60 MG capsule Take 120 mg by mouth daily. Daily at bedtime    Historical Provider, MD  fenofibrate micronized (LOFIBRA) 134 MG capsule Take 134 mg by mouth daily before breakfast.    Historical Provider, MD  furosemide (LASIX) 20 MG tablet Take 1 tablet (20 mg total) by mouth daily.  01/12/12   Oswald Hillock, MD  hydroxychloroquine (PLAQUENIL) 200 MG tablet Take 200 mg by mouth 2 (two) times daily.    Historical Provider, MD  ipratropium-albuterol (DUONEB) 0.5-2.5 (3) MG/3ML SOLN Take 3 mLs by nebulization every 6 (six) hours as needed. 09/08/14   Deneise Lever, MD  LORazepam (ATIVAN) 2 MG tablet Take 2 mg by mouth 3 (three) times daily as needed for anxiety.     Historical Provider, MD  methocarbamol (ROBAXIN) 500 MG tablet Take 1 tablet (500 mg total) by mouth 2 (two) times daily. 09/24/14   Tatyana Kirichenko, PA-C  metoprolol succinate (TOPROL-XL) 100 MG 24 hr tablet Take 1 tablet (100 mg total) by mouth daily. Take with or immediately following a meal. 01/12/12   Oswald Hillock, MD  pregabalin (LYRICA) 75 MG capsule Take 75 mg by mouth 2 (two) times daily.    Historical Provider, MD  verapamil (COVERA HS) 240 MG (CO) 24 hr tablet Take 240 mg by mouth at bedtime.    Historical Provider, MD  Vitamin D, Ergocalciferol, (DRISDOL) 50000 UNITS CAPS capsule Take 50,000 Units by mouth every 7 (seven) days.    Historical Provider, MD    Allergies  Imitrex; Ketorolac tromethamine; Stadol; Ivp dye; and Shellfish allergy  Triage Vitals: BP 120/63 mmHg  Pulse 95  Temp(Src) 98.8 F (37.1 C) (Oral)  Resp 18  SpO2 97%  LMP 06/04/2011  Physical Exam  Constitutional: She appears well-developed and well-nourished. No distress.  Awake, alert,  nontoxic appearance  HENT:  Head: Normocephalic and atraumatic.  Mouth/Throat: Oropharynx is clear and moist. No oropharyngeal exudate.  Eyes: Conjunctivae are normal. No scleral icterus.  Neck: Normal range of motion. Neck supple.  Full ROM without pain  Cardiovascular: Normal rate, regular rhythm, normal heart sounds and intact distal pulses.   Pulmonary/Chest: Effort normal and breath sounds normal. No respiratory distress. She has no wheezes.  Equal chest expansion Clear and equal breath sounds  Abdominal: Soft. Bowel sounds are normal. She exhibits no distension and no mass. There is no tenderness. There is no rebound and no guarding.  Soft and nontender Obese  Musculoskeletal: Normal range of motion. She exhibits tenderness. She exhibits no edema.  Full range of motion of the T-spine and L-spine No tenderness to palpation of the spinous processes of the T-spine or L-spine No tenderness to palpation of the paraspinous muscles of the L-spine Significant tenderness palpation along the psoas muscle of the left hip in the left groin with reproduction of pain FROM of the left hip with pain FROM of the bilateral knees (scars suggest bilateral joint replacement)   Lymphadenopathy:    She has no cervical adenopathy.  Neurological: She is alert. She has normal reflexes.  Reflex Scores:      Bicep reflexes are 2+ on the right side and 2+ on the left side.      Brachioradialis reflexes are 2+ on the right side and 2+ on the left side.      Patellar reflexes are 2+ on the right side and 2+ on the left side.      Achilles reflexes are 2+ on the right side and 2+ on the left side. Speech is clear and goal oriented, follows commands Normal 5/5 strength in upper and lower extremities bilaterally including dorsiflexion and plantar flexion, strong and equal grip strength Sensation normal to light and sharp touch Moves extremities without ataxia, coordination intact Antalgic gait Normal  balance No Clonus   Skin: Skin is  warm and dry. No rash noted. She is not diaphoretic. No erythema.  Psychiatric: She has a normal mood and affect. Her behavior is normal.  Nursing note and vitals reviewed.   ED Course  Procedures  DIAGNOSTIC STUDIES: Oxygen Saturation is 97% on RA,  normal by my interpretation.    COORDINATION OF CARE: 10:53 PM Discussed treatment plan which includes CXR, EKG, lab work, XRs of the left hip and femur, and pain management with pt at bedside and pt agreed to the plan.  Labs Review-  Labs Reviewed  BASIC METABOLIC PANEL - Abnormal; Notable for the following:    Glucose, Bld 137 (*)    All other components within normal limits  CBC - Abnormal; Notable for the following:    Hemoglobin 11.1 (*)    HCT 35.0 (*)    All other components within normal limits  HEPATIC FUNCTION PANEL - Abnormal; Notable for the following:    ALT 12 (*)    Bilirubin, Direct <0.1 (*)    All other components within normal limits  D-DIMER, QUANTITATIVE (NOT AT [facility]) - Abnormal; Notable for the following:    D-Dimer, Quant 0.55 (*)    All other components within normal limits  URINALYSIS, ROUTINE W REFLEX MICROSCOPIC (NOT AT [facility])  Randolm Idol, ED    Imaging Review Dg Chest 2 View  10/22/2014   CLINICAL DATA:  Chest pain, dizziness  EXAM: CHEST  2 VIEW  COMPARISON:  09/24/2014  FINDINGS: Cardiomediastinal silhouette is stable. No acute infiltrate or pulmonary edema. There is stable chronic blunting of the right costophrenic angle. Osteopenia and mild degenerative changes thoracic spine.  IMPRESSION: No active disease. Stable chronic blunting of the right costophrenic angle. Degenerative changes thoracic spine.   Electronically Signed   By: Lahoma Crocker M.D.   On: 10/22/2014 23:27   Dg Pelvis 1-2 Views  10/22/2014   CLINICAL DATA:  Fall 3 weeks prior, with persistent left hip pain.  EXAM: PELVIS - 1-2 VIEW  COMPARISON:  09/24/2014  FINDINGS: The cortical margins of the bony  pelvis are intact. No fracture. Pubic symphysis and sacroiliac joints are congruent. Both femoral heads are well-seated in the respective acetabula. There is joint space narrowing of the left hip, unchanged. Postsurgical change in the lower lumbar spine, partially included. Small sclerotic density in the left pubic rami are unchanged, likely bone island.  IMPRESSION: Degenerative change of the left hip without acute bony abnormality.   Electronically Signed   By: Jeb Levering M.D.   On: 10/22/2014 23:28   Dg Femur Min 2 Views Left  10/22/2014   CLINICAL DATA:  Fall 3 weeks ago, left femur pain  EXAM: LEFT FEMUR 2 VIEWS  COMPARISON:  None.  FINDINGS: Four views of the left femur submitted. No acute fracture or subluxation. Partially visualized left knee prosthesis.  IMPRESSION: Negative.   Electronically Signed   By: Lahoma Crocker M.D.   On: 10/22/2014 23:27    EKG Interpretation  Date/Time:  Friday October 22 2014 22:39:46 EDT Ventricular Rate:  91 PR Interval:  151 QRS Duration: 84 QT Interval:  383 QTC Calculation: 471 R Axis:   57 Text Interpretation:  Sinus rhythm Borderline repolarization abnormality Confirmed by ZAVITZ  MD, JOSHUA ([id number]) on 10/22/2014 11:21:08 PM     MDM   Final diagnoses:  Fall  Chest pain, unspecified chest pain type  S/P lobectomy of lung-carcinoid 2016  Granulomatous lung disease  Breast mass, left UOQ  Essential hypertension   Rosann Auerbach  A Dixon presents with left hip and thigh pain for the last several days after a few falls.  Pt also with recent surgery for metastatic breast cancer.  Hx of PE, with IVC filter in place.  No swelling of the leg, calf tenderness or palpable cord, but with chest pain will need ACS and PE rule out.  Pt without cardiac Hx.    The patient was discussed with and seen by Dr. Reather Converse who agrees with the treatment plan.  Pt is a moderate risk with a HEART score of 4.  She is chest pain free at this time.  X-rays are without mild  degenerative changes in the hip but no other acute abnormalities.    1:10 AM Case discussed with and care transferred to Dr. Randal Buba.   Plan:  Pt pending d-dimer and delta troponin.  If both are negative she may be d/c home.  Pt with contrast allergy.  If her d-dimer is positive she will likely need a VQ study.       I personally performed the services described in this documentation, which was scribed in my presence. The recorded information has been reviewed and is accurate.   Jarrett Soho Melitta Tigue, PA-C 10/23/14 0136  [name], MD 10/23/14 [phone]

## 2014-10-22 NOTE — ED Notes (Signed)
Pt reports that she was going to use the bathroom and became diaphoretic and dizzy then began to have cp. Pt also reports she had an acute onset of L groin pain that started tonight. She fell a couple weeks ago and c/o continued pain to L upper thigh as well. Reports now only slight chest heaviness and 8/10 groin pain. Denies urinary issues with pain (hx kidney stones).

## 2014-10-23 ENCOUNTER — Emergency Department (HOSPITAL_COMMUNITY): Payer: Medicare Other

## 2014-10-23 DIAGNOSIS — S29001A Unspecified injury of muscle and tendon of front wall of thorax, initial encounter: Secondary | ICD-10-CM | POA: Diagnosis not present

## 2014-10-23 LAB — I-STAT TROPONIN, ED: TROPONIN I, POC: 0 ng/mL (ref 0.00–0.08)

## 2014-10-23 LAB — URINALYSIS, ROUTINE W REFLEX MICROSCOPIC
BILIRUBIN URINE: NEGATIVE
GLUCOSE, UA: NEGATIVE mg/dL
Hgb urine dipstick: NEGATIVE
KETONES UR: NEGATIVE mg/dL
Leukocytes, UA: NEGATIVE
NITRITE: NEGATIVE
PH: 5.5 (ref 5.0–8.0)
PROTEIN: NEGATIVE mg/dL
Specific Gravity, Urine: 1.029 (ref 1.005–1.030)
Urobilinogen, UA: 0.2 mg/dL (ref 0.0–1.0)

## 2014-10-23 LAB — D-DIMER, QUANTITATIVE: D-Dimer, Quant: 0.55 ug/mL-FEU — ABNORMAL HIGH (ref 0.00–0.48)

## 2014-10-23 MED ORDER — TECHNETIUM TO 99M ALBUMIN AGGREGATED
5.7000 | Freq: Once | INTRAVENOUS | Status: AC | PRN
  Administered 2014-10-23: 6 via INTRAVENOUS

## 2014-10-23 MED ORDER — OXYCODONE HCL 5 MG PO TABS: 5.0000 mg | ORAL_TABLET | ORAL | Status: DC | PRN

## 2014-10-23 MED ORDER — XARELTO VTE STARTER PACK 15 & 20 MG PO TBPK: 15.0000 mg | ORAL_TABLET | ORAL | Status: DC

## 2014-10-23 MED ORDER — OXYCODONE-ACETAMINOPHEN 5-325 MG PO TABS
1.0000 | ORAL_TABLET | Freq: Once | ORAL | Status: AC
  Administered 2014-10-23: 1 via ORAL
  Filled 2014-10-23: qty 1

## 2014-10-23 MED ORDER — TECHNETIUM TC 99M DIETHYLENETRIAME-PENTAACETIC ACID: 39.4000 | Freq: Once | INTRAVENOUS | Status: DC | PRN

## 2014-10-23 MED ORDER — DIAZEPAM 2 MG PO TABS
2.0000 mg | ORAL_TABLET | Freq: Once | ORAL | Status: AC
  Administered 2014-10-23: 2 mg via ORAL
  Filled 2014-10-23: qty 1

## 2014-10-23 MED ORDER — OXYCODONE HCL 5 MG PO TABS
5.0000 mg | ORAL_TABLET | Freq: Once | ORAL | Status: AC
  Administered 2014-10-23: 5 mg via ORAL
  Filled 2014-10-23: qty 1

## 2014-10-23 MED ORDER — DIAZEPAM 5 MG PO TABS: 2.5000 mg | ORAL_TABLET | Freq: Two times a day (BID) | ORAL | Status: DC

## 2014-10-23 NOTE — ED Notes (Signed)
MD at bedside. 

## 2014-10-23 NOTE — ED Provider Notes (Signed)
Received patient care in turnover by Dr. Randal Buba. Patient with left groin pain after getting her leg stuck on the sheets when trying to move in bed. Pain is worse with movement palpation. Patient also recently had a lobectomy having some postsurgical difficulties with this. Patient has a remote history of PE not on anticoagulation but has a IVC filter. Patient had a d-dimer performed that could be age-adjusted and reviewed as negative however patient obtained a VQ scan due to risk factors. Patient with significant underlying lung disease, scan was read as indeterminate. Due to this with recent surgery and concern for possible PE will start on xarelto. Discussed with patient the risks of anticoagulation. Patient seen at a pain clinic feels okay for her to get extra narcotics for her pain. Patient well-appearing not hypoxic and no pain on my exam.  9:04 AM:  I have discussed the diagnosis/risks/treatment options with the patient and believe the pt to be eligible for discharge home to follow-up with PCP/neuro. Discussed returning to the ED immediately if new or worsening sx occur. We discussed the sx which are most concerning (e.g., sudden worsening pain, unilateral leg swelling) that necessitate immediate return. Medications administered to the patient during their visit and any new prescriptions provided to the patient are listed below.  Medications given during this visit Medications  technetium TC 48M diethylenetriame-pentaacetic acid (DTPA) injection 39.4 milli Curie (not administered)  morphine 4 MG/ML injection 4 mg (4 mg Intravenous Given 10/22/14 2337)  oxyCODONE-acetaminophen (PERCOCET/ROXICET) 5-325 MG per tablet 1 tablet (1 tablet Oral Given 10/23/14 0425)  technetium albumin aggregated (MAA) injection solution 6 milli Curie (6 milli Curies Intravenous Contrast Given 10/23/14 0720)    New Prescriptions   DIAZEPAM (VALIUM) 5 MG TABLET    Take 0.5 tablets (2.5 mg total) by mouth 2 (two) times daily.    OXYCODONE (ROXICODONE) 5 MG IMMEDIATE RELEASE TABLET    Take 1 tablet (5 mg total) by mouth every 4 (four) hours as needed for severe pain.   XARELTO STARTER PACK 15 & 20 MG TBPK    Take 15-20 mg by mouth as directed. Take as directed on package: Start with one '15mg'$  tablet by mouth twice a day with food. On Day 22, switch to one '20mg'$  tablet once a day with food.     The patient appears reasonably screen and/or stabilized for discharge and I doubt any other medical condition or other [facility] requiring further screening, evaluation, or treatment in the ED at this time prior to discharge.    Deno Etienne, DO 10/23/14 [phone]

## 2014-10-23 NOTE — Discharge Instructions (Signed)
Your VQ scan was indeterminate, we will start you on anticoagulation. xarelto has a reaction with carbamazepine. Please call your neurologist and discuss alternative treatments for your migraines.  Chest Pain (Nonspecific) It is often hard to give a specific diagnosis for the cause of chest pain. There is always a chance that your pain could be related to something serious, such as a heart attack or a blood clot in the lungs. You need to follow up with your health care provider for further evaluation. CAUSES   Heartburn.  Pneumonia or bronchitis.  Anxiety or stress.  Inflammation around your heart (pericarditis) or lung (pleuritis or pleurisy).  A blood clot in the lung.  A collapsed lung (pneumothorax). It can develop suddenly on its own (spontaneous pneumothorax) or from trauma to the chest.  Shingles infection (herpes zoster virus). The chest wall is composed of bones, muscles, and cartilage. Any of these can be the source of the pain.  The bones can be bruised by injury.  The muscles or cartilage can be strained by coughing or overwork.  The cartilage can be affected by inflammation and become sore (costochondritis). DIAGNOSIS  Lab tests or other studies may be needed to find the cause of your pain. Your health care provider may have you take a test called an ambulatory electrocardiogram (ECG). An ECG records your heartbeat patterns over a 24-hour period. You may also have other tests, such as:  Transthoracic echocardiogram (TTE). During echocardiography, sound waves are used to evaluate how blood flows through your heart.  Transesophageal echocardiogram (TEE).  Cardiac monitoring. This allows your health care provider to monitor your heart rate and rhythm in real time.  Holter monitor. This is a portable device that records your heartbeat and can help diagnose heart arrhythmias. It allows your health care provider to track your heart activity for several days, if  needed.  Stress tests by exercise or by giving medicine that makes the heart beat faster. TREATMENT   Treatment depends on what may be causing your chest pain. Treatment may include:  Acid blockers for heartburn.  Anti-inflammatory medicine.  Pain medicine for inflammatory conditions.  Antibiotics if an infection is present.  You may be advised to change lifestyle habits. This includes stopping smoking and avoiding alcohol, caffeine, and chocolate.  You may be advised to keep your head raised (elevated) when sleeping. This reduces the chance of acid going backward from your stomach into your esophagus. Most of the time, nonspecific chest pain will improve within 2-3 days with rest and mild pain medicine.  HOME CARE INSTRUCTIONS   If antibiotics were prescribed, take them as directed. Finish them even if you start to feel better.  For the next few days, avoid physical activities that bring on chest pain. Continue physical activities as directed.  Do not use any tobacco products, including cigarettes, chewing tobacco, or electronic cigarettes.  Avoid drinking alcohol.  Only take medicine as directed by your health care provider.  Follow your health care provider's suggestions for further testing if your chest pain does not go away.  Keep any follow-up appointments you made. If you do not go to an appointment, you could develop lasting (chronic) problems with pain. If there is any problem keeping an appointment, call to reschedule. SEEK MEDICAL CARE IF:   Your chest pain does not go away, even after treatment.  You have a rash with blisters on your chest.  You have a fever. SEEK IMMEDIATE MEDICAL CARE IF:   You have increased  chest pain or pain that spreads to your arm, neck, jaw, back, or abdomen.  You have shortness of breath.  You have an increasing cough, or you cough up blood.  You have severe back or abdominal pain.  You feel nauseous or vomit.  You have severe  weakness.  You faint.  You have chills. This is an emergency. Do not wait to see if the pain will go away. Get medical help at once. Call your local emergency services (911 in [place] Do not drive yourself to the hospital. MAKE SURE YOU:   Understand these instructions.  Will watch your condition.  Will get help right away if you are not doing well or get worse. Document Released: 11/01/2004 Document Revised: 01/27/2013 Document Reviewed: 08/28/2007 [facility] Patient Information 2015 [place], [place]. This information is not intended to replace advice given to you by your health care provider. Make sure you discuss any questions you have with your health care provider.

## 2014-10-23 NOTE — ED Notes (Signed)
Contacted lab for add on d-dimer

## 2014-10-23 NOTE — ED Notes (Signed)
Pt requesting pain medication; Dr.Palumbo made aware; pt given water

## 2014-11-04 ENCOUNTER — Encounter: Payer: Self-pay | Admitting: Internal Medicine

## 2014-11-04 ENCOUNTER — Ambulatory Visit (INDEPENDENT_AMBULATORY_CARE_PROVIDER_SITE_OTHER): Payer: Medicare Other | Admitting: Internal Medicine

## 2014-11-04 VITALS — BP 112/60 | HR 74 | Ht 66.0 in | Wt 231.4 lb

## 2014-11-04 DIAGNOSIS — Z720 Tobacco use: Secondary | ICD-10-CM

## 2014-11-04 DIAGNOSIS — R0789 Other chest pain: Secondary | ICD-10-CM

## 2014-11-04 DIAGNOSIS — J449 Chronic obstructive pulmonary disease, unspecified: Secondary | ICD-10-CM | POA: Diagnosis not present

## 2014-11-04 DIAGNOSIS — Z23 Encounter for immunization: Secondary | ICD-10-CM

## 2014-11-04 NOTE — Assessment & Plan Note (Signed)
Indicates she is still not smoking

## 2014-11-04 NOTE — Assessment & Plan Note (Signed)
Pain consistent with musculoskeletal chest wall. Could be cracked rib, torn adhesion from old surgery.  Do not suspect VTE infarction pain. If it doesn't respond to heat and iburofen, consider nuc med bone scan or rib detail xray. Flu vax

## 2014-11-04 NOTE — Progress Notes (Signed)
01/15/12- 25 yoF heavy smoker recently hosp for exacerbation of COPD and now referred for pulmonary management. Hospitalized 11/29 through 01/12/2012 with discharge diagnoses of acute respiratory failure with hypoxia, COPD, tobacco abuse.. She had moved to this area from the [place] in 2009. Since here she has had more bronchitis with season change. Recent hospital for typical exacerbation. Now in physical therapy for rehabilitation after bilateral total knee replacements (05/15/2011, 10/14/2011). She has smoked one pack per day since 1967 and states that she stopped smoking November 28, using patches. Breathing is better now than usual from prehospital baseline. She never has had a cough. Activity has been limited by her knees but she thinks she has less dyspnea on exertion now. She is not needing her Combivent but continues Spiriva. A right lower lobe nodule was found April 2013. CT chest 12/21/2011 described as stable well-circumscribed right lower lobe nodule and recommended PET. Noted diffuse old granulomatous disease with calcification including calcified mediastinal and hilar nodes, granulomas in spleen and calcified nodes in the upper abdomen. No history of TB exposure. Pulmonary embolism 2006/IVC filter. Negative VQ scan 10/21/2011. She reports having a PET scan through [facility] in [place] this summer. We need to verify that and get that report.  Treated for hypertension but denies heart disease. 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 Adopted. CT 12/21/11-reviewed IMPRESSION:  1. Stable well circumscribed nodule within the right lower lobe.  Consider PET CT for metabolic activity if indicated clinically.  2. Diffuse changes of prior granulomatous disease with calcified  left lower lobe granulomas, mediastinal and hilar calcified nodes,  calcified splenic granuloma, and calcified nodes in the upper  abdomen.  Original Report Authenticated By: Ivar Drape,  M.D.   05/16/12- 35 yoF former heavy smoker followed for  COPD, lung nodule RLL  FOLLOWS FOR: pt states breathing is doing good overall(used Rescue inhaler once since hospital stay); review PFT and mammogram results with patient. States she quit smoking cigarettes in November, 2013. Had CT chest 12/21/2011 and PET scan at [facility],  her pulmonologist at [facility] retired. Now denies shortness of breath, cough, phlegm wheeze. Using Spiriva daily. History of bilateral lumpectomy. Mammograms were negative 01/23/2012.  CXR 01/04/12 IMPRESSION:  Coarse lung markings most likely represent chronic changes but  difficult to exclude mild edema. There is no focal airspace  disease.  Nodule in the right lower lung. This was better characterized on  the chest CT from 12/21/2011.  Original Report Authenticated By: Markus Daft, M.D. PFT 05/16/2012: Mild obstructive airways disease with insignificant response to bronchodilator, normal lung volumes, diffusion mildly reduced. FVC 3.08/96%, FEV1 2.01/86%, FEV1/FVC 0.65. TLC 95%, DLCO 73%.  07/07/12- 18 yoF former heavy smoker followed for  COPD, lung nodule RLL , Hx PE/ IVC filter FOLLOWS FOR: Denies any SOB, wheezing, cough, or congestion; overall breathing is doing good per patient. Tells me in past year she has had TKR both knees and lumbar spine sgy. She gives additional medical hx of PE x2 w/ IVC filter 2006. 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 Fibromyalgia. 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 Says breathing "fine". Discussed f/u nodules.  01/06/13- 34 yoF former heavy smoker followed for  COPD, lung nodule RLL , Hx PE/ IVC filter FOLLOWS FOR:at primary md 1 wk. ago had rales,sob-worse,chest pain and tightness,cough-yellow x 2 wks.,wheezing ,fcs  Says she felt this way last year in  the fall. Recent chest congestion, cough with clear or trace yellow, tight,  back pain off and on, some low-grade fever and sweats. Using rescue inhaler for the first time in a year. Not smoking. CT chest 08/13/12 IMPRESSION:  Stable 1.9 cm smoothly marginated ovoid lesion in the right lower  lobe. IV contrast was administered today and this does not appear  to represent an AV malformation. The nodule also shows no evidence (IR did not do needle bx.) of contrast enhancement. Given all of these factors as well as the  presence of multiple abutting blood vessels which may make the  biopsy of higher risk, decision was made to not perform a  percutaneous biopsy today and follow the lesion by imaging.  Findings were discussed directly with Dr. Annamaria Boots.  Original Report Authenticated By: Aletta Edouard, M.D. PET elsewhere 06/29/11- IVC filter, old granulomatous disease. RLL nodule 18/16 mm, SUV 1.4 PET 07/16/12 IMPRESSION:  1.9 x 1.8 cm right lower lobe pulmonary nodule, with suspected slow  interval growth and associated vague hypermetabolism, max SUV 2.5.  While indeterminate, the overall appearance is worrisome for  primary bronchogenic neoplasm. Consider bronchoscopy or  percutaneous biopsy for further evaluation.  No evidence of metastatic disease.  Original Report Authenticated By: Julian Hy, M.D  07/14/13- 21 yoF former heavy smoker followed for  COPD, lung nodule RLL , Hx PE/ IVC filter, hx discoid lupus rash FOLLOWS FOR:No trouble with breathing as long as she is not in pain. Per note last visit, IR did not bx nodule. Moved to old home with many steps. Using Combivent less than once daily while continuing Spiriva. Discoid lupus rash is worse in heat CT 07/14/13 IMPRESSION:  Old granulomatous disease.  Stable 3 mm RIGHT upper lobe noncalcified pulmonary nodule.  Stable lobulated RIGHT lower lobe mass ; due to growth on prior exam  and hypermetabolism on PET-CT, recommend continued surveillance to  exclude malignancy.  No new intrathoracic abnormalities.   Electronically Signed  By: Lavonia Dana M.D.  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 Tried Spiriva but couldn't manage the dispenser well. She relates dyspnea on exertion to obesity and deconditioning, noticed mainly on stairs.  06/08/14- 68 yoF former heavy smoker (quit 3-4 yrs ago), former scrub nurse followed for  COPD, lung nodule RLL/ Carcinoid, 30m RUL nodule , Hx PE/ IVC filter, hx discoid lupus rash, hx bilateral breast Ca lumpectomies Guided bronch/ Dr BLamonte Sakai+ carcinoid RLL.  Friend and son here, daughter was on speaker phone. Since bronch she has had focal R chest pain in mid clavicular line, tender to pressure but not pleuritic. Cough w scant clear mucus and hoarseness from  Bronch are clearing. Had some loose stools ? Dark. CT chest 05/05/14 IMPRESSION: Continued interval growth of the right lower lobe pulmonary nodule, now measuring 2.6 cm in diameter. Neoplasm remains a concern. Otherwise stable exam. Electronically Signed  By: EMisty StanleyM.D.  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 Pt has fallen twice since surgery-unsure why. Most recent event was 3 days ago.   Son here Uses cane after bilat TKR. Hooked toe walking and tripped. Still having R post thoracotomy pain, slowly easing. PFT 06/14/14- Minimal obstructive disease, insig response to BD, Minimal  Diffusion defect. FVC 2.90/ 83%, 2.16/ 81%, FEV1/FVC 0.75, TLC 95%, DLCO 72%. Occasional wheeze. Discussed exercise. CT chest 09/04/14 IMPRESSION: 1. Negative for pulmonary embolus 2. Right lower lobectomy with treatment related changes 3. Left thyroid 3 cm mass or cyst. Recommend thyroid sonography for Characterization.-------> Her PCP to manage this Electronically Signed  By: Andreas Newport M.D.  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 states breathing hasn't improved. combivent, duoneb doesn't seem to be working. SOB wrosen x1 wk. wheezing. soreness of RL side pain most of the time sometimes sharpe when deep breathe. dry cough and chills. at [facility] two weeks ago with possible PE.   Son here. Finances very tight. Concerned cost of ibuprofen, heating pad. Pain gradual onset x 2 weeks, R mid ax line, lower rib, point tender, increased by cough or twist. Taking Vicodin and Ativan from pain clinic. Leg dopplers neg 8/19 D-dimer 0.55 on 9/17 VQ scan 10/23/14 IMPRESSION: Intermediate probability for pulmonary embolism. Electronically Signed   By: RSkipper ClicheM.D.  On: 10/23/2014 08:18 CXR 10/22/14 IMPRESSION: No active disease. Stable chronic blunting of the right costophrenic angle. Degenerative changes thoracic spine. Electronically Signed  By: LLahoma CrockerM.D.  On: 10/22/2014 23:27  ROS-see HPI Constitutional:   No-   weight loss, night sweats, +fevers, chills, fatigue, lassitude. HEENT:   No-  headaches, difficulty swallowing, tooth/dental problems, sore throat,       No-  sneezing, itching, ear ache, nasal congestion, post nasal drip,  CV:  +chest pain, orthopnea, PND, swelling in lower extremities, anasarca,  dizziness,  palpitations Resp: +shortness of breath with exertion or at rest.              +cough,  No non-productive cough,  No- coughing up of blood.              No-   change in color of mucus.  + wheezing.   Skin: + rash or lesions. GI:  No-   heartburn, indigestion, abdominal pain, nausea, vomiting,  GU: . MS:  No-   joint pain or swelling. Neuro-     nothing unusual Psych:  No- change in mood or affect. No depression or anxiety.  No memory loss.  OBJ- Physical Exam General- Alert, Oriented, Affect-appropriate, Distress- none acute, +obese Skin- + rash dorsum left hand reported discoid lupus Lymphadenopathy- none Head- atraumatic            Eyes- Gross vision intact, PERRLA, conjunctivae and secretions clear            Ears- Hearing, canals-normal            Nose- Clear, no-Septal dev, mucus, polyps, erosion, perforation             Throat- Mallampati II , mucosa clear , drainage- none, tonsils- atrophic. Dentures Neck- flexible , trachea midline, no stridor , thyroid nl, carotid no bruit Chest - symmetrical excursion ,  unlabored           Heart/CV- RRR , no murmur , no gallop  , no rub, nl s1 s2                           - JVD- none , edema- none, stasis changes- none, varices- none           Lung- clear to P&A, wheeze- none, cough- none , dullness-none, rub- none           Chest wall- + jump tender to finger pressure R low ant rib in MCL. No click or rub. Abd-  Br/ Gen/ Rectal- Not done, not indicated Extrem- cyanosis- none, clubbing, none, atrophy- none, strength- nl.               + bilateral TKR scars, Cane Neuro- grossly intact to observation

## 2014-11-04 NOTE — Patient Instructions (Addendum)
Suggest ibuprofen and heating pad or deep heat muscle rub over the sore rib area  If you aren't much better in a week then we will want to look at getting a nuclear medicine bone scan done  Flu vax  Pneumovax   Add Brovana neb solution as discussed- 1 neb twice daily.  Try this instead of albuterol neb solution. Script written to get this through [facility].

## 2014-11-08 ENCOUNTER — Telehealth: Payer: Self-pay | Admitting: Internal Medicine

## 2014-11-08 MED ORDER — ARFORMOTEROL TARTRATE 15 MCG/2ML IN NEBU: 15.0000 ug | INHALATION_SOLUTION | Freq: Two times a day (BID) | RESPIRATORY_TRACT | Status: DC

## 2014-11-08 MED ORDER — IPRATROPIUM-ALBUTEROL 20-100 MCG/ACT IN AERS: INHALATION_SPRAY | RESPIRATORY_TRACT | Status: DC

## 2014-11-08 NOTE — Telephone Encounter (Signed)
Patient returned call, can be reached at [phone].

## 2014-11-08 NOTE — Telephone Encounter (Signed)
also  Has a not in her back wants an appt for this

## 2014-11-08 NOTE — Telephone Encounter (Signed)
Left message for pt to call back  °

## 2014-11-08 NOTE — Telephone Encounter (Signed)
Called and spoke to pt. Pt is requesting a refill of combivent, rx filled. Pt also stated the neb treatment she received at the last OV in 10/2014 she would like this ordered for her. Pt stated she was informed if this neb treatment worked well for her then we could order it for her. I do see in pt's chart where she received a neb treatment. Pt also c/o a sore knot on her left erector spinae, informed her to speak with her PCP about his as it sounds muscular, pt agreed and states she has a PCP appt on 10.13.16 and will address it then with her PCP.   Dr. Annamaria Boots please advise on neb med. Thanks.   Allergies  Allergen Reactions  . Imitrex [Sumatriptan Base] Nausea And Vomiting    Rapid heart beat.  [name] Ketorolac Tromethamine Nausea And Vomiting    Rapid heart beat.  . Stadol [Butorphanol Tartrate] Nausea And Vomiting  . Ivp Dye [Iodinated Diagnostic Agents] Nausea Only and Palpitations    Can tolerate iodine  . Shellfish Allergy Nausea Only and Palpitations    Current Outpatient Prescriptions on File Prior to Visit  Medication Sig Dispense Refill  . clotrimazole-betamethasone (LOTRISONE) cream Apply 1 application topically 2 (two) times daily as needed (skin irritation).     . DULoxetine (CYMBALTA) 60 MG capsule Take 120 mg by mouth daily. Daily at bedtime    . fenofibrate micronized (LOFIBRA) 134 MG capsule Take 134 mg by mouth daily before breakfast.    . furosemide (LASIX) 20 MG tablet Take 1 tablet (20 mg total) by mouth daily. 30 tablet 2  . Hydrocodone-Acetaminophen (VICODIN ES) 7.5-300 MG TABS Take 7.5 mg by mouth. Takes 1 three times daily by mouth.    . hydroxychloroquine (PLAQUENIL) 200 MG tablet Take 200 mg by mouth 2 (two) times daily.    . hydrOXYzine (ATARAX/VISTARIL) 25 MG tablet Take 25 mg by mouth 3 (three) times daily as needed for itching.    [name] ipratropium-albuterol (DUONEB) 0.5-2.5 (3) MG/3ML SOLN Take 3 mLs by nebulization every 6 (six) hours as needed. 75 mL 11  . LORazepam  (ATIVAN) 2 MG tablet Take 2 mg by mouth 3 (three) times daily as needed for anxiety.     . metoprolol succinate (TOPROL-XL) 100 MG 24 hr tablet Take 1 tablet (100 mg total) by mouth daily. Take with or immediately following a meal. 30 tablet 1  . pregabalin (LYRICA) 75 MG capsule Take 75 mg by mouth 2 (two) times daily.    . verapamil (COVERA HS) 240 MG (CO) 24 hr tablet Take 240 mg by mouth at bedtime.    . Vitamin D, Ergocalciferol, (DRISDOL) 50000 UNITS CAPS capsule Take 50,000 Units by mouth every 7 (seven) days.    Alveda Reasons STARTER PACK 15 & 20 MG TBPK Take 15-20 mg by mouth as directed. Take as directed on package: Start with one '15mg'$  tablet by mouth twice a day with food. On Day 22, switch to one '20mg'$  tablet once a day with food. (Patient not taking: Reported on 11/04/2014) 51 each 0   No current facility-administered medications on file prior to visit.

## 2014-11-08 NOTE — Telephone Encounter (Signed)
Ok to refill Combivent inhaler, 1 puff 4 times daily as needed, refill prn  Ok to Rx Brovana neb solution through her DME company    # 60 ampules,  1 neb twice daily as needed, refill prn

## 2014-11-08 NOTE — Telephone Encounter (Signed)
Spoke with pt, aware of refills.   combivent sent to local pharmacy. brovana printed and placed on CY's cart to sign.    Forwarding to Katie to follow up on.

## 2014-11-09 ENCOUNTER — Telehealth: Payer: Self-pay | Admitting: Internal Medicine

## 2014-11-09 NOTE — Telephone Encounter (Signed)
Spoke with Jeani Hawking from [facility]. I have faxed last note over to her. Nothing further needed

## 2014-11-09 NOTE — Telephone Encounter (Signed)
ATC Lynn. She was on the phone with a patient. WCB in AM

## 2014-11-09 NOTE — Telephone Encounter (Signed)
Rx has been given to [facility] to give to [facility].

## 2014-11-10 NOTE — Telephone Encounter (Signed)
I called spoke with Jeani Hawking from [facility]. She reports pt OV note does not mention brovana and needs this placed into the note. Once done this needs to be faxed to [facility]. Please advise thanks

## 2014-11-10 NOTE — Addendum Note (Signed)
Addended by: Baird Lyons D on: 11/10/2014 04:31 PM   Modules accepted: Miquel Dunn

## 2014-11-10 NOTE — Assessment & Plan Note (Signed)
Sustained exacerbation. We would like to try longer acting neb solution such as Brovana for more stable, sustained control through the day. Success could reduce the expense of multiple metered inhalers. Plan- Try using Brovana neb solution twice daily, taking advantage of longer half-life. Bronchodilator.

## 2014-11-10 NOTE — Telephone Encounter (Signed)
Called and spoke with Breanna Dixon from [place] and informed that note had been changed to reflect the need for Breanna Dixon requested note be faxed to [phone] Note faxed  Nothing further is needed

## 2014-11-10 NOTE — Telephone Encounter (Signed)
Note has been adended as requested to explain need for [employer]

## 2014-11-12 ENCOUNTER — Other Ambulatory Visit: Payer: Self-pay | Admitting: Cardiothoracic Surgery

## 2014-11-12 ENCOUNTER — Other Ambulatory Visit: Payer: Self-pay | Admitting: Surgery

## 2014-11-12 DIAGNOSIS — J984 Other disorders of lung: Secondary | ICD-10-CM

## 2014-11-17 ENCOUNTER — Ambulatory Visit: Payer: Medicare Other | Admitting: Surgery

## 2015-01-06 ENCOUNTER — Telehealth: Payer: Self-pay | Admitting: Internal Medicine

## 2015-01-06 MED ORDER — PREDNISONE 10 MG PO TABS: ORAL_TABLET | ORAL | Status: DC

## 2015-01-06 NOTE — Telephone Encounter (Signed)
Spoke with Breanna Dixon. She reports when she was seen back in 11/01/14 she was given a neb TX (do not see this in chart). She reports she has been using the brovana BID. She reports she does not feel this is helping at all. She is feeling really SOB/wheezing She is also using her combivent 2-3 times a day, she does not have any albuterol nebs. Breanna Dixon seemed confused about her nebulizer medications. Breanna Dixon wants an appt to see Dr. Annamaria Boots Please advise thanks  Allergies  Allergen Reactions  . Imitrex [Sumatriptan Base] Nausea And Vomiting    Rapid heart beat.  [name] Ketorolac Tromethamine Nausea And Vomiting    Rapid heart beat.  . Stadol [Butorphanol Tartrate] Nausea And Vomiting  . Ivp Dye [Iodinated Diagnostic Agents] Nausea Only and Palpitations    Can tolerate iodine  . Shellfish Allergy Nausea Only and Palpitations     Current Outpatient Prescriptions on File Prior to Visit  Medication Sig Dispense Refill  . arformoterol (BROVANA) 15 MCG/2ML NEBU Take 2 mLs (15 mcg total) by nebulization 2 (two) times daily. 60 mL prn  . clotrimazole-betamethasone (LOTRISONE) cream Apply 1 application topically 2 (two) times daily as needed (skin irritation).     . DULoxetine (CYMBALTA) 60 MG capsule Take 120 mg by mouth daily. Daily at bedtime    . fenofibrate micronized (LOFIBRA) 134 MG capsule Take 134 mg by mouth daily before breakfast.    . furosemide (LASIX) 20 MG tablet Take 1 tablet (20 mg total) by mouth daily. 30 tablet 2  . Hydrocodone-Acetaminophen (VICODIN ES) 7.5-300 MG TABS Take 7.5 mg by mouth. Takes 1 three times daily by mouth.    . hydroxychloroquine (PLAQUENIL) 200 MG tablet Take 200 mg by mouth 2 (two) times daily.    . hydrOXYzine (ATARAX/VISTARIL) 25 MG tablet Take 25 mg by mouth 3 (three) times daily as needed for itching.    . Ipratropium-Albuterol (COMBIVENT RESPIMAT) 20-100 MCG/ACT AERS respimat INHALE TWO PUFFS INTO LUNGS  EVERY 6 HOURS AS NEEDED FOR SHORTNESS OF BREATH 1 Inhaler 5  .  ipratropium-albuterol (DUONEB) 0.5-2.5 (3) MG/3ML SOLN Take 3 mLs by nebulization every 6 (six) hours as needed. 75 mL 11  . LORazepam (ATIVAN) 2 MG tablet Take 2 mg by mouth 3 (three) times daily as needed for anxiety.     . metoprolol succinate (TOPROL-XL) 100 MG 24 hr tablet Take 1 tablet (100 mg total) by mouth daily. Take with or immediately following a meal. 30 tablet 1  . pregabalin (LYRICA) 75 MG capsule Take 75 mg by mouth 2 (two) times daily.    . verapamil (COVERA HS) 240 MG (CO) 24 hr tablet Take 240 mg by mouth at bedtime.    . Vitamin D, Ergocalciferol, (DRISDOL) 50000 UNITS CAPS capsule Take 50,000 Units by mouth every 7 (seven) days.    Alveda Reasons STARTER PACK 15 & 20 MG TBPK Take 15-20 mg by mouth as directed. Take as directed on package: Start with one '15mg'$  tablet by mouth twice a day with food. On Day 22, switch to one '20mg'$  tablet once a day with food. (Patient not taking: Reported on 11/04/2014) 51 each 0   No current facility-administered medications on file prior to visit.

## 2015-01-06 NOTE — Telephone Encounter (Signed)
Offer prednisone 10 mg, # 20, 4 X 2 DAYS, 3 X 2 DAYS, 2 X 2 DAYS, 1 X 2 DAYS  She can call again next week to see about work in if still needed then

## 2015-01-06 NOTE — Telephone Encounter (Signed)
Order sent to pharmacy Called and spoke with pt and informed of CY rec Pt voiced understanding  Nothing further is needed

## 2015-04-06 ENCOUNTER — Ambulatory Visit (INDEPENDENT_AMBULATORY_CARE_PROVIDER_SITE_OTHER): Payer: Medicare Other | Admitting: Internal Medicine

## 2015-04-06 ENCOUNTER — Encounter: Payer: Self-pay | Admitting: Internal Medicine

## 2015-04-06 VITALS — BP 120/58 | HR 76 | Ht 66.0 in | Wt 244.4 lb

## 2015-04-06 DIAGNOSIS — J449 Chronic obstructive pulmonary disease, unspecified: Secondary | ICD-10-CM | POA: Diagnosis not present

## 2015-04-06 DIAGNOSIS — D1431 Benign neoplasm of right bronchus and lung: Secondary | ICD-10-CM

## 2015-04-06 MED ORDER — UMECLIDINIUM-VILANTEROL 62.5-25 MCG/INH IN AEPB: 1.0000 | INHALATION_SPRAY | Freq: Every day | RESPIRATORY_TRACT | Status: DC

## 2015-04-06 MED ORDER — ARFORMOTEROL TARTRATE 15 MCG/2ML IN NEBU: 15.0000 ug | INHALATION_SOLUTION | Freq: Two times a day (BID) | RESPIRATORY_TRACT | Status: DC

## 2015-04-06 MED ORDER — IPRATROPIUM-ALBUTEROL 20-100 MCG/ACT IN AERS: INHALATION_SPRAY | RESPIRATORY_TRACT | Status: DC

## 2015-04-06 NOTE — Patient Instructions (Signed)
Order- walk test on room air  Dx COPD mixed type  Sample Anoro Inhale 1 puff, once daily  Good luck with your move to [place]

## 2015-04-06 NOTE — Progress Notes (Signed)
01/15/12- 65 yoF heavy smoker recently hosp for exacerbation of COPD and now referred for pulmonary management. Hospitalized 11/29 through 01/12/2012 with discharge diagnoses of acute respiratory failure with hypoxia, COPD, tobacco abuse.. She had moved to this area from the [place] in 2009. Since here she has had more bronchitis with season change. Recent hospital for typical exacerbation. Now in physical therapy for rehabilitation after bilateral total knee replacements (05/15/2011, 10/14/2011). She has smoked one pack per day since 1967 and states that she stopped smoking November 28, using patches. Breathing is better now than usual from prehospital baseline. She never has had a cough. Activity has been limited by her knees but she thinks she has less dyspnea on exertion now. She is not needing her Combivent but continues Spiriva. A right lower lobe nodule was found April 2013. CT chest 12/21/2011 described as stable well-circumscribed right lower lobe nodule and recommended PET. Noted diffuse old granulomatous disease with calcification including calcified mediastinal and hilar nodes, granulomas in spleen and calcified nodes in the upper abdomen. No history of TB exposure. Pulmonary embolism 2006/IVC filter. Negative VQ scan 10/21/2011. She reports having a PET scan through [facility] in [place] this summer. We need to verify that and get that report.  Treated for hypertension but denies heart disease. 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 Adopted. CT 12/21/11-reviewed IMPRESSION:  1. Stable well circumscribed nodule within the right lower lobe.  Consider PET CT for metabolic activity if indicated clinically.  2. Diffuse changes of prior granulomatous disease with calcified  left lower lobe granulomas, mediastinal and hilar calcified nodes,  calcified splenic granuloma, and calcified nodes in the upper  abdomen.  Original Report Authenticated By: Ivar Drape,  M.D.   05/16/12- 46 yoF former heavy smoker followed for  COPD, lung nodule RLL  FOLLOWS FOR: pt states breathing is doing good overall(used Rescue inhaler once since hospital stay); review PFT and mammogram results with patient. States she quit smoking cigarettes in November, 2013. Had CT chest 12/21/2011 and PET scan at [facility],  her pulmonologist at [facility] retired. Now denies shortness of breath, cough, phlegm wheeze. Using Spiriva daily. History of bilateral lumpectomy. Mammograms were negative 01/23/2012.  CXR 01/04/12 IMPRESSION:  Coarse lung markings most likely represent chronic changes but  difficult to exclude mild edema. There is no focal airspace  disease.  Nodule in the right lower lung. This was better characterized on  the chest CT from 12/21/2011.  Original Report Authenticated By: Markus Daft, M.D. PFT 05/16/2012: Mild obstructive airways disease with insignificant response to bronchodilator, normal lung volumes, diffusion mildly reduced. FVC 3.08/96%, FEV1 2.01/86%, FEV1/FVC 0.65. TLC 95%, DLCO 73%.  07/07/12- 71 yoF former heavy smoker followed for  COPD, lung nodule RLL , Hx PE/ IVC filter FOLLOWS FOR: Denies any SOB, wheezing, cough, or congestion; overall breathing is doing good per patient. Tells me in past year she has had TKR both knees and lumbar spine sgy. She gives additional medical hx of PE x2 w/ IVC filter 2006. 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 Fibromyalgia. 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 Says breathing "fine". Discussed f/u nodules.  01/06/13- 33 yoF former heavy smoker followed for  COPD, lung nodule RLL , Hx PE/ IVC filter FOLLOWS FOR:at primary md 1 wk. ago had rales,sob-worse,chest pain and tightness,cough-yellow x 2 wks.,wheezing ,fcs  Says she felt this way last year in  the fall. Recent chest congestion, cough with clear or trace yellow, tight,  back pain off and on, some low-grade fever and sweats. Using rescue inhaler for the first time in a year. Not smoking. CT chest 08/13/12 IMPRESSION:  Stable 1.9 cm smoothly marginated ovoid lesion in the right lower  lobe. IV contrast was administered today and this does not appear  to represent an AV malformation. The nodule also shows no evidence (IR did not do needle bx.) of contrast enhancement. Given all of these factors as well as the  presence of multiple abutting blood vessels which may make the  biopsy of higher risk, decision was made to not perform a  percutaneous biopsy today and follow the lesion by imaging.  Findings were discussed directly with Dr. Annamaria Boots.  Original Report Authenticated By: Aletta Edouard, M.D. PET elsewhere 06/29/11- IVC filter, old granulomatous disease. RLL nodule 18/16 mm, SUV 1.4 PET 07/16/12 IMPRESSION:  1.9 x 1.8 cm right lower lobe pulmonary nodule, with suspected slow  interval growth and associated vague hypermetabolism, max SUV 2.5.  While indeterminate, the overall appearance is worrisome for  primary bronchogenic neoplasm. Consider bronchoscopy or  percutaneous biopsy for further evaluation.  No evidence of metastatic disease.  Original Report Authenticated By: Julian Hy, M.D  07/14/13- 60 yoF former heavy smoker followed for  COPD, lung nodule RLL , Hx PE/ IVC filter, hx discoid lupus rash FOLLOWS FOR:No trouble with breathing as long as she is not in pain. Per note last visit, IR did not bx nodule. Moved to old home with many steps. Using Combivent less than once daily while continuing Spiriva. Discoid lupus rash is worse in heat CT 07/14/13 IMPRESSION:  Old granulomatous disease.  Stable 3 mm RIGHT upper lobe noncalcified pulmonary nodule.  Stable lobulated RIGHT lower lobe mass ; due to growth on prior exam  and hypermetabolism on PET-CT, recommend continued surveillance to  exclude malignancy.  No new intrathoracic abnormalities.   Electronically Signed  By: Lavonia Dana M.D.  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 Tried Spiriva but couldn't manage the dispenser well. She relates dyspnea on exertion to obesity and deconditioning, noticed mainly on stairs.  06/08/14- 68 yoF former heavy smoker (quit 3-4 yrs ago), former scrub nurse followed for  COPD, lung nodule RLL/ Carcinoid, 31m RUL nodule , Hx PE/ IVC filter, hx discoid lupus rash, hx bilateral breast Ca lumpectomies Guided bronch/ Dr BLamonte Sakai+ carcinoid RLL.  Friend and son here, daughter was on speaker phone. Since bronch she has had focal R chest pain in mid clavicular line, tender to pressure but not pleuritic. Cough w scant clear mucus and hoarseness from  Bronch are clearing. Had some loose stools ? Dark. CT chest 05/05/14 IMPRESSION: Continued interval growth of the right lower lobe pulmonary nodule, now measuring 2.6 cm in diameter. Neoplasm remains a concern. Otherwise stable exam. Electronically Signed  By: EMisty StanleyM.D.  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 Pt has fallen twice since surgery-unsure why. Most recent event was 3 days ago.   Son here Uses cane after bilat TKR. Hooked toe walking and tripped. Still having R post thoracotomy pain, slowly easing. PFT 06/14/14- Minimal obstructive disease, insig response to BD, Minimal  Diffusion defect. FVC 2.90/ 83%, 2.16/ 81%, FEV1/FVC 0.75, TLC 95%, DLCO 72%. Occasional wheeze. Discussed exercise. CT chest 09/04/14 IMPRESSION: 1. Negative for pulmonary embolus 2. Right lower lobectomy with treatment related changes 3. Left thyroid 3 cm mass or cyst. Recommend thyroid sonography for Characterization.-------> Her PCP to manage this Electronically Signed  By: Andreas Newport M.D.  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 states breathing hasn't improved. combivent, duoneb doesn't seem to be working. SOB wrosen x1 wk. wheezing. soreness of RL side pain most of the time sometimes sharpe when deep breathe. dry cough and chills. at [facility] two weeks ago with possible PE.   Son here. Finances very tight. Concerned cost of ibuprofen, heating pad. Pain gradual onset x 2 weeks, R mid ax line, lower rib, point tender, increased by cough or twist. Taking Vicodin and Ativan from pain clinic. Leg dopplers neg 8/19 D-dimer 0.55 on 9/17 VQ scan 10/23/14 IMPRESSION: Intermediate probability for pulmonary embolism. Electronically Signed   By: RSkipper ClicheM.D.  On: 10/23/2014 08:18 CXR 10/22/14 IMPRESSION: No active disease. Stable chronic blunting of the right costophrenic angle. Degenerative changes thoracic spine. Electronically Signed  By: LLahoma CrockerM.D.  On: 10/22/2014 23:27  04/06/2015-69 yoF former heavy smoker , former scrub nurse followed for  COPD, lung nodule RLL/ Carcinoid/right lower lobectomy, 355mRUL nodule , Hx PE/ IVC filter, complicated by hx discoid lupus rash, hx bilateral breast Ca lumpectomies, old calcified granulomas FOLLOWS FOR:  Pt states that she has noticed increased SOB and  wheezing with exertion. Pt states that she has 18 steps in her house and finds it very hard to climb them. [place] 04[place]09/15.  She has not qualified for home oxygen. On walk test in the office today she did not drop below 92% on room air. Using Brovana neb with some benefit. She didn't even remember that she had DuoNeb. Little cough, little phlegm, no chest pain or palpitation.  ROS-see HPI Constitutional:   No-   weight loss, night sweats, +fevers, chills, fatigue, lassitude. HEENT:   No-  headaches, difficulty swallowing, tooth/dental problems, sore throat,       No-  sneezing, itching, ear ache, nasal congestion, post nasal drip,  CV:  +chest pain, orthopnea, PND, swelling in lower extremities, anasarca,  dizziness, palpitations Resp: +shortness of breath with exertion or at rest.              +cough,  No non-productive cough,  No- coughing up of blood.              No-   change in color of mucus.  + wheezing.   Skin: + rash or lesions. GI:  No-   heartburn, indigestion, abdominal pain, nausea, vomiting,  GU: . MS:  No-   joint pain or swelling. Neuro-  nothing unusual Psych:  No- change in mood or affect. No depression or anxiety.  No memory loss.  OBJ- Physical Exam General- Alert, Oriented, Affect-appropriate, Distress- none acute, +obese, hair grown back. Skin- + rash dorsum left hand reported discoid lupus Lymphadenopathy- none Head- atraumatic            Eyes- Gross vision intact, PERRLA, conjunctivae and secretions clear            Ears- Hearing, canals-normal            Nose- Clear, no-Septal dev, mucus, polyps, erosion, perforation             Throat- Mallampati II , mucosa clear , drainage- none, tonsils- atrophic. Dentures Neck- flexible , trachea midline, no stridor , thyroid nl, carotid no bruit Chest - symmetrical excursion , unlabored           Heart/CV- RRR , no murmur , no gallop  , no rub, nl s1 s2                            - JVD- none , edema- none, stasis changes- none, varices- none           Lung- assigned coarse/clear/unlabored breath sounds, wheeze- none, cough- none , dullness-none, rub- none           Chest wall-  Abd-  Br/ Gen/ Rectal- Not done, not indicated Extrem- cyanosis- none, clubbing, none, atrophy- none, strength- nl.               + bilateral TKR scars, Cane Neuro- grossly intact to observation

## 2015-04-06 NOTE — Assessment & Plan Note (Signed)
Status post right lower lobectomy

## 2015-04-06 NOTE — Assessment & Plan Note (Signed)
She seems to be at baseline now without acute exacerbation. Dyspnea mostly reflects obesity and deconditioning. She did not desaturate on walk test in the office today. Because she is moving from the area, we're not going to do other testing. She should have an annual chest x-ray. Discussed medications. Sample Anoro to try.

## 2016-06-11 ENCOUNTER — Ambulatory Visit (HOSPITAL_COMMUNITY)
Admission: EM | Admit: 2016-06-11 | Discharge: 2016-06-11 | Disposition: A | Discharge status: Home / Self Care | Payer: Medicare Other | Attending: Internal Medicine | Admitting: Internal Medicine

## 2016-06-11 ENCOUNTER — Encounter (HOSPITAL_COMMUNITY): Payer: Self-pay | Admitting: Emergency Medicine

## 2016-06-11 DIAGNOSIS — B029 Zoster without complications: Secondary | ICD-10-CM | POA: Diagnosis not present

## 2016-06-11 MED ORDER — ACYCLOVIR 800 MG PO TABS: 800.0000 mg | ORAL_TABLET | Freq: Four times a day (QID) | ORAL | 0 refills | Status: DC

## 2016-06-11 MED ORDER — OXYCODONE-ACETAMINOPHEN 5-325 MG PO TABS: 2.0000 | ORAL_TABLET | ORAL | 0 refills | Status: DC | PRN

## 2016-06-11 NOTE — ED Triage Notes (Signed)
The patient presented to the [facility] with a complaint of a rash on her back that presents as shingles.

## 2016-06-11 NOTE — ED Provider Notes (Signed)
CSN: [id number]     Arrival date & time 06/11/16  1520 History   None    Chief Complaint  Patient presents with  . Rash   (Consider location/radiation/quality/duration/timing/severity/associated sxs/prior Treatment) Pt complains of a painful rash on her back.  Pt reports she tinks she may have shingles,     The history is provided by the patient. No language interpreter was used.  Rash  Quality: blistering and painful   Pain details:    Severity:  Moderate   Onset quality:  Gradual   Timing:  Constant   Progression:  Worsening Severity:  Moderate Timing:  Constant Progression:  Worsening Relieved by:  Nothing Worsened by:  Nothing  Pt complains of a rash  Past Medical History:  Diagnosis Date  . Anxiety    takes Ativan daily  . Arthritis   . Bleeding ulcer 1974  . Cancer ([place])    breast hx  . COPD (chronic obstructive pulmonary disease) ([place])   . Fibromyalgia    takes Lyrica daily  . Headache(784.0)    takes Tegretol and Verapamil nightly  . History of blood transfusion    no abnormal reaction noted  . History of bronchitis   . History of colon polyps    benign  . History of kidney stones   . History of ulcer disease    pyloric   . Hyperlipidemia    takes Fenofibrate daily  . Hypertension    takes Toprol daily  . Hypertension   . Joint pain   . Lung mass April 2013   Right lower lobe lung mass  . Lupus    takes Cymbalta,Plaquenil  daily  . Pneumonia    hx of  . PTSD (post-traumatic stress disorder)    takes Cymbalta daily  . Pulmonary embolism ([place]) 2006   IVC filter  . Seasonal allergies    flonase prn  . Vertigo    takes Antivert prn   Past Surgical History:  Procedure Laterality Date  . ABDOMINAL HYSTERECTOMY  1992  . APPENDECTOMY  1965  . BACK SURGERY  2000/2010   fusion  . BREAST SURGERY     bilateral partial masectomy  . CARDIAC CATHETERIZATION  1992   no PCI  . CHOLECYSTECTOMY  1994  . COLONOSCOPY    . CYSTOSCOPY     multiple  .  DILATION AND CURETTAGE OF UTERUS    . HERNIA REPAIR    . INCISIONAL HERNIA REPAIR    . IVC filter    . JOINT REPLACEMENT Bilateral    Knees  . KNEE ARTHROSCOPY Bilateral   . MULTIPLE TOOTH EXTRACTIONS  1990  . THORACOTOMY/LOBECTOMY Right 07/15/2014   Procedure: RIGHT THORACOTOMY/RIGHT LOWER LOBE  LOBECTOMY;  Surgeon: Gaye Pollack, MD;  Location: [facility] OR;  Service: Thoracic;  Laterality: Right;  . TONSILLECTOMY  1967  . TOTAL KNEE ARTHROPLASTY  06/04/2011   Procedure: TOTAL KNEE ARTHROPLASTY;  Surgeon: Rudean Haskell, MD;  Location: [facility];  Service: Orthopedics;  Laterality: Right;  . TOTAL KNEE ARTHROPLASTY  10/15/2011   Procedure: TOTAL KNEE ARTHROPLASTY;  Surgeon: Rudean Haskell, MD;  Location: [facility];  Service: Orthopedics;  Laterality: Left;  . [place]  . VENA CAVA FILTER PLACEMENT  2006  . VIDEO BRONCHOSCOPY WITH ENDOBRONCHIAL NAVIGATION N/A 06/02/2014   Procedure: VIDEO BRONCHOSCOPY WITH ENDOBRONCHIAL NAVIGATION;  Surgeon: Collene Gobble, MD;  Location: [facility] OR;  Service: Thoracic;  Laterality: N/A;   Family History  Problem Relation Age  of Onset  . Adopted: Yes  . Anesthesia problems Neg Hx   . Hypotension Neg Hx   . Malignant hyperthermia Neg Hx   . Pseudochol deficiency Neg Hx    Social History  Substance Use Topics  . Smoking status: Former Smoker    Packs/day: 0.75    Years: 7.00    Types: Cigarettes    Quit date: 06/15/2012  . Smokeless tobacco: Never Used     Comment: quit smoking 76yr ago  . Alcohol use No   OB History    No data available     Review of Systems  Skin: Positive for rash.  All other systems reviewed and are negative.   Allergies  Imitrex [sumatriptan base]; Ketorolac tromethamine; Stadol [butorphanol tartrate]; Ivp dye [iodinated diagnostic agents]; and Shellfish allergy  Home Medications   Prior to Admission medications   Medication Sig Start Date End Date Taking? Authorizing Provider  arformoterol (BROVANA) 15 MCG/2ML NEBU  Take 2 mLs (15 mcg total) by nebulization 2 (two) times daily. 04/06/15   YDeneise Lever MD  clotrimazole-betamethasone (LOTRISONE) cream Apply 1 application topically 2 (two) times daily as needed (skin irritation).     [provider]  DULoxetine (CYMBALTA) 60 MG capsule Take 120 mg by mouth daily. Daily at bedtime    [provider]  fenofibrate micronized (LOFIBRA) 134 MG capsule Take 134 mg by mouth daily before breakfast.    [provider]  furosemide (LASIX) 20 MG tablet Take 1 tablet (20 mg total) by mouth daily. 01/12/12   LOswald Hillock MD  HYDROcodone-acetaminophen (NORCO) 10-325 MG tablet Take 1 tablet by mouth every 6 (six) hours as needed.    [provider]  hydroxychloroquine (PLAQUENIL) 200 MG tablet Take 200 mg by mouth 2 (two) times daily.    [provider]  hydrOXYzine (ATARAX/VISTARIL) 25 MG tablet Take 50 mg by mouth at bedtime as needed for anxiety or itching (RLS). Reported on 04/06/2015    [provider]  Ipratropium-Albuterol (COMBIVENT RESPIMAT) 20-100 MCG/ACT AERS respimat INHALE TWO PUFFS INTO LUNGS  EVERY 6 HOURS AS NEEDED FOR SHORTNESS OF BREATH 04/06/15   Young, CTarri FullerD, MD  LORazepam (ATIVAN) 2 MG tablet Take 2 mg by mouth 3 (three) times daily as needed for anxiety.     [provider]  metoprolol succinate (TOPROL-XL) 100 MG 24 hr tablet Take 1 tablet (100 mg total) by mouth daily. Take with or immediately following a meal. 01/12/12   LOswald Hillock MD  pregabalin (LYRICA) 75 MG capsule Take 75 mg by mouth 2 (two) times daily.    [provider]  umeclidinium-vilanterol (ANORO ELLIPTA) 62.5-25 MCG/INH AEPB Inhale 1 puff into the lungs daily. 04/06/15   YBaird LyonsD, MD  verapamil (COVERA HS) 240 MG (CO) 24 hr tablet Take 240 mg by mouth at bedtime.    [provider]  Vitamin D, Ergocalciferol, (DRISDOL) 50000 UNITS CAPS capsule Take 50,000 Units by mouth every 7 (seven) days.     [provider]   Meds Ordered and Administered this Visit  Medications - No data to display  BP (!) 138/58 (BP Location: Right Arm)   Pulse 68   Temp 98.7 F (37.1 C) (Oral)   Resp 18   LMP 06/04/2011   SpO2 99%  No data found.   Physical Exam  Constitutional: She appears well-developed and well-nourished.  Cardiovascular: Normal rate.   Pulmonary/Chest: Effort normal.  Abdominal: Soft.  Musculoskeletal: Normal range  of motion.  Neurological: She is alert.  Skin: Skin is warm.  3x4 cm area of erythematous blistered areas, dermatomal  Psychiatric: She has a normal mood and affect.    Urgent Care Course     Procedures (including critical care time)  Labs Review Labs Reviewed - No data to display  Imaging Review No results found.   Visual Acuity Review  Right Eye Distance:   Left Eye Distance:   Bilateral Distance:    Right Eye Near:   Left Eye Near:    Bilateral Near:         MDM   1. Herpes zoster without complication    Meds ordered this encounter  Medications  . acyclovir (ZOVIRAX) 800 MG tablet    Sig: Take 1 tablet (800 mg total) by mouth 4 (four) times daily.    Dispense:  40 tablet    Refill:  0    Order Specific Question:   Supervising Provider    Answer:   Sherlene Shams [[id number]]  . oxyCODONE-acetaminophen (PERCOCET/ROXICET) 5-325 MG tablet    Sig: Take 2 tablets by mouth every 4 (four) hours as needed for severe pain.    Dispense:  16 tablet    Refill:  0    Order Specific Question:   Supervising Provider    Answer:   Sherlene Shams [[id number]]   An After Visit Summary was printed and given to the patient.     Fransico Meadow, [place] 06/11/16 2761

## 2016-07-02 ENCOUNTER — Ambulatory Visit (HOSPITAL_COMMUNITY)
Admission: EM | Admit: 2016-07-02 | Discharge: 2016-07-02 | Disposition: A | Discharge status: Home / Self Care | Payer: Medicare Other | Attending: Family Medicine | Admitting: Family Medicine

## 2016-07-02 ENCOUNTER — Encounter (HOSPITAL_COMMUNITY): Payer: Self-pay | Admitting: Emergency Medicine

## 2016-07-02 DIAGNOSIS — A084 Viral intestinal infection, unspecified: Secondary | ICD-10-CM | POA: Diagnosis not present

## 2016-07-02 MED ORDER — ONDANSETRON 4 MG PO TBDP
8.0000 mg | ORAL_TABLET | Freq: Once | ORAL | Status: AC
  Administered 2016-07-02: 8 mg via ORAL

## 2016-07-02 MED ORDER — DIPHENOXYLATE-ATROPINE 2.5-0.025 MG PO TABS: 1.0000 | ORAL_TABLET | Freq: Four times a day (QID) | ORAL | 0 refills | Status: DC | PRN

## 2016-07-02 MED ORDER — DICYCLOMINE HCL 20 MG PO TABS: 20.0000 mg | ORAL_TABLET | Freq: Two times a day (BID) | ORAL | 0 refills | Status: DC

## 2016-07-02 MED ORDER — ONDANSETRON 4 MG PO TBDP
ORAL_TABLET | ORAL | Status: AC
Start: 2016-07-02 — End: 2016-07-02
  Filled 2016-07-02: qty 2

## 2016-07-02 MED ORDER — ONDANSETRON 4 MG PO TBDP: 4.0000 mg | ORAL_TABLET | Freq: Three times a day (TID) | ORAL | 0 refills | Status: DC | PRN

## 2016-07-02 MED ORDER — OMEPRAZOLE 40 MG PO CPDR: 40.0000 mg | DELAYED_RELEASE_CAPSULE | Freq: Two times a day (BID) | ORAL | 0 refills | Status: DC

## 2016-07-02 NOTE — ED Triage Notes (Addendum)
Nausea, vomiting and diarrhea since late Thursday night.  3 vomiting episodes today, 4 diarrhea episodes today.  Now starting to feel weak  Ran out of lyrica 10 days ago Clonazepam started one week ago resulti started one week ago

## 2016-07-02 NOTE — Discharge Instructions (Signed)
I have prescribed several medicines for your condition, take these as directed. Recommend clear liquid diet for the next 24-48 hours, then slowly reintroduce simple foods back into her diet such as bananas, rice, toast. If at any time your symptoms worsen, go to the ER.

## 2016-07-02 NOTE — ED Provider Notes (Signed)
CSN: [id number]     Arrival date & time 07/02/16  1625 History   First MD Initiated Contact with Patient 07/02/16 1802     No chief complaint on file.  (Consider location/radiation/quality/duration/timing/severity/associated sxs/prior Treatment) 71 year old female presents to clinic with three-day history of nausea, vomiting, and diarrhea.    Abdominal Pain  Pain location:  Generalized Pain quality: cramping and gnawing   Pain quality: not bloating   Pain radiates to:  Does not radiate Pain severity:  Moderate Onset quality:  Gradual Duration:  3 days Timing:  Constant Progression:  Waxing and waning Chronicity:  New Context: not diet changes, not eating, not recent travel, not sick contacts, not suspicious food intake and not trauma   Relieved by:  None tried Worsened by:  Eating Ineffective treatments:  None tried Associated symptoms: chills, diarrhea, fever, nausea and vomiting   Associated symptoms: no constipation and no dysuria     Past Medical History:  Diagnosis Date  . Anxiety    takes Ativan daily  . Arthritis   . Bleeding ulcer 1974  . Cancer ([place])    breast hx  . COPD (chronic obstructive pulmonary disease) ([place])   . Fibromyalgia    takes Lyrica daily  . Headache(784.0)    takes Tegretol and Verapamil nightly  . History of blood transfusion    no abnormal reaction noted  . History of bronchitis   . History of colon polyps    benign  . History of kidney stones   . History of ulcer disease    pyloric   . Hyperlipidemia    takes Fenofibrate daily  . Hypertension    takes Toprol daily  . Hypertension   . Joint pain   . Lung mass April 2013   Right lower lobe lung mass  . Lupus    takes Cymbalta,Plaquenil  daily  . Pneumonia    hx of  . PTSD (post-traumatic stress disorder)    takes Cymbalta daily  . Pulmonary embolism ([place]) 2006   IVC filter  . Seasonal allergies    flonase prn  . Vertigo    takes Antivert prn   Past Surgical History:   Procedure Laterality Date  . ABDOMINAL HYSTERECTOMY  1992  . APPENDECTOMY  1965  . BACK SURGERY  2000/2010   fusion  . BREAST SURGERY     bilateral partial masectomy  . CARDIAC CATHETERIZATION  1992   no PCI  . CHOLECYSTECTOMY  1994  . COLONOSCOPY    . CYSTOSCOPY     multiple  . DILATION AND CURETTAGE OF UTERUS    . HERNIA REPAIR    . INCISIONAL HERNIA REPAIR    . IVC filter    . JOINT REPLACEMENT Bilateral    Knees  . KNEE ARTHROSCOPY Bilateral   . MULTIPLE TOOTH EXTRACTIONS  1990  . THORACOTOMY/LOBECTOMY Right 07/15/2014   Procedure: RIGHT THORACOTOMY/RIGHT LOWER LOBE  LOBECTOMY;  Surgeon: Gaye Pollack, MD;  Location: [facility] OR;  Service: Thoracic;  Laterality: Right;  . TONSILLECTOMY  1967  . TOTAL KNEE ARTHROPLASTY  06/04/2011   Procedure: TOTAL KNEE ARTHROPLASTY;  Surgeon: Rudean Haskell, MD;  Location: [facility];  Service: Orthopedics;  Laterality: Right;  . TOTAL KNEE ARTHROPLASTY  10/15/2011   Procedure: TOTAL KNEE ARTHROPLASTY;  Surgeon: Rudean Haskell, MD;  Location: [facility];  Service: Orthopedics;  Laterality: Left;  . [place]  . VENA CAVA FILTER PLACEMENT  2006  . VIDEO BRONCHOSCOPY WITH ENDOBRONCHIAL NAVIGATION  N/A 06/02/2014   Procedure: VIDEO BRONCHOSCOPY WITH ENDOBRONCHIAL NAVIGATION;  Surgeon: Collene Gobble, MD;  Location: [facility] OR;  Service: Thoracic;  Laterality: N/A;   Family History  Problem Relation Age of Onset  . Adopted: Yes  . Anesthesia problems Neg Hx   . Hypotension Neg Hx   . Malignant hyperthermia Neg Hx   . Pseudochol deficiency Neg Hx    Social History  Substance Use Topics  . Smoking status: Former Smoker    Packs/day: 0.75    Years: 7.00    Types: Cigarettes    Quit date: 06/15/2012  . Smokeless tobacco: Never Used     Comment: quit smoking 71yrs ago  . Alcohol use No   OB History    No data available     Review of Systems  Constitutional: Positive for chills and fever.  HENT: Negative.   Respiratory: Negative.    Cardiovascular: Negative.   Gastrointestinal: Positive for abdominal pain, diarrhea, nausea and vomiting. Negative for constipation.  Genitourinary: Negative for dysuria.  Musculoskeletal: Negative.   Skin: Negative.   Neurological: Negative.     Allergies  Imitrex [sumatriptan base]; Ketorolac tromethamine; Stadol [butorphanol tartrate]; Ivp dye [iodinated diagnostic agents]; and Shellfish allergy  Home Medications   Prior to Admission medications   Medication Sig Start Date End Date Taking? Authorizing Provider  acyclovir (ZOVIRAX) 800 MG tablet Take 1 tablet (800 mg total) by mouth 4 (four) times daily. 06/11/16   Fransico Meadow, PA-C  arformoterol (BROVANA) 15 MCG/2ML NEBU Take 2 mLs (15 mcg total) by nebulization 2 (two) times daily. 04/06/15   Deneise Lever, MD  clotrimazole-betamethasone (LOTRISONE) cream Apply 1 application topically 2 (two) times daily as needed (skin irritation).     [provider]  dicyclomine (BENTYL) 20 MG tablet Take 1 tablet (20 mg total) by mouth 2 (two) times daily. 07/02/16   Barnet Glasgow, NP  diphenoxylate-atropine (LOMOTIL) 2.5-0.025 MG tablet Take 1 tablet by mouth 4 (four) times daily as needed for diarrhea or loose stools. 07/02/16   Barnet Glasgow, NP  DULoxetine (CYMBALTA) 60 MG capsule Take 120 mg by mouth daily. Daily at bedtime    [provider]  fenofibrate micronized (LOFIBRA) 134 MG capsule Take 134 mg by mouth daily before breakfast.    [provider]  furosemide (LASIX) 20 MG tablet Take 1 tablet (20 mg total) by mouth daily. 01/12/12   Oswald Hillock, MD  HYDROcodone-acetaminophen (NORCO) 10-325 MG tablet Take 1 tablet by mouth every 6 (six) hours as needed.    [provider]  hydroxychloroquine (PLAQUENIL) 200 MG tablet Take 200 mg by mouth 2 (two) times daily.    [provider]  hydrOXYzine (ATARAX/VISTARIL) 25 MG tablet Take 50 mg by mouth at bedtime as needed for anxiety or itching  (RLS). Reported on 04/06/2015    [provider]  Ipratropium-Albuterol (COMBIVENT RESPIMAT) 20-100 MCG/ACT AERS respimat INHALE TWO PUFFS INTO LUNGS  EVERY 6 HOURS AS NEEDED FOR SHORTNESS OF BREATH 04/06/15   Young, Tarri Fuller D, MD  LORazepam (ATIVAN) 2 MG tablet Take 2 mg by mouth 3 (three) times daily as needed for anxiety.     [provider]  metoprolol succinate (TOPROL-XL) 100 MG 24 hr tablet Take 1 tablet (100 mg total) by mouth daily. Take with or immediately following a meal. 01/12/12   Oswald Hillock, MD  omeprazole (PRILOSEC) 40 MG capsule Take 1 capsule (40 mg total) by mouth 2 (two) times daily. 07/02/16  07/16/16  Barnet Glasgow, NP  ondansetron (ZOFRAN ODT) 4 MG disintegrating tablet Take 1 tablet (4 mg total) by mouth every 8 (eight) hours as needed for nausea or vomiting. 07/02/16   Barnet Glasgow, NP  oxyCODONE-acetaminophen (PERCOCET/ROXICET) 5-325 MG tablet Take 2 tablets by mouth every 4 (four) hours as needed for severe pain. 06/11/16   Fransico Meadow, PA-C  pregabalin (LYRICA) 75 MG capsule Take 75 mg by mouth 2 (two) times daily. Ran out of this med 10 days ago    [provider]  umeclidinium-vilanterol (ANORO ELLIPTA) 62.5-25 MCG/INH AEPB Inhale 1 puff into the lungs daily. 04/06/15   Baird Lyons D, MD  verapamil (COVERA HS) 240 MG (CO) 24 hr tablet Take 240 mg by mouth at bedtime.    [provider]  Vitamin D, Ergocalciferol, (DRISDOL) 50000 UNITS CAPS capsule Take 50,000 Units by mouth every 7 (seven) days.    [provider]   Meds Ordered and Administered this Visit   Medications  ondansetron (ZOFRAN-ODT) disintegrating tablet 8 mg (8 mg Oral Given 07/02/16 1832)    BP 131/67 (BP Location: Left Arm)   Pulse 63   Temp 97.9 F (36.6 C) (Oral)   Resp (!) 22   LMP 06/04/2011   SpO2 100%  No data found.   Physical Exam  Constitutional: She is oriented to person, place, and time. She appears well-developed and  well-nourished. No distress.  HENT:  Head: Normocephalic.  Right Ear: External ear normal.  Left Ear: External ear normal.  Eyes: Conjunctivae are normal.  Neck: Normal range of motion.  Cardiovascular: Normal rate and regular rhythm.   Pulmonary/Chest: Effort normal and breath sounds normal.  Abdominal: Soft. Bowel sounds are normal. There is no tenderness.  Neurological: She is alert and oriented to person, place, and time.  Skin: Skin is warm and dry. Capillary refill takes less than 2 seconds. She is not diaphoretic.  Psychiatric: She has a normal mood and affect. Her behavior is normal.  Nursing note and vitals reviewed.   Urgent Care Course     Procedures (including critical care time)  Labs Review Labs Reviewed - No data to display  Imaging Review No results found.     MDM   1. Viral gastroenteritis    Most likely viral gastroenteritis, provided counseling on diabetic, fluid intake, given prescription for Zofran, Prilosec, Lomotil, Bentyl. Provided strict follow-up guidelines. Return to clinic as needed.    Barnet Glasgow, NP 07/02/16 [phone]

## 2016-07-29 IMAGING — CR DG CHEST 2V
2 series · 2 of 2 positions shown · non-contrast
Comparison: Chest x-ray of April 08, 2014 and PET-CT study May 14, 2014

CLINICAL DATA: Preoperative examination prior to right-sided
thoracotomy for carcinoid tumor ; history of COPD and breast
malignancy; discontinued smoking 5 years ago.

EXAM:
CHEST  2 VIEW

[w chest pa]
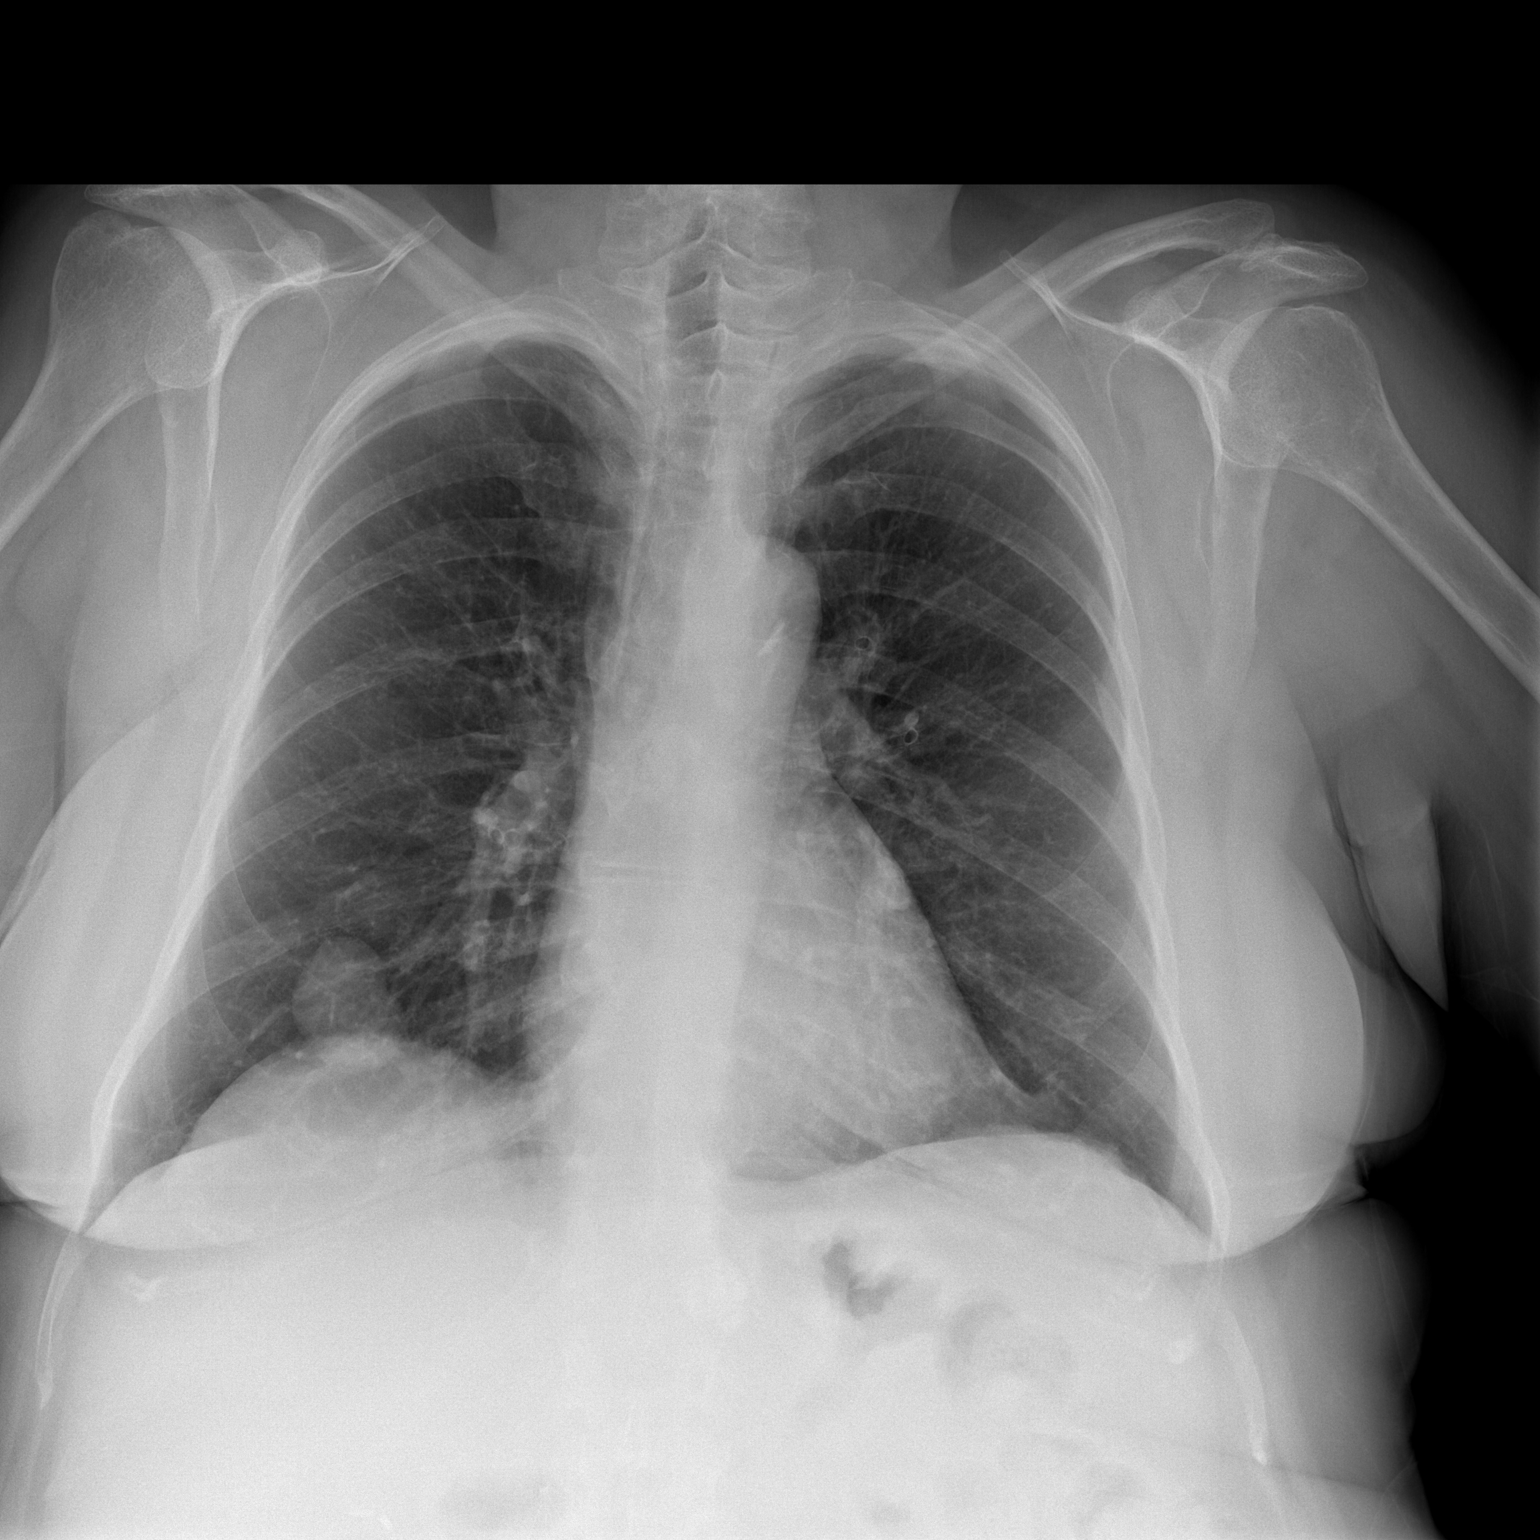

[w chest lat]
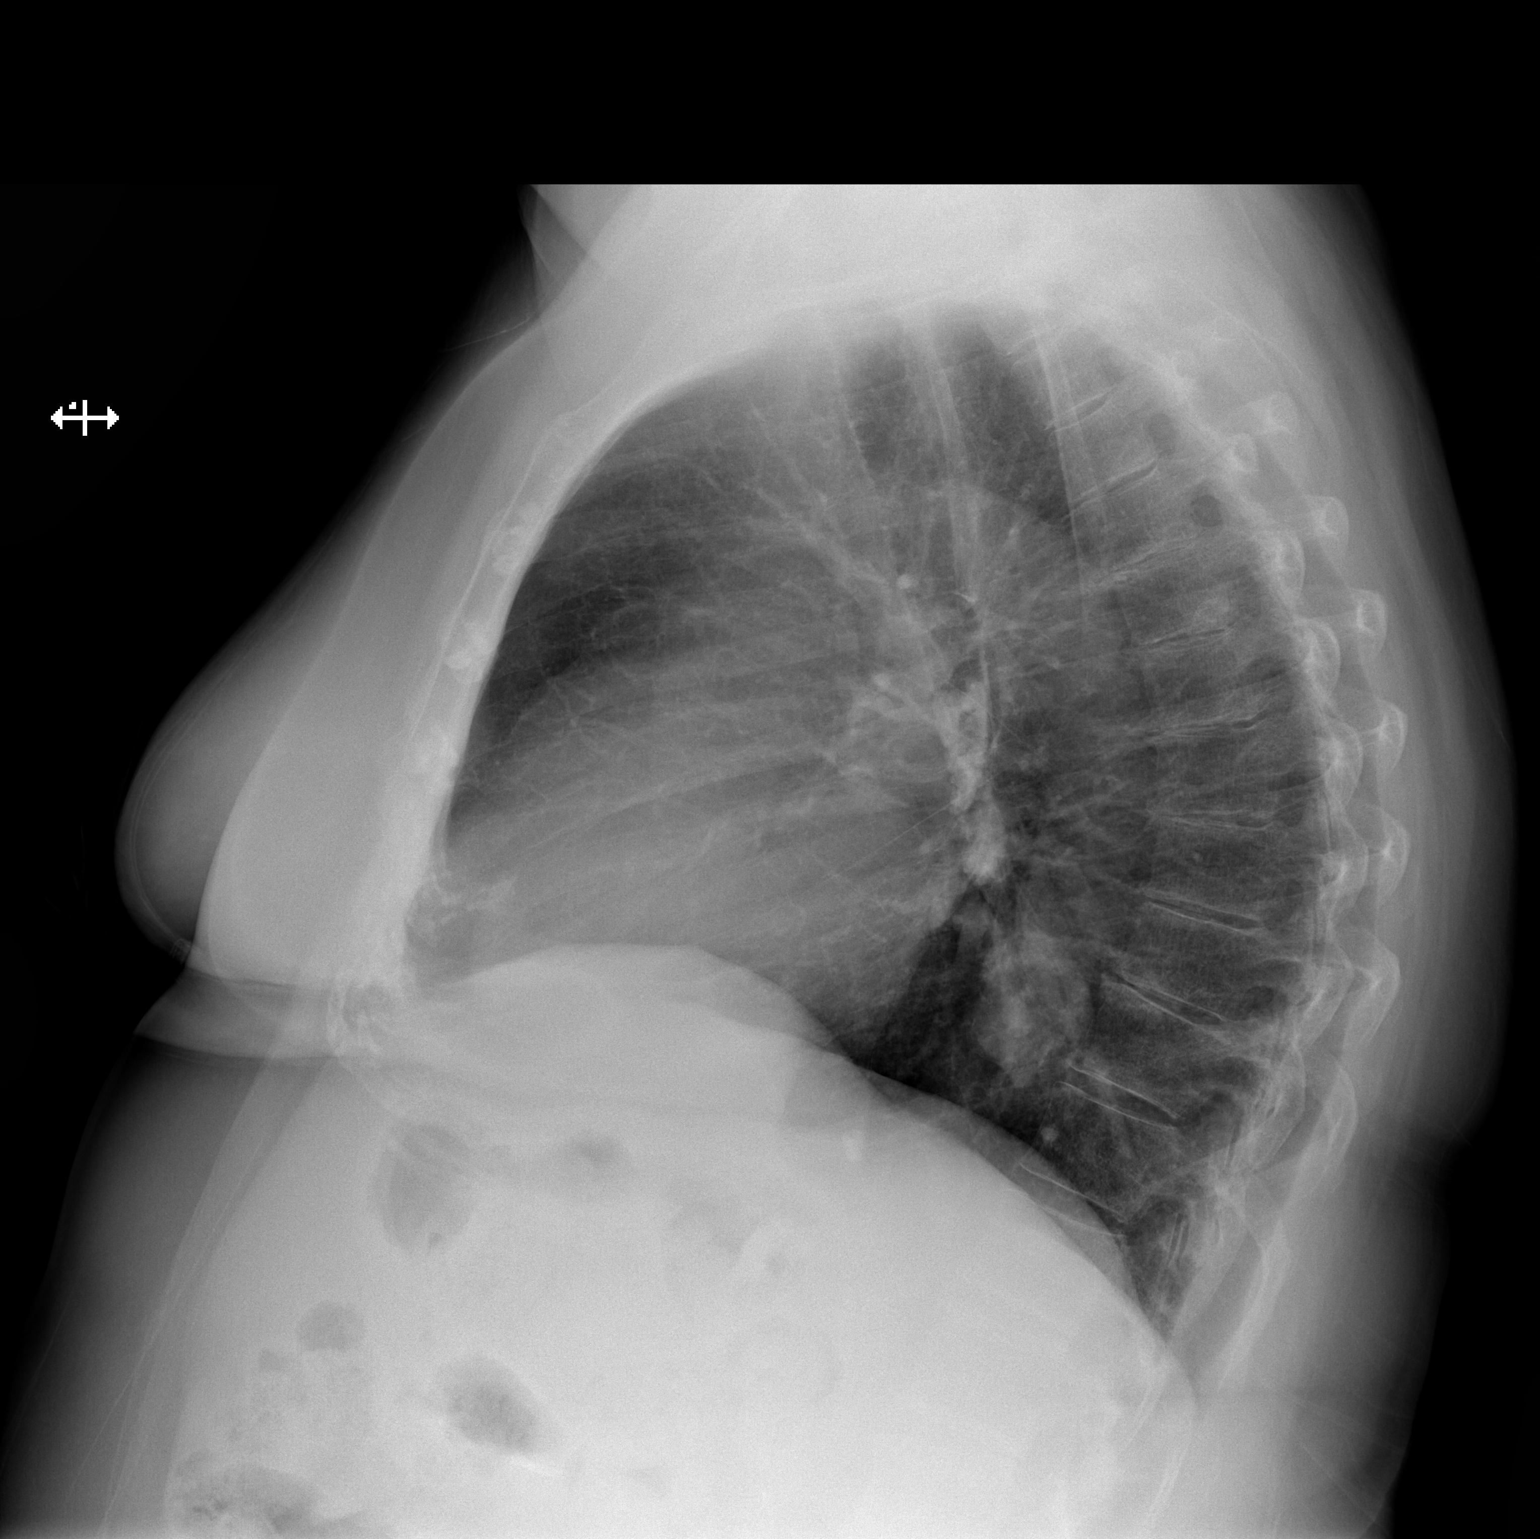

[2 of 2 positions shown; findings below may reference images not displayed]

FINDINGS: The lungs are mildly hyperinflated. There is a stable appearing
right lower lobe pulmonary parenchymal mass. It measures 3.3 cm AP
by 2.9 cm transversely by 3.6 cm in superior to inferior dimension.
There is no parenchymal mass elsewhere. There is no pleural effusion
or pneumothorax. The heart and pulmonary vascularity are normal.
There is multilevel degenerative disc space narrowing of the
thoracic spine.
IMPRESSION: COPD. Right lower lobe pulmonary nodule exhibiting mild interval
growth. Otherwise no acute cardiopulmonary abnormality.

## 2016-07-31 IMAGING — CR DG CHEST 1V PORT
1 series · 1 of 1 positions shown · non-contrast
Comparison: 07/13/2014

CLINICAL DATA: Chest tube and central line placement after
lobectomy

EXAM:
PORTABLE CHEST - 1 VIEW

[AP]
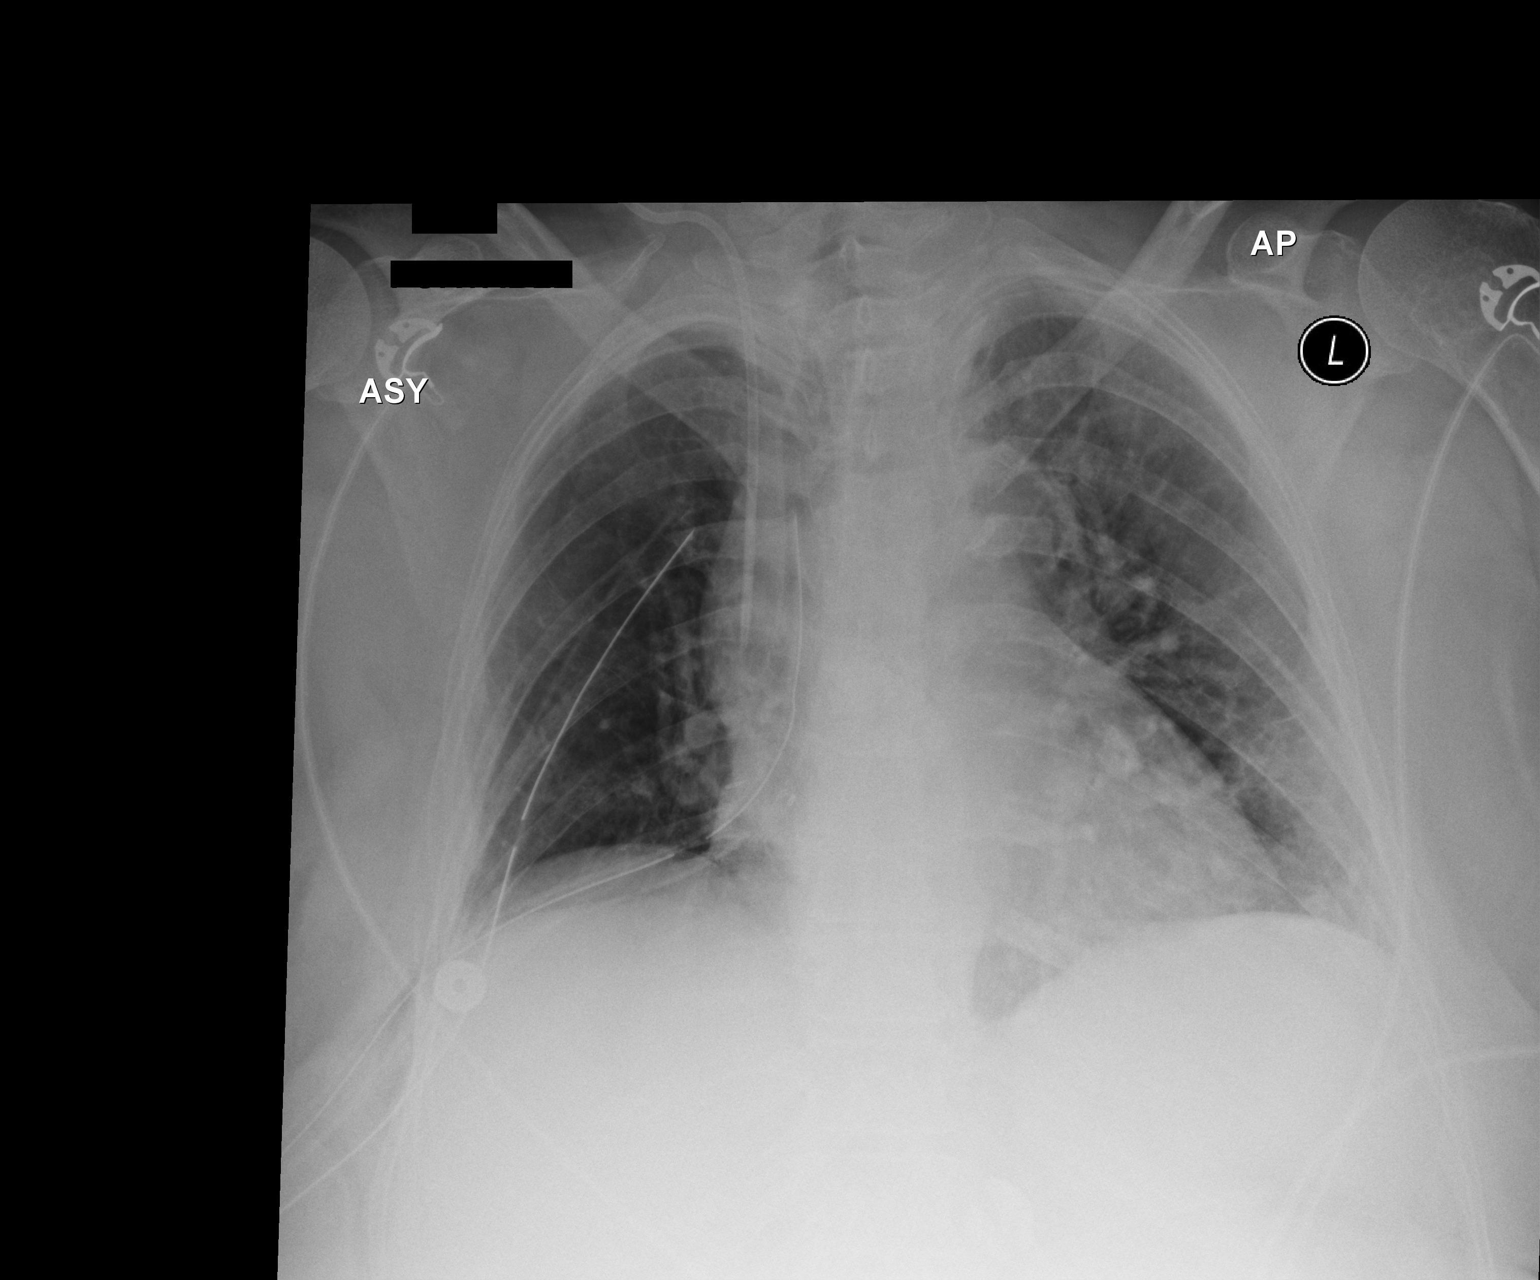

[1 of 1 positions shown; findings below may reference images not displayed]

FINDINGS: There are 2 right-sided chest tubes which are in unremarkable
position. Right IJ central line, tip at the SVC level.

Postoperative hypoventilation and subsegmental atelectasis. Apparent
increase in cardiac and upper mediastinal size is likely from
technical factors. No visible pneumothorax.
IMPRESSION: 1. Unremarkable positioning of right chest tubes and central line.
2. Hypoventilation and subsegmental atelectasis.
3. No pneumothorax.

## 2016-08-02 ENCOUNTER — Other Ambulatory Visit: Payer: Self-pay | Admitting: Nurse Practitioner

## 2016-08-02 DIAGNOSIS — Z1231 Encounter for screening mammogram for malignant neoplasm of breast: Secondary | ICD-10-CM

## 2016-08-03 IMAGING — CR DG CHEST 1V PORT
1 series · 1 of 1 positions shown · non-contrast
Comparison: 07/17/2014

CLINICAL DATA: Check chest tube position

EXAM:
PORTABLE CHEST - 1 VIEW

[AP]
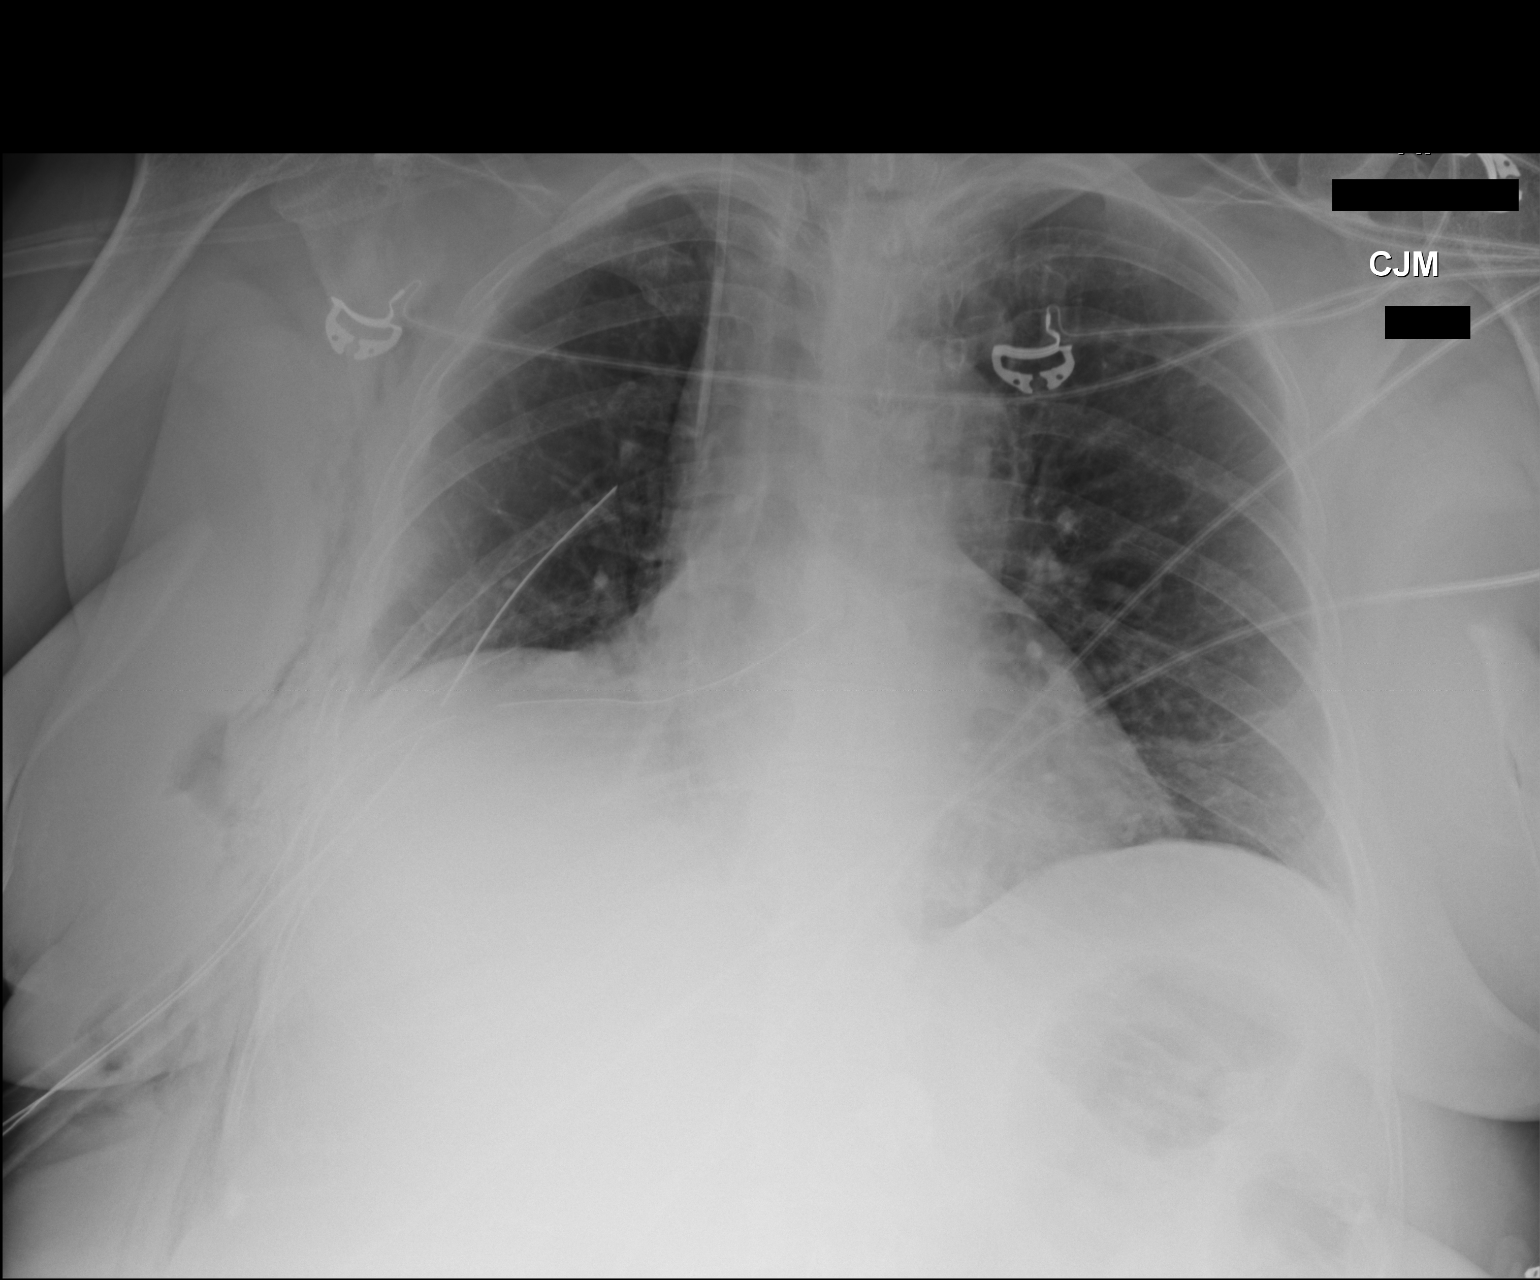

[1 of 1 positions shown; findings below may reference images not displayed]

FINDINGS: Cardiac shadow is stable. A right jugular central line is again
noted. Chest tubes are seen on the right as well as postsurgical
changes in the chest wall. No definitive pneumothorax is seen
minimal atelectasis remains in the left base.
IMPRESSION: No change from the prior exam.

## 2016-08-06 ENCOUNTER — Ambulatory Visit: Payer: Medicare Other

## 2016-08-31 ENCOUNTER — Other Ambulatory Visit: Payer: Self-pay | Admitting: Orthopedic Surgery

## 2016-09-04 ENCOUNTER — Ambulatory Visit
Admission: RE | Admit: 2016-09-04 | Discharge: 2016-09-04 | Disposition: A | Discharge status: Home / Self Care | Payer: Medicare Other | Source: Ambulatory Visit | Attending: Nurse Practitioner | Admitting: Nurse Practitioner

## 2016-09-04 DIAGNOSIS — Z1231 Encounter for screening mammogram for malignant neoplasm of breast: Secondary | ICD-10-CM

## 2016-09-04 HISTORY — DX: Malignant neoplasm of unspecified site of unspecified female breast: C50.919

## 2016-09-05 ENCOUNTER — Telehealth: Payer: Self-pay | Admitting: Internal Medicine

## 2016-09-05 NOTE — Telephone Encounter (Signed)
lmtcb X1 for pt.  Need to schedule pt to a RNA slot prior to 09/27/16

## 2016-09-05 NOTE — Telephone Encounter (Signed)
Spoke with patient. She is scheduled to see CY on 11/06/16. She will need to move this appt up to this month due to her hip replacement surgery being moved up to 09/27/16.   Katie, please advise if we can work this patient in in one of CY's held spots. Thanks.

## 2016-09-05 NOTE — Telephone Encounter (Signed)
Okay to use RNA slot for surgery clearance. Thanks.

## 2016-09-06 NOTE — Telephone Encounter (Signed)
lmtcb x2 for pt to schedule her appointment.

## 2016-09-06 NOTE — Telephone Encounter (Signed)
Scheduled patient with CY on 09/10/16 @ 4:30pm - pt needs nothing further -pr

## 2016-09-10 ENCOUNTER — Ambulatory Visit (INDEPENDENT_AMBULATORY_CARE_PROVIDER_SITE_OTHER): Payer: Medicare Other | Admitting: Internal Medicine

## 2016-09-10 ENCOUNTER — Encounter: Payer: Self-pay | Admitting: Internal Medicine

## 2016-09-10 VITALS — BP 120/56 | HR 63 | Ht 68.0 in | Wt 211.6 lb

## 2016-09-10 DIAGNOSIS — J449 Chronic obstructive pulmonary disease, unspecified: Secondary | ICD-10-CM

## 2016-09-10 DIAGNOSIS — Z23 Encounter for immunization: Secondary | ICD-10-CM | POA: Diagnosis not present

## 2016-09-10 DIAGNOSIS — J8410 Pulmonary fibrosis, unspecified: Secondary | ICD-10-CM | POA: Diagnosis not present

## 2016-09-10 DIAGNOSIS — Z720 Tobacco use: Secondary | ICD-10-CM

## 2016-09-10 MED ORDER — GLYCOPYRROLATE-FORMOTEROL 9-4.8 MCG/ACT IN AERO: 2.0000 | INHALATION_SPRAY | Freq: Two times a day (BID) | RESPIRATORY_TRACT | 0 refills | Status: DC

## 2016-09-10 NOTE — Progress Notes (Signed)
HPI F former heavy smoker , former scrub nurse followed for  COPD, lung nodule RLL/ Carcinoid/right lower lobectomy, 49mm RUL nodule , Hx PE/ IVC filter, complicated by hx discoid lupus rash, hx bilateral breast Ca lumpectomies, old calcified granulomas PFT 05/16/2012: Mild obstructive airways disease with insignificant response to bronchodilator, normal lung volumes, diffusion mildly reduced. FVC 3.08/96%, FEV1 2.01/86%, FEV1/FVC 0.65. TLC 95%, DLCO 73%. Quant TB Gold assay NEG 05/16/12, CT chest 08/13/12- The nodule also shows no evidence (IR did not do needle bx.) of contrast enhancement. Given all of these factors as well as the  presence of multiple abutting blood vessels which may make the  biopsy of higher risk, decision was made to not perform a percutaneous biopsy V/Q in 2016 intermediate for PE- has filter Office Spirometry 09/10/16-severe obstructive airways disease. FVC 1.91/54%, FEV1 1.31/42%, ratio 0.76, FEF 25-75% 0.50/23%. --------------------------------------------------------------------------------------------------------------------  04/06/2015-69 yoF former heavy smoker , former scrub nurse followed for  COPD, lung nodule RLL/ Carcinoid/right lower lobectomy, 63mm RUL nodule , Hx PE/ IVC filter, complicated by hx discoid lupus rash, hx bilateral breast Ca lumpectomies, old calcified granulomas FOLLOWS FOR:  Pt states that she has noticed increased SOB and wheezing with exertion. Pt states that she has 18 steps in her house and finds it very hard to climb them. Pt is planning to move to [place] by car on Saturday 04/09/15.  She has not qualified for home oxygen. On walk test in the office today she did not drop below 92% on room air. Using Brovana neb with some benefit. She didn't even remember that she had DuoNeb. Little cough, little phlegm, no chest pain or palpitation.  09/10/16- 69 yoF former heavy smoker , former scrub nurse followed for  COPD, lung nodule RLL/ Carcinoid/right lower  lobectomy, 14mm RUL nodule , Hx PE/ IVC filter, complicated by hx discoid lupus rash, hx bilateral breast Ca lumpectomies, old calcified granulomas Pt is scheduled to have hip surgery 09/27/16 and is here today for surgical clearance. Pt stated that she went to a pulmonary group in [place] and was taken off a lot of her meds. Pt also states that she started back smoking 08/08/16.    Combivent  Ventolin rescue inhaler  V/Q in 2016 intermediate for PE- has filter/  No longer has nebulizer machine. Hospitalized in [place] last year for COPD exacerbation. Currently denies cough, chest pain, sleep disturbance by breathing. She intends to stop smoking tomorrow and has 14 mg patches to use. Office Spirometry 09/10/16-severe obstructive airways disease. FVC 1.91/54%, FEV1 1.31/42%, ratio 0.76, FEF 25-75% 0.50/23%. // Needs CXR//  ROS-see HPI Constitutional:   No-   weight loss, night sweats, +fevers, chills, fatigue, lassitude. HEENT:   No-  headaches, difficulty swallowing, tooth/dental problems, sore throat,       No-  sneezing, itching, ear ache, nasal congestion, post nasal drip,  CV:  +chest pain, orthopnea, PND, swelling in lower extremities, anasarca,  dizziness, palpitations Resp: +shortness of breath with exertion or at rest.              +cough,  No non-productive cough,  No- coughing up of blood.              No-   change in color of mucus.  + wheezing.   Skin: + rash or lesions. GI:  No-   heartburn, indigestion, abdominal pain, nausea, vomiting,  GU: . MS:  No-   joint pain or swelling. Neuro-     nothing unusual Psych:  No- change in mood or affect. No depression or anxiety.  No memory loss.  OBJ- Physical Exam General- Alert, Oriented, Affect-appropriate, Distress- none acute, +obese,  Skin- + rash dorsum left hand reported discoid lupus Lymphadenopathy- none Head- atraumatic            Eyes- Gross vision intact, PERRLA, conjunctivae and secretions clear            Ears- Hearing,  canals-normal            Nose- Clear, no-Septal dev, mucus, polyps, erosion, perforation             Throat- Mallampati II , mucosa clear , drainage- none, tonsils- atrophic. +Edentulous Neck- flexible , trachea midline, no stridor , thyroid nl, carotid no bruit Chest - symmetrical excursion , unlabored           Heart/CV- RRR , no murmur , no gallop  , no rub, nl s1 s2                           - JVD- none , edema- none, stasis changes- none, varices- none           Lung-  wheeze- none, cough + occasional dry hack , dullness-none, rub- none, +98% saturation at rest           Chest wall-  Abd-  Br/ Gen/ Rectal- Not done, not indicated Extrem- cyanosis- none, clubbing, none, atrophy- none, strength- nl.               + bilateral TKR scars, + crutches Neuro- grossly intact to observation

## 2016-09-10 NOTE — Patient Instructions (Addendum)
Order- spirometry - done  Please quit smoking- I'm glad you have the patches  Sample x 2 Bevespi inhaler     Inhale 2 puffs, twice daily  Ok to continue using either Combivent or Ventolin rescue inhaler up to every 4-6 hours, if needed  Order- Prevnar 13 pneumonia vaccine  Ordder- CXR   Dx COPD mixed type

## 2016-09-11 NOTE — Assessment & Plan Note (Addendum)
Severe obstructive airways disease is currently stable. No acute process needing resolution before planned surgery. I think she's as good as we can get her right now but she will be at increased risk of pulmonary complications during planned surgery cause of her lung disease and comorbidities. Plan-Prevnar 13 vaccine, CXR, sample Bevespi maintenance inhaler for trial

## 2016-09-11 NOTE — Assessment & Plan Note (Signed)
Symptoms did not suggest an active parenchymal infection.

## 2016-09-11 NOTE — Assessment & Plan Note (Signed)
I emphasized the importance of stopping smoking now and permanently. She had quit in 2013 with nicotine patches and has bought patches with stated intention to start using them tomorrow.

## 2016-09-20 ENCOUNTER — Encounter (HOSPITAL_COMMUNITY)
Admission: RE | Admit: 2016-09-20 | Discharge: 2016-09-20 | Disposition: A | Discharge status: Home / Self Care | Payer: Medicare Other | Source: Ambulatory Visit | Attending: Orthopedic Surgery | Admitting: Orthopedic Surgery

## 2016-09-20 ENCOUNTER — Encounter (HOSPITAL_COMMUNITY): Payer: Self-pay

## 2016-09-20 DIAGNOSIS — Z01818 Encounter for other preprocedural examination: Secondary | ICD-10-CM | POA: Insufficient documentation

## 2016-09-20 HISTORY — DX: Unspecified intracranial injury with loss of consciousness of unspecified duration, initial encounter: S06.9X9A

## 2016-09-20 HISTORY — DX: Major depressive disorder, single episode, unspecified: F32.9

## 2016-09-20 HISTORY — DX: Depression, unspecified: F32.A

## 2016-09-20 LAB — URINALYSIS, ROUTINE W REFLEX MICROSCOPIC
BILIRUBIN URINE: NEGATIVE
GLUCOSE, UA: NEGATIVE mg/dL
HGB URINE DIPSTICK: NEGATIVE
KETONES UR: NEGATIVE mg/dL
Leukocytes, UA: NEGATIVE
Nitrite: NEGATIVE
PROTEIN: NEGATIVE mg/dL
Specific Gravity, Urine: 1.015 (ref 1.005–1.030)
pH: 5 (ref 5.0–8.0)

## 2016-09-20 LAB — CBC WITH DIFFERENTIAL/PLATELET
BASOS ABS: 0 10*3/uL (ref 0.0–0.1)
BASOS PCT: 0 %
Eosinophils Absolute: 0.1 10*3/uL (ref 0.0–0.7)
Eosinophils Relative: 1 %
HEMATOCRIT: 38.3 % (ref 36.0–46.0)
HEMOGLOBIN: 12 g/dL (ref 12.0–15.0)
LYMPHS PCT: 21 %
Lymphs Abs: 2.1 10*3/uL (ref 0.7–4.0)
MCH: 27.3 pg (ref 26.0–34.0)
MCHC: 31.3 g/dL (ref 30.0–36.0)
MCV: 87.2 fL (ref 78.0–100.0)
MONO ABS: 0.7 10*3/uL (ref 0.1–1.0)
Monocytes Relative: 7 %
NEUTROS ABS: 6.8 10*3/uL (ref 1.7–7.7)
NEUTROS PCT: 71 %
Platelets: 281 10*3/uL (ref 150–400)
RBC: 4.39 MIL/uL (ref 3.87–5.11)
RDW: 13.3 % (ref 11.5–15.5)
WBC: 9.7 10*3/uL (ref 4.0–10.5)

## 2016-09-20 LAB — BASIC METABOLIC PANEL
ANION GAP: 10 (ref 5–15)
BUN: 18 mg/dL (ref 6–20)
CO2: 23 mmol/L (ref 22–32)
Calcium: 8.9 mg/dL (ref 8.9–10.3)
Chloride: 105 mmol/L (ref 101–111)
Creatinine, Ser: 0.79 mg/dL (ref 0.44–1.00)
GLUCOSE: 88 mg/dL (ref 65–99)
POTASSIUM: 4.3 mmol/L (ref 3.5–5.1)
Sodium: 138 mmol/L (ref 135–145)

## 2016-09-20 LAB — PROTIME-INR
INR: 0.99
Prothrombin Time: 13.1 seconds (ref 11.4–15.2)

## 2016-09-20 LAB — TYPE AND SCREEN
ABO/RH(D): O POS
ANTIBODY SCREEN: NEGATIVE

## 2016-09-20 LAB — APTT: APTT: 30 s (ref 24–36)

## 2016-09-20 NOTE — Progress Notes (Signed)
   09/20/16 1607  OBSTRUCTIVE SLEEP APNEA  Have you ever been diagnosed with sleep apnea through a sleep study? No  Do you snore loudly (loud enough to be heard through closed doors)?  1  Do you often feel tired, fatigued, or sleepy during the daytime (such as falling asleep during driving or talking to someone)? 0  Has anyone observed you stop breathing during your sleep? 1  Do you have, or are you being treated for high blood pressure? 1  BMI more than 35 kg/m2? 1  Age > 50 (1-yes) 1  Neck circumference greater than:Female 16 inches or larger, Female 17inches or larger? 0  Female Gender (Yes=1) 0  Obstructive Sleep Apnea Score 5  Score 5 or greater  Results sent to PCP

## 2016-09-20 NOTE — Pre-Procedure Instructions (Signed)
Breanna Dixon  09/20/2016     Your procedure is scheduled on Wednesday, August 22.  Report to [facility] Admitting at 10:45 AM                Your surgery or procedure is scheduled for 12:45 PM   Call this number if you have problems the morning of surgery: [phone]   Remember:  Do not eat food or drink liquids after midnight Tuesday, August 21.  Take these medicines the morning of surgery with A SIP OF WATER: Brexpiprazole (REXULTI),   carbamazepine (TEGRETOL),   dicyclomine (BENTYL), DULoxetine (CYMBALTA) , hydroxychloroquine (PLAQUENIL),  metoprolol succinate (TOPROL-XL),  pregabalin (LYRICA), ranitidine (ZANTAC).           Use Albuterol or Ipratropium-Albuterol (COMBIVENT RESPIMAT)   inhaler if needed and bring it to the hospital with you.                STOP taking Aspirin , Aspirin Products (Goody Powder, Excedrin Migraine), Ibuprofen (Advil), Naproxen (Aleve), Vitamins and Herbal Products (ie Fish Oil)  Special INstructions:  [place]- Preparing For Surgery  Before surgery, you can play an important role. Because skin is not sterile, your skin needs to be as free of germs as possible. You can reduce the number of germs on your skin by washing with CHG (chlorahexidine gluconate) Soap before surgery.  CHG is an antiseptic cleaner which kills germs and bonds with the skin to continue killing germs even after washing.  Please do not use if you have an allergy to CHG or antibacterial soaps. If your skin becomes reddened/irritated stop using the CHG.  Do not shave (including legs and underarms) for at least 48 hours prior to first CHG shower. It is OK to shave your face.  Please follow these instructions carefully.   1. Shower the NIGHT BEFORE SURGERY and the MORNING OF SURGERY with CHG.   2. If you chose to wash your hair, wash your hair first as usual with your normal shampoo.  3. After you shampoo, rinse your hair and body thoroughly to remove the shampoo.  Wash your face and private area with the soap you use at home, then rinse.  4. Use CHG as you would any other liquid soap. You can apply CHG directly to the skin and wash gently with a scrungie or a clean washcloth.   5. Apply the CHG Soap to your body ONLY FROM THE NECK DOWN.  Do not use on open wounds or open sores. Avoid contact with your eyes, ears, mouth and genitals (private parts). Wash genitals (private parts) with your normal soap.  6. Wash thoroughly, paying special attention to the area where your surgery will be performed.  7. Thoroughly rinse your body with warm water from the neck down.  8. DO NOT shower/wash with your normal soap after using and rinsing off the CHG Soap.  9. Pat yourself dry with a CLEAN TOWEL.   10. Wear CLEAN PAJAMAS   11. Place CLEAN SHEETS on your bed the night of your first shower and DO NOT SLEEP WITH PETS.  Day of Surgery: Shower as above. Do not apply any deodorants/lotions. Please wear clean clothes to the hospital/surgery center.    Do not wear jewelry, make-up or nail polish.  Do not wear lotions, powders, or perfumes.  Do not shave 48 hours prior to surgery.  Men may shave face and neck.  Do not bring valuables to the hospital.  [facility] is not responsible for any belongings or valuables.  Contacts, dentures or bridgework may not be worn into surgery.  Leave your suitcase in the car.  After surgery it may be brought to your room.  For patients admitted to the hospital, discharge time will be determined by your treatment team.  Patients discharged the day of surgery will not be allowed to drive home.   Please read over the following fact sheets that you were given: Pain Booklet, Patient Instructions for Mupirocin Application, Incentive Spirometry, Surgical Site Infections.

## 2016-09-21 DIAGNOSIS — M1612 Unilateral primary osteoarthritis, left hip: Secondary | ICD-10-CM | POA: Diagnosis present

## 2016-09-21 LAB — SURGICAL PCR SCREEN
MRSA, PCR: POSITIVE — AB
Staphylococcus aureus: POSITIVE — AB

## 2016-09-21 NOTE — H&P (Signed)
TOTAL HIP ADMISSION H&P  Patient is admitted for left total hip arthroplasty.  Subjective:  Chief Complaint: left hip pain  HPI: Breanna Dixon, 71 y.o. female, has a history of pain and functional disability in the left hip(s) due to arthritis and patient has failed non-surgical conservative treatments for greater than 12 weeks to include NSAID's and/or analgesics, flexibility and strengthening excercises, use of assistive devices and activity modification.  Onset of symptoms was gradual starting 1 years ago with gradually worsening course since that time.The patient noted no past surgery on the left hip(s).  Patient currently rates pain in the left hip at 10 out of 10 with activity. Patient has night pain, worsening of pain with activity and weight bearing, pain that interfers with activities of daily living and pain with passive range of motion. Patient has evidence of joint subluxation and joint space narrowing by imaging studies. This condition presents safety issues increasing the risk of falls.  There is no current active infection.  Patient Active Problem List   Diagnosis Date Noted  . Right-sided chest wall pain 11/04/2014  . S/P lobectomy of lung-carcinoid 2016 07/15/2014  . Granulomatous lung disease ([place]) 05/25/2012  . Breast mass, left UOQ 01/23/2012  . Acute respiratory failure with hypoxia ([place]) 01/05/2012  . COPD mixed type ([place]) 01/05/2012  . Tobacco abuse 01/05/2012  . Hypertension   . Carcinoid tumor of lung 05/07/2011   Past Medical History:  Diagnosis Date  . Anxiety    takes Ativan daily, Panic attack   . Arthritis   . Bleeding ulcer 1974  . Breast cancer ([place])    right breast 2000  . Cancer [facility])    Surgery only, Lung Cancer- surgery only  . COPD (chronic obstructive pulmonary disease) ([place])   . Depression   . Fibromyalgia    takes Lyrica daily  . Head injury with loss of consciousness ([place])   . Headache(784.0)    takes Tegretol and Verapamil nightly  .  History of blood transfusion    no abnormal reaction noted  . History of bronchitis   . History of colon polyps    benign  . History of kidney stones    several  . History of ulcer disease    pyloric   . Hyperlipidemia    takes Fenofibrate daily  . Hypertension    takes Toprol daily  . Hypertension   . Joint pain   . Lung mass April 2013   Right lower lobe lung mass  . Lupus    takes Cymbalta,Plaquenil  daily  . Pneumonia    hx of  . PTSD (post-traumatic stress disorder)    takes Cymbalta daily  . Pulmonary embolism ([place]) 2006   IVC filter  . Seasonal allergies    flonase prn  . Vertigo    takes Antivert prn    Past Surgical History:  Procedure Laterality Date  . ABDOMINAL HYSTERECTOMY  1992  . APPENDECTOMY  1965  . BACK SURGERY  2000/2010   fusion  . BREAST EXCISIONAL BIOPSY    . BREAST LUMPECTOMY    . BREAST SURGERY     bilateral partial masectomy  . CARDIAC CATHETERIZATION  1992   no PCI  . CHOLECYSTECTOMY  1994  . COLONOSCOPY    . CYSTOSCOPY     multiple  . DILATION AND CURETTAGE OF UTERUS    . HERNIA REPAIR    . INCISIONAL HERNIA REPAIR    . IVC filter    . JOINT  REPLACEMENT Bilateral    Knees  . KNEE ARTHROSCOPY Bilateral   . MULTIPLE TOOTH EXTRACTIONS  1990  . THORACOTOMY/LOBECTOMY Right 07/15/2014   Procedure: RIGHT THORACOTOMY/RIGHT LOWER LOBE  LOBECTOMY;  Surgeon: Gaye Pollack, MD;  Location: [facility] OR;  Service: Thoracic;  Laterality: Right;  . TONSILLECTOMY  1967  . TOTAL KNEE ARTHROPLASTY  06/04/2011   Procedure: TOTAL KNEE ARTHROPLASTY;  Surgeon: Rudean Haskell, MD;  Location: [place];  Service: Orthopedics;  Laterality: Right;  . TOTAL KNEE ARTHROPLASTY  10/15/2011   Procedure: TOTAL KNEE ARTHROPLASTY;  Surgeon: Rudean Haskell, MD;  Location: [place];  Service: Orthopedics;  Laterality: Left;  . [place]  . VENA CAVA FILTER PLACEMENT  2006  . VIDEO BRONCHOSCOPY WITH ENDOBRONCHIAL NAVIGATION N/A 06/02/2014   Procedure: VIDEO  BRONCHOSCOPY WITH ENDOBRONCHIAL NAVIGATION;  Surgeon: Collene Gobble, MD;  Location: [place];  Service: Thoracic;  Laterality: N/A;    No prescriptions prior to admission.   Allergies  Allergen Reactions  . Imitrex [Sumatriptan Base] Nausea And Vomiting    Rapid heart beat.  [name] Ketorolac Tromethamine Nausea And Vomiting    Rapid heart beat.  . Stadol [Butorphanol Tartrate] Nausea And Vomiting  . Ivp Dye [Iodinated Diagnostic Agents] Nausea Only and Palpitations    Can tolerate iodine  . Shellfish Allergy Nausea Only, Swelling and Palpitations    Social History  Substance Use Topics  . Smoking status: Former Smoker    Packs/day: 0.75    Years: 15.00    Types: Cigarettes    Quit date: 09/20/2016  . Smokeless tobacco: Never Used     Comment: pt started back smoking 08/08/16  . Alcohol use No    Family History  Problem Relation Age of Onset  . Adopted: Yes  . Anesthesia problems Neg Hx   . Hypotension Neg Hx   . Malignant hyperthermia Neg Hx   . Pseudochol deficiency Neg Hx      Review of Systems  Constitutional: Positive for chills, fever and malaise/fatigue.  HENT: Positive for hearing loss and tinnitus.   Eyes: Positive for blurred vision.  Respiratory: Positive for shortness of breath.   Cardiovascular:       Htn  Gastrointestinal: Positive for constipation, diarrhea and vomiting.       Ulcers  Genitourinary: Positive for frequency.       Poor bladder control  Musculoskeletal: Positive for joint pain and myalgias.  Neurological: Negative.   Endo/Heme/Allergies: Negative.   Psychiatric/Behavioral: Positive for depression. The patient is nervous/anxious.     Objective:  Physical Exam  Constitutional: She is oriented to person, place, and time. She appears well-developed and well-nourished.  HENT:  Head: Normocephalic and atraumatic.  Eyes: Pupils are equal, round, and reactive to light.  Neck: Normal range of motion. Neck supple.  Cardiovascular: Intact distal  pulses.   Respiratory: Effort normal.  Musculoskeletal: She exhibits tenderness.  Pain with internal rotation of the left hip, foot tap is negative.    Neurological: She is alert and oriented to person, place, and time.  Skin: Skin is warm and dry.  Psychiatric: She has a normal mood and affect. Her behavior is normal. Judgment and thought content normal.    Vital signs in last 24 hours:    Labs:   Estimated body mass index is 32.17 kg/m as calculated from the following:   Height as of 09/10/16: 5\' 8"  (1.727 m).   Weight as of 09/10/16: 96 kg (211 lb 9.6 oz).  Imaging Review Plain radiographs demonstrate AP pelvis and crosstable lateral continue to show end-stage arthritis of the left hip bone-on-bone, mild lateral subluxation of the hip.   Assessment/Plan:  End stage arthritis, left hip(s)  The patient history, physical examination, clinical judgement of the provider and imaging studies are consistent with end stage degenerative joint disease of the left hip(s) and total hip arthroplasty is deemed medically necessary. The treatment options including medical management, injection therapy, arthroscopy and arthroplasty were discussed at length. The risks and benefits of total hip arthroplasty were presented and reviewed. The risks due to aseptic loosening, infection, stiffness, dislocation/subluxation,  thromboembolic complications and other imponderables were discussed.  The patient acknowledged the explanation, agreed to proceed with the plan and consent was signed. Patient is being admitted for inpatient treatment for surgery, pain control, PT, OT, prophylactic antibiotics, VTE prophylaxis, progressive ambulation and ADL's and discharge planning.The patient is planning to be discharged home with home health services.

## 2016-09-21 NOTE — Progress Notes (Signed)
I called a prescription for Mupirocin ointment to [facility], [facility],

## 2016-09-25 MED ORDER — SODIUM CHLORIDE 0.9 % IV SOLN
2000.0000 mg | INTRAVENOUS | Status: AC
  Administered 2016-09-26: 2000 mg via TOPICAL
  Filled 2016-09-25: qty 20

## 2016-09-25 MED ORDER — BUPIVACAINE LIPOSOME 1.3 % IJ SUSP
20.0000 mL | Freq: Once | INTRAMUSCULAR | Status: AC
  Administered 2016-09-26: 20 mL
  Filled 2016-09-25: qty 20

## 2016-09-25 MED ORDER — CEFAZOLIN SODIUM-DEXTROSE 2-4 GM/100ML-% IV SOLN
2.0000 g | INTRAVENOUS | Status: AC
  Administered 2016-09-26: 2 g via INTRAVENOUS
  Filled 2016-09-25: qty 100

## 2016-09-26 ENCOUNTER — Encounter (HOSPITAL_COMMUNITY): Payer: Self-pay | Admitting: Surgery

## 2016-09-26 ENCOUNTER — Inpatient Hospital Stay (HOSPITAL_COMMUNITY)
Admission: RE | Admit: 2016-09-26 | Discharge: 2016-09-28 | DRG: 470 | Disposition: A | Discharge status: Home Health | Payer: Medicare Other | Source: Ambulatory Visit | Attending: Orthopedic Surgery | Admitting: Orthopedic Surgery

## 2016-09-26 ENCOUNTER — Inpatient Hospital Stay (HOSPITAL_COMMUNITY): Payer: Medicare Other | Admitting: Certified Registered"

## 2016-09-26 ENCOUNTER — Inpatient Hospital Stay (HOSPITAL_COMMUNITY): Payer: Medicare Other

## 2016-09-26 ENCOUNTER — Other Ambulatory Visit: Payer: Self-pay | Admitting: Orthopedic Surgery

## 2016-09-26 ENCOUNTER — Encounter (HOSPITAL_COMMUNITY)
Admission: RE | Disposition: A | Discharge status: Home Health | Payer: Self-pay | Source: Ambulatory Visit | Attending: Orthopedic Surgery

## 2016-09-26 DIAGNOSIS — Z86711 Personal history of pulmonary embolism: Secondary | ICD-10-CM

## 2016-09-26 DIAGNOSIS — Z96653 Presence of artificial knee joint, bilateral: Secondary | ICD-10-CM | POA: Diagnosis present

## 2016-09-26 DIAGNOSIS — J449 Chronic obstructive pulmonary disease, unspecified: Secondary | ICD-10-CM | POA: Diagnosis present

## 2016-09-26 DIAGNOSIS — I10 Essential (primary) hypertension: Secondary | ICD-10-CM | POA: Diagnosis present

## 2016-09-26 DIAGNOSIS — Z853 Personal history of malignant neoplasm of breast: Secondary | ICD-10-CM | POA: Diagnosis not present

## 2016-09-26 DIAGNOSIS — M797 Fibromyalgia: Secondary | ICD-10-CM | POA: Diagnosis present

## 2016-09-26 DIAGNOSIS — Z419 Encounter for procedure for purposes other than remedying health state, unspecified: Secondary | ICD-10-CM

## 2016-09-26 DIAGNOSIS — D62 Acute posthemorrhagic anemia: Secondary | ICD-10-CM | POA: Diagnosis not present

## 2016-09-26 DIAGNOSIS — M1612 Unilateral primary osteoarthritis, left hip: Secondary | ICD-10-CM | POA: Diagnosis present

## 2016-09-26 DIAGNOSIS — Z902 Acquired absence of lung [part of]: Secondary | ICD-10-CM | POA: Diagnosis not present

## 2016-09-26 DIAGNOSIS — Z85118 Personal history of other malignant neoplasm of bronchus and lung: Secondary | ICD-10-CM | POA: Diagnosis not present

## 2016-09-26 DIAGNOSIS — Z87891 Personal history of nicotine dependence: Secondary | ICD-10-CM

## 2016-09-26 DIAGNOSIS — F329 Major depressive disorder, single episode, unspecified: Secondary | ICD-10-CM | POA: Diagnosis present

## 2016-09-26 DIAGNOSIS — E785 Hyperlipidemia, unspecified: Secondary | ICD-10-CM | POA: Diagnosis present

## 2016-09-26 HISTORY — PX: TOTAL HIP ARTHROPLASTY: SHX124

## 2016-09-26 SURGERY — ARTHROPLASTY, HIP, TOTAL, ANTERIOR APPROACH
Anesthesia: Spinal | Site: Hip | Laterality: Left

## 2016-09-26 MED ORDER — IPRATROPIUM-ALBUTEROL 0.5-2.5 (3) MG/3ML IN SOLN: 3.0000 mL | Freq: Four times a day (QID) | RESPIRATORY_TRACT | Status: DC | PRN

## 2016-09-26 MED ORDER — PROPOFOL 500 MG/50ML IV EMUL
INTRAVENOUS | Status: DC | PRN
  Administered 2016-09-26: 75 ug/kg/min via INTRAVENOUS

## 2016-09-26 MED ORDER — IPRATROPIUM-ALBUTEROL 20-100 MCG/ACT IN AERS: 2.0000 | INHALATION_SPRAY | Freq: Four times a day (QID) | RESPIRATORY_TRACT | Status: DC | PRN

## 2016-09-26 MED ORDER — METOCLOPRAMIDE HCL 5 MG/ML IJ SOLN: 5.0000 mg | Freq: Three times a day (TID) | INTRAMUSCULAR | Status: DC | PRN

## 2016-09-26 MED ORDER — EPHEDRINE SULFATE 50 MG/ML IJ SOLN
INTRAMUSCULAR | Status: DC | PRN
  Administered 2016-09-26: 15 mg via INTRAVENOUS
  Administered 2016-09-26 (×2): 5 mg via INTRAVENOUS
  Administered 2016-09-26: 10 mg via INTRAVENOUS
  Administered 2016-09-26: 5 mg via INTRAVENOUS
  Administered 2016-09-26: 10 mg via INTRAVENOUS

## 2016-09-26 MED ORDER — ONDANSETRON HCL 4 MG PO TABS: 4.0000 mg | ORAL_TABLET | Freq: Four times a day (QID) | ORAL | Status: DC | PRN

## 2016-09-26 MED ORDER — ONDANSETRON HCL 4 MG/2ML IJ SOLN: 4.0000 mg | Freq: Four times a day (QID) | INTRAMUSCULAR | Status: DC | PRN

## 2016-09-26 MED ORDER — LIDOCAINE HCL (CARDIAC) 20 MG/ML IV SOLN
INTRAVENOUS | Status: DC | PRN
  Administered 2016-09-26: 100 mg via INTRATRACHEAL

## 2016-09-26 MED ORDER — PREGABALIN 100 MG PO CAPS
100.0000 mg | ORAL_CAPSULE | Freq: Every day | ORAL | Status: DC
  Administered 2016-09-27 – 2016-09-28 (×2): 100 mg via ORAL
  Filled 2016-09-26 (×2): qty 1

## 2016-09-26 MED ORDER — APIXABAN 2.5 MG PO TABS: 2.5000 mg | ORAL_TABLET | Freq: Two times a day (BID) | ORAL | Status: DC

## 2016-09-26 MED ORDER — CLONAZEPAM 0.5 MG PO TABS
0.5000 mg | ORAL_TABLET | Freq: Two times a day (BID) | ORAL | Status: DC
  Administered 2016-09-26 – 2016-09-28 (×4): 0.5 mg via ORAL
  Filled 2016-09-26 (×4): qty 1

## 2016-09-26 MED ORDER — NICOTINE 14 MG/24HR TD PT24
14.0000 mg | MEDICATED_PATCH | Freq: Every day | TRANSDERMAL | Status: DC
  Administered 2016-09-26 – 2016-09-27 (×2): 14 mg via TRANSDERMAL
  Filled 2016-09-26 (×3): qty 1

## 2016-09-26 MED ORDER — HYDROMORPHONE HCL 1 MG/ML IJ SOLN
0.5000 mg | INTRAMUSCULAR | Status: DC | PRN
  Administered 2016-09-26 – 2016-09-28 (×10): 0.5 mg via INTRAVENOUS
  Filled 2016-09-26 (×11): qty 1

## 2016-09-26 MED ORDER — FLEET ENEMA 7-19 GM/118ML RE ENEM: 1.0000 | ENEMA | Freq: Once | RECTAL | Status: DC | PRN

## 2016-09-26 MED ORDER — CHLORHEXIDINE GLUCONATE 4 % EX LIQD: 60.0000 mL | Freq: Once | CUTANEOUS | Status: DC

## 2016-09-26 MED ORDER — BREXPIPRAZOLE 1 MG PO TABS
1.0000 mg | ORAL_TABLET | Freq: Every day | ORAL | Status: DC
  Administered 2016-09-27 – 2016-09-28 (×2): 1 mg via ORAL
  Filled 2016-09-26 (×2): qty 1

## 2016-09-26 MED ORDER — QUETIAPINE FUMARATE 50 MG PO TABS
50.0000 mg | ORAL_TABLET | Freq: Every day | ORAL | Status: DC
  Administered 2016-09-26 – 2016-09-27 (×2): 50 mg via ORAL
  Filled 2016-09-26 (×4): qty 1

## 2016-09-26 MED ORDER — DULOXETINE HCL 60 MG PO CPEP
120.0000 mg | ORAL_CAPSULE | Freq: Every day | ORAL | Status: DC
  Administered 2016-09-27: 120 mg via ORAL
  Filled 2016-09-26: qty 2

## 2016-09-26 MED ORDER — FENTANYL CITRATE (PF) 100 MCG/2ML IJ SOLN
INTRAMUSCULAR | Status: AC
  Filled 2016-09-26: qty 2

## 2016-09-26 MED ORDER — FAMOTIDINE 20 MG PO TABS
20.0000 mg | ORAL_TABLET | Freq: Every day | ORAL | Status: DC
  Administered 2016-09-27 – 2016-09-28 (×2): 20 mg via ORAL
  Filled 2016-09-26 (×2): qty 1

## 2016-09-26 MED ORDER — APIXABAN 2.5 MG PO TABS: 2.5000 mg | ORAL_TABLET | Freq: Two times a day (BID) | ORAL | 0 refills | Status: DC

## 2016-09-26 MED ORDER — ALBUTEROL SULFATE (2.5 MG/3ML) 0.083% IN NEBU: 3.0000 mL | INHALATION_SOLUTION | RESPIRATORY_TRACT | Status: DC | PRN

## 2016-09-26 MED ORDER — ACETAMINOPHEN 325 MG PO TABS
650.0000 mg | ORAL_TABLET | Freq: Four times a day (QID) | ORAL | Status: DC | PRN
  Administered 2016-09-27: 650 mg via ORAL
  Filled 2016-09-26 (×2): qty 2

## 2016-09-26 MED ORDER — PHENYLEPHRINE HCL 10 MG/ML IJ SOLN
INTRAVENOUS | Status: DC | PRN
  Administered 2016-09-26: 30 ug/min via INTRAVENOUS

## 2016-09-26 MED ORDER — METHOCARBAMOL 500 MG PO TABS
500.0000 mg | ORAL_TABLET | Freq: Four times a day (QID) | ORAL | Status: DC | PRN
  Administered 2016-09-26 – 2016-09-28 (×5): 500 mg via ORAL
  Filled 2016-09-26 (×5): qty 1

## 2016-09-26 MED ORDER — VERAPAMIL HCL ER 240 MG PO TBCR
240.0000 mg | EXTENDED_RELEASE_TABLET | Freq: Every day | ORAL | Status: DC
Start: 2016-09-26 — End: 2016-09-28
  Administered 2016-09-27: 240 mg via ORAL
  Filled 2016-09-26 (×3): qty 1

## 2016-09-26 MED ORDER — PREGABALIN 100 MG PO CAPS
200.0000 mg | ORAL_CAPSULE | Freq: Every day | ORAL | Status: DC
  Administered 2016-09-26 – 2016-09-27 (×2): 200 mg via ORAL
  Filled 2016-09-26 (×2): qty 2

## 2016-09-26 MED ORDER — CELECOXIB 200 MG PO CAPS
200.0000 mg | ORAL_CAPSULE | Freq: Two times a day (BID) | ORAL | Status: DC
  Administered 2016-09-26 – 2016-09-28 (×4): 200 mg via ORAL
  Filled 2016-09-26 (×4): qty 1

## 2016-09-26 MED ORDER — KCL IN DEXTROSE-NACL 20-5-0.45 MEQ/L-%-% IV SOLN
INTRAVENOUS | Status: DC
  Administered 2016-09-26: 21:00:00 via INTRAVENOUS
  Filled 2016-09-26: qty 1000

## 2016-09-26 MED ORDER — METOPROLOL SUCCINATE ER 100 MG PO TB24
100.0000 mg | ORAL_TABLET | Freq: Every day | ORAL | Status: DC
  Administered 2016-09-27 – 2016-09-28 (×2): 100 mg via ORAL
  Filled 2016-09-26 (×2): qty 1

## 2016-09-26 MED ORDER — CARBAMAZEPINE 200 MG PO TABS
200.0000 mg | ORAL_TABLET | Freq: Every day | ORAL | Status: DC
  Administered 2016-09-27 – 2016-09-28 (×2): 200 mg via ORAL
  Filled 2016-09-26 (×2): qty 1

## 2016-09-26 MED ORDER — METOCLOPRAMIDE HCL 5 MG PO TABS: 5.0000 mg | ORAL_TABLET | Freq: Three times a day (TID) | ORAL | Status: DC | PRN

## 2016-09-26 MED ORDER — POLYETHYLENE GLYCOL 3350 17 G PO PACK: 17.0000 g | PACK | Freq: Every day | ORAL | Status: DC | PRN

## 2016-09-26 MED ORDER — DEXTROSE-NACL 5-0.45 % IV SOLN: INTRAVENOUS | Status: DC

## 2016-09-26 MED ORDER — CARBAMAZEPINE 200 MG PO TABS
400.0000 mg | ORAL_TABLET | Freq: Every day | ORAL | Status: DC
  Administered 2016-09-26 – 2016-09-27 (×2): 400 mg via ORAL
  Filled 2016-09-26 (×2): qty 2

## 2016-09-26 MED ORDER — FENTANYL CITRATE (PF) 100 MCG/2ML IJ SOLN
25.0000 ug | INTRAMUSCULAR | Status: DC | PRN
  Administered 2016-09-26 (×3): 50 ug via INTRAVENOUS

## 2016-09-26 MED ORDER — DICYCLOMINE HCL 20 MG PO TABS
20.0000 mg | ORAL_TABLET | Freq: Two times a day (BID) | ORAL | Status: DC
  Administered 2016-09-26 – 2016-09-28 (×4): 20 mg via ORAL
  Filled 2016-09-26 (×4): qty 1

## 2016-09-26 MED ORDER — OXYCODONE HCL 5 MG PO TABS
5.0000 mg | ORAL_TABLET | ORAL | Status: DC | PRN
  Administered 2016-09-26 – 2016-09-28 (×3): 10 mg via ORAL
  Filled 2016-09-26 (×4): qty 2

## 2016-09-26 MED ORDER — OXYCODONE HCL 5 MG PO TABS
5.0000 mg | ORAL_TABLET | Freq: Once | ORAL | Status: AC | PRN
  Administered 2016-09-26: 5 mg via ORAL

## 2016-09-26 MED ORDER — HYDROXYCHLOROQUINE SULFATE 200 MG PO TABS
200.0000 mg | ORAL_TABLET | Freq: Two times a day (BID) | ORAL | Status: DC
Start: 2016-09-26 — End: 2016-09-28
  Administered 2016-09-26 – 2016-09-28 (×4): 200 mg via ORAL
  Filled 2016-09-26 (×4): qty 1

## 2016-09-26 MED ORDER — EPHEDRINE 5 MG/ML INJ
INTRAVENOUS | Status: AC
  Filled 2016-09-26: qty 10

## 2016-09-26 MED ORDER — DEXAMETHASONE SODIUM PHOSPHATE 10 MG/ML IJ SOLN
10.0000 mg | Freq: Once | INTRAMUSCULAR | Status: AC
  Administered 2016-09-27: 10 mg via INTRAVENOUS
  Filled 2016-09-26: qty 1

## 2016-09-26 MED ORDER — OXYCODONE HCL 5 MG PO TABS
ORAL_TABLET | ORAL | Status: AC
  Filled 2016-09-26: qty 1

## 2016-09-26 MED ORDER — FUROSEMIDE 20 MG PO TABS
20.0000 mg | ORAL_TABLET | Freq: Every day | ORAL | Status: DC
  Administered 2016-09-27 – 2016-09-28 (×2): 20 mg via ORAL
  Filled 2016-09-26 (×2): qty 1

## 2016-09-26 MED ORDER — BISACODYL 5 MG PO TBEC: 5.0000 mg | DELAYED_RELEASE_TABLET | Freq: Every day | ORAL | Status: DC | PRN

## 2016-09-26 MED ORDER — TRANEXAMIC ACID 1000 MG/10ML IV SOLN
1000.0000 mg | INTRAVENOUS | Status: AC
  Administered 2016-09-26: 1000 mg via INTRAVENOUS
  Filled 2016-09-26: qty 10

## 2016-09-26 MED ORDER — TIZANIDINE HCL 2 MG PO TABS: 2.0000 mg | ORAL_TABLET | Freq: Four times a day (QID) | ORAL | 0 refills | Status: DC | PRN

## 2016-09-26 MED ORDER — FENOFIBRATE 160 MG PO TABS
160.0000 mg | ORAL_TABLET | Freq: Every day | ORAL | Status: DC
  Administered 2016-09-26 – 2016-09-28 (×3): 160 mg via ORAL
  Filled 2016-09-26 (×3): qty 1

## 2016-09-26 MED ORDER — ACETAMINOPHEN 650 MG RE SUPP: 650.0000 mg | Freq: Four times a day (QID) | RECTAL | Status: DC | PRN

## 2016-09-26 MED ORDER — OXYCODONE HCL 5 MG/5ML PO SOLN: 5.0000 mg | Freq: Once | ORAL | Status: AC | PRN

## 2016-09-26 MED ORDER — FENTANYL CITRATE (PF) 100 MCG/2ML IJ SOLN
INTRAMUSCULAR | Status: AC
Start: 2016-09-26 — End: 2016-09-27
  Filled 2016-09-26: qty 2

## 2016-09-26 MED ORDER — BUPIVACAINE-EPINEPHRINE (PF) 0.5% -1:200000 IJ SOLN
INTRAMUSCULAR | Status: AC
  Filled 2016-09-26: qty 30

## 2016-09-26 MED ORDER — NON FORMULARY: 2.0000 | Freq: Two times a day (BID) | Status: DC

## 2016-09-26 MED ORDER — PROPOFOL 10 MG/ML IV BOLUS
INTRAVENOUS | Status: DC | PRN
  Administered 2016-09-26: 40 mg via INTRAVENOUS

## 2016-09-26 MED ORDER — PROPOFOL 10 MG/ML IV BOLUS
INTRAVENOUS | Status: AC
  Filled 2016-09-26: qty 20

## 2016-09-26 MED ORDER — MIDAZOLAM HCL 2 MG/2ML IJ SOLN
INTRAMUSCULAR | Status: AC
  Filled 2016-09-26: qty 2

## 2016-09-26 MED ORDER — LIDOCAINE 2% (20 MG/ML) 5 ML SYRINGE
INTRAMUSCULAR | Status: AC
  Filled 2016-09-26: qty 5

## 2016-09-26 MED ORDER — PHENOL 1.4 % MT LIQD: 1.0000 | OROMUCOSAL | Status: DC | PRN

## 2016-09-26 MED ORDER — FENTANYL CITRATE (PF) 250 MCG/5ML IJ SOLN
INTRAMUSCULAR | Status: DC | PRN
  Administered 2016-09-26: 100 ug via INTRAVENOUS
  Administered 2016-09-26 (×2): 50 ug via INTRAVENOUS

## 2016-09-26 MED ORDER — FENTANYL CITRATE (PF) 250 MCG/5ML IJ SOLN
INTRAMUSCULAR | Status: AC
  Filled 2016-09-26: qty 5

## 2016-09-26 MED ORDER — MENTHOL 3 MG MT LOZG: 1.0000 | LOZENGE | OROMUCOSAL | Status: DC | PRN

## 2016-09-26 MED ORDER — 0.9 % SODIUM CHLORIDE (POUR BTL) OPTIME
TOPICAL | Status: DC | PRN
  Administered 2016-09-26: 1000 mL

## 2016-09-26 MED ORDER — CARBAMAZEPINE 200 MG PO TABS: 200.0000 mg | ORAL_TABLET | Freq: Two times a day (BID) | ORAL | Status: DC

## 2016-09-26 MED ORDER — OXYCODONE-ACETAMINOPHEN 5-325 MG PO TABS: 1.0000 | ORAL_TABLET | ORAL | 0 refills | Status: DC | PRN

## 2016-09-26 MED ORDER — LACTATED RINGERS IV SOLN
INTRAVENOUS | Status: DC
  Administered 2016-09-26 (×3): via INTRAVENOUS

## 2016-09-26 MED ORDER — BUPIVACAINE-EPINEPHRINE (PF) 0.5% -1:200000 IJ SOLN
INTRAMUSCULAR | Status: DC | PRN
  Administered 2016-09-26: 50 mL

## 2016-09-26 MED ORDER — METHOCARBAMOL 1000 MG/10ML IJ SOLN
500.0000 mg | Freq: Four times a day (QID) | INTRAVENOUS | Status: DC | PRN
  Filled 2016-09-26: qty 5

## 2016-09-26 MED ORDER — DOCUSATE SODIUM 100 MG PO CAPS
100.0000 mg | ORAL_CAPSULE | Freq: Two times a day (BID) | ORAL | Status: DC
  Administered 2016-09-26 – 2016-09-28 (×4): 100 mg via ORAL
  Filled 2016-09-26 (×4): qty 1

## 2016-09-26 MED ORDER — MIDAZOLAM HCL 2 MG/2ML IJ SOLN
INTRAMUSCULAR | Status: DC | PRN
  Administered 2016-09-26: 2 mg via INTRAVENOUS

## 2016-09-26 MED ORDER — TRANEXAMIC ACID 1000 MG/10ML IV SOLN
1000.0000 mg | Freq: Once | INTRAVENOUS | Status: AC
  Administered 2016-09-26: 1000 mg via INTRAVENOUS
  Filled 2016-09-26: qty 10

## 2016-09-26 MED ORDER — PHENYLEPHRINE HCL 10 MG/ML IJ SOLN
INTRAMUSCULAR | Status: DC | PRN
  Administered 2016-09-26: 80 ug via INTRAVENOUS
  Administered 2016-09-26: 40 ug via INTRAVENOUS

## 2016-09-26 MED ORDER — SODIUM CHLORIDE 0.9 % IJ SOLN
INTRAMUSCULAR | Status: DC | PRN
  Administered 2016-09-26: 20 mL

## 2016-09-26 MED ORDER — ALUMINUM HYDROXIDE GEL 320 MG/5ML PO SUSP
15.0000 mL | ORAL | Status: DC | PRN
  Filled 2016-09-26: qty 30

## 2016-09-26 MED ORDER — PREGABALIN 100 MG PO CAPS: 100.0000 mg | ORAL_CAPSULE | Freq: Two times a day (BID) | ORAL | Status: DC

## 2016-09-26 MED ORDER — DIPHENHYDRAMINE HCL 12.5 MG/5ML PO ELIX: 12.5000 mg | ORAL_SOLUTION | ORAL | Status: DC | PRN

## 2016-09-26 MED ORDER — DIPHENOXYLATE-ATROPINE 2.5-0.025 MG PO TABS: 1.0000 | ORAL_TABLET | Freq: Four times a day (QID) | ORAL | Status: DC | PRN

## 2016-09-26 SURGICAL SUPPLY — 46 items
BAG DECANTER FOR FLEXI CONT (MISCELLANEOUS) ×3 IMPLANT
BLADE SAW SGTL 18X1.27X75 (BLADE) ×2 IMPLANT
BLADE SAW SGTL 18X1.27X75MM (BLADE) ×1
CAPT HIP TOTAL 2 ×3 IMPLANT
COVER PERINEAL POST (MISCELLANEOUS) ×3 IMPLANT
COVER SURGICAL LIGHT HANDLE (MISCELLANEOUS) ×3 IMPLANT
DRAPE ARTHROSCOPY W/POUCH 114 (DRAPES) IMPLANT
DRAPE C-ARM 42X72 X-RAY (DRAPES) ×3 IMPLANT
DRAPE STERI IOBAN 125X83 (DRAPES) ×3 IMPLANT
DRAPE U-SHAPE 47X51 STRL (DRAPES) ×6 IMPLANT
DRSG AQUACEL AG ADV 3.5X10 (GAUZE/BANDAGES/DRESSINGS) ×3 IMPLANT
DURAPREP 26ML APPLICATOR (WOUND CARE) ×3 IMPLANT
ELECT BLADE 4.0 EZ CLEAN MEGAD (MISCELLANEOUS) ×3
ELECT REM PT RETURN 9FT ADLT (ELECTROSURGICAL) ×3
ELECTRODE BLDE 4.0 EZ CLN MEGD (MISCELLANEOUS) ×1 IMPLANT
ELECTRODE REM PT RTRN 9FT ADLT (ELECTROSURGICAL) ×1 IMPLANT
FACESHIELD WRAPAROUND (MASK) ×6 IMPLANT
GLOVE BIO SURGEON STRL SZ7.5 (GLOVE) ×3 IMPLANT
GLOVE BIO SURGEON STRL SZ8.5 (GLOVE) ×3 IMPLANT
GLOVE BIOGEL PI IND STRL 8 (GLOVE) ×1 IMPLANT
GLOVE BIOGEL PI IND STRL 9 (GLOVE) ×1 IMPLANT
GLOVE BIOGEL PI INDICATOR 8 (GLOVE) ×2
GLOVE BIOGEL PI INDICATOR 9 (GLOVE) ×2
GOWN STRL REUS W/ TWL LRG LVL3 (GOWN DISPOSABLE) ×1 IMPLANT
GOWN STRL REUS W/ TWL XL LVL3 (GOWN DISPOSABLE) ×2 IMPLANT
GOWN STRL REUS W/TWL LRG LVL3 (GOWN DISPOSABLE) ×2
GOWN STRL REUS W/TWL XL LVL3 (GOWN DISPOSABLE) ×4
KIT BASIN OR (CUSTOM PROCEDURE TRAY) ×3 IMPLANT
KIT ROOM TURNOVER OR (KITS) ×3 IMPLANT
MANIFOLD NEPTUNE II (INSTRUMENTS) ×3 IMPLANT
NEEDLE HYPO 22GX1.5 SAFETY (NEEDLE) ×6 IMPLANT
NS IRRIG 1000ML POUR BTL (IV SOLUTION) ×3 IMPLANT
PACK TOTAL JOINT (CUSTOM PROCEDURE TRAY) ×3 IMPLANT
PAD ARMBOARD 7.5X6 YLW CONV (MISCELLANEOUS) ×6 IMPLANT
SUT ETHIBOND NAB CT1 #1 30IN (SUTURE) ×3 IMPLANT
SUT VIC AB 1 CTX 36 (SUTURE) ×2
SUT VIC AB 1 CTX36XBRD ANBCTR (SUTURE) ×1 IMPLANT
SUT VIC AB 2-0 CT1 27 (SUTURE) ×2
SUT VIC AB 2-0 CT1 TAPERPNT 27 (SUTURE) ×1 IMPLANT
SUT VIC AB 3-0 PS2 18 (SUTURE) ×2
SUT VIC AB 3-0 PS2 18XBRD (SUTURE) ×1 IMPLANT
SYR CONTROL 10ML LL (SYRINGE) ×6 IMPLANT
TOWEL OR 17X24 6PK STRL BLUE (TOWEL DISPOSABLE) IMPLANT
TOWEL OR 17X26 10 PK STRL BLUE (TOWEL DISPOSABLE) IMPLANT
TRAY CATH 16FR W/PLASTIC CATH (SET/KITS/TRAYS/PACK) IMPLANT
TRAY FOLEY W/METER SILVER 14FR (SET/KITS/TRAYS/PACK) ×3 IMPLANT

## 2016-09-26 NOTE — Discharge Instructions (Signed)

## 2016-09-26 NOTE — Anesthesia Procedure Notes (Signed)
Spinal  Patient location during procedure: OR Start time: 09/26/2016 1:25 PM End time: 09/26/2016 1:40 PM Staffing Anesthesiologist: Neosha Switalski Performed: anesthesiologist  Preanesthetic Checklist Completed: patient identified, site marked, surgical consent, pre-op evaluation, timeout performed, IV checked, risks and benefits discussed and monitors and equipment checked Spinal Block Patient position: sitting Prep: ChloraPrep and site prepped and draped Patient monitoring: heart rate, cardiac monitor, continuous pulse ox and blood pressure Approach: midline Location: L3-4 Injection technique: single-shot Needle Needle type: Quincke  Needle gauge: 25 G Needle length: 9 cm Needle insertion depth: 4 cm Assessment Sensory level: T6

## 2016-09-26 NOTE — Interval H&P Note (Signed)
History and Physical Interval Note:  09/26/2016 12:34 PM  Breanna Dixon  has presented today for surgery, with the diagnosis of LEFT HIP OSTEOARTHRITIS  The various methods of treatment have been discussed with the patient and family. After consideration of risks, benefits and other options for treatment, the patient has consented to  Procedure(s): TOTAL HIP ARTHROPLASTY ANTERIOR APPROACH (Left) as a surgical intervention .  The patient's history has been reviewed, patient examined, no change in status, stable for surgery.  I have reviewed the patient's chart and labs.  Questions were answered to the patient's satisfaction.     Kerin Salen

## 2016-09-26 NOTE — Anesthesia Preprocedure Evaluation (Addendum)
Anesthesia Evaluation  Patient identified by MRN, date of birth, ID band Patient awake    Reviewed: Allergy & Precautions, H&P , NPO status , Patient's Chart, lab work & pertinent test results  Airway Mallampati: II   Neck ROM: full    Dental no notable dental hx.    Pulmonary COPD, former smoker, PE Pt has IVC filter for h/o PE   breath sounds clear to auscultation       Cardiovascular hypertension,  Rhythm:regular Rate:Normal     Neuro/Psych  Headaches, PSYCHIATRIC DISORDERS Anxiety Depression PTSD   GI/Hepatic PUD,   Endo/Other    Renal/GU stones     Musculoskeletal  (+) Arthritis , Fibromyalgia -  Abdominal   Peds  Hematology   Anesthesia Other Findings   Reproductive/Obstetrics                            Anesthesia Physical Anesthesia Plan  ASA: III  Anesthesia Plan: Spinal   Post-op Pain Management:    Induction: Intravenous  PONV Risk Score and Plan: 2 and Ondansetron, Dexamethasone, Treatment may vary due to age or medical condition and Propofol infusion  Airway Management Planned: Simple Face Mask  Additional Equipment:   Intra-op Plan:   Post-operative Plan:   Informed Consent: I have reviewed the patients History and Physical, chart, labs and discussed the procedure including the risks, benefits and alternatives for the proposed anesthesia with the patient or authorized representative who has indicated his/her understanding and acceptance.     Plan Discussed with: CRNA, Anesthesiologist and Surgeon  Anesthesia Plan Comments:         Anesthesia Quick Evaluation

## 2016-09-26 NOTE — Anesthesia Procedure Notes (Signed)
Procedure Name: MAC Date/Time: 09/26/2016 1:27 PM Performed by: Lance Coon Pre-anesthesia Checklist: Patient identified, Emergency Drugs available, Suction available, Timeout performed and Patient being monitored Patient Re-evaluated:Patient Re-evaluated prior to induction Oxygen Delivery Method: Nasal cannula

## 2016-09-26 NOTE — Transfer of Care (Signed)
Immediate Anesthesia Transfer of Care Note  Patient: Bryana A Martinique  Procedure(s) Performed: Procedure(s): TOTAL HIP ARTHROPLASTY ANTERIOR APPROACH (Left)  Patient Location: PACU  Anesthesia Type:MAC and Spinal  Level of Consciousness: awake, alert , oriented and patient cooperative  Airway & Oxygen Therapy: Patient Spontanous Breathing  Post-op Assessment: Report given to RN and Post -op Vital signs reviewed and stable  Post vital signs: Reviewed and stable  Last Vitals:  Vitals:   09/26/16 1128  Pulse: 64  Resp: 18  Temp: 37.2 C  SpO2: 98%    Last Pain:  Vitals:   09/26/16 1132  TempSrc:   PainSc: 5       Patients Stated Pain Goal: 5 (07/29/74 2831)  Complications: No apparent anesthesia complications

## 2016-09-26 NOTE — Op Note (Signed)
OPERATIVE REPORT    DATE OF PROCEDURE:  09/26/2016       PREOPERATIVE DIAGNOSIS:  LEFT HIP OSTEOARTHRITIS                                                          POSTOPERATIVE DIAGNOSIS:  LEFT HIP OSTEOARTHRITIS                                                           PROCEDURE: Anterior L total hip arthroplasty using a 50 mm DePuy Pinnacle  Cup, [employer], 0-degree polyethylene liner, a +5 32 mm ceramic head, a 3 Hi Depuy Triloc stem   SURGEON: Kianni Lheureux J    ASSISTANT:   [employer]  (present throughout entire procedure and necessary for timely completion of the procedure)   ANESTHESIA: Spinal BLOOD LOSS: 300cc FLUID REPLACEMENT: 1500 crystalloid Antibiotic: 2 gm Ancef Tranexamic Acid: 1gm IV, 2gm Topical COMPLICATIONS: none    INDICATIONS FOR PROCEDURE: A 71 y.o. year-old With  LEFT HIP OSTEOARTHRITIS   for 3 years, x-rays show bone-on-bone arthritic changes, and osteophytes. Despite conservative measures with observation, anti-inflammatory medicine, narcotics, use of a cane, has severe unremitting pain and can ambulate only a few blocks before resting. Patient desires elective  L total hip arthroplasty to decrease pain and increase function. The risks, benefits, and alternatives were discussed at length including but not limited to the risks of infection, bleeding, nerve injury, stiffness, blood clots, the need for revision surgery, cardiopulmonary complications, among others, and they were willing to proceed. Questions answered     PROCEDURE IN DETAIL: The patient was identified by armband,  received preoperative IV antibiotics in the holding area at [facility], taken to the operating room , appropriate anesthetic monitors  were attached and  anesthesia was induced with the patienton the gurney. The HANA boots were applied to the feet and he was then transferred to the HANA table with a peroneal post and support underneath the non-operative  le, which was locked in 5 lb traction. Theoperative lower extremity was then prepped and draped in the usual sterile fashion from just above the iliac crest to the knee. And a timeout procedure was performed. We then made a 13 cm incision along the interval at the leading edge of the tensor fascia lata of starting at 2 cm lateral to and 2 cm distal to the ASIS. Small bleeders in the skin and subcutaneous tissue identified and cauterized we dissected down to the fascia and made an incision in the fascia allowing [place] to elevate the fascia of the tensor muscle and exploited the interval between the rectus and the tensor fascia lata. A Hohmann retractor was then placed along the superior neck of the femur and a Cobra retractor along the inferior neck of the femur we teed the capsule starting out at the superior anterior aspect of the acetabulum going distally and made the T along the neck both leaflets of the T were tagged with #2 Ethibond suture. Cobra retractors were then placed along the inferior and superior neck allowing [place] to perform a standard neck  cut and removed the femoral head with a power corkscrew. We then placed a right angle Hohmann retractor along the anterior aspect of the acetabulum a spiked Cobra in the cotyloid notch and posteriorly a Muelller retractor. We then sequentially reamed up to a 49 mm basket reamer obtaining good coverage in all quadrants, verified by C-arm imaging. Under C-arm control with and hammered into place a 50 mm Pinnacle cup in 45 of abduction and 15 of anteversion. The cup seated nicely and required no supplemental screws. We then placed a central hole Eliminator and a 0 polyethylene liner. The foot was then externally rotated to 110, the HANA elevator was placed around the flare of the greater trochanter and the limb was extended and abducted delivering the proximal femur up into the wound. A medium Hohmann retractor was placed over the greater trochanter and a Mueller  retractor along the posterior femoral neck completing the exposure. We then performed releases superiorly and and inferiorly of the capsule going back to the pirformis fossa superiorly and to the lesser trochanter inferiorly. We then entered the proximal femur with the box cutting offset chisel followed by, a canal sounder, the chili pepper and broaching up to a 3 Hi broach. This seated nicely and we reamed the calcar. A trial reduction was performed with a 5 mm 32 mm head.The limb lengths were excellent the hip was stable in 90 of external rotation. At this point the trial components removed and we hammered into place a # 3 Hi  Offset Tri-Lock stem with Gryption coating. A + 5 32 mm ceramic ball was then hammered into place the hip was reduced and final C-arm images obtained. The wound was thoroughly irrigated with normal saline solution. We repaired the ant capsule and the tensor fascia lot a with running 0 vicryl suture. the subcutaneous tissue was closed with 2-0 and 3-0 Vicryl suture followed by an Aquacil dressing. At this point the patient was awaken and transferred to hospital gurney without difficulty. The subcutaneous tissue with 0 and 2-0 undyed Vicryl suture and the skin with running  3-0 vicryl subcuticular suture. Aquacil dressing was applied. The patient was then unclamped, rolled supine, awaken extubated and taken to recovery room without difficulty in stable condition.   Frederik Pear J 09/26/2016, 2:48 PM

## 2016-09-27 ENCOUNTER — Encounter (HOSPITAL_COMMUNITY): Payer: Self-pay | Admitting: Orthopedic Surgery

## 2016-09-27 LAB — BASIC METABOLIC PANEL
Anion gap: 6 (ref 5–15)
BUN: 14 mg/dL (ref 6–20)
CALCIUM: 8.5 mg/dL — AB (ref 8.9–10.3)
CO2: 24 mmol/L (ref 22–32)
CREATININE: 0.7 mg/dL (ref 0.44–1.00)
Chloride: 105 mmol/L (ref 101–111)
GFR calc non Af Amer: >60 mL/min (ref >60)
Glucose, Bld: 130 mg/dL — ABNORMAL HIGH (ref 65–99)
Potassium: 4.2 mmol/L (ref 3.5–5.1)
Sodium: 135 mmol/L (ref 135–145)

## 2016-09-27 LAB — GLUCOSE, CAPILLARY: Glucose-Capillary: 187 mg/dL — ABNORMAL HIGH (ref 65–99)

## 2016-09-27 LAB — CBC
HEMATOCRIT: 33.3 % — AB (ref 36.0–46.0)
Hemoglobin: 10.3 g/dL — ABNORMAL LOW (ref 12.0–15.0)
MCH: 27.2 pg (ref 26.0–34.0)
MCHC: 30.9 g/dL (ref 30.0–36.0)
MCV: 88.1 fL (ref 78.0–100.0)
Platelets: 219 10*3/uL (ref 150–400)
RBC: 3.78 MIL/uL — ABNORMAL LOW (ref 3.87–5.11)
RDW: 13.2 % (ref 11.5–15.5)
WBC: 9.9 10*3/uL (ref 4.0–10.5)

## 2016-09-27 MED ORDER — ENOXAPARIN SODIUM 30 MG/0.3ML ~~LOC~~ SOLN
30.0000 mg | Freq: Two times a day (BID) | SUBCUTANEOUS | Status: DC
  Administered 2016-09-27 – 2016-09-28 (×2): 30 mg via SUBCUTANEOUS
  Filled 2016-09-27 (×2): qty 0.3

## 2016-09-27 NOTE — Progress Notes (Signed)
Physical Therapy Treatment Patient Details Name: Breanna Dixon MRN: [id number] DOB: Feb 02, 1946 Today's Date: 09/27/2016    History of Present Illness Patient is a 71 y/o female admitted for L anterior THA.  She has history of multiple medical issues including but not limited to lung ca, breast ca, COPD, lupus, prior TKA R&L, HTN, fibromyalgia and head injury.     PT Comments    Patient progressing with bed mobility and transfers.  Limited ambulation due to fatigue from up in chair all day.  Able to complete THA exercises and reviewed Ant precautions.  Feel continued skilled PT indicated prior to d/c home with family support and follow up HHPT.   Follow Up Recommendations  Home health PT;DC plan and follow up therapy as arranged by surgeon     Equipment Recommendations  Rolling walker with 5" wheels;3in1 (PT) (in room already)    Recommendations for Other Services       Precautions / Restrictions Precautions Precautions: Anterior Hip Restrictions Weight Bearing Restrictions: Yes LLE Weight Bearing: Weight bearing as tolerated    Mobility  Bed Mobility Overal bed mobility: Needs Assistance Bed Mobility: Supine to Sit;Sit to Supine     Supine to sit: Min guard Sit to supine: Min assist   General bed mobility comments: initially assist for L LE, but pt helping with R LE, then to supine assist for L LE  Transfers Overall transfer level: Needs assistance Equipment used: Rolling walker (2 wheeled) Transfers: Sit to/from Stand Sit to Stand: Supervision         General transfer comment: cues for hand placement for safety  Ambulation/Gait Ambulation/Gait assistance: Supervision;Min guard Ambulation Distance (Feet): 12 Feet (x 2) Assistive device: Rolling walker (2 wheeled) Gait Pattern/deviations: Step-to pattern;Step-through pattern;Decreased stride length;Antalgic     General Gait Details: to and from bathroom only due to just back to bed prior to  session   Stairs            Wheelchair Mobility    Modified Rankin (Stroke Patients Only)       Balance Overall balance assessment: Needs assistance   Sitting balance-Leahy Scale: Good     Standing balance support: No upper extremity supported Standing balance-Leahy Scale: Fair Standing balance comment: washing hands in sink                            Cognition Arousal/Alertness: Awake/alert Behavior During Therapy: WFL for tasks assessed/performed Overall Cognitive Status: Within Functional Limits for tasks assessed                                 General Comments: grossly WFL, but forgets what she is saying mid sentence, was drifting off in supine, possibly due to meds      Exercises Total Joint Exercises Ankle Circles/Pumps: AROM;Both;10 reps;Supine Quad Sets: AROM;Both;10 reps;Supine Short Arc Quad: AROM;Left;10 reps;Supine Heel Slides: AAROM;Left;10 reps;Supine    General Comments General comments (skin integrity, edema, etc.): toileted and performed hygiene unaided      Pertinent Vitals/Pain Pain Assessment: Faces Faces Pain Scale: Hurts little more Pain Location: L hip Pain Descriptors / Indicators: Aching;Sore Pain Intervention(s): Monitored during session;Repositioned;Ice applied    Home Living                      Prior Function  PT Goals (current goals can now be found in the care plan section) Acute Rehab PT Goals Patient Stated Goal: To return to independent PT Goal Formulation: With patient Time For Goal Achievement: 10/01/16 Potential to Achieve Goals: Good Progress towards PT goals: Progressing toward goals    Frequency    7X/week      PT Plan Current plan remains appropriate    Co-evaluation              AM-PAC PT "6 Clicks" Daily Activity  Outcome Measure  Difficulty turning over in bed (including adjusting bedclothes, sheets and blankets)?: A Little Difficulty  moving from lying on back to sitting on the side of the bed? : A Lot Difficulty sitting down on and standing up from a chair with arms (e.g., wheelchair, bedside commode, etc,.)?: A Little Help needed moving to and from a bed to chair (including a wheelchair)?: A Little Help needed walking in hospital room?: A Little Help needed climbing 3-5 steps with a railing? : A Little 6 Click Score: 17    End of Session Equipment Utilized During Treatment: Gait belt Activity Tolerance: Patient tolerated treatment well Patient left: in bed;with call bell/phone within reach   PT Visit Diagnosis: Other abnormalities of gait and mobility (R26.89);Pain Pain - Right/Left: Left Pain - part of body: Hip     Time: 4235-3614 PT Time Calculation (min) (ACUTE ONLY): 25 min  Charges:  $Gait Training: 8-22 mins $Therapeutic Exercise: 8-22 mins                    G CodesMagda Dixon, [place] [phone] 09/27/2016    Breanna Dixon 09/27/2016, 4:42 PM

## 2016-09-27 NOTE — Evaluation (Signed)
Physical Therapy Evaluation Patient Details Name: Breanna Dixon MRN: [id number] DOB: 03-23-1945 Today's Date: 09/27/2016   History of Present Illness  Patient is a 71 y/o female admitted for L anterior THA.  She has history of multiple medical issues including but not limited to lung ca, breast ca, COPD, lupus, prior TKA R&L, HTN, fibromyalgia and head injury.   Clinical Impression  Patient presents with decreased independence with mobility due to deficits listed in PT problem list.  She will benefit from skilled PT in the acute setting to allow return home with family/friend assist and follow up HHPT.    Follow Up Recommendations Home health PT;DC plan and follow up therapy as arranged by surgeon    Equipment Recommendations  Rolling walker with 5" wheels;3in1 (PT) (already delivered to room)    Recommendations for Other Services       Precautions / Restrictions Precautions Precautions: Anterior Hip Restrictions Weight Bearing Restrictions: Yes LLE Weight Bearing: Weight bearing as tolerated      Mobility  Bed Mobility Overal bed mobility: Needs Assistance Bed Mobility: Supine to Sit     Supine to sit: Min assist     General bed mobility comments: initially helped with L LE, but pt used R to assist; cues for technique  Transfers Overall transfer level: Needs assistance Equipment used: Rolling walker (2 wheeled) Transfers: Sit to/from Stand Sit to Stand: Supervision         General transfer comment: pt initially pulled up on walker unaided without A initially due to impulsivity  Ambulation/Gait Ambulation/Gait assistance: Min guard Ambulation Distance (Feet): 120 Feet Assistive device: Rolling walker (2 wheeled) Gait Pattern/deviations: Step-to pattern;Step-through pattern;Decreased stride length;Antalgic     General Gait Details: cues for sequence and walker position  Stairs            Wheelchair Mobility    Modified Rankin (Stroke Patients  Only)       Balance Overall balance assessment: Needs assistance   Sitting balance-Leahy Scale: Good     Standing balance support: No upper extremity supported Standing balance-Leahy Scale: Fair Standing balance comment: stood in hall while cleaning hands with sanitizer                             Pertinent Vitals/Pain Pain Assessment: (P) 0-10 Pain Location: L hip Pain Descriptors / Indicators: Aching;Sore Pain Intervention(s): Monitored during session;Repositioned    Home Living Family/patient expects to be discharged to:: Private residence Living Arrangements: Children Available Help at Discharge: Family;Friend(s) Type of Home: Other(Comment) (condo) Home Access: Stairs to enter   Entrance Stairs-Number of Steps: 4 Home Layout: Two level;Bed/bath upstairs Home Equipment: Clinical cytogeneticist - 2 wheels;Cane - single point;Crutches Additional Comments: lives with son and roomate    Prior Function Level of Independence: Independent with assistive device(s)         Comments: used crutches; pt drives     Hand Dominance   Dominant Hand: Left    Extremity/Trunk Assessment   Upper Extremity Assessment Upper Extremity Assessment: Generalized weakness    Lower Extremity Assessment Lower Extremity Assessment: LLE deficits/detail LLE Deficits / Details: AAROM limited by pain about 70 degrees hip flexion, assists L with R for OOB       Communication   Communication: No difficulties  Cognition Arousal/Alertness: Awake/alert Behavior During Therapy: Impulsive Overall Cognitive Status: No family/caregiver present to determine baseline cognitive functioning  General Comments: grossly WFL, but forgets what she is saying mid sentence, was drifting off in supine, possibly due to meds      General Comments      Exercises Total Joint Exercises Ankle Circles/Pumps: AROM;Both;10 reps;Supine   Assessment/Plan     PT Assessment Patient needs continued PT services  PT Problem List Decreased mobility;Decreased strength;Decreased activity tolerance;Decreased balance;Pain;Decreased knowledge of use of DME;Decreased knowledge of precautions       PT Treatment Interventions DME instruction;Gait training;Stair training;Balance training;Functional mobility training;Therapeutic exercise;Patient/family education;Therapeutic activities    PT Goals (Current goals can be found in the Care Plan section)  Acute Rehab PT Goals Patient Stated Goal: To return to independent PT Goal Formulation: With patient Time For Goal Achievement: 10/01/16 Potential to Achieve Goals: Good    Frequency 7X/week   Barriers to discharge        Co-evaluation               AM-PAC PT "6 Clicks" Daily Activity  Outcome Measure Difficulty turning over in bed (including adjusting bedclothes, sheets and blankets)?: A Little Difficulty moving from lying on back to sitting on the side of the bed? : Unable Difficulty sitting down on and standing up from a chair with arms (e.g., wheelchair, bedside commode, etc,.)?: Unable Help needed moving to and from a bed to chair (including a wheelchair)?: A Little Help needed walking in hospital room?: A Little Help needed climbing 3-5 steps with a railing? : A Little 6 Click Score: 14    End of Session Equipment Utilized During Treatment: Gait belt   Patient left: in chair;with call bell/phone within reach   PT Visit Diagnosis: Other abnormalities of gait and mobility (R26.89);Pain Pain - Right/Left: Left Pain - part of body: Hip    Time: 8381-8403 PT Time Calculation (min) (ACUTE ONLY): 37 min   Charges:   PT Evaluation $PT Eval Moderate Complexity: 1 Mod PT Treatments $Gait Training: 8-22 mins   PT G CodesMagda Kiel, [place] [phone] 09/27/2016   Reginia Naas 09/27/2016, 2:13 PM

## 2016-09-27 NOTE — Care Management Note (Signed)
Case Management Note  Patient Details  Name: Breanna Dixon MRN: [id number] Date of Birth: 07/29/1945  Subjective/Objective:      71 yr old female s/p left total hip arthroplasty.               Action/Plan:  Case manager spoke with patient concerning discharge plan and DME needs. Patient was preoperatively setup with [facility], no changes. CM has ordered RW and 3in1. She will have family support at discharge.     Expected Discharge Date:   09/28/16               Expected Discharge Plan:  [place]  In-House Referral:  NA  Discharge planning Services  CM Consult  Post Acute Care Choice:  Home Health, Durable Medical Equipment Choice offered to:  Patient  DME Arranged:  3-N-1, Walker rolling DME Agency:  [place]:  PT [place]:  [facility] (formerly [facility])  Status of Service:  Completed, signed off  If discussed at [employer], dates discussed:    Additional Comments:  Ninfa Meeker, RN 09/27/2016, 1:07 PM

## 2016-09-28 LAB — CBC
HEMATOCRIT: 29.7 % — AB (ref 36.0–46.0)
Hemoglobin: 9.4 g/dL — ABNORMAL LOW (ref 12.0–15.0)
MCH: 27.6 pg (ref 26.0–34.0)
MCHC: 31.6 g/dL (ref 30.0–36.0)
MCV: 87.1 fL (ref 78.0–100.0)
Platelets: 162 10*3/uL (ref 150–400)
RBC: 3.41 MIL/uL — AB (ref 3.87–5.11)
RDW: 13.3 % (ref 11.5–15.5)
WBC: 8.7 10*3/uL (ref 4.0–10.5)

## 2016-09-28 MED ORDER — ENOXAPARIN SODIUM 30 MG/0.3ML ~~LOC~~ SOLN: 30.0000 mg | Freq: Two times a day (BID) | SUBCUTANEOUS | 0 refills | Status: DC

## 2016-09-28 NOTE — Progress Notes (Signed)
PATIENT ID: Breanna Dixon  MRN: [id number]  DOB/AGE:  69/15/1947 / 69 y.o.  2 Days Post-Op Procedure(s) (LRB): TOTAL HIP ARTHROPLASTY ANTERIOR APPROACH (Left)    PROGRESS NOTE Subjective: Patient is alert, oriented, no Nausea, no Vomiting, yes passing gas, . Taking PO well. Denies SOB, Chest or Calf Pain. Using Incentive Spirometer, PAS in place. Ambulate WBAT with pt walking 120 ft with therapy Patient reports pain as  Mild at rest .    Objective: Vital signs in last 24 hours: Vitals:   09/27/16 2102 09/28/16 0448 09/28/16 0544 09/28/16 0700  BP: (!) 130/48 (!) 122/58 136/80 (!) 131/44  Pulse: 94 (!) 107 60 60  Resp: 16 17 18 18   Temp: 99.4 F (37.4 C) 98.9 F (37.2 C) 98.3 F (36.8 C) 98.4 F (36.9 C)  TempSrc: Oral Oral Oral Oral  SpO2: 97% 98% 100% 100%  Weight:      Height:          Intake/Output from previous day: I/O last 3 completed shifts: In: 2352.5 [P.O.:1440; I.V.:912.5] Out: 1300 [Urine:1300]   Intake/Output this shift: No intake/output data recorded.   LABORATORY DATA:  Recent Labs  09/27/16 0407 09/27/16 2107 09/28/16 0341  WBC 9.9  --  8.7  HGB 10.3*  --  9.4*  HCT 33.3*  --  29.7*  PLT 219  --  162  NA 135  --   --   K 4.2  --   --   CL 105  --   --   CO2 24  --   --   BUN 14  --   --   CREATININE 0.70  --   --   GLUCOSE 130*  --   --   GLUCAP  --  187*  --   CALCIUM 8.5*  --   --     Examination: Neurologically intact Neurovascular intact Sensation intact distally Intact pulses distally Dorsiflexion/Plantar flexion intact Incision: dressing C/D/I and no drainage No cellulitis present Compartment soft} XR AP&Lat of hip shows well placed\fixed THA  Assessment:   2 Days Post-Op Procedure(s) (LRB): TOTAL HIP ARTHROPLASTY ANTERIOR APPROACH (Left) ADDITIONAL DIAGNOSIS:  Expected Acute Blood Loss Anemia, Hypertension and COPD, lung canacer  Plan: PT/OT WBAT, THA  DVT Prophylaxis: SCDx72 hrs, Lovenox x 2 weeks  DISCHARGE PLAN:  Home  DISCHARGE NEEDS: HHPT, Walker and 3-in-1 comode seat

## 2016-09-28 NOTE — Discharge Summary (Signed)
Patient ID: Breanna Dixon MRN: [id number] DOB/AGE: 1945-12-03 71 y.o.  Admit date: 09/26/2016 Discharge date: 09/28/2016  Admission Diagnoses:  Principal Problem:   Osteoarthritis of left hip Active Problems:   Primary osteoarthritis of left hip   Discharge Diagnoses:  Same  Past Medical History:  Diagnosis Date  . Anxiety    takes Ativan daily, Panic attack   . Arthritis   . Bleeding ulcer 1974  . Breast cancer ([place])    right breast 2000  . Cancer [facility])    Surgery only, Lung Cancer- surgery only  . COPD (chronic obstructive pulmonary disease) ([facility])   . Depression   . Fibromyalgia    takes Lyrica daily  . Head injury with loss of consciousness ([place])   . Headache(784.0)    takes Tegretol and Verapamil nightly  . History of blood transfusion    no abnormal reaction noted  . History of bronchitis   . History of colon polyps    benign  . History of kidney stones    several  . History of ulcer disease    pyloric   . Hyperlipidemia    takes Fenofibrate daily  . Hypertension    takes Toprol daily  . Hypertension   . Joint pain   . Lung mass April 2013   Right lower lobe lung mass  . Lupus    takes Cymbalta,Plaquenil  daily  . Pneumonia    hx of  . PTSD (post-traumatic stress disorder)    takes Cymbalta daily  . Pulmonary embolism ([place]) 2006   IVC filter  . Seasonal allergies    flonase prn  . Vertigo    takes Antivert prn    Surgeries: Procedure(s): TOTAL HIP ARTHROPLASTY ANTERIOR APPROACH on 09/26/2016   Consultants:   Discharged Condition: Improved  Hospital Course: Breanna Dixon is an 71 y.o. female who was admitted 09/26/2016 for operative treatment ofOsteoarthritis of left hip. Patient has severe unremitting pain that affects sleep, daily activities, and work/hobbies. After pre-op clearance the patient was taken to the operating room on 09/26/2016 and underwent  Procedure(s): TOTAL HIP ARTHROPLASTY ANTERIOR APPROACH.    Patient was given  perioperative antibiotics: Anti-infectives    Start     Dose/Rate Route Frequency Ordered Stop   09/26/16 2200  hydroxychloroquine (PLAQUENIL) tablet 200 mg     200 mg Oral 2 times daily 09/26/16 1752     09/26/16 1230  ceFAZolin (ANCEF) IVPB 2g/100 mL premix     2 g 200 mL/hr over 30 Minutes Intravenous On call to O.R. 09/25/16 1021 09/26/16 1342       Patient was given sequential compression devices, early ambulation, and chemoprophylaxis to prevent DVT.  Patient benefited maximally from hospital stay and there were no complications.    Recent vital signs: Patient Vitals for the past 24 hrs:  BP Temp Temp src Pulse Resp SpO2  09/28/16 0700 (!) 131/44 98.4 F (36.9 C) Oral 60 18 100 %  09/28/16 0544 136/80 98.3 F (36.8 C) Oral 60 18 100 %  09/28/16 0448 (!) 122/58 98.9 F (37.2 C) Oral (!) 107 17 98 %  09/27/16 2102 (!) 130/48 99.4 F (37.4 C) Oral 94 16 97 %  09/27/16 1456 (!) 122/47 98.4 F (36.9 C) Oral 89 16 94 %     Recent laboratory studies:  Recent Labs  09/27/16 0407 09/28/16 0341  WBC 9.9 8.7  HGB 10.3* 9.4*  HCT 33.3* 29.7*  PLT 219 162  NA 135  --  K 4.2  --   CL 105  --   CO2 24  --   BUN 14  --   CREATININE 0.70  --   GLUCOSE 130*  --   CALCIUM 8.5*  --      Discharge Medications:   Allergies as of 09/28/2016      Reactions   Imitrex [sumatriptan Base] Nausea And Vomiting   Rapid heart beat.   Ketorolac Tromethamine Nausea And Vomiting   Rapid heart beat.   Stadol [butorphanol Tartrate] Nausea And Vomiting   Ivp Dye [iodinated Diagnostic Agents] Nausea Only, Palpitations   Can tolerate iodine   Shellfish Allergy Nausea Only, Swelling, Palpitations      Medication List    TAKE these medications   acyclovir 800 MG tablet Commonly known as:  ZOVIRAX Take 1 tablet (800 mg total) by mouth 4 (four) times daily.   albuterol 108 (90 Base) MCG/ACT inhaler Commonly known as:  PROVENTIL HFA;VENTOLIN HFA Inhale 2 puffs into the lungs every 4  (four) hours as needed for wheezing or shortness of breath.   arformoterol 15 MCG/2ML Nebu Commonly known as:  BROVANA Take 2 mLs (15 mcg total) by nebulization 2 (two) times daily.   carbamazepine 200 MG tablet Commonly known as:  TEGRETOL TAKE ONE TABLET BY MOUTH ONCE DAILY IN THE MORNING AND TWO AT BEDTIME   clonazePAM 1 MG tablet Commonly known as:  KLONOPIN Take 0.5 mg by mouth 2 (two) times daily.   clotrimazole-betamethasone cream Commonly known as:  LOTRISONE Apply 1 application topically 2 (two) times daily as needed (skin irritation).   dicyclomine 20 MG tablet Commonly known as:  BENTYL Take 1 tablet (20 mg total) by mouth 2 (two) times daily.   diphenoxylate-atropine 2.5-0.025 MG tablet Commonly known as:  LOMOTIL Take 1 tablet by mouth 4 (four) times daily as needed for diarrhea or loose stools.   DULoxetine 60 MG capsule Commonly known as:  CYMBALTA Take 120 mg by mouth at bedtime.   enoxaparin 30 MG/0.3ML injection Commonly known as:  LOVENOX Inject 0.3 mLs (30 mg total) into the skin every 12 (twelve) hours.   fenofibrate micronized 134 MG capsule Commonly known as:  LOFIBRA Take 134 mg by mouth daily before breakfast.   furosemide 20 MG tablet Commonly known as:  LASIX Take 1 tablet (20 mg total) by mouth daily.   Glycopyrrolate-Formoterol 9-4.8 MCG/ACT Aero Commonly known as:  BEVESPI AEROSPHERE Inhale 2 puffs into the lungs 2 (two) times daily.   hydroxychloroquine 200 MG tablet Commonly known as:  PLAQUENIL Take 200 mg by mouth 2 (two) times daily.   Ipratropium-Albuterol 20-100 MCG/ACT Aers respimat Commonly known as:  COMBIVENT RESPIMAT INHALE TWO PUFFS INTO LUNGS  EVERY 6 HOURS AS NEEDED FOR SHORTNESS OF BREATH   metoprolol succinate 100 MG 24 hr tablet Commonly known as:  TOPROL-XL Take 1 tablet (100 mg total) by mouth daily. Take with or immediately following a meal.   nicotine 14 mg/24hr patch Commonly known as:  NICODERM CQ - dosed  in mg/24 hours Place 14 mg onto the skin daily.   oxyCODONE-acetaminophen 5-325 MG tablet Commonly known as:  ROXICET Take 1 tablet by mouth every 4 (four) hours as needed.   pregabalin 100 MG capsule Commonly known as:  LYRICA Take 100-200 mg by mouth 2 (two) times daily. 1 tab in the am and 2 tabs at night.   QUEtiapine 50 MG tablet Commonly known as:  SEROQUEL Take 50 mg by mouth at  bedtime.   ranitidine 150 MG capsule Commonly known as:  ZANTAC Take 150 mg by mouth 2 (two) times daily.   REXULTI 1 MG Tabs Generic drug:  Brexpiprazole Take 1 mg by mouth daily.   tiZANidine 2 MG tablet Commonly known as:  ZANAFLEX Take 1 tablet (2 mg total) by mouth every 6 (six) hours as needed for muscle spasms.   verapamil 240 MG (CO) 24 hr tablet Commonly known as:  COVERA HS Take 240 mg by mouth at bedtime.            Durable Medical Equipment        Start     Ordered   09/26/16 1753  DME Walker rolling  Once    Question:  Patient needs a walker to treat with the following condition  Answer:  Status post total hip replacement, left   09/26/16 1752   09/26/16 1753  DME 3 n 1  Once     09/26/16 1752   09/26/16 1753  DME Bedside commode  Once    Question:  Patient needs a bedside commode to treat with the following condition  Answer:  Status post total hip replacement, left   09/26/16 1752       Discharge Care Instructions        Start     Ordered   09/28/16 0000  enoxaparin (LOVENOX) 30 MG/0.3ML injection  Every 12 hours     09/28/16 0850   09/28/16 0000  Call MD / Call 911    Comments:  If you experience chest pain or shortness of breath, CALL 911 and be transported to the hospital emergency room.  If you develope a fever above 101 F, pus (white drainage) or increased drainage or redness at the wound, or calf pain, call your surgeon's office.   09/28/16 0850   09/28/16 0000  Diet - low sodium heart healthy     09/28/16 0850   09/28/16 0000  Constipation Prevention     Comments:  Drink plenty of fluids.  Prune juice may be helpful.  You may use a stool softener, such as Colace (over the counter) 100 mg twice a day.  Use MiraLax (over the counter) for constipation as needed.   09/28/16 0850   09/28/16 0000  Increase activity slowly as tolerated     09/28/16 0850   09/28/16 0000  Patient may shower    Comments:  You may shower without a dressing once there is no drainage.  Do not wash over the wound.  If drainage remains, cover wound with plastic wrap and then shower.   09/28/16 0850   09/28/16 0000  Driving restrictions    Comments:  No driving for 2 weeks   09/28/16 0850   09/28/16 0000  Follow the hip precautions as taught in Physical Therapy     09/28/16 0850   09/26/16 0000  tiZANidine (ZANAFLEX) 2 MG tablet  Every 6 hours PRN     09/26/16 1541   09/26/16 0000  oxyCODONE-acetaminophen (ROXICET) 5-325 MG tablet  Every 4 hours PRN     09/26/16 1541      Diagnostic Studies: Dg C-arm 1-60 Min  Result Date: 09/26/2016 CLINICAL DATA:  Left hip replacement. EXAM: OPERATIVE LEFT HIP (WITH PELVIS IF PERFORMED) 2 VIEWS TECHNIQUE: Fluoroscopic spot image(s) were submitted for interpretation post-operatively. COMPARISON:  None. FINDINGS: Intraoperative fluoroscopy of a total left hip replacement. Fluoro time is 37 seconds. IMPRESSION: Total left hip arthroplasty.  Fluoro time is 37  seconds. Electronically Signed   By: Kristine Garbe M.D.   On: 09/26/2016 15:15   Mm Screening Breast Tomo Bilateral  Result Date: 09/05/2016 CLINICAL DATA:  Screening. EXAM: 2D DIGITAL SCREENING BILATERAL MAMMOGRAM WITH CAD AND ADJUNCT TOMO COMPARISON:  Previous exam(s). ACR Breast Density Category b: There are scattered areas of fibroglandular density. FINDINGS: There are no findings suspicious for malignancy. Images were processed with CAD. IMPRESSION: No mammographic evidence of malignancy. A result letter of this screening mammogram will be mailed directly to the  patient. RECOMMENDATION: Screening mammogram in one year. (Code:[id number]) BI-RADS CATEGORY  1: Negative. Electronically Signed   By: Franki Cabot M.D.   On: 09/05/2016 10:14   Dg Hip Operative Unilat W Or W/o Pelvis Left  Result Date: 09/26/2016 CLINICAL DATA:  Left hip replacement. EXAM: OPERATIVE LEFT HIP (WITH PELVIS IF PERFORMED) 2 VIEWS TECHNIQUE: Fluoroscopic spot image(s) were submitted for interpretation post-operatively. COMPARISON:  None. FINDINGS: Intraoperative fluoroscopy of a total left hip replacement. Fluoro time is 37 seconds. IMPRESSION: Total left hip arthroplasty.  Fluoro time is 37 seconds. Electronically Signed   By: Kristine Garbe M.D.   On: 09/26/2016 15:15    Disposition: 01-Home or Self Care  Discharge Instructions    Call MD / Call 911    Complete by:  As directed    If you experience chest pain or shortness of breath, CALL 911 and be transported to the hospital emergency room.  If you develope a fever above 101 F, pus (white drainage) or increased drainage or redness at the wound, or calf pain, call your surgeon's office.   Constipation Prevention    Complete by:  As directed    Drink plenty of fluids.  Prune juice may be helpful.  You may use a stool softener, such as Colace (over the counter) 100 mg twice a day.  Use MiraLax (over the counter) for constipation as needed.   Diet - low sodium heart healthy    Complete by:  As directed    Driving restrictions    Complete by:  As directed    No driving for 2 weeks   Follow the hip precautions as taught in Physical Therapy    Complete by:  As directed    Increase activity slowly as tolerated    Complete by:  As directed    Patient may shower    Complete by:  As directed    You may shower without a dressing once there is no drainage.  Do not wash over the wound.  If drainage remains, cover wound with plastic wrap and then shower.      Follow-up Information    Frederik Pear, MD Follow up in 2 week(s).    Specialty:  Orthopedic Surgery Contact information: [place] [address] [phone]        [facility].   Specialty:  [place] Why:  A representative from [facility] will contact you to arrange start date and time for your therapy. Contact information: [address] [place] [address] [phone]            Signed: Hardin Negus, Neil Errickson R 09/28/2016, 8:51 AM

## 2016-09-28 NOTE — Progress Notes (Signed)
Physical Therapy Treatment Patient Details Name: Breanna Dixon MRN: [id number] DOB: Jun 13, 1945 Today's Date: 09/28/2016    History of Present Illness Patient is a 71 y/o female admitted for L anterior THA.  She has history of multiple medical issues including but not limited to lung ca, breast ca, COPD, lupus, prior TKA R&L, HTN, fibromyalgia and head injury.     PT Comments    Pt performed increased gait and reviewed stair training during session.  Pt appears lethargic and presents with decreased safety during gait and stair training.  Pt will require assistance at home when mobilizing until strength and balance improves.     Follow Up Recommendations  Home health PT;DC plan and follow up therapy as arranged by surgeon     Equipment Recommendations  Rolling walker with 5" wheels;3in1 (PT) (In room already)    Recommendations for Other Services       Precautions / Restrictions Precautions Precautions: Anterior Hip Precaution Comments: Reviewed anterior hip precautions with patient, patient is lethargic and unable to recall.   Restrictions Weight Bearing Restrictions: Yes LLE Weight Bearing: Weight bearing as tolerated    Mobility  Bed Mobility Overal bed mobility: Needs Assistance Bed Mobility: Supine to Sit     Supine to sit: Supervision     General bed mobility comments: Pt required increased time with cues to maintain anterior hip precautions.    Transfers Overall transfer level: Needs assistance Equipment used: Rolling walker (2 wheeled) Transfers: Sit to/from Stand Sit to Stand: Min guard         General transfer comment: Cues for hand placement for safety.  Cues to control descent.    Ambulation/Gait Ambulation/Gait assistance: Min guard Ambulation Distance (Feet): 280 Feet Assistive device: Rolling walker (2 wheeled) Gait Pattern/deviations: Step-through pattern;Decreased stride length;Trunk flexed;Shuffle   Gait velocity interpretation: Below normal  speed for age/gender General Gait Details: Pt required max VCs for safety, with Max cues for B foot clearance and increasing stride length.  Pt appears drowsy with gait and required chair follow.     Stairs Stairs: Yes  min assist Stair Management: Step to pattern;One rail Left;Sideways Number of Stairs: 6 General stair comments: Cues for sequencing and use of rail to negotiate 6 steps.  Pt negotiated 5 stairs and required Max VCs to open her eyes.  PTA prompted/cued patient to descend stairs in which she climbed one more before descending to the bottom of six stairs.  Pt will require significant assistance at home to avoid falling when negotiating stairs.    Wheelchair Mobility    Modified Rankin (Stroke Patients Only)       Balance Overall balance assessment: Needs assistance   Sitting balance-Leahy Scale: Good       Standing balance-Leahy Scale: Fair                              Cognition Arousal/Alertness: Lethargic;Suspect due to medications Behavior During Therapy: [facility] for tasks assessed/performed Overall Cognitive Status: Difficult to assess                                 General Comments: Pt having a difficult time keep her eyes open during different times throughout session.        Exercises Total Joint Exercises Ankle Circles/Pumps: AROM;Both;10 reps;Supine Quad Sets: AROM;Both;10 reps;Supine Short Arc Quad: AROM;Left;10 reps;Supine Heel Slides: AAROM;Left;10 reps;Supine Long Arc  Quad: AROM;Left;10 reps;Supine    General Comments        Pertinent Vitals/Pain Pain Assessment: No/denies pain Faces Pain Scale: Hurts little more Pain Location: L hip Pain Descriptors / Indicators: Aching;Sore Pain Intervention(s): Monitored during session;Repositioned    Home Living                      Prior Function            PT Goals (current goals can now be found in the care plan section) Acute Rehab PT Goals Patient Stated  Goal: To return to independent Potential to Achieve Goals: Good Progress towards PT goals: Progressing toward goals    Frequency    7X/week      PT Plan Current plan remains appropriate    Co-evaluation              AM-PAC PT "6 Clicks" Daily Activity  Outcome Measure  Difficulty turning over in bed (including adjusting bedclothes, sheets and blankets)?: A Little Difficulty moving from lying on back to sitting on the side of the bed? : A Lot Difficulty sitting down on and standing up from a chair with arms (e.g., wheelchair, bedside commode, etc,.)?: A Little Help needed moving to and from a bed to chair (including a wheelchair)?: A Little Help needed walking in hospital room?: A Lot Help needed climbing 3-5 steps with a railing? : A Lot 6 Click Score: 15    End of Session Equipment Utilized During Treatment: Gait belt Activity Tolerance: Patient tolerated treatment well Patient left: in bed;with call bell/phone within reach   PT Visit Diagnosis: Other abnormalities of gait and mobility (R26.89);Pain Pain - Right/Left: Left Pain - part of body: Hip     Time: 6295-2841 PT Time Calculation (min) (ACUTE ONLY): 34 min  Charges:  $Gait Training: 8-22 mins $Therapeutic Exercise: 8-22 mins                    G Codes:       Governor Rooks, PTA pager [phone]    Cristela Blue 09/28/2016, 2:16 PM

## 2016-09-28 NOTE — Progress Notes (Addendum)
Physical Therapy Treatment Patient Details Name: Breanna Dixon MRN: [id number] DOB: [birth year] Today's Date: 09/28/2016    History of Present Illness Patient is a 71 y/o female admitted for L anterior THA.  She has history of multiple medical issues including but not limited to lung ca, breast ca, COPD, lupus, prior TKA R&L, HTN, fibromyalgia and head injury.     PT Comments    Pt performed increased mobility during session and able to climb 13 stairs.  Pt remains a fall risk and presents with multiple LOB when attempting to rise from a chair.  Pt educated on safety and the need for assistance at home.  Reviewed anterior precautions this afternoon and issued hand out.  Pt remains to present with DOE but O2 sats on RA 94%.     Follow Up Recommendations  Home health PT;DC plan and follow up therapy as arranged by surgeon     Equipment Recommendations  Rolling walker with 5" wheels;3in1 (PT) (In room already)    Recommendations for Other Services       Precautions / Restrictions Precautions Precautions: Anterior Hip Precaution Comments: Reviewed anterior hip precautions with patient, patient is more alert and able to recall 2/3 at end of session.  Handout issued to ensure understanding.   Restrictions Weight Bearing Restrictions: Yes LLE Weight Bearing: Weight bearing as tolerated    Mobility  Bed Mobility General bed mobility comments: Pt sitting in recliner chair on arrival.    Transfers Overall transfer level: Needs assistance Equipment used: Rolling walker (2 wheeled) Transfers: Sit to/from Stand Sit to Stand: Min assist         General transfer comment: Pt performed multiple reps to achieve standing on 2nd attempt patient with LOB backwards and required min assist on 3rd attempt to achieve standing.    Ambulation/Gait Ambulation/Gait assistance: Min guard Ambulation Distance (Feet): 180 Feet Assistive device: Rolling walker (2 wheeled) Gait Pattern/deviations:  Step-through pattern;Decreased stride length;Trunk flexed;Shuffle   Gait velocity interpretation: Below normal speed for age/gender General Gait Details: Pt required max VCs for safety, with Max cues for B foot clearance and increasing R stride length.  Chair follow for safety.     Stairs Stairs: Yes   Stair Management: Step to pattern;One rail Left;Sideways Number of Stairs: 13 General stair comments: Pt performed increased stairs duirng session. Pt required cues for sequencing and foot placement.  Performed sideways to negotiate x 13 stairs.  Pt with better carryover but remains unsafe and required x 3 rest breaks due to increased DOE.     Wheelchair Mobility    Modified Rankin (Stroke Patients Only)       Balance Overall balance assessment: Needs assistance   Sitting balance-Leahy Scale: Good     Standing balance support: No upper extremity supported Standing balance-Leahy Scale: Fair Standing balance comment: washing hands in sink                            Cognition Arousal/Alertness: Lethargic;Suspect due to medications Behavior During Therapy: [facility] for tasks assessed/performed Overall Cognitive Status: Difficult to assess                                 General Comments: Pt having a difficult time keep her eyes open during different times throughout session.        Exercises Total Joint Exercises Ankle Circles/Pumps: AROM;Both;10 reps;Supine Quad Sets:  AROM;Both;10 reps;Supine Short Arc Quad: AROM;Left;10 reps;Supine Heel Slides: AAROM;Left;10 reps;Supine Long Arc Quad: AROM;Left;10 reps;Supine    General Comments        Pertinent Vitals/Pain Pain Assessment: No/denies pain Faces Pain Scale: Hurts little more Pain Location: L hip Pain Descriptors / Indicators: Aching;Sore Pain Intervention(s): Monitored during session;Repositioned;Ice applied    Home Living                      Prior Function            PT Goals  (current goals can now be found in the care plan section) Acute Rehab PT Goals Patient Stated Goal: To return to independent Potential to Achieve Goals: Good Progress towards PT goals: Progressing toward goals    Frequency    7X/week      PT Plan Current plan remains appropriate    Co-evaluation              AM-PAC PT "6 Clicks" Daily Activity  Outcome Measure  Difficulty turning over in bed (including adjusting bedclothes, sheets and blankets)?: A Little Difficulty moving from lying on back to sitting on the side of the bed? : A Lot Difficulty sitting down on and standing up from a chair with arms (e.g., wheelchair, bedside commode, etc,.)?: Unable Help needed moving to and from a bed to chair (including a wheelchair)?: A Little Help needed walking in hospital room?: A Little Help needed climbing 3-5 steps with a railing? : A Little 6 Click Score: 7    End of Session Equipment Utilized During Treatment: Gait belt Activity Tolerance: Patient tolerated treatment well Patient left: in chair;with call bell/phone within reach Nurse Communication: Mobility status PT Visit Diagnosis: Other abnormalities of gait and mobility (R26.89);Pain Pain - Right/Left: Left Pain - part of body: Hip     Time: 4332-9518 PT Time Calculation (min) (ACUTE ONLY): 19 min  Charges:  $Gait Training: 8-22 mins                  G Codes:       Governor Rooks, PTA pager [phone]    Cristela Blue 09/28/2016, 3:07 PM

## 2016-10-01 NOTE — Anesthesia Postprocedure Evaluation (Signed)
Anesthesia Post Note  Patient: Breanna Dixon  Procedure(s) Performed: Procedure(s) (LRB): TOTAL HIP ARTHROPLASTY ANTERIOR APPROACH (Left)     Patient location during evaluation: PACU Anesthesia Type: Spinal Level of consciousness: oriented and awake and alert Pain management: pain level controlled Vital Signs Assessment: post-procedure vital signs reviewed and stable Respiratory status: spontaneous breathing, respiratory function stable and patient connected to nasal cannula oxygen Cardiovascular status: blood pressure returned to baseline and stable Postop Assessment: no headache and no backache Anesthetic complications: no    Last Vitals:  Vitals:   09/28/16 0544 09/28/16 0700  BP: 136/80 (!) 131/44  Pulse: 60 60  Resp: 18 18  Temp: 36.8 C 36.9 C  SpO2: 100% 100%    Last Pain:  Vitals:   09/28/16 0700  TempSrc: Oral  PainSc:                  [name],JAMES TERRILL

## 2016-11-06 ENCOUNTER — Ambulatory Visit: Payer: Medicare Other | Admitting: Internal Medicine

## 2016-11-08 ENCOUNTER — Ambulatory Visit: Payer: Medicare Other | Admitting: Internal Medicine

## 2016-12-24 ENCOUNTER — Encounter (HOSPITAL_COMMUNITY): Payer: Self-pay

## 2016-12-24 ENCOUNTER — Emergency Department (HOSPITAL_COMMUNITY): Payer: Medicare Other

## 2016-12-24 ENCOUNTER — Other Ambulatory Visit: Payer: Self-pay

## 2016-12-24 ENCOUNTER — Emergency Department (HOSPITAL_COMMUNITY)
Admission: EM | Admit: 2016-12-24 | Discharge: 2016-12-24 | Disposition: A | Discharge status: Home / Self Care | Payer: Medicare Other | Attending: Emergency Medicine | Admitting: Emergency Medicine

## 2016-12-24 DIAGNOSIS — Z96653 Presence of artificial knee joint, bilateral: Secondary | ICD-10-CM | POA: Insufficient documentation

## 2016-12-24 DIAGNOSIS — Z720 Tobacco use: Secondary | ICD-10-CM

## 2016-12-24 DIAGNOSIS — J209 Acute bronchitis, unspecified: Secondary | ICD-10-CM | POA: Insufficient documentation

## 2016-12-24 DIAGNOSIS — I10 Essential (primary) hypertension: Secondary | ICD-10-CM | POA: Diagnosis not present

## 2016-12-24 DIAGNOSIS — J441 Chronic obstructive pulmonary disease with (acute) exacerbation: Secondary | ICD-10-CM | POA: Diagnosis not present

## 2016-12-24 DIAGNOSIS — Z87891 Personal history of nicotine dependence: Secondary | ICD-10-CM | POA: Insufficient documentation

## 2016-12-24 DIAGNOSIS — Z79899 Other long term (current) drug therapy: Secondary | ICD-10-CM | POA: Insufficient documentation

## 2016-12-24 DIAGNOSIS — J40 Bronchitis, not specified as acute or chronic: Secondary | ICD-10-CM

## 2016-12-24 DIAGNOSIS — R0602 Shortness of breath: Secondary | ICD-10-CM | POA: Diagnosis present

## 2016-12-24 MED ORDER — AMOXICILLIN 500 MG PO CAPS
500.0000 mg | ORAL_CAPSULE | Freq: Once | ORAL | Status: AC
  Administered 2016-12-24: 500 mg via ORAL
  Filled 2016-12-24: qty 1

## 2016-12-24 MED ORDER — ALBUTEROL SULFATE (2.5 MG/3ML) 0.083% IN NEBU: 2.5000 mg | INHALATION_SOLUTION | Freq: Four times a day (QID) | RESPIRATORY_TRACT | 12 refills | Status: DC | PRN

## 2016-12-24 MED ORDER — PREDNISONE 10 MG (21) PO TBPK: ORAL_TABLET | ORAL | 0 refills | Status: DC

## 2016-12-24 MED ORDER — DEXAMETHASONE SODIUM PHOSPHATE 10 MG/ML IJ SOLN
10.0000 mg | Freq: Once | INTRAMUSCULAR | Status: AC
  Administered 2016-12-24: 10 mg via INTRAMUSCULAR
  Filled 2016-12-24: qty 1

## 2016-12-24 MED ORDER — ALBUTEROL (5 MG/ML) CONTINUOUS INHALATION SOLN
INHALATION_SOLUTION | RESPIRATORY_TRACT | Status: AC
  Filled 2016-12-24: qty 20

## 2016-12-24 MED ORDER — AMOXICILLIN 500 MG PO CAPS: 500.0000 mg | ORAL_CAPSULE | Freq: Three times a day (TID) | ORAL | 0 refills | Status: DC

## 2016-12-24 MED ORDER — ALBUTEROL (5 MG/ML) CONTINUOUS INHALATION SOLN
10.0000 mg/h | INHALATION_SOLUTION | Freq: Once | RESPIRATORY_TRACT | Status: AC
  Administered 2016-12-24: 10 mg/h via RESPIRATORY_TRACT
  Filled 2016-12-24: qty 20

## 2016-12-24 MED ORDER — METHYLPREDNISOLONE SODIUM SUCC 125 MG IJ SOLR
125.0000 mg | Freq: Once | INTRAMUSCULAR | Status: DC
  Filled 2016-12-24: qty 2

## 2016-12-24 NOTE — ED Provider Notes (Signed)
[facility] DEPT Provider Note   CSN: [id number] Arrival date & time: 12/24/16  [place]     History   Chief Complaint Chief Complaint  Patient presents with  . Cough  . Shortness of Breath    HPI Breanna Dixon is a 71 y.o. female.  Pt presents to the ED today with sob and cough.  The pt said sx have been going on for 2 weeks.  She did see her doctor and was put on zithromax and prednisone.  She just finished meds and sx are not any better.  The pt said she still has a productive cough and she can't breathe.  She does smoke and so do her 2 roommates.  She has been trying to quit, but has not been able to quit.  The pt denies any f/c.  Pt does have a nebulizer machine, but does not have meds for it.  She does have a combivent inhaler.        Past Medical History:  Diagnosis Date  . Anxiety    takes Ativan daily, Panic attack   . Arthritis   . Bleeding ulcer 1974  . Breast cancer ([place])    right breast 2000  . Cancer [facility])    Surgery only, Lung Cancer- surgery only  . COPD (chronic obstructive pulmonary disease) ([place])   . Depression   . Fibromyalgia    takes Lyrica daily  . Head injury with loss of consciousness ([place])   . Headache(784.0)    takes Tegretol and Verapamil nightly  . History of blood transfusion    no abnormal reaction noted  . History of bronchitis   . History of colon polyps    benign  . History of kidney stones    several  . History of ulcer disease    pyloric   . Hyperlipidemia    takes Fenofibrate daily  . Hypertension    takes Toprol daily  . Hypertension   . Joint pain   . Lung mass April 2013   Right lower lobe lung mass  . Lupus    takes Cymbalta,Plaquenil  daily  . Pneumonia    hx of  . PTSD (post-traumatic stress disorder)    takes Cymbalta daily  . Pulmonary embolism ([place]) 2006   IVC filter  . Seasonal allergies    flonase prn  . Vertigo    takes Antivert prn    Patient Active Problem List   Diagnosis Date Noted  . Primary osteoarthritis of left hip 09/26/2016  . Osteoarthritis of left hip 09/21/2016  . Right-sided chest wall pain 11/04/2014  . S/P lobectomy of lung-carcinoid 2016 07/15/2014  . Granulomatous lung disease ([place]) 05/25/2012  . Breast mass, left UOQ 01/23/2012  . Acute respiratory failure with hypoxia ([place]) 01/05/2012  . COPD mixed type ([place]) 01/05/2012  . Tobacco abuse 01/05/2012  . Hypertension   . 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 ABDOMINAL HYSTERECTOMY  1992  . APPENDECTOMY  1965  . BACK SURGERY  2000/2010   fusion  . BREAST EXCISIONAL BIOPSY    . BREAST LUMPECTOMY    . BREAST SURGERY     bilateral partial masectomy  . CARDIAC CATHETERIZATION  1992   no PCI  . CHOLECYSTECTOMY  1994  . COLONOSCOPY    . CYSTOSCOPY     multiple  . DILATION AND CURETTAGE OF UTERUS    . HERNIA REPAIR    .  INCISIONAL HERNIA REPAIR    . IVC filter    . JOINT REPLACEMENT Bilateral    Knees  . KNEE ARTHROSCOPY Bilateral   . MULTIPLE TOOTH EXTRACTIONS  1990  . RIGHT THORACOTOMY/RIGHT LOWER LOBE  LOBECTOMY Right 07/15/2014   Performed by Gaye Pollack, MD at [facility] OR  . TONSILLECTOMY  1967  . TOTAL HIP ARTHROPLASTY ANTERIOR APPROACH Left 09/26/2016   Performed by Frederik Pear, MD at [facility]  . TOTAL KNEE ARTHROPLASTY Left 10/15/2011   Performed by Rudean Haskell, MD at [facility]  . TOTAL KNEE ARTHROPLASTY Right 06/04/2011   Performed by Rudean Haskell, MD at [facility]  . URETHRAL DILATION  1971  . VENA CAVA FILTER PLACEMENT  2006  . VIDEO BRONCHOSCOPY WITH ENDOBRONCHIAL NAVIGATION N/A 06/02/2014   Performed by Collene Gobble, MD at [facility] OR    OB History    No data available       Home Medications    Prior to Admission medications   Medication Sig Start Date End Date Taking? Authorizing Provider  acyclovir (ZOVIRAX) 800 MG tablet Take 1 tablet (800 mg total) by mouth 4 (four) times daily. Patient not taking:  Reported on 09/10/2016 06/11/16   Fransico Meadow, PA-C  albuterol (PROVENTIL) (2.5 MG/3ML) 0.083% nebulizer solution Take 3 mLs (2.5 mg total) every 6 (six) hours as needed by nebulization for wheezing or shortness of breath. 12/24/16   Isla Pence, MD  amoxicillin (AMOXIL) 500 MG capsule Take 1 capsule (500 mg total) 3 (three) times daily by mouth. 12/24/16   Isla Pence, MD  arformoterol (BROVANA) 15 MCG/2ML NEBU Take 2 mLs (15 mcg total) by nebulization 2 (two) times daily. Patient not taking: Reported on 09/10/2016 04/06/15   Deneise Lever, MD  Brexpiprazole (REXULTI) 1 MG TABS Take 1 mg by mouth daily.    [provider]  carbamazepine (TEGRETOL) 200 MG tablet TAKE ONE TABLET BY MOUTH ONCE DAILY IN THE MORNING AND TWO AT BEDTIME 07/06/16   [provider]  clonazePAM (KLONOPIN) 1 MG tablet Take 0.5 mg by mouth 2 (two) times daily.     [provider]  clotrimazole-betamethasone (LOTRISONE) cream Apply 1 application topically 2 (two) times daily as needed (skin irritation).     [provider]  dicyclomine (BENTYL) 20 MG tablet Take 1 tablet (20 mg total) by mouth 2 (two) times daily. 07/02/16   Barnet Glasgow, NP  diphenoxylate-atropine (LOMOTIL) 2.5-0.025 MG tablet Take 1 tablet by mouth 4 (four) times daily as needed for diarrhea or loose stools. 07/02/16   Barnet Glasgow, NP  DULoxetine (CYMBALTA) 60 MG capsule Take 120 mg by mouth at bedtime.     [provider]  enoxaparin (LOVENOX) 30 MG/0.3ML injection Inject 0.3 mLs (30 mg total) into the skin every 12 (twelve) hours. 09/28/16   Leighton Parody, PA-C  fenofibrate micronized (LOFIBRA) 134 MG capsule Take 134 mg by mouth daily before breakfast.    [provider]  furosemide (LASIX) 20 MG tablet Take 1 tablet (20 mg total) by mouth daily. 01/12/12   Oswald Hillock, MD  Glycopyrrolate-Formoterol (BEVESPI AEROSPHERE) 9-4.8 MCG/ACT AERO Inhale 2 puffs into the lungs 2 (two) times daily.  09/10/16   Baird Lyons D, MD  hydroxychloroquine (PLAQUENIL) 200 MG tablet Take 200 mg by mouth 2 (two) times daily.    [provider]  Ipratropium-Albuterol (COMBIVENT RESPIMAT) 20-100 MCG/ACT AERS respimat INHALE TWO PUFFS INTO LUNGS  EVERY 6 HOURS AS  NEEDED FOR SHORTNESS OF BREATH 04/06/15   Baird Lyons D, MD  metoprolol succinate (TOPROL-XL) 100 MG 24 hr tablet Take 1 tablet (100 mg total) by mouth daily. Take with or immediately following a meal. 01/12/12   Oswald Hillock, MD  nicotine (NICODERM CQ - DOSED IN MG/24 HOURS) 14 mg/24hr patch Place 14 mg onto the skin daily.    [provider]  oxyCODONE-acetaminophen (ROXICET) 5-325 MG tablet Take 1 tablet by mouth every 4 (four) hours as needed. 09/26/16   Leighton Parody, PA-C  predniSONE (STERAPRED UNI-PAK 21 TAB) 10 MG (21) TBPK tablet Take 6 tabs by mouth daily  for 2 days, then 5 tabs for 2 days, then 4 tabs for 2 days, then 3 tabs for 2 days, 2 tabs for 2 days, then 1 tab by mouth daily for 2 days 12/24/16   Isla Pence, MD  pregabalin (LYRICA) 100 MG capsule Take 100-200 mg by mouth 2 (two) times daily. 1 tab in the am and 2 tabs at night.     [provider]  QUEtiapine (SEROQUEL) 50 MG tablet Take 50 mg by mouth at bedtime.    [provider]  ranitidine (ZANTAC) 150 MG capsule Take 150 mg by mouth 2 (two) times daily.    [provider]  tiZANidine (ZANAFLEX) 2 MG tablet Take 1 tablet (2 mg total) by mouth every 6 (six) hours as needed for muscle spasms. 09/26/16   Leighton Parody, PA-C  verapamil (COVERA HS) 240 MG (CO) 24 hr tablet Take 240 mg by mouth at bedtime.    [provider]    Family History Family History  Adopted: Yes  Problem Relation Age of Onset  . Anesthesia problems Neg Hx   . Hypotension Neg Hx   . Malignant hyperthermia Neg Hx   . Pseudochol deficiency Neg Hx     Social History Social History   Tobacco Use  . Smoking status: Former Smoker     Packs/day: 0.75    Years: 15.00    Pack years: 11.25    Types: Cigarettes    Last attempt to quit: 09/20/2016    Years since quitting: 0.2  . Smokeless tobacco: Never Used  . Tobacco comment: pt started back smoking 08/08/16  Substance Use Topics  . Alcohol use: No    Alcohol/week: 0.0 oz  . Drug use: No     Allergies   Imitrex [sumatriptan base]; Ketorolac tromethamine; Stadol [butorphanol tartrate]; Ivp dye [iodinated diagnostic agents]; and Shellfish allergy   Review of Systems Review of Systems  Respiratory: Positive for cough, shortness of breath and wheezing.   All other systems reviewed and are negative.    Physical Exam Updated Vital Signs BP 132/62 (BP Location: Left Arm)   Pulse (!) 58   Temp 98.2 F (36.8 C) (Oral)   Resp 12   Ht 5\' 7"  (1.702 m)   Wt 95.3 kg (210 lb)   LMP 06/04/2011   SpO2 99%   BMI 32.89 kg/m   Physical Exam  Constitutional: She appears well-developed and well-nourished.  HENT:  Head: Normocephalic and atraumatic.  Mouth/Throat: Oropharynx is clear and moist.  Eyes: EOM are normal. Pupils are equal, round, and reactive to light.  Neck: Normal range of motion. Neck supple.  Cardiovascular: Normal rate, regular rhythm, normal heart sounds and intact distal pulses.  Pulmonary/Chest: Effort normal. She has wheezes.  Abdominal: Soft. Bowel sounds are normal.  Musculoskeletal: Normal range of motion.  Right lower leg: Normal.       Left lower leg: Normal.  Neurological: She is alert.  Skin: Skin is warm. Capillary refill takes less than 2 seconds.  Psychiatric: She has a normal mood and affect.  Nursing note and vitals reviewed.    ED Treatments / Results  Labs (all labs ordered are listed, but only abnormal results are displayed) Labs Reviewed - No data to display  EKG  EKG Interpretation None       Radiology Dg Chest 2 View  Result Date: 12/24/2016 CLINICAL DATA:  Dyspnea x1 week. EXAM: CHEST  2 VIEW COMPARISON:   03/01/2015 FINDINGS: The heart size and mediastinal contours are within normal limits. Aortic atherosclerosis at the arch. No aneurysm. There is chronic elevation of the right hemidiaphragm with blunting of the right lateral costophrenic angle either representing a small right pleural effusion or pleural thickening. Both lungs are clear. Slightly dysplastic appearing right sixth rib. The visualized skeletal structures are nonacute with chronic multilevel degenerative disc disease along the thoracic spine. IMPRESSION: 1. Chronic elevation of the right hemidiaphragm with blunting of the right lateral costophrenic angle either representing chronic mild pleural thickening or trace pleural effusion. 2. No active pulmonary disease. 3. Aortic atherosclerosis. Electronically Signed   By: Ashley Royalty M.D.   On: 12/24/2016 21:48    Procedures Procedures (including critical care time)  Medications Ordered in ED Medications  albuterol (PROVENTIL,VENTOLIN) solution continuous neb (10 mg/hr Nebulization Given 12/24/16 2214)  albuterol (PROVENTIL, VENTOLIN) (5 MG/ML) 0.5% continuous inhalation solution (not administered)  dexamethasone (DECADRON) injection 10 mg (not administered)  amoxicillin (AMOXIL) capsule 500 mg (not administered)     Initial Impression / Assessment and Plan / ED Course  I have reviewed the triage vital signs and the nursing notes.  Pertinent labs & imaging results that were available during my care of the patient were reviewed by me and considered in my medical decision making (see chart for details).    Pt is breathing much better after continuous neb.  She still has productive sputum, so I will put her on amox.  She will be d/c home with amox and prednisone and albuterol for neb machine.  Final Clinical Impressions(s) / ED Diagnoses   Final diagnoses:  COPD exacerbation ([place])  Bronchitis  Tobacco abuse    ED Discharge Orders        Ordered    albuterol (PROVENTIL) (2.5  MG/3ML) 0.083% nebulizer solution  Every 6 hours PRN     12/24/16 2305    predniSONE (STERAPRED UNI-PAK 21 TAB) 10 MG (21) TBPK tablet     12/24/16 2305    amoxicillin (AMOXIL) 500 MG capsule  3 times daily     12/24/16 2305       Isla Pence, MD 12/24/16 2306

## 2016-12-24 NOTE — ED Triage Notes (Signed)
Patient c/o a productive cough with yellow sputum and SOB x 15 days. Patient states she has finished taking the Z-pack and prednisone.

## 2016-12-25 ENCOUNTER — Emergency Department (HOSPITAL_COMMUNITY): Payer: Medicare Other

## 2016-12-25 ENCOUNTER — Emergency Department (HOSPITAL_COMMUNITY)
Admission: EM | Admit: 2016-12-25 | Discharge: 2016-12-25 | Disposition: A | Discharge status: Home / Self Care | Payer: Medicare Other | Attending: Emergency Medicine | Admitting: Emergency Medicine

## 2016-12-25 ENCOUNTER — Encounter (HOSPITAL_COMMUNITY): Payer: Self-pay | Admitting: Emergency Medicine

## 2016-12-25 DIAGNOSIS — S7002XA Contusion of left hip, initial encounter: Secondary | ICD-10-CM | POA: Insufficient documentation

## 2016-12-25 DIAGNOSIS — I10 Essential (primary) hypertension: Secondary | ICD-10-CM | POA: Insufficient documentation

## 2016-12-25 DIAGNOSIS — Y999 Unspecified external cause status: Secondary | ICD-10-CM | POA: Diagnosis not present

## 2016-12-25 DIAGNOSIS — Y939 Activity, unspecified: Secondary | ICD-10-CM | POA: Insufficient documentation

## 2016-12-25 DIAGNOSIS — S40021A Contusion of right upper arm, initial encounter: Secondary | ICD-10-CM | POA: Insufficient documentation

## 2016-12-25 DIAGNOSIS — S79912A Unspecified injury of left hip, initial encounter: Secondary | ICD-10-CM | POA: Diagnosis present

## 2016-12-25 DIAGNOSIS — Z87891 Personal history of nicotine dependence: Secondary | ICD-10-CM | POA: Insufficient documentation

## 2016-12-25 DIAGNOSIS — Z96653 Presence of artificial knee joint, bilateral: Secondary | ICD-10-CM | POA: Insufficient documentation

## 2016-12-25 DIAGNOSIS — Z853 Personal history of malignant neoplasm of breast: Secondary | ICD-10-CM | POA: Insufficient documentation

## 2016-12-25 DIAGNOSIS — Y929 Unspecified place or not applicable: Secondary | ICD-10-CM | POA: Diagnosis not present

## 2016-12-25 DIAGNOSIS — Z79899 Other long term (current) drug therapy: Secondary | ICD-10-CM | POA: Diagnosis not present

## 2016-12-25 DIAGNOSIS — J449 Chronic obstructive pulmonary disease, unspecified: Secondary | ICD-10-CM | POA: Diagnosis not present
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
## 2016-12-25 NOTE — Discharge Instructions (Signed)
You can continue to take your medications as prescribed.  Get rechecked immediately if you develop any new or concerning symptoms.
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
## 2017-01-10 ENCOUNTER — Encounter: Payer: Self-pay | Admitting: Internal Medicine

## 2017-01-10 ENCOUNTER — Ambulatory Visit (INDEPENDENT_AMBULATORY_CARE_PROVIDER_SITE_OTHER): Payer: Medicare Other | Admitting: Internal Medicine

## 2017-01-10 VITALS — BP 118/80 | HR 60 | Ht 68.0 in | Wt 218.6 lb

## 2017-01-10 DIAGNOSIS — D3A090 Benign carcinoid tumor of the bronchus and lung: Secondary | ICD-10-CM | POA: Diagnosis not present

## 2017-01-10 DIAGNOSIS — R0683 Snoring: Secondary | ICD-10-CM | POA: Diagnosis not present

## 2017-01-10 DIAGNOSIS — Z720 Tobacco use: Secondary | ICD-10-CM | POA: Diagnosis not present

## 2017-01-10 DIAGNOSIS — G4733 Obstructive sleep apnea (adult) (pediatric): Secondary | ICD-10-CM

## 2017-01-10 DIAGNOSIS — J449 Chronic obstructive pulmonary disease, unspecified: Secondary | ICD-10-CM

## 2017-01-10 MED ORDER — GLYCOPYRROLATE-FORMOTEROL 9-4.8 MCG/ACT IN AERO: 2.0000 | INHALATION_SPRAY | Freq: Two times a day (BID) | RESPIRATORY_TRACT | 12 refills | Status: DC

## 2017-01-10 MED ORDER — ALBUTEROL SULFATE (2.5 MG/3ML) 0.083% IN NEBU: 2.5000 mg | INHALATION_SOLUTION | Freq: Four times a day (QID) | RESPIRATORY_TRACT | 12 refills | Status: DC | PRN
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
## 2017-01-10 NOTE — Assessment & Plan Note (Signed)
Recent CXR does not suggest active process Plan-continued surveillance

## 2017-01-10 NOTE — Assessment & Plan Note (Signed)
History and physical suggest significant possibility of medically important OSA.  Appropriate discussion done. Plan-schedule sleep study and call for results

## 2017-01-10 NOTE — Assessment & Plan Note (Signed)
Disappointed that she has started smoking again.  Counseling done strong encouragement to stop.  She lives in a smoking household.

## 2017-01-10 NOTE — Patient Instructions (Addendum)
Sample and Script sent for Bevespi maintenance inhaler -- use 2 puffs, twice every day  Ok to use the Combivent inhaler as a "rescue" quick fix, every 8 hours if needed, in addition to the [place]  Please try to stop smoking- It isn't helping you !!  Order- referral to Pulmonary Rehab     Dx COPD mixed type, Gold 3  Flu vax- senior  Order- DME to provide neb solution   Script printed  Order- schedule unattended home sleep test    Dx OSA

## 2017-01-10 NOTE — Assessment & Plan Note (Addendum)
Educated on medications.  May use Combivent as a rescue inhaler. Times she is not able to inhale adequately through metered inhaler during exacerbations.  She will need to have a nebulizer machine with nebulized bronchodilator medication. Plan-prescription for Bevespi, flu vaccine, arrange to get nebulizer medication through DME for Medicare benefit

## 2017-01-16 ENCOUNTER — Telehealth (HOSPITAL_COMMUNITY): Payer: Self-pay

## 2017-01-16 ENCOUNTER — Telehealth: Payer: Self-pay | Admitting: Internal Medicine

## 2017-01-16 DIAGNOSIS — J449 Chronic obstructive pulmonary disease, unspecified: Secondary | ICD-10-CM

## 2017-01-16 NOTE — Telephone Encounter (Signed)
Referral received. Attempted to call patient in regards to Insurance - Wanted to verify that she is insured with Medicare Part A & B.

## 2017-01-16 NOTE — Telephone Encounter (Signed)
Reviewed patient's chart.   Order was placed for pulmonary rehab maintenance on 01/10/17 for COPD Mixed type. Per patient's insurance, she will need to have a regular pulmonary rehab order.   CY, please advise if it is ok to place a new order to satisfy her insurance. Thanks!

## 2017-01-16 NOTE — Telephone Encounter (Signed)
Yes thanks 

## 2017-01-16 NOTE — Telephone Encounter (Signed)
Order placed for pulm rehab.  Nothing further needed.

## 2017-01-16 NOTE — Telephone Encounter (Signed)
Called Dr.Youngs office to get order changed to Pulmonary Rehab undergrad as patient is insured with Medicare Part A & B.

## 2017-01-21 ENCOUNTER — Telehealth (HOSPITAL_COMMUNITY): Payer: Self-pay

## 2017-01-21 ENCOUNTER — Encounter: Payer: Self-pay | Admitting: *Deleted

## 2017-01-21 ENCOUNTER — Telehealth: Payer: Self-pay | Admitting: Internal Medicine

## 2017-01-21 DIAGNOSIS — J449 Chronic obstructive pulmonary disease, unspecified: Secondary | ICD-10-CM

## 2017-01-21 NOTE — Telephone Encounter (Signed)
Lindsey from [facility] called in regard to patients COPD percentages - Gave message to [place]. Truddie Crumble will call back.

## 2017-01-21 NOTE — Telephone Encounter (Signed)
Spoke with [employer]. The reason the pt was denied for the [facility] program because she has not had a post bronch spiro since 2016. Truddie Crumble states that we have 2 options >> 1. Perform pre and post spiro or 2. Change the diagnosis to something other than COPD.  CY - please advise. Thanks!

## 2017-01-21 NOTE — Telephone Encounter (Signed)
ATC pt, no answer. Left message for pt to call back.  I called to schedule PFT for pt and to let her know what was needed for pulmonary rehab.

## 2017-01-21 NOTE — Telephone Encounter (Signed)
Please ask pulm rehab why not??- FEV1 is about 41% predicted. What else are they needing?

## 2017-01-21 NOTE — Telephone Encounter (Signed)
Called Dr.Youngs to request new DX as patient does not qualify for PR with COPD.

## 2017-01-21 NOTE — Telephone Encounter (Signed)
Spoke with Jarrett Soho at Pulmonary Rehab. She states that we will have to speak to [facility] about this. Jarrett Soho will have Truddie Crumble return our call once she is done with pts.

## 2017-01-21 NOTE — Telephone Encounter (Signed)
Per pulmonary rehab, pt does not qualify for the program with the diagnosis of COPD. They will need another order with a different diagnosis.  CY - please advise. Thanks.

## 2017-01-21 NOTE — Telephone Encounter (Signed)
To get her qualified for Pulmonary Rehabilitation program, they tell us we need to give them a more current pulmonary function test before and after bronchodilator.  Please order PFT     Dx COPD mixed type

## 2017-01-22 NOTE — Telephone Encounter (Signed)
Patient returned call, CB is [phone]

## 2017-01-22 NOTE — Telephone Encounter (Signed)
lmtcb X2 for pt to make aware of recs.  Will order pft after speaking to pt.

## 2017-01-22 NOTE — Telephone Encounter (Signed)
Spoke with pt. She is willing to do the PFT. Order has been placed. Pt will have to call back and schedule this as she didn't have her calendar with her when I called.

## 2017-01-23 ENCOUNTER — Telehealth: Payer: Self-pay | Admitting: Internal Medicine

## 2017-01-23 NOTE — Telephone Encounter (Signed)
Appt is sched with me on 12/27 at 3:00.  Called pt back so we can change the time.  Had to leave vm for her to call me back.

## 2017-01-23 NOTE — Telephone Encounter (Signed)
Spoke with pt, she made an appt for PFT (see previous message) and she also wants to let the PCC's know that she will be getting her PFT at 3:00. She was supposed to be picking up the equipment for the  HST on 12/27 at 3pm. PCC's please advise.

## 2017-01-25 NOTE — Telephone Encounter (Signed)
Spoke to pt.  She has PFT scheduled here at the office on 12/27 at 3:00.  I told her she can let front desk know when she is finished & then they can let me know.  We will go over hst once test completed.  Nothing further needed.

## 2017-01-31 ENCOUNTER — Ambulatory Visit (INDEPENDENT_AMBULATORY_CARE_PROVIDER_SITE_OTHER): Payer: Medicare Other | Admitting: Internal Medicine

## 2017-01-31 DIAGNOSIS — J449 Chronic obstructive pulmonary disease, unspecified: Secondary | ICD-10-CM | POA: Diagnosis not present

## 2017-01-31 LAB — PULMONARY FUNCTION TEST
DL/VA % PRED: 79 %
DL/VA: 4.1 ml/min/mmHg/L
DLCO cor % pred: 57 %
DLCO cor: 16.22 ml/min/mmHg
DLCO unc % pred: 55 %
DLCO unc: 15.69 ml/min/mmHg
FEF 25-75 PRE: 0.48 L/s
FEF 25-75 Post: 0.71 L/sec
FEF2575-%CHANGE-POST: 48 %
FEF2575-%PRED-POST: 33 %
FEF2575-%Pred-Pre: 22 %
FEV1-%CHANGE-POST: 11 %
FEV1-%PRED-PRE: 41 %
FEV1-%Pred-Post: 46 %
FEV1-PRE: 1.07 L
FEV1-Post: 1.19 L
FEV1FVC-%CHANGE-POST: 5 %
FEV1FVC-%PRED-PRE: 80 %
FEV6-%Change-Post: 4 %
FEV6-%PRED-POST: 56 %
FEV6-%Pred-Pre: 54 %
FEV6-PRE: 1.76 L
FEV6-Post: 1.84 L
FEV6FVC-%PRED-PRE: 104 %
FEV6FVC-%Pred-Post: 104 %
FVC-%CHANGE-POST: 5 %
FVC-%PRED-POST: 54 %
FVC-%PRED-PRE: 51 %
FVC-POST: 1.85 L
FVC-PRE: 1.76 L
POST FEV1/FVC RATIO: 64 %
PRE FEV6/FVC RATIO: 100 %
Post FEV6/FVC ratio: 100 %
Pre FEV1/FVC ratio: 61 %
RV % PRED: 108 %
RV: 2.52 L
TLC % pred: 81 %
TLC: 4.48 L

## 2017-01-31 NOTE — Progress Notes (Signed)
PFT completed today 01/31/17

## 2017-02-01 ENCOUNTER — Other Ambulatory Visit: Payer: Self-pay | Admitting: *Deleted

## 2017-02-01 DIAGNOSIS — G4733 Obstructive sleep apnea (adult) (pediatric): Secondary | ICD-10-CM | POA: Diagnosis not present

## 2017-02-07 ENCOUNTER — Other Ambulatory Visit: Payer: Self-pay | Admitting: Internal Medicine

## 2017-02-07 ENCOUNTER — Other Ambulatory Visit: Payer: Self-pay | Admitting: *Deleted

## 2017-02-07 DIAGNOSIS — R0683 Snoring: Secondary | ICD-10-CM

## 2017-02-07 NOTE — Progress Notes (Signed)
Previous HST order has expired - reordered and released for scanning purposes Parke Poisson, [facility] 02/07/17

## 2017-03-25 ENCOUNTER — Encounter (HOSPITAL_COMMUNITY): Payer: Self-pay | Admitting: Emergency Medicine

## 2017-03-25 ENCOUNTER — Ambulatory Visit (HOSPITAL_COMMUNITY)
Admission: EM | Admit: 2017-03-25 | Discharge: 2017-03-25 | Disposition: A | Discharge status: Home / Self Care | Payer: Medicare Other | Attending: Urgent Care | Admitting: Urgent Care

## 2017-03-25 ENCOUNTER — Other Ambulatory Visit: Payer: Self-pay

## 2017-03-25 DIAGNOSIS — Z720 Tobacco use: Secondary | ICD-10-CM

## 2017-03-25 DIAGNOSIS — F17200 Nicotine dependence, unspecified, uncomplicated: Secondary | ICD-10-CM

## 2017-03-25 DIAGNOSIS — J449 Chronic obstructive pulmonary disease, unspecified: Secondary | ICD-10-CM | POA: Diagnosis not present

## 2017-03-25 DIAGNOSIS — R059 Cough, unspecified: Secondary | ICD-10-CM

## 2017-03-25 DIAGNOSIS — R05 Cough: Secondary | ICD-10-CM | POA: Diagnosis not present

## 2017-03-25 DIAGNOSIS — J209 Acute bronchitis, unspecified: Secondary | ICD-10-CM

## 2017-03-25 DIAGNOSIS — R0789 Other chest pain: Secondary | ICD-10-CM

## 2017-03-25 MED ORDER — IPRATROPIUM BROMIDE 0.02 % IN SOLN
0.5000 mg | Freq: Once | RESPIRATORY_TRACT | Status: AC
  Administered 2017-03-25: 0.5 mg via RESPIRATORY_TRACT

## 2017-03-25 MED ORDER — PREDNISONE 20 MG PO TABS: ORAL_TABLET | ORAL | 0 refills | Status: DC

## 2017-03-25 MED ORDER — AZITHROMYCIN 250 MG PO TABS: ORAL_TABLET | ORAL | 0 refills | Status: DC

## 2017-03-25 MED ORDER — IPRATROPIUM BROMIDE 0.02 % IN SOLN
RESPIRATORY_TRACT | Status: AC
  Filled 2017-03-25: qty 2.5

## 2017-03-25 MED ORDER — ALBUTEROL SULFATE (2.5 MG/3ML) 0.083% IN NEBU: 2.5000 mg | INHALATION_SOLUTION | Freq: Four times a day (QID) | RESPIRATORY_TRACT | 1 refills | Status: DC | PRN

## 2017-03-25 MED ORDER — IBUPROFEN 800 MG PO TABS
ORAL_TABLET | ORAL | Status: AC
  Filled 2017-03-25: qty 1

## 2017-03-25 MED ORDER — ALBUTEROL SULFATE (2.5 MG/3ML) 0.083% IN NEBU
2.5000 mg | INHALATION_SOLUTION | Freq: Once | RESPIRATORY_TRACT | Status: AC
  Administered 2017-03-25: 2.5 mg via RESPIRATORY_TRACT

## 2017-03-25 MED ORDER — IBUPROFEN 800 MG PO TABS
400.0000 mg | ORAL_TABLET | Freq: Once | ORAL | Status: AC
  Administered 2017-03-25: 400 mg via ORAL

## 2017-03-25 MED ORDER — ALBUTEROL SULFATE (2.5 MG/3ML) 0.083% IN NEBU
INHALATION_SOLUTION | RESPIRATORY_TRACT | Status: AC
  Filled 2017-03-25: qty 3

## 2017-03-25 MED ORDER — BENZONATATE 100 MG PO CAPS: 100.0000 mg | ORAL_CAPSULE | Freq: Three times a day (TID) | ORAL | 0 refills | Status: DC | PRN

## 2017-03-25 NOTE — ED Provider Notes (Signed)
MRN: [id number] DOB: January 19, 1946  Subjective:   Breanna Dixon is a 72 y.o. female presenting for 1 week history of productive hacking cough, shob, wheezing. Cough elicits chest pain. She has a history of COPD, not on oxygen. Has been using her albuterol inhaler ~3 times daily with very temporary relief. She is currently smoking, is actively trying to quit. Has a remote history of chest PE in 2006.  No current facility-administered medications for this encounter.    Current Outpatient Medications  Medication Sig Dispense Refill  . Brexpiprazole (REXULTI) 2 MG TABS Take 2 mg by mouth daily.    . carbamazepine (TEGRETOL XR) 100 MG 12 hr tablet Take 100 mg by mouth 3 (three) times daily.    . clonazePAM (KLONOPIN) 1 MG tablet Take 1 tablet by mouth at bedtime.    . DULoxetine (CYMBALTA) 60 MG capsule Take 120 mg by mouth at bedtime.     . fenofibrate (TRICOR) 145 MG tablet Take 1 tablet by mouth daily.    . furosemide (LASIX) 20 MG tablet Take 1 tablet (20 mg total) by mouth daily. 30 tablet 2  . Glycopyrrolate-Formoterol (BEVESPI AEROSPHERE) 9-4.8 MCG/ACT AERO Inhale 2 puffs into the lungs 2 (two) times daily. 10.7 g 12  . Ipratropium-Albuterol (COMBIVENT RESPIMAT) 20-100 MCG/ACT AERS respimat INHALE TWO PUFFS INTO LUNGS  EVERY 6 HOURS AS NEEDED FOR SHORTNESS OF BREATH 1 Inhaler 5  . pregabalin (LYRICA) 100 MG capsule Take 100-200 mg by mouth 2 (two) times daily. 1 tab in the am and 2 tabs at night.     . QUEtiapine (SEROQUEL) 50 MG tablet Take 50 mg by mouth at bedtime.    . ranitidine (ZANTAC) 150 MG capsule Take 150 mg by mouth 2 (two) times daily.    . verapamil (COVERA HS) 240 MG (CO) 24 hr tablet Take 240 mg by mouth at bedtime.    [name] albuterol (PROVENTIL) (2.5 MG/3ML) 0.083% nebulizer solution Take 3 mLs (2.5 mg total) by nebulization every 6 (six) hours as needed for wheezing or shortness of breath. 75 mL 12  . hydroxychloroquine (PLAQUENIL) 200 MG tablet Take 200 mg by mouth 2 (two)  times daily.    . metoprolol succinate (TOPROL-XL) 100 MG 24 hr tablet Take 1 tablet (100 mg total) by mouth daily. Take with or immediately following a meal. 30 tablet 1    Adiva is allergic to imitrex [sumatriptan base]; ketorolac tromethamine; stadol [butorphanol tartrate]; ivp dye [iodinated diagnostic agents]; and shellfish allergy.  Breanna Dixon  has a past medical history of Anxiety, Arthritis, Bleeding ulcer (1974), Breast cancer ([place]), Cancer ([place]), COPD (chronic obstructive pulmonary disease) ([place]), Depression, Fibromyalgia, Head injury with loss of consciousness ([place]), Headache(784.0), History of blood transfusion, History of bronchitis, History of colon polyps, History of kidney stones, History of ulcer disease, Hyperlipidemia, Hypertension, Hypertension, Joint pain, Lung mass (April 2013), Lupus, Pneumonia, PTSD (post-traumatic stress disorder), Pulmonary embolism ([place]) (2006), Seasonal allergies, and Vertigo. Also  has a past surgical history that includes Cholecystectomy (1994); Back surgery (2000/2010); Tonsillectomy (1967); Appendectomy (1965); Cystoscopy; Urethral dilation (1971); Abdominal hysterectomy (1992); Dilation and curettage of uterus; Colonoscopy; Knee arthroscopy (Bilateral); Total knee arthroplasty (06/04/2011); Vena cava filter placement (2006); Incisional hernia repair; Multiple tooth extractions (1990); Total knee arthroplasty (10/15/2011); IVC filter; Video bronchoscopy with endobronchial navigation (N/A, 06/02/2014); Breast surgery; Hernia repair; Joint replacement (Bilateral); Cardiac catheterization (1992); Thoracotomy/lobectomy (Right, 07/15/2014); Breast lumpectomy; Breast excisional biopsy; and Total hip arthroplasty (Left, 09/26/2016).  Objective:   Vitals: BP 108/65 (BP Location:  Left Arm)   Pulse (!) 101   Temp 98.4 F (36.9 C) (Oral)   Resp (!) 22   LMP 06/04/2011   SpO2 95%   Physical Exam  Constitutional: She is oriented to person, place, and time. She appears  well-developed and well-nourished.  Eyes: No scleral icterus.  Cardiovascular: Normal rate, regular rhythm and intact distal pulses. Exam reveals no gallop and no friction rub.  No murmur heard. Pulmonary/Chest: No respiratory distress. She has wheezes. She has no rales.  Rhonchi auscultated over lower lung fields.  Neurological: She is alert and oriented to person, place, and time.  Skin: Skin is warm and dry.  Psychiatric: She has a normal mood and affect.   Assessment and Plan :   Acute bronchitis, unspecified organism  Cough  Atypical chest pain  Chronic obstructive pulmonary disease, unspecified COPD type ([place])  Tobacco use disorder  Patient significantly improved s/p nebulizer treatment. Will start patient on azithromycin, prednisone course. Schedule rescue inhaler and alternate with nebulizer. Use Tessalon for cough suppression. Encouraged efforts at smoking cessation. Return-to-clinic precautions discussed regarding worsening symptoms including chest PE, patient verbalized understanding. Otherwise, patient will f/u with her pulmonologist on 04/12/2017.   Jaynee Eagles, PA-C 03/25/17 1527

## 2017-03-25 NOTE — Discharge Instructions (Signed)
Hydrate well with at least 2 liters (1 gallon) of water daily.

## 2017-03-25 NOTE — ED Triage Notes (Addendum)
Pt here for SOB and cough for about a week.  Pt has COPD.  Pt last used her inhaler at 0500 and just now in the triage room as she thought she shouldn't use it because she was coming here. Pt states she took 1500mg  of tylenol at 0520 for a fever of possibly 102.9.  There was some confusion as to the accurate temperature.  Pt does seem to have some periods of mild confusion.

## 2017-03-26 ENCOUNTER — Telehealth (HOSPITAL_COMMUNITY): Payer: Self-pay

## 2017-03-26 NOTE — Telephone Encounter (Signed)
Attempted to call patient in regards to Pulmonary Rehab - lm on vm °

## 2017-03-28 ENCOUNTER — Other Ambulatory Visit: Payer: Self-pay

## 2017-03-28 ENCOUNTER — Emergency Department (HOSPITAL_COMMUNITY): Payer: Medicare Other

## 2017-03-28 ENCOUNTER — Inpatient Hospital Stay (HOSPITAL_COMMUNITY)
Admission: EM | Admit: 2017-03-28 | Discharge: 2017-04-02 | DRG: 193 | Disposition: A | Discharge status: Home / Self Care | Payer: Medicare Other | Attending: Family Medicine | Admitting: Family Medicine

## 2017-03-28 ENCOUNTER — Encounter (HOSPITAL_COMMUNITY): Payer: Self-pay | Admitting: Obstetrics and Gynecology

## 2017-03-28 DIAGNOSIS — M797 Fibromyalgia: Secondary | ICD-10-CM | POA: Diagnosis present

## 2017-03-28 DIAGNOSIS — R0902 Hypoxemia: Secondary | ICD-10-CM | POA: Diagnosis not present

## 2017-03-28 DIAGNOSIS — Z7952 Long term (current) use of systemic steroids: Secondary | ICD-10-CM

## 2017-03-28 DIAGNOSIS — Z853 Personal history of malignant neoplasm of breast: Secondary | ICD-10-CM

## 2017-03-28 DIAGNOSIS — Z96653 Presence of artificial knee joint, bilateral: Secondary | ICD-10-CM | POA: Diagnosis present

## 2017-03-28 DIAGNOSIS — J1008 Influenza due to other identified influenza virus with other specified pneumonia: Secondary | ICD-10-CM | POA: Diagnosis not present

## 2017-03-28 DIAGNOSIS — Z91041 Radiographic dye allergy status: Secondary | ICD-10-CM

## 2017-03-28 DIAGNOSIS — R42 Dizziness and giddiness: Secondary | ICD-10-CM | POA: Diagnosis present

## 2017-03-28 DIAGNOSIS — J9601 Acute respiratory failure with hypoxia: Secondary | ICD-10-CM | POA: Diagnosis present

## 2017-03-28 DIAGNOSIS — J441 Chronic obstructive pulmonary disease with (acute) exacerbation: Secondary | ICD-10-CM

## 2017-03-28 DIAGNOSIS — J129 Viral pneumonia, unspecified: Secondary | ICD-10-CM | POA: Diagnosis present

## 2017-03-28 DIAGNOSIS — J181 Lobar pneumonia, unspecified organism: Secondary | ICD-10-CM

## 2017-03-28 DIAGNOSIS — J449 Chronic obstructive pulmonary disease, unspecified: Secondary | ICD-10-CM | POA: Diagnosis not present

## 2017-03-28 DIAGNOSIS — E876 Hypokalemia: Secondary | ICD-10-CM | POA: Diagnosis present

## 2017-03-28 DIAGNOSIS — Z885 Allergy status to narcotic agent status: Secondary | ICD-10-CM

## 2017-03-28 DIAGNOSIS — F4310 Post-traumatic stress disorder, unspecified: Secondary | ICD-10-CM | POA: Diagnosis present

## 2017-03-28 DIAGNOSIS — J302 Other seasonal allergic rhinitis: Secondary | ICD-10-CM | POA: Diagnosis present

## 2017-03-28 DIAGNOSIS — M940 Chondrocostal junction syndrome [Tietze]: Secondary | ICD-10-CM | POA: Diagnosis present

## 2017-03-28 DIAGNOSIS — Z85118 Personal history of other malignant neoplasm of bronchus and lung: Secondary | ICD-10-CM

## 2017-03-28 DIAGNOSIS — I10 Essential (primary) hypertension: Secondary | ICD-10-CM | POA: Diagnosis present

## 2017-03-28 DIAGNOSIS — J189 Pneumonia, unspecified organism: Secondary | ICD-10-CM | POA: Diagnosis not present

## 2017-03-28 DIAGNOSIS — Z96642 Presence of left artificial hip joint: Secondary | ICD-10-CM | POA: Diagnosis present

## 2017-03-28 DIAGNOSIS — Z720 Tobacco use: Secondary | ICD-10-CM | POA: Diagnosis present

## 2017-03-28 DIAGNOSIS — Z91013 Allergy to seafood: Secondary | ICD-10-CM

## 2017-03-28 DIAGNOSIS — F410 Panic disorder [episodic paroxysmal anxiety] without agoraphobia: Secondary | ICD-10-CM | POA: Diagnosis present

## 2017-03-28 DIAGNOSIS — R0602 Shortness of breath: Secondary | ICD-10-CM | POA: Diagnosis not present

## 2017-03-28 DIAGNOSIS — J440 Chronic obstructive pulmonary disease with acute lower respiratory infection: Secondary | ICD-10-CM | POA: Diagnosis present

## 2017-03-28 DIAGNOSIS — E785 Hyperlipidemia, unspecified: Secondary | ICD-10-CM | POA: Diagnosis present

## 2017-03-28 DIAGNOSIS — Z888 Allergy status to other drugs, medicaments and biological substances status: Secondary | ICD-10-CM

## 2017-03-28 DIAGNOSIS — Z902 Acquired absence of lung [part of]: Secondary | ICD-10-CM

## 2017-03-28 DIAGNOSIS — F17210 Nicotine dependence, cigarettes, uncomplicated: Secondary | ICD-10-CM | POA: Diagnosis present

## 2017-03-28 DIAGNOSIS — Z79899 Other long term (current) drug therapy: Secondary | ICD-10-CM

## 2017-03-28 DIAGNOSIS — F329 Major depressive disorder, single episode, unspecified: Secondary | ICD-10-CM | POA: Diagnosis present

## 2017-03-28 LAB — COMPREHENSIVE METABOLIC PANEL
ALK PHOS: 63 U/L (ref 38–126)
ALT: 17 U/L (ref 14–54)
AST: 35 U/L (ref 15–41)
Albumin: 3.5 g/dL (ref 3.5–5.0)
Anion gap: 14 (ref 5–15)
BUN: 11 mg/dL (ref 6–20)
CALCIUM: 8.5 mg/dL — AB (ref 8.9–10.3)
CO2: 21 mmol/L — ABNORMAL LOW (ref 22–32)
CREATININE: 0.64 mg/dL (ref 0.44–1.00)
Chloride: 103 mmol/L (ref 101–111)
Glucose, Bld: 140 mg/dL — ABNORMAL HIGH (ref 65–99)
Potassium: 3.3 mmol/L — ABNORMAL LOW (ref 3.5–5.1)
Sodium: 138 mmol/L (ref 135–145)
Total Bilirubin: 0.9 mg/dL (ref 0.3–1.2)
Total Protein: 6.9 g/dL (ref 6.5–8.1)

## 2017-03-28 LAB — CBC WITH DIFFERENTIAL/PLATELET
BASOS PCT: 0 %
Basophils Absolute: 0 10*3/uL (ref 0.0–0.1)
EOS ABS: 0 10*3/uL (ref 0.0–0.7)
EOS PCT: 0 %
HCT: 37.9 % (ref 36.0–46.0)
HEMOGLOBIN: 12.3 g/dL (ref 12.0–15.0)
Lymphocytes Relative: 6 %
Lymphs Abs: 0.5 10*3/uL — ABNORMAL LOW (ref 0.7–4.0)
MCH: 28 pg (ref 26.0–34.0)
MCHC: 32.5 g/dL (ref 30.0–36.0)
MCV: 86.1 fL (ref 78.0–100.0)
Monocytes Absolute: 0.4 10*3/uL (ref 0.1–1.0)
Monocytes Relative: 6 %
NEUTROS PCT: 88 %
Neutro Abs: 7 10*3/uL (ref 1.7–7.7)
PLATELETS: 197 10*3/uL (ref 150–400)
RBC: 4.4 MIL/uL (ref 3.87–5.11)
RDW: 15 % (ref 11.5–15.5)
WBC: 7.9 10*3/uL (ref 4.0–10.5)

## 2017-03-28 LAB — BRAIN NATRIURETIC PEPTIDE: B NATRIURETIC PEPTIDE 5: 21.5 pg/mL (ref 0.0–100.0)

## 2017-03-28 LAB — INFLUENZA PANEL BY PCR (TYPE A & B)
Influenza A By PCR: POSITIVE — AB
Influenza B By PCR: NEGATIVE

## 2017-03-28 LAB — TROPONIN I

## 2017-03-28 MED ORDER — PREGABALIN 50 MG PO CAPS: 100.0000 mg | ORAL_CAPSULE | Freq: Two times a day (BID) | ORAL | Status: DC

## 2017-03-28 MED ORDER — SODIUM CHLORIDE 0.9 % IV SOLN
1.0000 g | Freq: Once | INTRAVENOUS | Status: AC
  Administered 2017-03-28: 1 g via INTRAVENOUS
  Filled 2017-03-28: qty 10

## 2017-03-28 MED ORDER — METHYLPREDNISOLONE SODIUM SUCC 125 MG IJ SOLR
125.0000 mg | Freq: Once | INTRAMUSCULAR | Status: AC
  Administered 2017-03-28: 125 mg via INTRAVENOUS
  Filled 2017-03-28: qty 2

## 2017-03-28 MED ORDER — QUETIAPINE FUMARATE 50 MG PO TABS
50.0000 mg | ORAL_TABLET | Freq: Every day | ORAL | Status: DC
Start: 2017-03-28 — End: 2017-04-02
  Administered 2017-03-28 – 2017-04-01 (×5): 50 mg via ORAL
  Filled 2017-03-28 (×5): qty 1

## 2017-03-28 MED ORDER — ALBUTEROL SULFATE (2.5 MG/3ML) 0.083% IN NEBU: 5.0000 mg | INHALATION_SOLUTION | Freq: Once | RESPIRATORY_TRACT | Status: DC

## 2017-03-28 MED ORDER — FUROSEMIDE 40 MG PO TABS
20.0000 mg | ORAL_TABLET | Freq: Every day | ORAL | Status: DC
  Administered 2017-03-29: 11:00:00 20 mg via ORAL
  Filled 2017-03-28: qty 1

## 2017-03-28 MED ORDER — HYDROXYCHLOROQUINE SULFATE 200 MG PO TABS
200.0000 mg | ORAL_TABLET | Freq: Two times a day (BID) | ORAL | Status: DC
  Administered 2017-03-28 – 2017-04-02 (×10): 200 mg via ORAL
  Filled 2017-03-28 (×10): qty 1

## 2017-03-28 MED ORDER — ALBUTEROL SULFATE (2.5 MG/3ML) 0.083% IN NEBU
2.5000 mg | INHALATION_SOLUTION | Freq: Four times a day (QID) | RESPIRATORY_TRACT | Status: DC | PRN
  Administered 2017-03-29: 12:00:00 2.5 mg via RESPIRATORY_TRACT
  Filled 2017-03-28: qty 3

## 2017-03-28 MED ORDER — BREXPIPRAZOLE 1 MG PO TABS
2.0000 mg | ORAL_TABLET | Freq: Every day | ORAL | Status: DC
  Administered 2017-03-29 – 2017-04-02 (×5): 2 mg via ORAL
  Filled 2017-03-28 (×5): qty 2

## 2017-03-28 MED ORDER — BREXPIPRAZOLE 2 MG PO TABS: 2.0000 mg | ORAL_TABLET | Freq: Every day | ORAL | Status: DC

## 2017-03-28 MED ORDER — CARBAMAZEPINE ER 100 MG PO TB12
100.0000 mg | ORAL_TABLET | Freq: Three times a day (TID) | ORAL | Status: DC
  Administered 2017-03-28 – 2017-04-02 (×15): 100 mg via ORAL
  Filled 2017-03-28 (×16): qty 1

## 2017-03-28 MED ORDER — VERAPAMIL HCL ER 240 MG PO TBCR
240.0000 mg | EXTENDED_RELEASE_TABLET | Freq: Every day | ORAL | Status: DC
  Administered 2017-03-28 – 2017-04-01 (×5): 240 mg via ORAL
  Filled 2017-03-28 (×5): qty 1

## 2017-03-28 MED ORDER — VANCOMYCIN HCL 10 G IV SOLR
1500.0000 mg | Freq: Once | INTRAVENOUS | Status: DC
  Filled 2017-03-28: qty 1500

## 2017-03-28 MED ORDER — SODIUM CHLORIDE 0.9 % IV SOLN
1.0000 g | Freq: Once | INTRAVENOUS | Status: DC
  Filled 2017-03-28: qty 1

## 2017-03-28 MED ORDER — SODIUM CHLORIDE 0.9% FLUSH
3.0000 mL | INTRAVENOUS | Status: DC | PRN
  Administered 2017-03-31: 3 mL via INTRAVENOUS
  Filled 2017-03-28: qty 3

## 2017-03-28 MED ORDER — SODIUM CHLORIDE 0.9 % IV SOLN: 250.0000 mL | INTRAVENOUS | Status: DC | PRN

## 2017-03-28 MED ORDER — DULOXETINE HCL 30 MG PO CPEP
120.0000 mg | ORAL_CAPSULE | Freq: Every day | ORAL | Status: DC
  Administered 2017-03-28 – 2017-04-01 (×5): 120 mg via ORAL
  Filled 2017-03-28 (×5): qty 4

## 2017-03-28 MED ORDER — PREGABALIN 100 MG PO CAPS
200.0000 mg | ORAL_CAPSULE | Freq: Every day | ORAL | Status: DC
  Administered 2017-03-28 – 2017-04-01 (×5): 200 mg via ORAL
  Filled 2017-03-28 (×5): qty 2

## 2017-03-28 MED ORDER — SODIUM CHLORIDE 0.9 % IV SOLN
500.0000 mg | INTRAVENOUS | Status: DC
  Administered 2017-03-29 – 2017-03-31 (×3): 500 mg via INTRAVENOUS
  Filled 2017-03-28 (×3): qty 500

## 2017-03-28 MED ORDER — PREGABALIN 100 MG PO CAPS
100.0000 mg | ORAL_CAPSULE | Freq: Every day | ORAL | Status: DC
  Administered 2017-03-28 – 2017-04-02 (×6): 100 mg via ORAL
  Filled 2017-03-28 (×6): qty 1

## 2017-03-28 MED ORDER — ALBUTEROL (5 MG/ML) CONTINUOUS INHALATION SOLN
10.0000 mg/h | INHALATION_SOLUTION | RESPIRATORY_TRACT | Status: DC
  Administered 2017-03-28: 10 mg/h via RESPIRATORY_TRACT
  Filled 2017-03-28: qty 20

## 2017-03-28 MED ORDER — CLONAZEPAM 0.5 MG PO TABS
1.0000 mg | ORAL_TABLET | Freq: Every day | ORAL | Status: DC
  Administered 2017-03-28 – 2017-03-29 (×2): 1 mg via ORAL
  Filled 2017-03-28 (×2): qty 2

## 2017-03-28 MED ORDER — SODIUM CHLORIDE 0.9% FLUSH
3.0000 mL | Freq: Two times a day (BID) | INTRAVENOUS | Status: DC
  Administered 2017-03-31 – 2017-04-01 (×2): 3 mL via INTRAVENOUS

## 2017-03-28 MED ORDER — METOPROLOL SUCCINATE ER 100 MG PO TB24
100.0000 mg | ORAL_TABLET | Freq: Every day | ORAL | Status: DC
  Administered 2017-03-29 – 2017-04-02 (×4): 100 mg via ORAL
  Filled 2017-03-28 (×5): qty 1

## 2017-03-28 MED ORDER — AZITHROMYCIN 500 MG IV SOLR
500.0000 mg | Freq: Once | INTRAVENOUS | Status: AC
  Administered 2017-03-28: 500 mg via INTRAVENOUS
  Filled 2017-03-28: qty 500

## 2017-03-28 MED ORDER — SODIUM CHLORIDE 0.9 % IV SOLN
1.0000 g | INTRAVENOUS | Status: DC
  Administered 2017-03-29 – 2017-04-01 (×4): 1 g via INTRAVENOUS
  Filled 2017-03-28 (×2): qty 1
  Filled 2017-03-28: qty 10
  Filled 2017-03-28 (×2): qty 1

## 2017-03-28 MED ORDER — IPRATROPIUM-ALBUTEROL 0.5-2.5 (3) MG/3ML IN SOLN
3.0000 mL | Freq: Four times a day (QID) | RESPIRATORY_TRACT | Status: DC
  Administered 2017-03-28 – 2017-03-29 (×4): 3 mL via RESPIRATORY_TRACT
  Filled 2017-03-28 (×4): qty 3

## 2017-03-28 NOTE — Progress Notes (Signed)
A consult was received from an ED physician for Vancomycin per pharmacy dosing. The patient's profile has been reviewed for ht/wt/allergies/indication/available labs. A one time order has been placed for the above antibiotics.  Further antibiotics/pharmacy consults should be ordered by admitting physician if indicated.                       Reuel Boom, PharmD, BCPS [phone] 03/28/2017, 10:21 AM

## 2017-03-28 NOTE — ED Notes (Signed)
Bed: WA20 Expected date:  Expected time:  Means of arrival:  Comments: EMS-copd

## 2017-03-28 NOTE — ED Notes (Signed)
Patient transported to X-ray 

## 2017-03-28 NOTE — ED Triage Notes (Signed)
Per EMS: Pt is coming from home. Pt reported she has been SOB for approx. 2 weeks and it worsened this morning. Pt has a hx of COPD and was dx with bronchitis on the 18th. Initial SPO2 was 89% on RA.  Audible wheezing Pt given 10mg  of albuterol and 1mg  of Atrovent.  125mg  of Solumedrol 2g of Magnesium  Pt has a 22g IV at the left wrist Pt still has some slight wheezing. Pt is able to talk in complete sentences at this time.

## 2017-03-28 NOTE — ED Notes (Signed)
Respiratory called for continuous nebulizer tx.

## 2017-03-28 NOTE — ED Provider Notes (Signed)
[facility] DEPT Provider Note   CSN: [id number] Arrival date & time: 03/28/17  0746     History   Chief Complaint Chief Complaint  Patient presents with  . Shortness of Breath    HPI [place] A [place] is a 72 y.o. female.  HPI Patient presents with dyspnea, cough. Onset was about 1 week ago. Since onset symptoms been progressive. Patient did go to urgent care, was prescribed Tessalon [place]. She notes that she cannot afford that medication. No other attempts at relief. She states that she takes all of her home medication as directed, however. No fever over the past day, though she had fever earlier this week. No vomiting, no diarrhea.  Past Medical History:  Diagnosis Date  . Anxiety    takes Ativan daily, Panic attack   . Arthritis   . Bleeding ulcer 1974  . Breast cancer ([place])    right breast 2000  . Cancer [facility])    Surgery only, Lung Cancer- surgery only  . COPD (chronic obstructive pulmonary disease) ([place])   . Depression   . Fibromyalgia    takes Lyrica daily  . Head injury with loss of consciousness ([place])   . Headache(784.0)    takes Tegretol and Verapamil nightly  . History of blood transfusion    no abnormal reaction noted  . History of bronchitis   . History of colon polyps    benign  . History of kidney stones    several  . History of ulcer disease    pyloric   . Hyperlipidemia    takes Fenofibrate daily  . Hypertension    takes Toprol daily  . Hypertension   . Joint pain   . Lung mass April 2013   Right lower lobe lung mass  . Lupus    takes Cymbalta,Plaquenil  daily  . Pneumonia    hx of  . PTSD (post-traumatic stress disorder)    takes Cymbalta daily  . Pulmonary embolism ([place]) 2006   IVC filter  . Seasonal allergies    flonase prn  . Vertigo    takes Antivert prn    Patient Active Problem List   Diagnosis Date Noted  . Snoring 01/10/2017  . Primary osteoarthritis of left hip 09/26/2016  .  Osteoarthritis of left hip 09/21/2016  . Right-sided chest wall pain 11/04/2014  . S/P lobectomy of lung-carcinoid 2016 07/15/2014  . Granulomatous lung disease ([place]) 05/25/2012  . Breast mass, left UOQ 01/23/2012  . Acute respiratory failure with hypoxia ([place]) 01/05/2012  . COPD mixed type ([place]) 01/05/2012  . Tobacco abuse 01/05/2012  . Hypertension   . 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 ABDOMINAL HYSTERECTOMY  1992  . APPENDECTOMY  1965  . BACK SURGERY  2000/2010   fusion  . BREAST EXCISIONAL BIOPSY    . BREAST LUMPECTOMY    . BREAST SURGERY     bilateral partial masectomy  . CARDIAC CATHETERIZATION  1992   no PCI  . CHOLECYSTECTOMY  1994  . COLONOSCOPY    . CYSTOSCOPY     multiple  . DILATION AND CURETTAGE OF UTERUS    . HERNIA REPAIR    . INCISIONAL HERNIA REPAIR    . IVC filter    . JOINT REPLACEMENT Bilateral    Knees  . KNEE ARTHROSCOPY Bilateral   . MULTIPLE TOOTH EXTRACTIONS  1990  . THORACOTOMY/LOBECTOMY Right 07/15/2014   Procedure: RIGHT THORACOTOMY/RIGHT LOWER  LOBE  LOBECTOMY;  Surgeon: Gaye Pollack, MD;  Location: [facility];  Service: Thoracic;  Laterality: Right;  . TONSILLECTOMY  1967  . TOTAL HIP ARTHROPLASTY Left 09/26/2016   Procedure: TOTAL HIP ARTHROPLASTY ANTERIOR APPROACH;  Surgeon: Frederik Pear, MD;  Location: [facility];  Service: Orthopedics;  Laterality: Left;  . TOTAL KNEE ARTHROPLASTY  06/04/2011   Procedure: TOTAL KNEE ARTHROPLASTY;  Surgeon: Rudean Haskell, MD;  Location: [facility];  Service: Orthopedics;  Laterality: Right;  . TOTAL KNEE ARTHROPLASTY  10/15/2011   Procedure: TOTAL KNEE ARTHROPLASTY;  Surgeon: Rudean Haskell, MD;  Location: [facility];  Service: Orthopedics;  Laterality: Left;  . [place]  . VENA CAVA FILTER PLACEMENT  2006  . VIDEO BRONCHOSCOPY WITH ENDOBRONCHIAL NAVIGATION N/A 06/02/2014   Procedure: VIDEO BRONCHOSCOPY WITH ENDOBRONCHIAL NAVIGATION;  Surgeon: Collene Gobble, MD;  Location: [facility];  Service: Thoracic;  Laterality: N/A;    OB History    No data available       Home Medications    Prior to Admission medications   Medication Sig Start Date End Date Taking? Authorizing Provider  albuterol (PROVENTIL) (2.5 MG/3ML) 0.083% nebulizer solution Take 3 mLs (2.5 mg total) by nebulization every 6 (six) hours as needed for wheezing or shortness of breath. 03/25/17  Yes Jaynee Eagles, PA-C  Brexpiprazole (REXULTI) 2 MG TABS Take 2 mg by mouth daily.   Yes [provider]  carbamazepine (TEGRETOL XR) 100 MG 12 hr tablet Take 100 mg by mouth 3 (three) times daily.   Yes [provider]  clonazePAM (KLONOPIN) 1 MG tablet Take 1 tablet by mouth at bedtime.   Yes [provider]  DULoxetine (CYMBALTA) 60 MG capsule Take 120 mg by mouth at bedtime.    Yes [provider]  fenofibrate (TRICOR) 145 MG tablet Take 1 tablet by mouth daily.   Yes [provider]  furosemide (LASIX) 20 MG tablet Take 1 tablet (20 mg total) by mouth daily. 01/12/12  Yes Oswald Hillock, MD  Glycopyrrolate-Formoterol (BEVESPI AEROSPHERE) 9-4.8 MCG/ACT AERO Inhale 2 puffs into the lungs 2 (two) times daily. 01/10/17  Yes Young, Tarri Fuller D, MD  hydroxychloroquine (PLAQUENIL) 200 MG tablet Take 200 mg by mouth 2 (two) times daily.   Yes [provider]  Ipratropium-Albuterol (COMBIVENT RESPIMAT) 20-100 MCG/ACT AERS respimat INHALE TWO PUFFS INTO LUNGS  EVERY 6 HOURS AS NEEDED FOR SHORTNESS OF BREATH 04/06/15  Yes Young, Clinton D, MD  metoprolol succinate (TOPROL-XL) 100 MG 24 hr tablet Take 1 tablet (100 mg total) by mouth daily. Take with or immediately following a meal. 01/12/12  Yes Darrick Meigs, Marge Duncans, MD  predniSONE (DELTASONE) 20 MG tablet Take 2 tablets daily with breakfast. 03/25/17  Yes Jaynee Eagles, PA-C  pregabalin (LYRICA) 100 MG capsule Take 100-200 mg by mouth 2 (two) times daily. 1 tab in the am and 2 tabs at night.    Yes [provider]  QUEtiapine (SEROQUEL) 50 MG tablet Take 50 mg by mouth at bedtime.   Yes [provider]  ranitidine (ZANTAC) 150 MG capsule Take 150 mg by mouth 2 (two) times daily.   Yes [provider]  verapamil (COVERA HS) 240 MG (CO) 24 hr tablet Take 240 mg by mouth at bedtime.   Yes [provider]  azithromycin (ZITHROMAX) 250 MG tablet Start with 2 tablets today, then 1 daily thereafter. Patient not taking: Reported on 03/28/2017 03/25/17   Jaynee Eagles, PA-C  benzonatate (TESSALON) 100 MG capsule Take 1-2 capsules (100-200 mg total) by mouth 3 (three) times daily as needed for cough. Patient not taking: Reported on 03/28/2017 03/25/17   Jaynee Eagles, PA-C    Family History Family History  Adopted: Yes  Problem Relation Age of Onset  . Anesthesia problems Neg Hx   . Hypotension Neg Hx   . Malignant hyperthermia Neg Hx   . Pseudochol deficiency Neg Hx     Social History Social History   Tobacco Use  . Smoking status: Current Every Day Smoker    Packs/day: 1.00    Types: Cigarettes    Last attempt to quit: 09/20/2016    Years since quitting: 0.5  . Smokeless tobacco: Never Used  . Tobacco comment: pt started back smoking 08/08/16  Substance Use Topics  . Alcohol use: No    Alcohol/week: 0.0 oz  . Drug use: No     Allergies   Imitrex [sumatriptan base]; Ketorolac tromethamine; Stadol [butorphanol tartrate]; Ivp dye [iodinated diagnostic agents]; and Shellfish allergy   Review of Systems Review of Systems  Constitutional:       Per HPI, otherwise negative  HENT:       Per HPI, otherwise negative  Respiratory:       Per HPI, otherwise negative  Cardiovascular:       Per HPI, otherwise negative  Gastrointestinal: Negative for vomiting.  Endocrine:       Negative aside from HPI  Genitourinary:       Neg aside from HPI   Musculoskeletal:       Per HPI, otherwise negative  Skin: Negative.   Neurological: Negative for syncope.      Physical Exam Updated Vital Signs BP 136/63   Pulse 83   Temp 98.2 F (36.8 C) (Oral)   Resp (!) 23   LMP 06/04/2011   SpO2 94%   Physical Exam  Constitutional: She is oriented to person, place, and time. She appears well-developed and well-nourished. No distress.  HENT:  Head: Normocephalic and atraumatic.  Eyes: Conjunctivae and EOM are normal.  Cardiovascular: Normal rate and regular rhythm.  Pulmonary/Chest: She has decreased breath sounds. She has wheezes. She has rales.  Abdominal: She exhibits no distension.  Musculoskeletal: She exhibits no edema.  Neurological: She is alert and oriented to person, place, and time. No cranial nerve deficit.  Skin: Skin is warm and dry.  Psychiatric: She has a normal mood and affect.  Nursing note and vitals reviewed.    ED Treatments / Results  Labs (all labs ordered are listed, but only abnormal results are displayed) Labs Reviewed  COMPREHENSIVE METABOLIC PANEL - Abnormal; Notable for the following components:      Result Value   Potassium 3.3 (*)    CO2 21 (*)    Glucose, Bld 140 (*)    Calcium 8.5 (*)    All other components within normal limits  CBC WITH DIFFERENTIAL/PLATELET - Abnormal; Notable for the following components:   Lymphs Abs 0.5 (*)    All other components within normal limits  TROPONIN I  BRAIN NATRIURETIC PEPTIDE  INFLUENZA PANEL BY PCR (TYPE A & B)    EKG  EKG Interpretation  Date/Time:  Thursday March 28 2017 07:51:29 EST Ventricular Rate:  90 PR Interval:    QRS Duration: 86 QT Interval:  373 QTC Calculation: 457 R Axis:   56 Text Interpretation:  Sinus rhythm Probable left atrial enlargement Borderline repolarization abnormality Artifact T wave abnormality Abnormal ekg  Confirmed by Carmin Muskrat [phone]) on 03/28/2017 8:25:31 AM Also confirmed by Carmin Muskrat ([id number]), editor Philomena Doheny [phone])  on 03/28/2017 9:09:19 AM       Radiology Dg Chest 2 View  Result Date:  03/28/2017 CLINICAL DATA:  Severe shortness of breath.  Wheezing. EXAM: CHEST  2 VIEW COMPARISON:  12/24/2016. FINDINGS: Mediastinum hilar structures are normal. Left mid lung and left lung base infiltrate. No pleural effusion or pneumothorax. Thoracic spine scoliosis and degenerative change. Surgical clips right upper quadrant. IMPRESSION: Left mid lung and left lung base infiltrate consistent with pneumonia. Electronically Signed   By: Marcello Moores  Register   On: 03/28/2017 10:03    Procedures Procedures (including critical care time)  Medications Ordered in ED Medications  albuterol (PROVENTIL,VENTOLIN) solution continuous neb (10 mg/hr Nebulization New Bag/Given 03/28/17 0859)  ceFEPIme (MAXIPIME) 1 g in sodium chloride 0.9 % 100 mL IVPB (not administered)  vancomycin (VANCOCIN) 1,500 mg in sodium chloride 0.9 % 500 mL IVPB (not administered)  methylPREDNISolone sodium succinate (SOLU-MEDROL) 125 mg/2 mL injection 125 mg (125 mg Intravenous Given 03/28/17 0853)   Pulse oximetry 88% room air abnormal   Initial Impression / Assessment and Plan / ED Course  I have reviewed the triage vital signs and the nursing notes.  Pertinent labs & imaging results that were available during my care of the patient were reviewed by me and considered in my medical decision making (see chart for details).  Update:, Patient in similar condition, dependent on oxygen, whereas she does not use home oxygen. Breathing improved, slightly.  On repeat exam the patient is in similar condition, with increased work of breathing. Patient has received continuous albuterol, continues to require supplemental oxygen. X-ray consistent with pneumonia, and given the patient's COPD, ongoing increased work of breathing, though she has received steroids, multiple breathing treatments, she will require admission for further evaluation and management. In the emergency department the patient has received broad-spectrum antibiotics,  cefepime, vancomycin, and multiple breathing treatments including a continuous nebulizer session.  Influenza panel pending on admission.  Final Clinical Impressions(s) / ED Diagnoses  COPD exacerbation Hypoxia  CRITICAL CARE Performed by: Carmin Muskrat Total critical care time: 35 minutes Critical care time was exclusive of separately billable procedures and treating other patients. Critical care was necessary to treat or prevent imminent or life-threatening deterioration. Critical care was time spent personally by me on the following activities: development of treatment plan with patient and/or surrogate as well as nursing, discussions with consultants, evaluation of patient's response to treatment, examination of patient, obtaining history from patient or surrogate, ordering and performing treatments and interventions, ordering and review of laboratory studies, ordering and review of radiographic studies, pulse oximetry and re-evaluation of patient's condition.     Carmin Muskrat, MD 03/28/17 1101

## 2017-03-28 NOTE — ED Notes (Signed)
ED TO INPATIENT HANDOFF REPORT  Name/Age/Gender Breanna Dixon 72 y.o. female  Code Status    Code Status Orders  (From admission, onward)        Start     Ordered   03/28/17 1142  Full code  Continuous     03/28/17 1142    Code Status History    Date Active Date Inactive Code Status Order ID Comments User Context   09/26/2016 17:52 09/28/2016 19:06 Full Code [id number]  Neldon [place] Inpatient   07/15/2014 12:22 07/20/2014 17:18 Full Code [id number]  Gaye Pollack, MD Inpatient   01/04/2012 20:51 01/12/2012 17:51 Full Code [id number]  Theressa Millard, MD ED      Home/SNF/Other Home  Chief Complaint n  Level of Care/Admitting Diagnosis ED Disposition    ED Disposition Condition Comment   Admit  Hospital Area: [facility] [[id number]]  Level of Care: Med-Surg [16]  Diagnosis: PNA (pneumonia) [893734]  Admitting Physician: Phillips Grout [[id number]]  Attending Physician: Derrill Kay A [[id number]]  PT Class (Do Not Modify): Observation [104]  PT Acc Code (Do Not Modify): Observation [10022]       Medical History Past Medical History:  Diagnosis Date  . Anxiety    takes Ativan daily, Panic attack   . Arthritis   . Bleeding ulcer 1974  . Breast cancer ([place])    right breast 2000  . Cancer [facility])    Surgery only, Lung Cancer- surgery only  . COPD (chronic obstructive pulmonary disease) ([place])   . Depression   . Fibromyalgia    takes Lyrica daily  . Head injury with loss of consciousness ([place])   . Headache(784.0)    takes Tegretol and Verapamil nightly  . History of blood transfusion    no abnormal reaction noted  . History of bronchitis   . History of colon polyps    benign  . History of kidney stones    several  . History of ulcer disease    pyloric   . Hyperlipidemia    takes Fenofibrate daily  . Hypertension    takes Toprol daily  . Hypertension   . Joint pain   . Lung mass April 2013   Right lower lobe lung mass  . Lupus     takes Cymbalta,Plaquenil  daily  . Pneumonia    hx of  . PTSD (post-traumatic stress disorder)    takes Cymbalta daily  . Pulmonary embolism ([place]) 2006   IVC filter  . Seasonal allergies    flonase prn  . Vertigo    takes Antivert prn    Allergies Allergies  Allergen Reactions  . Imitrex [Sumatriptan Base] Nausea And Vomiting    Rapid heart beat.  [name] Ketorolac Tromethamine Nausea And Vomiting    Rapid heart beat.  . Stadol [Butorphanol Tartrate] Nausea And Vomiting  . Ivp Dye [Iodinated Diagnostic Agents] Nausea Only and Palpitations    Can tolerate iodine  . Shellfish Allergy Nausea Only, Swelling and Palpitations    IV Location/Drains/Wounds Patient Lines/Drains/Airways Status   Active Line/Drains/Airways    Name:   Placement date:   Placement time:   Site:   Days:   Peripheral IV 03/28/17 Right Arm   03/28/17    1333    Arm   less than 1   Incision (Closed) 07/15/14 Chest Right   07/15/14    0729     987   Incision (Closed) 09/26/16 Hip Left   09/26/16  1418     183          Labs/Imaging Results for orders placed or performed during the hospital encounter of 03/28/17 (from the past 48 hour(s))  Comprehensive metabolic panel     Status: Abnormal   Collection Time: 03/28/17  8:35 AM  Result Value Ref Range   Sodium 138 135 - 145 mmol/L   Potassium 3.3 (L) 3.5 - 5.1 mmol/L   Chloride 103 101 - 111 mmol/L   CO2 21 (L) 22 - 32 mmol/L   Glucose, Bld 140 (H) 65 - 99 mg/dL   BUN 11 6 - 20 mg/dL   Creatinine, Ser 0.64 0.44 - 1.00 mg/dL   Calcium 8.5 (L) 8.9 - 10.3 mg/dL   Total Protein 6.9 6.5 - 8.1 g/dL   Albumin 3.5 3.5 - 5.0 g/dL   AST 35 15 - 41 U/L   ALT 17 14 - 54 U/L   Alkaline Phosphatase 63 38 - 126 U/L   Total Bilirubin 0.9 0.3 - 1.2 mg/dL   GFR calc non Af Amer >60 >60 mL/min   GFR calc Af Amer >60 >60 mL/min    Comment: (NOTE) The eGFR has been calculated using the CKD EPI equation. This calculation has not been validated in all clinical  situations. eGFR's persistently <60 mL/min signify possible Chronic Kidney Disease.    Anion gap 14 5 - 15    Comment: Performed at [facility], [place] [address], [place], [place] [address]  CBC WITH DIFFERENTIAL     Status: Abnormal   Collection Time: 03/28/17  8:35 AM  Result Value Ref Range   WBC 7.9 4.0 - 10.5 K/uL   RBC 4.40 3.87 - 5.11 MIL/uL   Hemoglobin 12.3 12.0 - 15.0 g/dL   HCT 37.9 36.0 - 46.0 %   MCV 86.1 78.0 - 100.0 fL   MCH 28.0 26.0 - 34.0 pg   MCHC 32.5 30.0 - 36.0 g/dL   RDW 15.0 11.5 - 15.5 %   Platelets 197 150 - 400 K/uL   Neutrophils Relative % 88 %   Neutro Abs 7.0 1.7 - 7.7 K/uL   Lymphocytes Relative 6 %   Lymphs Abs 0.5 (L) 0.7 - 4.0 K/uL   Monocytes Relative 6 %   Monocytes Absolute 0.4 0.1 - 1.0 K/uL   Eosinophils Relative 0 %   Eosinophils Absolute 0.0 0.0 - 0.7 K/uL   Basophils Relative 0 %   Basophils Absolute 0.0 0.0 - 0.1 K/uL    Comment: Performed at [facility], [place] [address] [place], [place] [address]  Troponin I (MHP)     Status: None   Collection Time: 03/28/17  8:35 AM  Result Value Ref Range   Troponin I <0.03 <0.03 ng/mL    Comment: Performed at [facility], [place] [address] [place], [place] [address]  Brain natriuretic peptide     Status: None   Collection Time: 03/28/17  8:36 AM  Result Value Ref Range   B Natriuretic Peptide 21.5 0.0 - 100.0 pg/mL    Comment: Performed at [facility], [place] [address], [place], [place] [address]   Dg Chest 2 View  Result Date: 03/28/2017 CLINICAL DATA:  Severe shortness of breath.  Wheezing. EXAM: CHEST  2 VIEW COMPARISON:  12/24/2016. FINDINGS: Mediastinum hilar structures are normal. Left mid lung and left lung base infiltrate. No pleural effusion or pneumothorax. Thoracic spine scoliosis and degenerative change. Surgical clips right upper quadrant.  IMPRESSION: Left mid lung and left lung base infiltrate consistent with  pneumonia. Electronically Signed   By: Marcello Moores  Register   On: 03/28/2017 10:03    Pending Labs Unresulted Labs (From admission, onward)   Start     Ordered   03/29/17 1017  Basic metabolic panel  Tomorrow morning,   R     03/28/17 1142   03/29/17 0500  CBC WITH DIFFERENTIAL  Tomorrow morning,   R     03/28/17 1142   03/28/17 1141  Culture, blood (routine x 2) Call MD if unable to obtain prior to antibiotics being given  BLOOD CULTURE X 2,   R    Comments:  If blood cultures drawn in Emergency Department - Do not draw and cancel order    03/28/17 1142   03/28/17 1141  Culture, sputum-assessment  Once,   R     03/28/17 1142   03/28/17 1141  Gram stain  Once,   R     03/28/17 1142   03/28/17 1141  Strep pneumoniae urinary antigen  Once,   R     03/28/17 1142   03/28/17 1019  Influenza panel by PCR (type A & B)  (Influenza PCR Panel)  Once,   R     03/28/17 1018      Vitals/Pain Today's Vitals   03/28/17 1106 03/28/17 1121 03/28/17 1123 03/28/17 1125  BP:  129/66    Pulse: 91 91    Resp:      Temp:      TempSrc:      SpO2: 96% (!) 87%    Weight:    214 lb (97.1 kg)  Height:    '5\' 8"'$  (1.727 m)  PainSc:   7      Isolation Precautions No active isolations  Medications Medications  furosemide (LASIX) tablet 20 mg (not administered)  metoprolol succinate (TOPROL-XL) 24 hr tablet 100 mg (not administered)  DULoxetine (CYMBALTA) DR capsule 120 mg (not administered)  hydroxychloroquine (PLAQUENIL) tablet 200 mg (not administered)  verapamil (COVERA HS) 24 hr tablet 240 mg (not administered)  pregabalin (LYRICA) capsule 100-200 mg (not administered)  QUEtiapine (SEROQUEL) tablet 50 mg (not administered)  clonazePAM (KLONOPIN) tablet 1 mg (not administered)  Brexpiprazole TABS 2 mg (not administered)  carbamazepine (TEGRETOL XR) 12 hr tablet 100 mg (not administered)  albuterol (PROVENTIL) (2.5 MG/3ML) 0.083% nebulizer solution 2.5 mg (not administered)  sodium chloride flush  (NS) 0.9 % injection 3 mL (not administered)  sodium chloride flush (NS) 0.9 % injection 3 mL (not administered)  0.9 %  sodium chloride infusion (not administered)  cefTRIAXone (ROCEPHIN) 1 g in sodium chloride 0.9 % 100 mL IVPB (not administered)  azithromycin (ZITHROMAX) 500 mg in sodium chloride 0.9 % 250 mL IVPB (not administered)  ipratropium-albuterol (DUONEB) 0.5-2.5 (3) MG/3ML nebulizer solution 3 mL (not administered)  methylPREDNISolone sodium succinate (SOLU-MEDROL) 125 mg/2 mL injection 125 mg (125 mg Intravenous Given 03/28/17 0853)  azithromycin (ZITHROMAX) 500 mg in sodium chloride 0.9 % 250 mL IVPB (0 mg Intravenous Stopped 03/28/17 1226)  cefTRIAXone (ROCEPHIN) 1 g in sodium chloride 0.9 % 100 mL IVPB (0 g Intravenous Stopped 03/28/17 1156)    Mobility walks with person assist

## 2017-03-28 NOTE — H&P (Signed)
History and Physical    Breanna Dixon BJS:[id number] DOB: 09-30-1945 DOA: 03/28/2017  PCP: Vicenta Aly, FNP  Patient coming from: Home  Chief Complaint: Shortness of breath and cough  HPI: Breanna Dixon is a 72 y.o. female with medical history significant of COPD, fibromyalgia, hypertension comes in with over a week of flulike symptoms with persistent fevers and cough.  Her son was just recently also with the same illness couple of days ago in the emergency department has been home.  She thinks she caught something from him.  She denies any nausea vomiting or diarrhea.  She does smoke but she has not smoked in about a week.  She is been having a lot of nasal congestion also.  Patient found to have infiltrate on chest x-ray and referred for admission for pneumonia.  She is also found to have O2 sats of 88% on room air and she does not have a submental oxygen requirement at home.  Patient has not been recently hospitalized or recently on antibiotics.  She is feeling better since arrival to the emergency department.  Review of Systems: As per HPI otherwise 10 point review of systems negative.   Past Medical History:  Diagnosis Date  . Anxiety    takes Ativan daily, Panic attack   . Arthritis   . Bleeding ulcer 1974  . Breast cancer ([place])    right breast 2000  . Cancer [facility])    Surgery only, Lung Cancer- surgery only  . COPD (chronic obstructive pulmonary disease) ([place])   . Depression   . Fibromyalgia    takes Lyrica daily  . Head injury with loss of consciousness ([place])   . Headache(784.0)    takes Tegretol and Verapamil nightly  . History of blood transfusion    no abnormal reaction noted  . History of bronchitis   . History of colon polyps    benign  . History of kidney stones    several  . History of ulcer disease    pyloric   . Hyperlipidemia    takes Fenofibrate daily  . Hypertension    takes Toprol daily  . Hypertension   . Joint pain   . Lung mass April 2013     Right lower lobe lung mass  . Lupus    takes Cymbalta,Plaquenil  daily  . Pneumonia    hx of  . PTSD (post-traumatic stress disorder)    takes Cymbalta daily  . Pulmonary embolism ([place]) 2006   IVC filter  . Seasonal allergies    flonase prn  . Vertigo    takes Antivert prn    Past Surgical History:  Procedure Laterality Date  . ABDOMINAL HYSTERECTOMY  1992  . APPENDECTOMY  1965  . BACK SURGERY  2000/2010   fusion  . BREAST EXCISIONAL BIOPSY    . BREAST LUMPECTOMY    . BREAST SURGERY     bilateral partial masectomy  . CARDIAC CATHETERIZATION  1992   no PCI  . CHOLECYSTECTOMY  1994  . COLONOSCOPY    . CYSTOSCOPY     multiple  . DILATION AND CURETTAGE OF UTERUS    . HERNIA REPAIR    . INCISIONAL HERNIA REPAIR    . IVC filter    . JOINT REPLACEMENT Bilateral    Knees  . KNEE ARTHROSCOPY Bilateral   . MULTIPLE TOOTH EXTRACTIONS  1990  . THORACOTOMY/LOBECTOMY Right 07/15/2014   Procedure: RIGHT THORACOTOMY/RIGHT LOWER LOBE  LOBECTOMY;  Surgeon: Gaye Pollack, MD;  Location: [facility];  Service: Thoracic;  Laterality: Right;  . TONSILLECTOMY  1967  . TOTAL HIP ARTHROPLASTY Left 09/26/2016   Procedure: TOTAL HIP ARTHROPLASTY ANTERIOR APPROACH;  Surgeon: Frederik Pear, MD;  Location: [place];  Service: Orthopedics;  Laterality: Left;  . TOTAL KNEE ARTHROPLASTY  06/04/2011   Procedure: TOTAL KNEE ARTHROPLASTY;  Surgeon: Rudean Haskell, MD;  Location: [place];  Service: Orthopedics;  Laterality: Right;  . TOTAL KNEE ARTHROPLASTY  10/15/2011   Procedure: TOTAL KNEE ARTHROPLASTY;  Surgeon: Rudean Haskell, MD;  Location: [place];  Service: Orthopedics;  Laterality: Left;  . [place]  . VENA CAVA FILTER PLACEMENT  2006  . VIDEO BRONCHOSCOPY WITH ENDOBRONCHIAL NAVIGATION N/A 06/02/2014   Procedure: VIDEO BRONCHOSCOPY WITH ENDOBRONCHIAL NAVIGATION;  Surgeon: Collene Gobble, MD;  Location: [place];  Service: Thoracic;  Laterality: N/A;     reports that she has been smoking  cigarettes.  She has been smoking about 1.00 pack per day. she has never used smokeless tobacco. She reports that she does not drink alcohol or use drugs.  Allergies  Allergen Reactions  . Imitrex [Sumatriptan Base] Nausea And Vomiting    Rapid heart beat.  [name] Ketorolac Tromethamine Nausea And Vomiting    Rapid heart beat.  . Stadol [Butorphanol Tartrate] Nausea And Vomiting  . Ivp Dye [Iodinated Diagnostic Agents] Nausea Only and Palpitations    Can tolerate iodine  . Shellfish Allergy Nausea Only, Swelling and Palpitations    Family History  Adopted: Yes  Problem Relation Age of Onset  . Anesthesia problems Neg Hx   . Hypotension Neg Hx   . Malignant hyperthermia Neg Hx   . Pseudochol deficiency Neg Hx     Prior to Admission medications   Medication Sig Start Date End Date Taking? Authorizing Provider  albuterol (PROVENTIL) (2.5 MG/3ML) 0.083% nebulizer solution Take 3 mLs (2.5 mg total) by nebulization every 6 (six) hours as needed for wheezing or shortness of breath. 03/25/17  Yes Jaynee Eagles, PA-C  Brexpiprazole (REXULTI) 2 MG TABS Take 2 mg by mouth daily.   Yes [provider]  carbamazepine (TEGRETOL XR) 100 MG 12 hr tablet Take 100 mg by mouth 3 (three) times daily.   Yes [provider]  clonazePAM (KLONOPIN) 1 MG tablet Take 1 tablet by mouth at bedtime.   Yes [provider]  DULoxetine (CYMBALTA) 60 MG capsule Take 120 mg by mouth at bedtime.    Yes [provider]  fenofibrate (TRICOR) 145 MG tablet Take 1 tablet by mouth daily.   Yes [provider]  furosemide (LASIX) 20 MG tablet Take 1 tablet (20 mg total) by mouth daily. 01/12/12  Yes Oswald Hillock, MD  Glycopyrrolate-Formoterol (BEVESPI AEROSPHERE) 9-4.8 MCG/ACT AERO Inhale 2 puffs into the lungs 2 (two) times daily. 01/10/17  Yes Young, Tarri Fuller D, MD  hydroxychloroquine (PLAQUENIL) 200 MG tablet Take 200 mg by mouth 2 (two) times daily.   Yes [provider]    Ipratropium-Albuterol (COMBIVENT RESPIMAT) 20-100 MCG/ACT AERS respimat INHALE TWO PUFFS INTO LUNGS  EVERY 6 HOURS AS NEEDED FOR SHORTNESS OF BREATH 04/06/15  Yes Young, Clinton D, MD  metoprolol succinate (TOPROL-XL) 100 MG 24 hr tablet Take 1 tablet (100 mg total) by mouth daily. Take with or immediately following a meal. 01/12/12  Yes Darrick Meigs, Marge Duncans, MD  predniSONE (DELTASONE) 20 MG tablet Take 2 tablets daily with breakfast. 03/25/17  Yes Jaynee Eagles, PA-C  pregabalin (LYRICA) 100 MG  capsule Take 100-200 mg by mouth 2 (two) times daily. 1 tab in the am and 2 tabs at night.    Yes [provider]  QUEtiapine (SEROQUEL) 50 MG tablet Take 50 mg by mouth at bedtime.   Yes [provider]  ranitidine (ZANTAC) 150 MG capsule Take 150 mg by mouth 2 (two) times daily.   Yes [provider]  verapamil (COVERA HS) 240 MG (CO) 24 hr tablet Take 240 mg by mouth at bedtime.   Yes [provider]  azithromycin (ZITHROMAX) 250 MG tablet Start with 2 tablets today, then 1 daily thereafter. Patient not taking: Reported on 03/28/2017 03/25/17   Jaynee Eagles, PA-C  benzonatate (TESSALON) 100 MG capsule Take 1-2 capsules (100-200 mg total) by mouth 3 (three) times daily as needed for cough. Patient not taking: Reported on 03/28/2017 03/25/17   Jaynee Eagles, PA-C    Physical Exam: Vitals:   03/28/17 0930 03/28/17 1106 03/28/17 1121 03/28/17 1125  BP: (!) 134/47  129/66   Pulse:  91 91   Resp: 20     Temp:      TempSrc:      SpO2:  96% (!) 87%   Weight:    97.1 kg (214 lb)  Height:    5\' 8"  (1.727 m)      Constitutional: NAD, calm, comfortable Vitals:   03/28/17 0930 03/28/17 1106 03/28/17 1121 03/28/17 1125  BP: (!) 134/47  129/66   Pulse:  91 91   Resp: 20     Temp:      TempSrc:      SpO2:  96% (!) 87%   Weight:    97.1 kg (214 lb)  Height:    5\' 8"  (1.727 m)   Eyes: PERRL, lids and conjunctivae normal ENMT: Mucous membranes are moist. Posterior pharynx clear of  any exudate or lesions.Normal dentition.  Neck: normal, supple, no masses, no thyromegaly Respiratory: clear to auscultation bilaterally, no wheezing, no crackles. Normal respiratory effort. No accessory muscle use.  Cardiovascular: Regular rate and rhythm, no murmurs / rubs / gallops. No extremity edema. 2+ pedal pulses. No carotid bruits.  Abdomen: no tenderness, no masses palpated. No hepatosplenomegaly. Bowel sounds positive.  Musculoskeletal: no clubbing / cyanosis. No joint deformity upper and lower extremities. Good ROM, no contractures. Normal muscle tone.  Skin: no rashes, lesions, ulcers. No induration Neurologic: CN 2-12 grossly intact. Sensation intact, DTR normal. Strength 5/5 in all 4.  Psychiatric: Normal judgment and insight. Alert and oriented x 3. Normal mood.    Labs on Admission: I have personally reviewed following labs and imaging studies  CBC: Recent Labs  Lab 03/28/17 0835  WBC 7.9  NEUTROABS 7.0  HGB 12.3  HCT 37.9  MCV 86.1  PLT 789   Basic Metabolic Panel: Recent Labs  Lab 03/28/17 0835  NA 138  K 3.3*  CL 103  CO2 21*  GLUCOSE 140*  BUN 11  CREATININE 0.64  CALCIUM 8.5*   GFR: Estimated Creatinine Clearance: 80.9 mL/min (by C-G formula based on SCr of 0.64 mg/dL). Liver Function Tests: Recent Labs  Lab 03/28/17 0835  AST 35  ALT 17  ALKPHOS 63  BILITOT 0.9  PROT 6.9  ALBUMIN 3.5   No results for input(s): LIPASE, AMYLASE in the last 168 hours. No results for input(s): AMMONIA in the last 168 hours. Coagulation Profile: No results for input(s): INR, PROTIME in the last 168 hours. Cardiac Enzymes: Recent Labs  Lab 03/28/17 0835  TROPONINI <  0.03   BNP (last 3 results) No results for input(s): PROBNP in the last 8760 hours. HbA1C: No results for input(s): HGBA1C in the last 72 hours. CBG: No results for input(s): GLUCAP in the last 168 hours. Lipid Profile: No results for input(s): CHOL, HDL, LDLCALC, TRIG, CHOLHDL, LDLDIRECT  in the last 72 hours. Thyroid Function Tests: No results for input(s): TSH, T4TOTAL, FREET4, T3FREE, THYROIDAB in the last 72 hours. Anemia Panel: No results for input(s): VITAMINB12, FOLATE, FERRITIN, TIBC, IRON, RETICCTPCT in the last 72 hours. Urine analysis:    Component Value Date/Time   COLORURINE YELLOW 09/20/2016 1552   APPEARANCEUR CLEAR 09/20/2016 1552   LABSPEC 1.015 09/20/2016 1552   PHURINE 5.0 09/20/2016 1552   GLUCOSEU NEGATIVE 09/20/2016 1552   HGBUR NEGATIVE 09/20/2016 1552   BILIRUBINUR NEGATIVE 09/20/2016 1552   KETONESUR NEGATIVE 09/20/2016 1552   PROTEINUR NEGATIVE 09/20/2016 1552   UROBILINOGEN 0.2 10/23/2014 0355   NITRITE NEGATIVE 09/20/2016 1552   LEUKOCYTESUR NEGATIVE 09/20/2016 1552   Sepsis Labs: !!!!!!!!!!!!!!!!!!!!!!!!!!!!!!!!!!!!!!!!!!!! @LABRCNTIP (procalcitonin:4,lacticidven:4) )No results found for this or any previous visit (from the past 240 hour(s)).   Radiological Exams on Admission: Dg Chest 2 View  Result Date: 03/28/2017 CLINICAL DATA:  Severe shortness of breath.  Wheezing. EXAM: CHEST  2 VIEW COMPARISON:  12/24/2016. FINDINGS: Mediastinum hilar structures are normal. Left mid lung and left lung base infiltrate. No pleural effusion or pneumothorax. Thoracic spine scoliosis and degenerative change. Surgical clips right upper quadrant. IMPRESSION: Left mid lung and left lung base infiltrate consistent with pneumonia. Electronically Signed   By: Marcello Moores  Register   On: 03/28/2017 10:03    Old chart reviewed Case discussed with Dr. Vanita Panda  Assessment/Plan 72 year old female with history of COPD comes in with pneumonia and mild hypoxia Principal Problem:   PNA (pneumonia)-placed on IV Rocephin and azithromycin.  Obtain blood and sputum cultures.  Supplemental oxygen as needed.  She is stable on 2 L of oxygen at this point.  Influenza is pending.  Symptomatic for a week however out of the window for Tamiflu.  Active Problems:   Acute  respiratory failure with hypoxia (HCC)-due to the above very mild at 88% on room air wean as tolerates.    COPD mixed type (HCC)-hold off on steroids at this time.  She is not wheezing    Tobacco abuse-quit a week ago    S/P lobectomy of lung-carcinoid 2016= noted    DVT prophylaxis: SCDs Code Status: Full Family Communication: None Disposition Plan: Per day team Consults called: None Admission status: Observation   Velecia Ovitt A MD [facility]  If 7PM-7AM, please contact night-coverage [email or web address] Password [facility]  03/28/2017, 11:39 AM

## 2017-03-29 DIAGNOSIS — E785 Hyperlipidemia, unspecified: Secondary | ICD-10-CM | POA: Diagnosis present

## 2017-03-29 DIAGNOSIS — Z7952 Long term (current) use of systemic steroids: Secondary | ICD-10-CM | POA: Diagnosis not present

## 2017-03-29 DIAGNOSIS — R0902 Hypoxemia: Secondary | ICD-10-CM | POA: Diagnosis not present

## 2017-03-29 DIAGNOSIS — Z720 Tobacco use: Secondary | ICD-10-CM | POA: Diagnosis not present

## 2017-03-29 DIAGNOSIS — Z96642 Presence of left artificial hip joint: Secondary | ICD-10-CM | POA: Diagnosis present

## 2017-03-29 DIAGNOSIS — J181 Lobar pneumonia, unspecified organism: Secondary | ICD-10-CM | POA: Diagnosis not present

## 2017-03-29 DIAGNOSIS — J441 Chronic obstructive pulmonary disease with (acute) exacerbation: Secondary | ICD-10-CM | POA: Diagnosis present

## 2017-03-29 DIAGNOSIS — J101 Influenza due to other identified influenza virus with other respiratory manifestations: Secondary | ICD-10-CM | POA: Diagnosis not present

## 2017-03-29 DIAGNOSIS — Z79899 Other long term (current) drug therapy: Secondary | ICD-10-CM | POA: Diagnosis not present

## 2017-03-29 DIAGNOSIS — J189 Pneumonia, unspecified organism: Secondary | ICD-10-CM | POA: Diagnosis not present

## 2017-03-29 DIAGNOSIS — Z96653 Presence of artificial knee joint, bilateral: Secondary | ICD-10-CM | POA: Diagnosis present

## 2017-03-29 DIAGNOSIS — M940 Chondrocostal junction syndrome [Tietze]: Secondary | ICD-10-CM | POA: Diagnosis present

## 2017-03-29 DIAGNOSIS — F17210 Nicotine dependence, cigarettes, uncomplicated: Secondary | ICD-10-CM | POA: Diagnosis present

## 2017-03-29 DIAGNOSIS — R42 Dizziness and giddiness: Secondary | ICD-10-CM | POA: Diagnosis present

## 2017-03-29 DIAGNOSIS — R0602 Shortness of breath: Secondary | ICD-10-CM | POA: Diagnosis present

## 2017-03-29 DIAGNOSIS — J1008 Influenza due to other identified influenza virus with other specified pneumonia: Secondary | ICD-10-CM | POA: Diagnosis present

## 2017-03-29 DIAGNOSIS — Z853 Personal history of malignant neoplasm of breast: Secondary | ICD-10-CM | POA: Diagnosis not present

## 2017-03-29 DIAGNOSIS — F4310 Post-traumatic stress disorder, unspecified: Secondary | ICD-10-CM | POA: Diagnosis present

## 2017-03-29 DIAGNOSIS — I10 Essential (primary) hypertension: Secondary | ICD-10-CM | POA: Diagnosis present

## 2017-03-29 DIAGNOSIS — Z888 Allergy status to other drugs, medicaments and biological substances status: Secondary | ICD-10-CM | POA: Diagnosis not present

## 2017-03-29 DIAGNOSIS — J9601 Acute respiratory failure with hypoxia: Secondary | ICD-10-CM | POA: Diagnosis present

## 2017-03-29 DIAGNOSIS — M797 Fibromyalgia: Secondary | ICD-10-CM | POA: Diagnosis present

## 2017-03-29 DIAGNOSIS — F329 Major depressive disorder, single episode, unspecified: Secondary | ICD-10-CM | POA: Diagnosis present

## 2017-03-29 DIAGNOSIS — J440 Chronic obstructive pulmonary disease with acute lower respiratory infection: Secondary | ICD-10-CM | POA: Diagnosis present

## 2017-03-29 DIAGNOSIS — Z85118 Personal history of other malignant neoplasm of bronchus and lung: Secondary | ICD-10-CM | POA: Diagnosis not present

## 2017-03-29 DIAGNOSIS — J302 Other seasonal allergic rhinitis: Secondary | ICD-10-CM | POA: Diagnosis present

## 2017-03-29 DIAGNOSIS — J129 Viral pneumonia, unspecified: Secondary | ICD-10-CM | POA: Diagnosis present

## 2017-03-29 DIAGNOSIS — E876 Hypokalemia: Secondary | ICD-10-CM | POA: Diagnosis present

## 2017-03-29 DIAGNOSIS — Z902 Acquired absence of lung [part of]: Secondary | ICD-10-CM | POA: Diagnosis not present

## 2017-03-29 DIAGNOSIS — F410 Panic disorder [episodic paroxysmal anxiety] without agoraphobia: Secondary | ICD-10-CM | POA: Diagnosis present

## 2017-03-29 LAB — MRSA PCR SCREENING: MRSA BY PCR: NEGATIVE

## 2017-03-29 LAB — CBC WITH DIFFERENTIAL/PLATELET
BASOS PCT: 0 %
Basophils Absolute: 0 10*3/uL (ref 0.0–0.1)
EOS ABS: 0 10*3/uL (ref 0.0–0.7)
Eosinophils Relative: 0 %
HCT: 35.3 % — ABNORMAL LOW (ref 36.0–46.0)
HEMOGLOBIN: 11.2 g/dL — AB (ref 12.0–15.0)
Lymphocytes Relative: 13 %
Lymphs Abs: 1.2 10*3/uL (ref 0.7–4.0)
MCH: 27.3 pg (ref 26.0–34.0)
MCHC: 31.7 g/dL (ref 30.0–36.0)
MCV: 86.1 fL (ref 78.0–100.0)
MONOS PCT: 11 %
Monocytes Absolute: 1 10*3/uL (ref 0.1–1.0)
NEUTROS PCT: 76 %
Neutro Abs: 7 10*3/uL (ref 1.7–7.7)
Platelets: 206 10*3/uL (ref 150–400)
RBC: 4.1 MIL/uL (ref 3.87–5.11)
RDW: 15 % (ref 11.5–15.5)
WBC: 9.1 10*3/uL (ref 4.0–10.5)

## 2017-03-29 LAB — BASIC METABOLIC PANEL
Anion gap: 13 (ref 5–15)
BUN: 16 mg/dL (ref 6–20)
CALCIUM: 8.6 mg/dL — AB (ref 8.9–10.3)
CO2: 22 mmol/L (ref 22–32)
CREATININE: 0.7 mg/dL (ref 0.44–1.00)
Chloride: 105 mmol/L (ref 101–111)
GFR calc non Af Amer: >60 mL/min (ref >60)
Glucose, Bld: 163 mg/dL — ABNORMAL HIGH (ref 65–99)
Potassium: 3 mmol/L — ABNORMAL LOW (ref 3.5–5.1)
SODIUM: 140 mmol/L (ref 135–145)

## 2017-03-29 LAB — STREP PNEUMONIAE URINARY ANTIGEN: STREP PNEUMO URINARY ANTIGEN: NEGATIVE

## 2017-03-29 MED ORDER — IPRATROPIUM-ALBUTEROL 0.5-2.5 (3) MG/3ML IN SOLN
3.0000 mL | RESPIRATORY_TRACT | Status: DC | PRN
  Administered 2017-03-31 – 2017-04-02 (×2): 3 mL via RESPIRATORY_TRACT
  Filled 2017-03-29 (×2): qty 3

## 2017-03-29 MED ORDER — ACETAMINOPHEN 325 MG PO TABS
650.0000 mg | ORAL_TABLET | Freq: Four times a day (QID) | ORAL | Status: DC | PRN
  Administered 2017-03-30 (×2): 650 mg via ORAL
  Filled 2017-03-29: qty 2

## 2017-03-29 MED ORDER — POTASSIUM CHLORIDE CRYS ER 20 MEQ PO TBCR
40.0000 meq | EXTENDED_RELEASE_TABLET | ORAL | Status: AC
  Administered 2017-03-29 (×2): 40 meq via ORAL
  Filled 2017-03-29 (×2): qty 2

## 2017-03-29 MED ORDER — GUAIFENESIN-DM 100-10 MG/5ML PO SYRP
5.0000 mL | ORAL_SOLUTION | Freq: Four times a day (QID) | ORAL | Status: DC
  Administered 2017-03-29 – 2017-04-02 (×17): 5 mL via ORAL
  Filled 2017-03-29 (×17): qty 10

## 2017-03-29 MED ORDER — ONDANSETRON HCL 4 MG/2ML IJ SOLN
4.0000 mg | Freq: Four times a day (QID) | INTRAMUSCULAR | Status: DC | PRN
  Administered 2017-03-29 – 2017-03-31 (×3): 4 mg via INTRAVENOUS
  Filled 2017-03-29 (×3): qty 2

## 2017-03-29 MED ORDER — METHYLPREDNISOLONE SODIUM SUCC 40 MG IJ SOLR
40.0000 mg | Freq: Three times a day (TID) | INTRAMUSCULAR | Status: AC
  Administered 2017-03-29 – 2017-04-01 (×9): 40 mg via INTRAVENOUS
  Filled 2017-03-29 (×9): qty 1

## 2017-03-29 MED ORDER — IPRATROPIUM-ALBUTEROL 0.5-2.5 (3) MG/3ML IN SOLN
3.0000 mL | RESPIRATORY_TRACT | Status: DC
  Administered 2017-03-29 – 2017-03-30 (×5): 3 mL via RESPIRATORY_TRACT
  Filled 2017-03-29 (×5): qty 3

## 2017-03-29 MED ORDER — IBUPROFEN 400 MG PO TABS
400.0000 mg | ORAL_TABLET | Freq: Four times a day (QID) | ORAL | Status: DC | PRN
  Administered 2017-03-29 – 2017-03-30 (×2): 400 mg via ORAL
  Filled 2017-03-29 (×2): qty 1

## 2017-03-29 NOTE — Progress Notes (Signed)
Nutrition Brief Note  Patient identified on the Malnutrition Screening Tool (MST) Report  Wt Readings from Last 15 Encounters:  03/28/17 214 lb (97.1 kg)  01/10/17 218 lb 9.6 oz (99.2 kg)  12/24/16 210 lb (95.3 kg)  09/26/16 211 lb (95.7 kg)  09/10/16 211 lb 9.6 oz (96 kg)  04/06/15 244 lb 6.4 oz (110.9 kg)  11/04/14 231 lb 6.4 oz (105 kg)  09/24/14 234 lb 6 oz (106.3 kg)  09/08/14 227 lb 12.8 oz (103.3 kg)  08/11/14 235 lb (106.6 kg)  07/18/14 238 lb 12.1 oz (108.3 kg)  07/13/14 235 lb 11.2 oz (106.9 kg)  06/16/14 229 lb (103.9 kg)  06/08/14 229 lb (103.9 kg)  06/02/14 229 lb (103.9 kg)    Body mass index is 32.54 kg/m. Patient meets criteria for obesity based on current BMI. Weight has been stable over the past 6 months. Pt has lost 30 lbs (12% body weight) in the past ~2 years. This is not significant for time frame.   Current diet order is Heart Healthy. Medications reviewed; 20 mg oral Lasix/day.  Labs reviewed;  K: 3 mmol/L, Ca: 8.6 mg/dL.  No nutrition interventions warranted at this time. If nutrition issues arise, please consult RD.      Jarome Matin, MS, RD, LDN, [facility] Inpatient Clinical Dietitian Pager # [phone] After hours/weekend pager # [phone]

## 2017-03-29 NOTE — Progress Notes (Signed)
PROGRESS NOTE    Kelsey A Martinique  OAC:[id number] DOB: May 20, 1945 DOA: 03/28/2017 PCP: Vicenta Aly, FNP    Brief Narrative:  72 year old female who presented with dyspnea and cough.  She does have the significant past medical history for COPD, fibromyalgia and hypertension.  She had upper respiratory tract infection symptoms for the last 7 days prior to hospitalization, associated with fever and cough.  On the initial physical examination blood pressure 134/47, heart rate 91, respiratory rate 20, oxygen saturation 87%.  Moist mucous membranes, lungs were clear to auscultation bilaterally, heart S1-S2 present rhythmic, no gallops, rubs or murmurs, the abdomen was soft nontender, no lower extremity edema.  Sodium 138, potassium 3.3, chloride 103, bicarb 21, glucose 140, BUN 11, creatinine 0.64, white count 7.9, hemoglobin 12.3, hematocrit 37.9, platelets 197.  Influenza A+, chest x-ray with treated left lower lobe and lower zone of left upper lobe.  EKG normal sinus rhythm, normal axis, normal intervals.  Patient was admitted to the hospital with a working diagnosis of acute hypoxic respiratory failure due to influenza A complicated by left upper (lower zone) and left lower lobe pneumonia.  Assessment & Plan:   Principal Problem:   PNA (pneumonia) Active Problems:   Acute respiratory failure with hypoxia (HCC)   COPD mixed type ([place])   Tobacco abuse   S/P lobectomy of lung-carcinoid 2016   1.  Acute hypoxic respiratory failure due to multilobar pneumonia. Will continue antibiotic therapy with Azithromycin and Ceftriaxone, aggressive bronchodilator therapy, will hold on IV fluids to prevent volume overload.   2.  Acute influenza A. Continue Oseltamivir, continue to monitor cell count and temperature curve.   3.  COPD exacerbation. Likely exacerbated by influenza and bacterial pneumonia, will continue bronchodilator therapy, oxymetry monitoring, supplemental 02 per [place] and systemic  steroids. Cough suppressive therapy, will add ibuprofen for costochondritis.   4.  Tobacco abuse. Smoking cessation   5. Anxiety/ depression. Continue Carbamazepine, clonazepam, duloxetine, and seroquel.   6. HTN. Will continue blood pressure control with verapamil and metoprolol.   7. Hypokalemia. Will replete K with KCl, and will follow on electrolytes in am, renal function with serum cr at 0.70.    DVT prophylaxis: enoxaparin  Code Status:  full Family Communication: no family at the bedside Disposition Plan: hom   Consultants:     Procedures:     Antimicrobials:       Subjective: Patient with precordial chest pain, worse with deep inspiration, moderate to severe in intensity, for the last 30 minutes, improved with nebulizer tr, radiated across the chest. Positive cough and dyspnea.   Objective: Vitals:   03/29/17 0152 03/29/17 0509 03/29/17 1048 03/29/17 1145  BP:  138/60 (!) 117/42 (!) 127/48  Pulse:  91 87 87  Resp:  16 16 18   Temp:  99.3 F (37.4 C)  98.6 F (37 C)  TempSrc:  Oral Oral Oral  SpO2: 95% 96% 97% 96%  Weight:      Height:        Intake/Output Summary (Last 24 hours) at 03/29/2017 1217 Last data filed at 03/29/2017 0109 Gross per 24 hour  Intake 1165 ml  Output 200 ml  Net 965 ml   Filed Weights   03/28/17 1125  Weight: 97.1 kg (214 lb)    Examination:   General: Not in pain or dyspnea, deconditioned Neurology: Awake and alert, non focal  E ENT: positive  pallor, no icterus, oral mucosa moist Cardiovascular: No JVD. S1-S2 present, rhythmic, no gallops, rubs,  or murmurs. No lower extremity edema. Pulmonary: decreased breath sounds bilaterally, poor air movement, positive expiratory wheezing, and rhonchi, plus predominant rales at the right base. . Gastrointestinal. Abdomen mild distended, no organomegaly, non tender, no rebound or guarding Skin. No rashes Musculoskeletal: no joint deformities     Data Reviewed: I have  personally reviewed following labs and imaging studies  CBC: Recent Labs  Lab 03/28/17 0835 03/29/17 0526  WBC 7.9 9.1  NEUTROABS 7.0 7.0  HGB 12.3 11.2*  HCT 37.9 35.3*  MCV 86.1 86.1  PLT 197 371   Basic Metabolic Panel: Recent Labs  Lab 03/28/17 0835 03/29/17 0526  NA 138 140  K 3.3* 3.0*  CL 103 105  CO2 21* 22  GLUCOSE 140* 163*  BUN 11 16  CREATININE 0.64 0.70  CALCIUM 8.5* 8.6*   GFR: Estimated Creatinine Clearance: 80.9 mL/min (by C-G formula based on SCr of 0.7 mg/dL). Liver Function Tests: Recent Labs  Lab 03/28/17 0835  AST 35  ALT 17  ALKPHOS 63  BILITOT 0.9  PROT 6.9  ALBUMIN 3.5   No results for input(s): LIPASE, AMYLASE in the last 168 hours. No results for input(s): AMMONIA in the last 168 hours. Coagulation Profile: No results for input(s): INR, PROTIME in the last 168 hours. Cardiac Enzymes: Recent Labs  Lab 03/28/17 0835  TROPONINI <0.03   BNP (last 3 results) No results for input(s): PROBNP in the last 8760 hours. HbA1C: No results for input(s): HGBA1C in the last 72 hours. CBG: No results for input(s): GLUCAP in the last 168 hours. Lipid Profile: No results for input(s): CHOL, HDL, LDLCALC, TRIG, CHOLHDL, LDLDIRECT in the last 72 hours. Thyroid Function Tests: No results for input(s): TSH, T4TOTAL, FREET4, T3FREE, THYROIDAB in the last 72 hours. Anemia Panel: No results for input(s): VITAMINB12, FOLATE, FERRITIN, TIBC, IRON, RETICCTPCT in the last 72 hours.    Radiology Studies: I have reviewed all of the imaging during this hospital visit personally     Scheduled Meds: . Brexpiprazole  2 mg Oral Daily  . carbamazepine  100 mg Oral TID  . clonazePAM  1 mg Oral QHS  . DULoxetine  120 mg Oral QHS  . furosemide  20 mg Oral Daily  . hydroxychloroquine  200 mg Oral BID  . ipratropium-albuterol  3 mL Nebulization Q6H  . metoprolol succinate  100 mg Oral Daily  . pregabalin  100 mg Oral Daily   And  . pregabalin  200 mg  Oral QHS  . QUEtiapine  50 mg Oral QHS  . sodium chloride flush  3 mL Intravenous Q12H  . verapamil  240 mg Oral QHS   Continuous Infusions: . sodium chloride    . azithromycin 500 mg (03/29/17 1141)  . cefTRIAXone (ROCEPHIN)  IV Stopped (03/29/17 1105)     LOS: 0 days        Arrian Manson Gerome Apley, MD [facility] Pager [phone]

## 2017-03-29 NOTE — Progress Notes (Signed)
PT demonstrated hands on understanding of Flutter device- good cough.

## 2017-03-29 NOTE — Progress Notes (Signed)
Nurse paged provider. Provider is aware of patients BP 117/42, P 87. Give metoprolol succinate 100mg  per Dr. Cathlean Sauer. Nurse will give medication.

## 2017-03-29 NOTE — Progress Notes (Signed)
Infection prevention called nurse today and requested MRSA PCR be collected due to patient having history of MRSA. Per pt, no history of MRSA. MRSA, PCR sent.

## 2017-03-29 NOTE — Progress Notes (Signed)
Patient complaining of chest pain, described as "heavy", located at lower sternum, wraps around lowest left rib. Vital signs as follows: BP 127/48, T 98.6 oral, P 87, R 18, O2 96% 2LNC. Nurse paged Dr. Cathlean Sauer to make provider aware.

## 2017-03-30 LAB — CBC WITH DIFFERENTIAL/PLATELET
BASOS ABS: 0 10*3/uL (ref 0.0–0.1)
BASOS PCT: 0 %
Eosinophils Absolute: 0 10*3/uL (ref 0.0–0.7)
Eosinophils Relative: 0 %
HEMATOCRIT: 33.5 % — AB (ref 36.0–46.0)
HEMOGLOBIN: 10.6 g/dL — AB (ref 12.0–15.0)
LYMPHS PCT: 10 %
Lymphs Abs: 0.6 10*3/uL — ABNORMAL LOW (ref 0.7–4.0)
MCH: 27.7 pg (ref 26.0–34.0)
MCHC: 31.6 g/dL (ref 30.0–36.0)
MCV: 87.7 fL (ref 78.0–100.0)
Monocytes Absolute: 0.4 10*3/uL (ref 0.1–1.0)
Monocytes Relative: 6 %
NEUTROS ABS: 5.3 10*3/uL (ref 1.7–7.7)
NEUTROS PCT: 84 %
Platelets: 194 10*3/uL (ref 150–400)
RBC: 3.82 MIL/uL — ABNORMAL LOW (ref 3.87–5.11)
RDW: 15.3 % (ref 11.5–15.5)
WBC: 6.3 10*3/uL (ref 4.0–10.5)

## 2017-03-30 LAB — BASIC METABOLIC PANEL
ANION GAP: 15 (ref 5–15)
BUN: 12 mg/dL (ref 6–20)
CALCIUM: 8.8 mg/dL — AB (ref 8.9–10.3)
CO2: 21 mmol/L — AB (ref 22–32)
Chloride: 106 mmol/L (ref 101–111)
Creatinine, Ser: 0.53 mg/dL (ref 0.44–1.00)
GLUCOSE: 164 mg/dL — AB (ref 65–99)
POTASSIUM: 4.7 mmol/L (ref 3.5–5.1)
Sodium: 142 mmol/L (ref 135–145)

## 2017-03-30 MED ORDER — OSELTAMIVIR PHOSPHATE 75 MG PO CAPS
75.0000 mg | ORAL_CAPSULE | Freq: Every day | ORAL | Status: DC
  Administered 2017-03-30 – 2017-04-02 (×4): 75 mg via ORAL
  Filled 2017-03-30 (×4): qty 1

## 2017-03-30 MED ORDER — CLONAZEPAM 1 MG PO TABS
1.0000 mg | ORAL_TABLET | Freq: Two times a day (BID) | ORAL | Status: DC
  Administered 2017-03-30 – 2017-04-02 (×7): 1 mg via ORAL
  Filled 2017-03-30 (×7): qty 1

## 2017-03-30 MED ORDER — IPRATROPIUM-ALBUTEROL 0.5-2.5 (3) MG/3ML IN SOLN
3.0000 mL | Freq: Three times a day (TID) | RESPIRATORY_TRACT | Status: DC
  Administered 2017-03-30 – 2017-03-31 (×3): 3 mL via RESPIRATORY_TRACT
  Filled 2017-03-30 (×3): qty 3

## 2017-03-30 NOTE — Progress Notes (Signed)
PROGRESS NOTE    Breanna Dixon  HOZ:[id number] DOB: 1945/07/10 DOA: 03/28/2017 PCP: Vicenta Aly, FNP    Brief Narrative:  72 year old female who presented with dyspnea and cough.  She does have the significant past medical history for COPD, fibromyalgia and hypertension.  She had upper respiratory tract infection symptoms for the last 7 days prior to hospitalization, associated with fever and cough.  On the initial physical examination blood pressure 134/47, heart rate 91, respiratory rate 20, oxygen saturation 87%.  Moist mucous membranes, lungs were clear to auscultation bilaterally, heart S1-S2 present rhythmic, no gallops, rubs or murmurs, the abdomen was soft nontender, no lower extremity edema.  Sodium 138, potassium 3.3, chloride 103, bicarb 21, glucose 140, BUN 11, creatinine 0.64, white count 7.9, hemoglobin 12.3, hematocrit 37.9, platelets 197.  Influenza A+, chest x-ray with treated left lower lobe and lower zone of left upper lobe.  EKG normal sinus rhythm, normal axis, normal intervals.  Patient was admitted to the hospital with a working diagnosis of acute hypoxic respiratory failure due to influenza A complicated by left upper (lower zone) and left lower lobe pneumonia.   Assessment & Plan:   Principal Problem:   PNA (pneumonia) Active Problems:   Acute respiratory failure with hypoxia (HCC)   COPD mixed type ([place])   Tobacco abuse   S/P lobectomy of lung-carcinoid 2016   1.  Acute hypoxic respiratory failure due to multilobar pneumonia. Responding well to  antibiotic therapy with IV Azithromycin and Ceftriaxone, continue aggressive bronchodilator therapy (flutter valve), dyspnea and bronchoconstriction continue to improve, oxymetry is 98% on 2LPM.   2.  Acute influenza A. On Oseltamivir for 5 days. Improved symptoms, patient has been afebrile.   3.  COPD exacerbation. Continue aggressive bronchodilator therapy, supplemental 02 per [place], systemic steroids and  antibiotic therapy for pneumonia. Tolerating well flutter valve with nebs.   4.  Tobacco abuse. Continue smoking cessation   5. Anxiety/ depression with PTSD. Will increase clonazepam bid per home regimen, continue carbamazepine, duloxetine, and seroquel.   6. HTN. Tolerating well verapamil and metoprolol. Patient is off IV fluids.   7. Hypokalemia. Corrected hypokalemia, serum K up to 4,7. Renal function preserved with serum cr at 0.53.    DVT prophylaxis: enoxaparin  Code Status:  full Family Communication: no family at the bedside Disposition Plan: hom   Consultants:     Procedures:     Antimicrobials:      Subjective: Patient is feeling better, dyspnea continue to improve, along with coughing and wheezing. Not back to baseline. Positive anxiety. At home on bid clonazepam.   Objective: Vitals:   03/30/17 0332 03/30/17 0650 03/30/17 0737 03/30/17 0740  BP:  (!) 112/48    Pulse:  69    Resp:  20    Temp:  98.6 F (37 C)    TempSrc:  Oral    SpO2: 96% 95% 94% 94%  Weight:      Height:        Intake/Output Summary (Last 24 hours) at 03/30/2017 1354 Last data filed at 03/30/2017 1000 Gross per 24 hour  Intake 360 ml  Output -  Net 360 ml   Filed Weights   03/28/17 1125  Weight: 97.1 kg (214 lb)    Examination:   General: Not in pain or dyspnea. deconditioned Neurology: Awake and alert, non focal  E ENT: mild pallor, no icterus, oral mucosa moist Cardiovascular: No JVD. S1-S2 present, rhythmic, no gallops, rubs, or murmurs. No lower extremity  edema. Pulmonary: decreased breath sounds bilaterally, poor air movement, scattered wheezing, expiratory rhonchi and inspiratory rales at bases. Gastrointestinal, no organomegaly, non tender, no rebound or guarding Skin. No rashes Musculoskeletal: no joint deformities     Data Reviewed: I have personally reviewed following labs and imaging studies  CBC: Recent Labs  Lab 03/28/17 0835  03/29/17 0526 03/30/17 0546  WBC 7.9 9.1 6.3  NEUTROABS 7.0 7.0 5.3  HGB 12.3 11.2* 10.6*  HCT 37.9 35.3* 33.5*  MCV 86.1 86.1 87.7  PLT 197 206 468   Basic Metabolic Panel: Recent Labs  Lab 03/28/17 0835 03/29/17 0526 03/30/17 0546  NA 138 140 142  K 3.3* 3.0* 4.7  CL 103 105 106  CO2 21* 22 21*  GLUCOSE 140* 163* 164*  BUN 11 16 12   CREATININE 0.64 0.70 0.53  CALCIUM 8.5* 8.6* 8.8*   GFR: Estimated Creatinine Clearance: 80.9 mL/min (by C-G formula based on SCr of 0.53 mg/dL). Liver Function Tests: Recent Labs  Lab 03/28/17 0835  AST 35  ALT 17  ALKPHOS 63  BILITOT 0.9  PROT 6.9  ALBUMIN 3.5   No results for input(s): LIPASE, AMYLASE in the last 168 hours. No results for input(s): AMMONIA in the last 168 hours. Coagulation Profile: No results for input(s): INR, PROTIME in the last 168 hours. Cardiac Enzymes: Recent Labs  Lab 03/28/17 0835  TROPONINI <0.03   BNP (last 3 results) No results for input(s): PROBNP in the last 8760 hours. HbA1C: No results for input(s): HGBA1C in the last 72 hours. CBG: No results for input(s): GLUCAP in the last 168 hours. Lipid Profile: No results for input(s): CHOL, HDL, LDLCALC, TRIG, CHOLHDL, LDLDIRECT in the last 72 hours. Thyroid Function Tests: No results for input(s): TSH, T4TOTAL, FREET4, T3FREE, THYROIDAB in the last 72 hours. Anemia Panel: No results for input(s): VITAMINB12, FOLATE, FERRITIN, TIBC, IRON, RETICCTPCT in the last 72 hours.    Radiology Studies: I have reviewed all of the imaging during this hospital visit personally     Scheduled Meds: . Brexpiprazole  2 mg Oral Daily  . carbamazepine  100 mg Oral TID  . clonazePAM  1 mg Oral QHS  . DULoxetine  120 mg Oral QHS  . guaiFENesin-dextromethorphan  5 mL Oral QID  . hydroxychloroquine  200 mg Oral BID  . ipratropium-albuterol  3 mL Nebulization TID  . methylPREDNISolone (SOLU-MEDROL) injection  40 mg Intravenous Q8H  . metoprolol succinate   100 mg Oral Daily  . pregabalin  100 mg Oral Daily   And  . pregabalin  200 mg Oral QHS  . QUEtiapine  50 mg Oral QHS  . sodium chloride flush  3 mL Intravenous Q12H  . verapamil  240 mg Oral QHS   Continuous Infusions: . sodium chloride    . azithromycin Stopped (03/30/17 1130)  . cefTRIAXone (ROCEPHIN)  IV Stopped (03/30/17 1014)     LOS: 1 day        Kamori Barbier Gerome Apley, MD [facility] Pager [phone]

## 2017-03-31 MED ORDER — AZITHROMYCIN 250 MG PO TABS
500.0000 mg | ORAL_TABLET | Freq: Every day | ORAL | Status: DC
  Administered 2017-04-01 – 2017-04-02 (×2): 500 mg via ORAL
  Filled 2017-03-31 (×2): qty 2

## 2017-03-31 MED ORDER — IPRATROPIUM-ALBUTEROL 0.5-2.5 (3) MG/3ML IN SOLN
3.0000 mL | Freq: Four times a day (QID) | RESPIRATORY_TRACT | Status: DC
  Administered 2017-03-31 – 2017-04-02 (×8): 3 mL via RESPIRATORY_TRACT
  Filled 2017-03-31 (×7): qty 3

## 2017-03-31 NOTE — Progress Notes (Signed)
PHARMACIST - PHYSICIAN COMMUNICATION  CONCERNING: Antibiotic IV to Oral Route Change Policy  RECOMMENDATION: This patient is receiving azithromycin by the intravenous route.  Based on criteria approved by the Pharmacy and Therapeutics Committee, the antibiotic(s) is/are being converted to the equivalent oral dose form(s).   DESCRIPTION: These criteria include:  Patient being treated for a respiratory tract infection, urinary tract infection, cellulitis or clostridium difficile associated diarrhea if on metronidazole  The patient is not neutropenic and does not exhibit a GI malabsorption state  The patient is eating (either orally or via tube) and/or has been taking other orally administered medications for a least 24 hours  The patient is improving clinically and has a Tmax < 100.5  If you have questions about this conversion, please contact the Pharmacy Department  []   [phone] )  [facility] []   [phone] )  [facility] []   [phone] )  [facility] []   [phone] )  [facility] [x]   [phone] )  [place], [place].D. [phone] 03/31/2017 10:43 AM

## 2017-03-31 NOTE — Progress Notes (Signed)
PROGRESS NOTE    Breanna Dixon  EQA:[id number] DOB: 09-Dec-1945 DOA: 03/28/2017 PCP: Vicenta Aly, FNP    Brief Narrative:  72 year old female who presented with dyspnea and cough. She does have thesignificant past medical history for COPD, fibromyalgia and hypertension. She had upper respiratory tract infection symptoms for the last 7 days prior to hospitalization, associated with fever and cough.On the initial physical examination blood pressure 134/47, heart rate 91, respiratory rate20, oxygen saturation 87%.Moist mucous membranes, lungs were clear to auscultation bilaterally, heart S1-S2 present rhythmic, no gallops, rubs or murmurs, the abdomen was soft nontender, no lower extremity edema.Sodium 138, potassium 3.3, chloride 103, bicarb 21, glucose 140, BUN 11, creatinine 0.64, white count 7.9, hemoglobin 12.3, hematocrit 37.9, platelets 197.Influenza A+,chest x-ray with treated left lower lobe and lower zone of left upper lobe.EKG normal sinus rhythm, normal axis, normal intervals.  Patient was admitted to the hospital with a working diagnosis of acute hypoxic respiratory failure due to influenza A complicated by left upper(lower zone)and left lower lobe pneumonia.   Assessment & Plan:   Principal Problem:   PNA (pneumonia) Active Problems:   Acute respiratory failure with hypoxia (HCC)   COPD mixed type ([place])   Tobacco abuse   S/P lobectomy of lung-carcinoid 2016   1.Acute hypoxic respiratory failure due to multilobar pneumonia. Continue antibiotic therapy with IV Azithromycin and Ceftriaxone, aggressive bronchodilator therapy (flutter valve), This am with significant wheezing, will continue systemic steroids. Out of bed as tolerated.    2.Acute influenza A. continue with Oseltamivir for 5 days. No fever over last 24 hours.   3.COPD exacerbation. Aggressive bronchodilator therapy, plus supplemental 02 per [place], continue systemic steroids and antibiotic  therapy for pneumonia.  4.Tobacco abuse. Smoking cessation   5. Anxiety/ depression with PTSD. Continue clonazepam bid per home regimen, continue carbamazepine, duloxetine, and seroquel. Neuro checks per unit protocol.   6. HTN. Continue verapamil and metoprolol and verapamil. Systolic blood pressure 979'G systolic.   7. Hypokalemia resolved. Follow renal panel in am.   DVT prophylaxis:enoxaparin Code Status:full Family Communication:no family at the bedside Disposition Plan:hom   Consultants:    Procedures:    Antimicrobials:      Subjective: Patient with persistent dyspnea, this am with worse wheezing and cough, no nausea or vomiting, no chest pain or abdominal pain.   Objective: Vitals:   03/31/17 0700 03/31/17 0736 03/31/17 0738 03/31/17 1100  BP: (!) 114/51   (!) 126/46  Pulse: 66   69  Resp: 19     Temp: 98.4 F (36.9 C)     TempSrc: Oral     SpO2: 94% 97% 97%   Weight:      Height:        Intake/Output Summary (Last 24 hours) at 03/31/2017 1201 Last data filed at 03/31/2017 0900 Gross per 24 hour  Intake 1780 ml  Output -  Net 1780 ml   Filed Weights   03/28/17 1125  Weight: 97.1 kg (214 lb)    Examination:   General: Not in pain or dyspnea, deconditioned Neurology: Awake and alert, non focal  E ENT: mild pallor, no icterus, oral mucosa moist Cardiovascular: No JVD. S1-S2 present, rhythmic, no gallops, rubs, or murmurs. No lower extremity edema. Pulmonary: decreased breath sounds bilaterally, poor air movement, expiratory wheezing, with diffuse rhonchi and rales. Gastrointestinal. Abdomen with no organomegaly, non tender, no rebound or guarding Skin. No rashes Musculoskeletal: no joint deformities     Data Reviewed: I have personally reviewed following labs  and imaging studies  CBC: Recent Labs  Lab 03/28/17 0835 03/29/17 0526 03/30/17 0546  WBC 7.9 9.1 6.3  NEUTROABS 7.0 7.0 5.3  HGB 12.3 11.2* 10.6*  HCT  37.9 35.3* 33.5*  MCV 86.1 86.1 87.7  PLT 197 206 681   Basic Metabolic Panel: Recent Labs  Lab 03/28/17 0835 03/29/17 0526 03/30/17 0546  NA 138 140 142  K 3.3* 3.0* 4.7  CL 103 105 106  CO2 21* 22 21*  GLUCOSE 140* 163* 164*  BUN 11 16 12   CREATININE 0.64 0.70 0.53  CALCIUM 8.5* 8.6* 8.8*   GFR: Estimated Creatinine Clearance: 80.9 mL/min (by C-G formula based on SCr of 0.53 mg/dL). Liver Function Tests: Recent Labs  Lab 03/28/17 0835  AST 35  ALT 17  ALKPHOS 63  BILITOT 0.9  PROT 6.9  ALBUMIN 3.5   No results for input(s): LIPASE, AMYLASE in the last 168 hours. No results for input(s): AMMONIA in the last 168 hours. Coagulation Profile: No results for input(s): INR, PROTIME in the last 168 hours. Cardiac Enzymes: Recent Labs  Lab 03/28/17 0835  TROPONINI <0.03   BNP (last 3 results) No results for input(s): PROBNP in the last 8760 hours. HbA1C: No results for input(s): HGBA1C in the last 72 hours. CBG: No results for input(s): GLUCAP in the last 168 hours. Lipid Profile: No results for input(s): CHOL, HDL, LDLCALC, TRIG, CHOLHDL, LDLDIRECT in the last 72 hours. Thyroid Function Tests: No results for input(s): TSH, T4TOTAL, FREET4, T3FREE, THYROIDAB in the last 72 hours. Anemia Panel: No results for input(s): VITAMINB12, FOLATE, FERRITIN, TIBC, IRON, RETICCTPCT in the last 72 hours.    Radiology Studies: I have reviewed all of the imaging during this hospital visit personally     Scheduled Meds: . azithromycin  500 mg Oral Daily  . Brexpiprazole  2 mg Oral Daily  . carbamazepine  100 mg Oral TID  . clonazePAM  1 mg Oral BID  . DULoxetine  120 mg Oral QHS  . guaiFENesin-dextromethorphan  5 mL Oral QID  . hydroxychloroquine  200 mg Oral BID  . ipratropium-albuterol  3 mL Nebulization TID  . methylPREDNISolone (SOLU-MEDROL) injection  40 mg Intravenous Q8H  . metoprolol succinate  100 mg Oral Daily  . oseltamivir  75 mg Oral Daily  .  pregabalin  100 mg Oral Daily   And  . pregabalin  200 mg Oral QHS  . QUEtiapine  50 mg Oral QHS  . sodium chloride flush  3 mL Intravenous Q12H  . verapamil  240 mg Oral QHS   Continuous Infusions: . sodium chloride    . cefTRIAXone (ROCEPHIN)  IV Stopped (03/31/17 1159)     LOS: 2 days        Kodee Drury Gerome Apley, MD [facility] Pager [phone]

## 2017-04-01 DIAGNOSIS — J101 Influenza due to other identified influenza virus with other respiratory manifestations: Secondary | ICD-10-CM

## 2017-04-01 LAB — BASIC METABOLIC PANEL
ANION GAP: 11 (ref 5–15)
BUN: 14 mg/dL (ref 6–20)
CALCIUM: 9.2 mg/dL (ref 8.9–10.3)
CO2: 27 mmol/L (ref 22–32)
Chloride: 103 mmol/L (ref 101–111)
Creatinine, Ser: 0.56 mg/dL (ref 0.44–1.00)
Glucose, Bld: 146 mg/dL — ABNORMAL HIGH (ref 65–99)
POTASSIUM: 4.1 mmol/L (ref 3.5–5.1)
Sodium: 141 mmol/L (ref 135–145)

## 2017-04-01 LAB — CBC WITH DIFFERENTIAL/PLATELET
Basophils Absolute: 0 10*3/uL (ref 0.0–0.1)
Basophils Relative: 0 %
EOS ABS: 0 10*3/uL (ref 0.0–0.7)
EOS PCT: 0 %
HCT: 32.9 % — ABNORMAL LOW (ref 36.0–46.0)
HEMOGLOBIN: 10.7 g/dL — AB (ref 12.0–15.0)
LYMPHS ABS: 1 10*3/uL (ref 0.7–4.0)
Lymphocytes Relative: 11 %
MCH: 28.3 pg (ref 26.0–34.0)
MCHC: 32.5 g/dL (ref 30.0–36.0)
MCV: 87 fL (ref 78.0–100.0)
Monocytes Absolute: 0.5 10*3/uL (ref 0.1–1.0)
Monocytes Relative: 6 %
NEUTROS PCT: 83 %
Neutro Abs: 6.8 10*3/uL (ref 1.7–7.7)
PLATELETS: 298 10*3/uL (ref 150–400)
RBC: 3.78 MIL/uL — ABNORMAL LOW (ref 3.87–5.11)
RDW: 14.9 % (ref 11.5–15.5)
WBC: 8.3 10*3/uL (ref 4.0–10.5)

## 2017-04-01 LAB — PROCALCITONIN: Procalcitonin: <0.1 ng/mL

## 2017-04-01 MED ORDER — PREDNISONE 20 MG PO TABS
60.0000 mg | ORAL_TABLET | Freq: Every day | ORAL | Status: DC
  Administered 2017-04-02: 09:00:00 60 mg via ORAL
  Filled 2017-04-01: qty 3

## 2017-04-01 MED ORDER — ARFORMOTEROL TARTRATE 15 MCG/2ML IN NEBU
15.0000 ug | INHALATION_SOLUTION | Freq: Two times a day (BID) | RESPIRATORY_TRACT | Status: DC
  Administered 2017-04-02: 15 ug via RESPIRATORY_TRACT
  Filled 2017-04-01 (×3): qty 2

## 2017-04-01 MED ORDER — BUDESONIDE 0.5 MG/2ML IN SUSP
0.5000 mg | Freq: Two times a day (BID) | RESPIRATORY_TRACT | Status: DC
  Administered 2017-04-01 – 2017-04-02 (×2): 0.5 mg via RESPIRATORY_TRACT
  Filled 2017-04-01 (×2): qty 2

## 2017-04-01 NOTE — Progress Notes (Signed)
PROGRESS NOTE [facility]   Breanna Dixon   DJM:[id number] DOB: Oct 16, 1945  DOA: 03/28/2017 PCP: Vicenta Aly, FNP   Brief Narrative:  Breanna Dixon is a 72 year old female with past medical history of COPD, fibromyalgia and hypertension who presented to the emergency department complaining of dyspnea and cough.  Upon initial evaluation patient was found to be febrile, hypoxic with oxygen saturation to 87%.  Influenza A was positive and chest x-ray was positive for left  upper and lower lobe infiltrate.  Patient was admitted with working diagnosis of acute hypoxic respiratory failure due to influenza a complicated by multilobar pneumonia.  Patient was started on Tamiflu and empiric antibiotics  Subjective: Patient seen and examined, reported her breathing has significantly improved but still tight and wheezing.  Continues to cough with green/yellow sputum.  Denies chest pain.  Assessment & Plan: Acute hypoxic respiratory failure due to influenza A and multilobar pneumonia Patient be treated with empiric antibiotic IV azithromycin and ceftriaxone, will obtain pro-calcitonin if negative will discontinue antibiotic therapy. Patient initially treated with IV Solu-Medrol will transition to oral prednisone and will taper for 14 days Continue nebulizer treatment, will add Brovana and Pulmicort for now. Continue Tamiflu Wean O2, patient was doing well at room air, no need for oxygen supplementation at this time Patient remains afebrile  Influenza A Continue supportive treatment See above  COPD exacerbation due to influenza A See above  Anxiety/depression PTSD Continue current regimen Stable  Hypertension BP stable Continue verapamil and metoprolol  Tobacco abuse Smoking cessation discussed  DVT prophylaxis: Lovenox Code Status: Full code Family Communication: None at bedside Disposition Plan: Home in 1-2 days   Consultants:   None  Procedures:    None  Antimicrobials: Anti-infectives (From admission, onward)   Start     Dose/Rate Route Frequency Ordered Stop   03/31/17 1200  azithromycin (ZITHROMAX) tablet 500 mg     500 mg Oral Daily 03/31/17 1041 04/04/17 0959   03/30/17 1600  oseltamivir (TAMIFLU) capsule 75 mg     75 mg Oral Daily 03/30/17 1443 04/04/17 0959   03/29/17 1000  cefTRIAXone (ROCEPHIN) 1 g in sodium chloride 0.9 % 100 mL IVPB     1 g 200 mL/hr over 30 Minutes Intravenous Every 24 hours 03/28/17 1142 04/05/17 0959   03/29/17 1000  azithromycin (ZITHROMAX) 500 mg in sodium chloride 0.9 % 250 mL IVPB  Status:  Discontinued     500 mg 250 mL/hr over 60 Minutes Intravenous Every 24 hours 03/28/17 1142 03/31/17 1041   03/28/17 2200  hydroxychloroquine (PLAQUENIL) tablet 200 mg     200 mg Oral 2 times daily 03/28/17 1142     03/28/17 1100  vancomycin (VANCOCIN) 1,500 mg in sodium chloride 0.9 % 500 mL IVPB  Status:  Discontinued     1,500 mg 250 mL/hr over 120 Minutes Intravenous  Once 03/28/17 1020 03/28/17 1027   03/28/17 1030  ceFEPIme (MAXIPIME) 1 g in sodium chloride 0.9 % 100 mL IVPB  Status:  Discontinued     1 g 200 mL/hr over 30 Minutes Intravenous  Once 03/28/17 1016 03/28/17 1027   03/28/17 1030  azithromycin (ZITHROMAX) 500 mg in sodium chloride 0.9 % 250 mL IVPB     500 mg 250 mL/hr over 60 Minutes Intravenous  Once 03/28/17 1027 03/28/17 1226   03/28/17 1030  cefTRIAXone (ROCEPHIN) 1 g in sodium chloride 0.9 % 100 mL IVPB     1 g 200 mL/hr over 30 Minutes Intravenous  Once 03/28/17 1027 03/28/17 1156         Objective: Vitals:   04/01/17 1028 04/01/17 1045 04/01/17 1425 04/01/17 1525  BP: (!) 146/55   (!) 134/53  Pulse: 83   64  Resp:    18  Temp:    98.5 F (36.9 C)  TempSrc:    Oral  SpO2:  93% 94% 99%  Weight:      Height:        Intake/Output Summary (Last 24 hours) at 04/01/2017 1909 Last data filed at 04/01/2017 1900 Gross per 24 hour  Intake 1123 ml  Output -  Net 1123 ml    Filed Weights   03/28/17 1125  Weight: 97.1 kg (214 lb)    Examination:  General exam: Appears calm and comfortable  HEENT: OP moist and clear Respiratory system: Decreased air entry at the bases, diffuse wheezing and rhonchi Cardiovascular system: S1 & S2 heard, RRR. No JVD, murmurs, rubs or gallops Gastrointestinal system: Abdomen is nondistended, soft and nontender.  Central nervous system: Alert and oriented. No focal neurological deficits. Extremities: No lower extremity edema Skin: No rashes Psychiatry: Mood & affect appropriate.    Data Reviewed: I have personally reviewed following labs and imaging studies  CBC: Recent Labs  Lab 03/28/17 0835 03/29/17 0526 03/30/17 0546 04/01/17 0607  WBC 7.9 9.1 6.3 8.3  NEUTROABS 7.0 7.0 5.3 6.8  HGB 12.3 11.2* 10.6* 10.7*  HCT 37.9 35.3* 33.5* 32.9*  MCV 86.1 86.1 87.7 87.0  PLT 197 206 194 809   Basic Metabolic Panel: Recent Labs  Lab 03/28/17 0835 03/29/17 0526 03/30/17 0546 04/01/17 0607  NA 138 140 142 141  K 3.3* 3.0* 4.7 4.1  CL 103 105 106 103  CO2 21* 22 21* 27  GLUCOSE 140* 163* 164* 146*  BUN 11 16 12 14   CREATININE 0.64 0.70 0.53 0.56  CALCIUM 8.5* 8.6* 8.8* 9.2   GFR: Estimated Creatinine Clearance: 80.9 mL/min (by C-G formula based on SCr of 0.56 mg/dL). Liver Function Tests: Recent Labs  Lab 03/28/17 0835  AST 35  ALT 17  ALKPHOS 63  BILITOT 0.9  PROT 6.9  ALBUMIN 3.5   No results for input(s): LIPASE, AMYLASE in the last 168 hours. No results for input(s): AMMONIA in the last 168 hours. Coagulation Profile: No results for input(s): INR, PROTIME in the last 168 hours. Cardiac Enzymes: Recent Labs  Lab 03/28/17 0835  TROPONINI <0.03   BNP (last 3 results) No results for input(s): PROBNP in the last 8760 hours. HbA1C: No results for input(s): HGBA1C in the last 72 hours. CBG: No results for input(s): GLUCAP in the last 168 hours. Lipid Profile: No results for input(s): CHOL,  HDL, LDLCALC, TRIG, CHOLHDL, LDLDIRECT in the last 72 hours. Thyroid Function Tests: No results for input(s): TSH, T4TOTAL, FREET4, T3FREE, THYROIDAB in the last 72 hours. Anemia Panel: No results for input(s): VITAMINB12, FOLATE, FERRITIN, TIBC, IRON, RETICCTPCT in the last 72 hours. Sepsis Labs: No results for input(s): PROCALCITON, LATICACIDVEN in the last 168 hours.  Recent Results (from the past 240 hour(s))  Culture, blood (routine x 2) Call MD if unable to obtain prior to antibiotics being given     Status: None (Preliminary result)   Collection Time: 03/28/17  2:25 PM  Result Value Ref Range Status   Specimen Description   Final    BLOOD LEFT ANTECUBITAL Performed at [facility] [address] [place], [place] [address]    Special Requests  Final    BOTTLES DRAWN AEROBIC AND ANAEROBIC Blood Culture adequate volume Performed at [facility] [address], [place], [place] [address]    Culture   Final    NO GROWTH 4 DAYS Performed at [facility], [place] [address] [place], [place] [address]    Report Status PENDING  Incomplete  Culture, blood (routine x 2) Call MD if unable to obtain prior to antibiotics being given     Status: None (Preliminary result)   Collection Time: 03/28/17  2:30 PM  Result Value Ref Range Status   Specimen Description   Final    BLOOD LEFT HAND Performed at [facility] [address], [place], [place] [address]    Special Requests   Final    BOTTLES DRAWN AEROBIC AND ANAEROBIC Blood Culture adequate volume Performed at [facility] [address] [place], [place] [address]    Culture   Final    NO GROWTH 4 DAYS Performed at [facility], [place] [address] [place], [place] [address]    Report Status PENDING  Incomplete  MRSA PCR Screening     Status: None   Collection Time: 03/29/17  7:53 PM  Result Value Ref Range Status   MRSA by PCR NEGATIVE  NEGATIVE Final    Comment:        The GeneXpert MRSA Assay (FDA approved for NASAL specimens only), is one component of a comprehensive MRSA colonization surveillance program. It is not intended to diagnose MRSA infection nor to guide or monitor treatment for MRSA infections. Performed at [facility], [place] [address] [place], [place] [address]       Radiology Studies: No results found.    Scheduled Meds: . arformoterol  15 mcg Nebulization BID  . azithromycin  500 mg Oral Daily  . Brexpiprazole  2 mg Oral Daily  . budesonide (PULMICORT) nebulizer solution  0.5 mg Nebulization BID  . carbamazepine  100 mg Oral TID  . clonazePAM  1 mg Oral BID  . DULoxetine  120 mg Oral QHS  . guaiFENesin-dextromethorphan  5 mL Oral QID  . hydroxychloroquine  200 mg Oral BID  . ipratropium-albuterol  3 mL Nebulization Q6H  . methylPREDNISolone (SOLU-MEDROL) injection  40 mg Intravenous Q8H  . metoprolol succinate  100 mg Oral Daily  . oseltamivir  75 mg Oral Daily  . [START ON 04/02/2017] predniSONE  60 mg Oral Q breakfast  . pregabalin  100 mg Oral Daily   And  . pregabalin  200 mg Oral QHS  . QUEtiapine  50 mg Oral QHS  . sodium chloride flush  3 mL Intravenous Q12H  . verapamil  240 mg Oral QHS   Continuous Infusions: . sodium chloride    . cefTRIAXone (ROCEPHIN)  IV Stopped (04/01/17 1059)     LOS: 3 days    Time spent: Total of 25 minutes spent with pt, greater than 50% of which was spent in discussion of  treatment, counseling and coordination of care   Chipper Oman, MD Pager: Text Page via [email or web address]   If 7PM-7AM, please contact night-coverage [email or web address] 04/01/2017, 7:09 PM   Note - This record has been created using [employer]. Chart creation errors have been sought, but may not always have been located. Such creation errors do not reflect on the standard of medical care.

## 2017-04-01 NOTE — Evaluation (Signed)
Physical Therapy Evaluation Patient Details Name: Breanna Dixon MRN: [id number] DOB: 11/18/1945 Today's Date: 04/01/2017   History of Present Illness  Breanna Dixon is a 72 y.o. female adm 03/28/17 with flu like symptoms with persistent fevers and cough, + Flu A &  pna  PMHx: breast CA, lung CA,  COPD,  fibromyalgia, lupus, hypertension, head injury, anxiety, PTSD, R TKA  Clinical Impression  Pt admitted with above diagnosis. Pt currently with functional limitations due to the deficits listed below (see PT Problem List).  Pt will benefit from skilled PT to increase their independence and safety with mobility to allow discharge to the venue listed below.       Follow Up Recommendations Home health PT(unless progress slow, then SNF)    Equipment Recommendations  None recommended by PT    Recommendations for Other Services       Precautions / Restrictions Precautions Precautions: Fall Restrictions Weight Bearing Restrictions: No      Mobility  Bed Mobility Overal bed mobility: Needs Assistance Bed Mobility: Supine to Sit     Supine to sit: Supervision     General bed mobility comments: incr time  Transfers Overall transfer level: Needs assistance Equipment used: Rolling walker (2 wheeled) Transfers: Sit to/from Stand Sit to Stand: Min guard;Min assist         General transfer comment: light assist to safely rise adn balance  Ambulation/Gait Ambulation/Gait assistance: Min guard Ambulation Distance (Feet): 10 Feet Assistive device: None Gait Pattern/deviations: Step-through pattern;Decreased stride length     General Gait Details: cues for posture, breathing, pt is mildly unsteady but no overt LOB--pt wants to hold onto dinamap but declines use of RW-- fatigues rapidly; O2 sats 92--94% on RA, pt reports feeling "weird" and chair brought to pt for seated rest  Stairs            Wheelchair Mobility    Modified Rankin (Stroke Patients Only)        Balance Overall balance assessment: Needs assistance Sitting-balance support: No upper extremity supported;Feet supported Sitting balance-Leahy Scale: Good       Standing balance-Leahy Scale: Fair Standing balance comment: reliant on UE support for dynamic task/wt shifting                             Pertinent Vitals/Pain Pain Assessment: 0-10 Pain Score: 2  Pain Location: left wrist Pain Descriptors / Indicators: Sore Pain Intervention(s): Monitored during session    Home Living Family/patient expects to be discharged to:: Private residence Living Arrangements: Non-relatives/Friends;Children(lives with son, and friend, neither drive) Available Help at Discharge: Family;Friend(s)   Home Access: Stairs to enter Entrance Stairs-Rails: Right Entrance Stairs-Number of Steps: 3  Home Layout: Two level;Bed/bath upstairs Home Equipment: Clinical cytogeneticist - 2 wheels;Cane - single point;Crutches Additional Comments: lives with son and roommate    Prior Function Level of Independence: Independent         Comments: pt drives, pt reports independence, did not use any of the above equipment     Hand Dominance        Extremity/Trunk Assessment   Upper Extremity Assessment Upper Extremity Assessment: Generalized weakness    Lower Extremity Assessment Lower Extremity Assessment: Generalized weakness       Communication   Communication: No difficulties  Cognition Arousal/Alertness: Awake/alert Behavior During Therapy: WFL for tasks assessed/performed Overall Cognitive Status: Within Functional Limits for tasks assessed  General Comments      Exercises     Assessment/Plan    PT Assessment Patient needs continued PT services  PT Problem List Decreased strength;Decreased activity tolerance;Decreased mobility;Decreased balance;Decreased knowledge of use of DME       PT Treatment Interventions  DME instruction;Gait training;Functional mobility training;Therapeutic activities;Therapeutic exercise;Patient/family education;Balance training    PT Goals (Current goals can be found in the Care Plan section)  Acute Rehab PT Goals Patient Stated Goal: get better, feel better PT Goal Formulation: With patient Time For Goal Achievement: 04/08/17 Potential to Achieve Goals: Good    Frequency Min 3X/week   Barriers to discharge        Co-evaluation               AM-PAC PT "6 Clicks" Daily Activity  Outcome Measure Difficulty turning over in bed (including adjusting bedclothes, sheets and blankets)?: A Little Difficulty moving from lying on back to sitting on the side of the bed? : A Little Difficulty sitting down on and standing up from a chair with arms (e.g., wheelchair, bedside commode, etc,.)?: A Little Help needed moving to and from a bed to chair (including a wheelchair)?: A Little Help needed walking in hospital room?: A Lot Help needed climbing 3-5 steps with a railing? : A Lot 6 Click Score: 16    End of Session Equipment Utilized During Treatment: Gait belt Activity Tolerance: Patient limited by fatigue Patient left: in chair;with call bell/phone within reach;with chair alarm set Nurse Communication: Mobility status PT Visit Diagnosis: Unsteadiness on feet (R26.81)    Time: 5520-8022 PT Time Calculation (min) (ACUTE ONLY): 23 min   Charges:   PT Evaluation $PT Eval Low Complexity: 1 Low PT Treatments $Gait Training: 8-22 mins   PT G CodesKenyon Ana, PT Pager: [phone] 04/01/2017   [facility] 04/01/2017, 12:37 PM

## 2017-04-02 ENCOUNTER — Telehealth (HOSPITAL_COMMUNITY): Payer: Self-pay

## 2017-04-02 LAB — CULTURE, BLOOD (ROUTINE X 2)
CULTURE: NO GROWTH
Culture: NO GROWTH
Special Requests: ADEQUATE
Special Requests: ADEQUATE

## 2017-04-02 MED ORDER — GUAIFENESIN-DM 100-10 MG/5ML PO SYRP: 5.0000 mL | ORAL_SOLUTION | Freq: Four times a day (QID) | ORAL | 0 refills | Status: DC

## 2017-04-02 MED ORDER — ALBUTEROL SULFATE (2.5 MG/3ML) 0.083% IN NEBU: 2.5000 mg | INHALATION_SOLUTION | Freq: Four times a day (QID) | RESPIRATORY_TRACT | 1 refills | Status: DC | PRN

## 2017-04-02 MED ORDER — COMPRESSOR/NEBULIZER MISC: 0 refills | Status: AC

## 2017-04-02 MED ORDER — OSELTAMIVIR PHOSPHATE 75 MG PO CAPS: 75.0000 mg | ORAL_CAPSULE | Freq: Every day | ORAL | 0 refills | Status: AC

## 2017-04-02 MED ORDER — PREDNISONE 10 MG PO TABS: ORAL_TABLET | ORAL | 0 refills | Status: DC

## 2017-04-02 NOTE — Progress Notes (Signed)
Patient needs nebulizer machine for medication prescribed by provider. Nurse paged provider to request prescription for nebulizer machine.

## 2017-04-02 NOTE — Progress Notes (Signed)
Discharge planning, spoke with patient at bedside. Have chosen [facility] PT, evaluate and treat and resp care. Contact[facility] for referral. Has DME. [phone]

## 2017-04-02 NOTE — Care Management Important Message (Signed)
Important Message  Patient Details IM Letter given to Susanne/Care Manager to present to the Patient Name: Breanna Dixon MRN: [id number] Date of Birth: 05/28/1945   Medicare Important Message Given:  Yes    Kerin Salen 04/02/2017, 11:02 AMImportant Message  Patient Details  Name: Breanna Dixon MRN: [id number] Date of Birth: [birth year]   Medicare Important Message Given:  Yes    Kerin Salen 04/02/2017, 11:02 AM

## 2017-04-02 NOTE — Discharge Summary (Signed)
Physician Discharge Summary  Breanna Dixon  ERD:[id number]  DOB: 1945/03/22  DOA: 03/28/2017 PCP: Vicenta Aly, FNP  Admit date: 03/28/2017 Discharge date: 04/02/2017  Admitted From: Home  Disposition: Home    Recommendations for Outpatient Follow-up:  1. Follow up with PCP in 1-2 weeks 2. Follow up with Pulmonology in 1-2 week  3. Please obtain BMP/CBC in one week to monitor Hgb and Cr  4. Please follow up on the following pending results: Final blood culture results  Home Health: PT   Discharge Condition: Stable  CODE STATUS: Full Code   Diet recommendation: Heart Healthy    Brief/Interim Summary: For full details see H&P/Progress note, but in brief, Breanna Dixon is a 72 year old female with past medical history of COPD, fibromyalgia and hypertension who presented to the emergency department complaining of dyspnea and cough.  Upon initial evaluation patient was found to be febrile, hypoxic with oxygen saturation to 87%.  Influenza A was positive and chest x-ray was positive for left  upper and lower lobe infiltrate.  Patient was admitted with working diagnosis of acute hypoxic respiratory failure due to influenza, complicated by multilobar pneumonia.  Patient was started on Tamiflu and empiric antibiotics.  Patient was also treated with IV steroids and nebulizer.  She was successfully weaned off oxygen, pro-calcitonin was negative, antibiotics were discontinued.  Patient was transitioned to oral prednisone.  Patient was evaluated by physical therapist who recommended home health PT.  Patient clinically improved and deemed stable for discharge to follow-up with PCP and pulmonary doctor.  Subjective: Patient  since and examined, feels significantly better.  Cough has improved.  Denies wheezing and shortness of breath.  Patient remains saturating well at room air.  No acute events overnight  Discharge Diagnoses/Hospital Course:  Acute hypoxic respiratory failure due to influenza A  and multilobar pneumonia Pneumonia felt to be viral - negative pro-calcitonin.  Successfully weaned off oxygen Patient was treated with empiric antibiotic IV azithromycin and ceftriaxone x 3 day. Can stop antibiotics safely as pro-calcitonin was negative Patient initially treated with IV Solu-Medrol will transition to oral prednisone and will taper for 14 days Continue nebulizer treatment, DuoNeb, Brovana and Pulmicort.  We will continue albuterol as needed at home and will prescribe Symbicort twice daily for now.  Will also add Flonase as patient continues with nasal congestion. Continue Tamiflu for 2 more days Oxygen saturation was checked with ambulation and patient remained above 92% with activity  Influenza A Continue supportive treatment See above  COPD exacerbation due to influenza A See above  Anxiety/depression PTSD Stable no SI/HI Continue home medications without any changes  Hypertension BP remained stable during hospital stay Continue verapamil and metoprolol  Tobacco abuse Smoking cessation discussed  All other chronic medical condition were stable during the hospitalization.  Patient was seen by physical therapy, recommending home health PT On the day of the discharge the patient's vitals were stable, and no other acute medical condition were reported by patient. the patient was felt safe to be discharge to home.   Discharge Instructions  You were cared for by a hospitalist during your hospital stay. If you have any questions about your discharge medications or the care you received while you were in the hospital after you are discharged, you can call the unit and asked to speak with the hospitalist on call if the hospitalist that took care of you is not available. Once you are discharged, your primary care physician will handle any further medical issues.  Please note that NO REFILLS for any discharge medications will be authorized once you are discharged, as it is  imperative that you return to your primary care physician (or establish a relationship with a primary care physician if you do not have one) for your aftercare needs so that they can reassess your need for medications and monitor your lab values.  Discharge Instructions    Diet - low sodium heart healthy   Complete by:  As directed    Discharge instructions   Complete by:  As directed    Discuss with pulmonologist Prednisone dose before stopping the medication Follow up with PCP in 1 week  Use albuterol inhaler OR nebulizer as needed for SOB, do not use both at the same time Complete Tamiflu for 2 more days   Increase activity slowly   Complete by:  As directed      Allergies as of 04/02/2017      Reactions   Imitrex [sumatriptan Base] Nausea And Vomiting   Rapid heart beat.   Ketorolac Tromethamine Nausea And Vomiting   Rapid heart beat.   Stadol [butorphanol Tartrate] Nausea And Vomiting   Ivp Dye [iodinated Diagnostic Agents] Nausea Only, Palpitations   Can tolerate iodine   Shellfish Allergy Nausea Only, Swelling, Palpitations      Medication List    STOP taking these medications   azithromycin 250 MG tablet Commonly known as:  ZITHROMAX   benzonatate 100 MG capsule Commonly known as:  TESSALON     TAKE these medications   albuterol (2.5 MG/3ML) 0.083% nebulizer solution Commonly known as:  PROVENTIL Take 3 mLs (2.5 mg total) by nebulization every 6 (six) hours as needed for wheezing or shortness of breath.   carbamazepine 100 MG 12 hr tablet Commonly known as:  TEGRETOL XR Take 100 mg by mouth 3 (three) times daily.   clonazePAM 1 MG tablet Commonly known as:  KLONOPIN Take 1 tablet by mouth at bedtime.   Compressor/Nebulizer Misc Dx COPD, use as needed   DULoxetine 60 MG capsule Commonly known as:  CYMBALTA Take 120 mg by mouth at bedtime.   fenofibrate 145 MG tablet Commonly known as:  TRICOR Take 1 tablet by mouth daily.   furosemide 20 MG  tablet Commonly known as:  LASIX Take 1 tablet (20 mg total) by mouth daily.   Glycopyrrolate-Formoterol 9-4.8 MCG/ACT Aero Commonly known as:  BEVESPI AEROSPHERE Inhale 2 puffs into the lungs 2 (two) times daily.   guaiFENesin-dextromethorphan 100-10 MG/5ML syrup Commonly known as:  ROBITUSSIN DM Take 5 mLs by mouth 4 (four) times daily.   hydroxychloroquine 200 MG tablet Commonly known as:  PLAQUENIL Take 200 mg by mouth 2 (two) times daily.   Ipratropium-Albuterol 20-100 MCG/ACT Aers respimat Commonly known as:  COMBIVENT RESPIMAT INHALE TWO PUFFS INTO LUNGS  EVERY 6 HOURS AS NEEDED FOR SHORTNESS OF BREATH   metoprolol succinate 100 MG 24 hr tablet Commonly known as:  TOPROL-XL Take 1 tablet (100 mg total) by mouth daily. Take with or immediately following a meal.   oseltamivir 75 MG capsule Commonly known as:  TAMIFLU Take 1 capsule (75 mg total) by mouth daily for 2 days. Start taking on:  04/03/2017   predniSONE 10 MG tablet Commonly known as:  DELTASONE Take 6 tablets for 3 days; Take 5 tablets for 4 days; Take 2 tablets daily What changed:    medication strength  additional instructions   pregabalin 100 MG capsule Commonly known as:  LYRICA Take 100-200  mg by mouth 2 (two) times daily. 1 tab in the am and 2 tabs at night.   QUEtiapine 50 MG tablet Commonly known as:  SEROQUEL Take 50 mg by mouth at bedtime.   ranitidine 150 MG capsule Commonly known as:  ZANTAC Take 150 mg by mouth 2 (two) times daily.   REXULTI 2 MG Tabs Generic drug:  Brexpiprazole Take 2 mg by mouth daily.   verapamil 240 MG (CO) 24 hr tablet Commonly known as:  COVERA HS Take 240 mg by mouth at bedtime.      Follow-up Information    Vicenta Aly, [place]. Schedule an appointment as soon as possible for a visit in 1 week(s).   Specialty:  Nurse Practitioner Why:  Hospital follow up  Contact information: [place] [address] [phone]         Deneise Lever, MD. Schedule an appointment as soon as possible for a visit in 2 week(s).   Specialty:  Pulmonary Disease Why:  Hospital follow up  Contact information: [address] [place] [address] [phone]          Allergies  Allergen Reactions  . Imitrex [Sumatriptan Base] Nausea And Vomiting    Rapid heart beat.  [name] Ketorolac Tromethamine Nausea And Vomiting    Rapid heart beat.  . Stadol [Butorphanol Tartrate] Nausea And Vomiting  . Ivp Dye [Iodinated Diagnostic Agents] Nausea Only and Palpitations    Can tolerate iodine  . Shellfish Allergy Nausea Only, Swelling and Palpitations    Consultations:  None    Procedures/Studies: Dg Chest 2 View  Result Date: 03/28/2017 CLINICAL DATA:  Severe shortness of breath.  Wheezing. EXAM: CHEST  2 VIEW COMPARISON:  12/24/2016. FINDINGS: Mediastinum hilar structures are normal. Left mid lung and left lung base infiltrate. No pleural effusion or pneumothorax. Thoracic spine scoliosis and degenerative change. Surgical clips right upper quadrant. IMPRESSION: Left mid lung and left lung base infiltrate consistent with pneumonia. Electronically Signed   By: Marcello Moores  Register   On: 03/28/2017 10:03    Discharge Exam: Vitals:   04/02/17 0545 04/02/17 1046  BP: (!) 136/45 (!) 137/53  Pulse:  79  Resp: 18   Temp: 98.2 F (36.8 C)   SpO2: 98% 94%   Vitals:   04/01/17 2040 04/01/17 2225 04/02/17 0545 04/02/17 1046  BP: (!) 143/59  (!) 136/45 (!) 137/53  Pulse: 64 68  79  Resp: 18  18   Temp: 97.7 F (36.5 C)  98.2 F (36.8 C)   TempSrc: Oral  Oral   SpO2: 100% 100% 98% 94%  Weight:      Height:        General: Pt is alert, awake, not in acute distress Cardiovascular: RRR, S1/S2 +, no rubs, no gallops Respiratory: CTA bilaterally, no wheezing, no rhonchi Abdominal: Soft, NT, ND, bowel sounds + Extremities: no edema, no cyanosis   The results of significant diagnostics from this hospitalization (including  imaging, microbiology, ancillary and laboratory) are listed below for reference.     Microbiology: Recent Results (from the past 240 hour(s))  Culture, blood (routine x 2) Call MD if unable to obtain prior to antibiotics being given     Status: None (Preliminary result)   Collection Time: 03/28/17  2:25 PM  Result Value Ref Range Status   Specimen Description   Final    BLOOD LEFT ANTECUBITAL Performed at [facility] [address], [place],  [address]  Special Requests   Final    BOTTLES DRAWN AEROBIC AND ANAEROBIC Blood Culture adequate volume Performed at [facility] [address], [place], [place] [address]    Culture   Final    NO GROWTH 4 DAYS Performed at [facility], [place] [address] [place], [place] [address]    Report Status PENDING  Incomplete  Culture, blood (routine x 2) Call MD if unable to obtain prior to antibiotics being given     Status: None (Preliminary result)   Collection Time: 03/28/17  2:30 PM  Result Value Ref Range Status   Specimen Description   Final    BLOOD LEFT HAND Performed at [facility] [address], [place], [place] [address]    Special Requests   Final    BOTTLES DRAWN AEROBIC AND ANAEROBIC Blood Culture adequate volume Performed at [facility] [address], [place], [place] [address]    Culture   Final    NO GROWTH 4 DAYS Performed at [facility], [place] [address] [place], [place] [address]    Report Status PENDING  Incomplete  MRSA PCR Screening     Status: None   Collection Time: 03/29/17  7:53 PM  Result Value Ref Range Status   MRSA by PCR NEGATIVE NEGATIVE Final    Comment:        The GeneXpert MRSA Assay (FDA approved for NASAL specimens only), is one component of a comprehensive MRSA colonization surveillance program. It is not intended to diagnose MRSA infection nor to guide or monitor treatment for MRSA  infections. Performed at [facility], [place] [address] [place], [place] [address]      Labs: BNP (last 3 results) Recent Labs    03/28/17 0836  BNP 34.1   Basic Metabolic Panel: Recent Labs  Lab 03/28/17 0835 03/29/17 0526 03/30/17 0546 04/01/17 0607  NA 138 140 142 141  K 3.3* 3.0* 4.7 4.1  CL 103 105 106 103  CO2 21* 22 21* 27  GLUCOSE 140* 163* 164* 146*  BUN 11 16 12 14   CREATININE 0.64 0.70 0.53 0.56  CALCIUM 8.5* 8.6* 8.8* 9.2   Liver Function Tests: Recent Labs  Lab 03/28/17 0835  AST 35  ALT 17  ALKPHOS 63  BILITOT 0.9  PROT 6.9  ALBUMIN 3.5   No results for input(s): LIPASE, AMYLASE in the last 168 hours. No results for input(s): AMMONIA in the last 168 hours. CBC: Recent Labs  Lab 03/28/17 0835 03/29/17 0526 03/30/17 0546 04/01/17 0607  WBC 7.9 9.1 6.3 8.3  NEUTROABS 7.0 7.0 5.3 6.8  HGB 12.3 11.2* 10.6* 10.7*  HCT 37.9 35.3* 33.5* 32.9*  MCV 86.1 86.1 87.7 87.0  PLT 197 206 194 298   Cardiac Enzymes: Recent Labs  Lab 03/28/17 0835  TROPONINI <0.03   BNP: Invalid input(s): POCBNP CBG: No results for input(s): GLUCAP in the last 168 hours. D-Dimer No results for input(s): DDIMER in the last 72 hours. Hgb A1c No results for input(s): HGBA1C in the last 72 hours. Lipid Profile No results for input(s): CHOL, HDL, LDLCALC, TRIG, CHOLHDL, LDLDIRECT in the last 72 hours. Thyroid function studies No results for input(s): TSH, T4TOTAL, T3FREE, THYROIDAB in the last 72 hours.  Invalid input(s): FREET3 Anemia work up No results for input(s): VITAMINB12, FOLATE, FERRITIN, TIBC, IRON, RETICCTPCT in the last 72 hours. Urinalysis    Component Value Date/Time   COLORURINE YELLOW 09/20/2016 1552  APPEARANCEUR CLEAR 09/20/2016 1552   LABSPEC 1.015 09/20/2016 1552   PHURINE 5.0 09/20/2016 1552   GLUCOSEU NEGATIVE 09/20/2016 1552   HGBUR NEGATIVE 09/20/2016 1552   BILIRUBINUR NEGATIVE 09/20/2016 1552   KETONESUR NEGATIVE  09/20/2016 1552   PROTEINUR NEGATIVE 09/20/2016 1552   UROBILINOGEN 0.2 10/23/2014 0355   NITRITE NEGATIVE 09/20/2016 1552   LEUKOCYTESUR NEGATIVE 09/20/2016 1552   Sepsis Labs Invalid input(s): PROCALCITONIN,  WBC,  LACTICIDVEN Microbiology Recent Results (from the past 240 hour(s))  Culture, blood (routine x 2) Call MD if unable to obtain prior to antibiotics being given     Status: None (Preliminary result)   Collection Time: 03/28/17  2:25 PM  Result Value Ref Range Status   Specimen Description   Final    BLOOD LEFT ANTECUBITAL Performed at [facility], [place] [address], [place], [place] [address]    Special Requests   Final    BOTTLES DRAWN AEROBIC AND ANAEROBIC Blood Culture adequate volume Performed at [facility] [address], [place], [place] [address]    Culture   Final    NO GROWTH 4 DAYS Performed at [facility], [place] [address] [place], [place] [address]    Report Status PENDING  Incomplete  Culture, blood (routine x 2) Call MD if unable to obtain prior to antibiotics being given     Status: None (Preliminary result)   Collection Time: 03/28/17  2:30 PM  Result Value Ref Range Status   Specimen Description   Final    BLOOD LEFT HAND Performed at [facility] [address], [place], [place] [address]    Special Requests   Final    BOTTLES DRAWN AEROBIC AND ANAEROBIC Blood Culture adequate volume Performed at [facility] [address], [place], [place] [address]    Culture   Final    NO GROWTH 4 DAYS Performed at [facility], [place] [address] [place], [place] [address]    Report Status PENDING  Incomplete  MRSA PCR Screening     Status: None   Collection Time: 03/29/17  7:53 PM  Result Value Ref Range Status   MRSA by PCR NEGATIVE NEGATIVE Final    Comment:        The GeneXpert MRSA Assay (FDA approved for NASAL specimens only), is one component of  a comprehensive MRSA colonization surveillance program. It is not intended to diagnose MRSA infection nor to guide or monitor treatment for MRSA infections. Performed at [facility], [place] [address], [place], [place] [address]     Time coordinating discharge: 32 minutes  SIGNED:  Chipper Oman, MD  [facility] 04/02/2017, 11:55 AM  Pager please text page via  [email or web address]  Note - This record has been created using [employer]. Chart creation errors have been sought, but may not always have been located. Such creation errors do not reflect on the standard of medical care.

## 2017-04-02 NOTE — Telephone Encounter (Signed)
2nd attempt to call patient in regards to Pulmonary Rehab - lm on vm. Sending letter. °

## 2017-04-04 ENCOUNTER — Encounter: Payer: Self-pay | Admitting: Internal Medicine

## 2017-04-04 ENCOUNTER — Ambulatory Visit (INDEPENDENT_AMBULATORY_CARE_PROVIDER_SITE_OTHER): Payer: Medicare Other | Admitting: Internal Medicine

## 2017-04-04 ENCOUNTER — Telehealth (HOSPITAL_COMMUNITY): Payer: Self-pay

## 2017-04-04 VITALS — BP 120/78 | HR 82 | Temp 98.1°F | Ht 67.0 in | Wt 207.8 lb

## 2017-04-04 DIAGNOSIS — Z7689 Persons encountering health services in other specified circumstances: Secondary | ICD-10-CM | POA: Diagnosis not present

## 2017-04-04 DIAGNOSIS — Z720 Tobacco use: Secondary | ICD-10-CM

## 2017-04-04 DIAGNOSIS — J1000 Influenza due to other identified influenza virus with unspecified type of pneumonia: Secondary | ICD-10-CM | POA: Diagnosis not present

## 2017-04-04 MED ORDER — OSELTAMIVIR PHOSPHATE 75 MG PO CAPS: ORAL_CAPSULE | ORAL | 0 refills | Status: DC

## 2017-04-04 MED ORDER — DOXYCYCLINE HYCLATE 100 MG PO TABS: 100.0000 mg | ORAL_TABLET | Freq: Two times a day (BID) | ORAL | 0 refills | Status: DC

## 2017-04-04 NOTE — Telephone Encounter (Signed)
3rd attempt to call patient in regards to Pulmonary Rehab - lm on vm.

## 2017-04-04 NOTE — Patient Instructions (Addendum)
Recommend you finish the prednisone 20 mg tabs, 1 daily.   Set the 10mg  prednisone tabs aside for another time.  Script printed for doxycycline antibiotic to start if you feel you are getting worse, with more fever, chills, green etc.  Script sent for Tamiflu to finish course   Order- PCP please refer to establish with primary care, or perhaps [facility]  Keep appointment in March
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
## 2017-04-05 ENCOUNTER — Encounter: Payer: Self-pay | Admitting: Internal Medicine

## 2017-04-05 NOTE — Assessment & Plan Note (Signed)
She intends to try again to quit this spring

## 2017-04-05 NOTE — Assessment & Plan Note (Signed)
Not sure all of what we are seeing on chest x-ray is influenza pneumonia.  She is at risk for bacterial superinfection. Plan-after discussion we chose to have her take the prednisone prescription she was given for 20 mg daily times 5 days, rather than the taper from 60 mg.  She will finish Tamiflu.  Given doxycycline to start if she seems to be relapsing at all.

## 2017-04-08 ENCOUNTER — Telehealth (HOSPITAL_COMMUNITY): Payer: Self-pay

## 2017-04-08 NOTE — Telephone Encounter (Signed)
Patient returned phone call and stated she was in the hospital for 9 days with Pneumonia. Patient is feeling better. Patient is interested in the Pulmonary rehab program. Scheduled orientation on 05/03/2017 at 1:30pm. Patient will attend the 10:30am exc class. Mailed packet.

## 2017-04-17 ENCOUNTER — Ambulatory Visit (INDEPENDENT_AMBULATORY_CARE_PROVIDER_SITE_OTHER): Payer: Medicare Other | Admitting: Family Medicine

## 2017-04-17 ENCOUNTER — Other Ambulatory Visit: Payer: Self-pay

## 2017-04-17 ENCOUNTER — Encounter: Payer: Self-pay | Admitting: Family Medicine

## 2017-04-17 VITALS — BP 126/70 | HR 78 | Temp 98.4°F | Ht 67.0 in | Wt 215.2 lb

## 2017-04-17 DIAGNOSIS — Z720 Tobacco use: Secondary | ICD-10-CM

## 2017-04-17 DIAGNOSIS — J1000 Influenza due to other identified influenza virus with unspecified type of pneumonia: Secondary | ICD-10-CM

## 2017-04-17 DIAGNOSIS — Z0000 Encounter for general adult medical examination without abnormal findings: Secondary | ICD-10-CM

## 2017-04-18 NOTE — Progress Notes (Signed)
  HPI:  Patient presents today for a new patient appointment to establish general primary care, also to discuss her recent pneumonia.  Patient recently admitted to the hospital in 03/28/2017 for pneumonia. Was admitted for 8 days on broad spectrum abx. Patient states that she wake up on 3/13 with a similar kind of chest pain and is worried that her pneumonia may be coming back. Has had a dry cough, no fevers at home. Did complete her course of antibiotics.  Patient is very interested in smoking cessation. She currently smokes about 1/2 ppd. She has tried numerous times to quit. She believes this is the most significant factor in regards to her health.  ROS: See HPI  Past Medical Hx:  -htn -copd - granulomatous lung disease - h/o pneumonia requiring hospitalization - OA of left hip - tobacco abuse - S/P Lobectomy of lung for carcinoid  Past Surgical Hx:  - s/p lobectomy of RLL in 2016 - cardiac cath - bilateral partial mastectomy - appendectomy -hysterectomy - ivc filter - joint replacement - knee arthroscopy  Family Hx: updated in Epic  Social Hx: lives in an apartment that she rents with 3 other women  Health Maintenance:  -needs hep c screening - otherwise up to date  PHYSICAL EXAM: BP 126/70   Pulse 78   Temp 98.4 F (36.9 C) (Oral)   Ht 5\' 7"  (1.702 m)   Wt 215 lb 3.2 oz (97.6 kg)   LMP 06/04/2011   SpO2 96%   BMI 33.71 kg/m  Gen: alert, oriented, no acute distress. Well appearing caucasian female HEENT: bilateral TM normal, no erythema in ear canal. NO signs of external trauma. Poor dentition Heart: rrr, no m/r/g. Palpable chest pain in left lower chest. Lungs: lungs clear to ausculation bilaterally. Area of no breath sounds in RLL 2/2 lobectomy Abdomen: soft, non-tender, non-distended Neuro: cn 2-12 intact, no focal neuro deficits, 5/5 strength BLE, BUE. No gait abnormalities.  ASSESSMENT/PLAN:  # Health maintenance:  -needs hep c screening  Patient  with complex medical history. Recently in the hospital with pneumonia. At this visit she is doing very well. PNA (pneumonia) Despite patient fearing that the pneumonia has returned, she is doing well clinically. Her lungs are completely clear to ausculation bilaterally. She is afebrile, satting well and without any other s/s of a possible infection.  Tobacco abuse Patient is very interested in smoking cessation. It seems that she has some triggers that are making it difficult to quit. Have asked patient to come back and see me in 1-3 weeks for a full visit on this topic.     FOLLOW UP: Follow up in 1-2 weeks for follow up for tobacco cessation  Guadalupe Dawn MD PGY-1 Family Medicine Resident

## 2017-04-19 ENCOUNTER — Telehealth (HOSPITAL_COMMUNITY): Payer: Self-pay

## 2017-04-19 DIAGNOSIS — Z0000 Encounter for general adult medical examination without abnormal findings: Secondary | ICD-10-CM | POA: Insufficient documentation

## 2017-04-19 NOTE — Assessment & Plan Note (Signed)
Patient is very interested in smoking cessation. It seems that she has some triggers that are making it difficult to quit. Have asked patient to come back and see me in 1-3 weeks for a full visit on this topic.

## 2017-04-19 NOTE — Assessment & Plan Note (Signed)
Despite patient fearing that the pneumonia has returned, she is doing well clinically. Her lungs are completely clear to ausculation bilaterally. She is afebrile, satting well and without any other s/s of a possible infection.

## 2017-04-19 NOTE — Telephone Encounter (Signed)
Patient called and spoke with Parke Simmers in regards to Pulmonary Rehab - patient stated she needs to reschedule orientation as she is going to be receiving HHPT for 4 weeks. Will call patient to reschedule.

## 2017-05-02 ENCOUNTER — Ambulatory Visit (INDEPENDENT_AMBULATORY_CARE_PROVIDER_SITE_OTHER): Payer: Medicare Other | Admitting: Internal Medicine

## 2017-05-02 ENCOUNTER — Encounter: Payer: Self-pay | Admitting: Internal Medicine

## 2017-05-02 ENCOUNTER — Other Ambulatory Visit (INDEPENDENT_AMBULATORY_CARE_PROVIDER_SITE_OTHER): Payer: Medicare Other

## 2017-05-02 ENCOUNTER — Ambulatory Visit (INDEPENDENT_AMBULATORY_CARE_PROVIDER_SITE_OTHER)
Admission: RE | Admit: 2017-05-02 | Discharge: 2017-05-02 | Disposition: A | Discharge status: Home / Self Care | Payer: Medicare Other | Source: Ambulatory Visit | Attending: Internal Medicine | Admitting: Internal Medicine

## 2017-05-02 VITALS — BP 118/70 | HR 73 | Ht 67.0 in | Wt 214.4 lb

## 2017-05-02 DIAGNOSIS — R0602 Shortness of breath: Secondary | ICD-10-CM

## 2017-05-02 DIAGNOSIS — J449 Chronic obstructive pulmonary disease, unspecified: Secondary | ICD-10-CM | POA: Diagnosis not present

## 2017-05-02 DIAGNOSIS — J441 Chronic obstructive pulmonary disease with (acute) exacerbation: Secondary | ICD-10-CM | POA: Diagnosis not present

## 2017-05-02 LAB — CBC WITH DIFFERENTIAL/PLATELET
BASOS ABS: 0.1 10*3/uL (ref 0.0–0.1)
Basophils Relative: 1.4 % (ref 0.0–3.0)
EOS ABS: 0.1 10*3/uL (ref 0.0–0.7)
Eosinophils Relative: 1.2 % (ref 0.0–5.0)
HCT: 40.4 % (ref 36.0–46.0)
Hemoglobin: 13.3 g/dL (ref 12.0–15.0)
LYMPHS ABS: 2.9 10*3/uL (ref 0.7–4.0)
Lymphocytes Relative: 31.7 % (ref 12.0–46.0)
MCHC: 33 g/dL (ref 30.0–36.0)
MCV: 84.4 fl (ref 78.0–100.0)
MONO ABS: 0.8 10*3/uL (ref 0.1–1.0)
Monocytes Relative: 8.3 % (ref 3.0–12.0)
NEUTROS ABS: 5.3 10*3/uL (ref 1.4–7.7)
NEUTROS PCT: 57.4 % (ref 43.0–77.0)
PLATELETS: 336 10*3/uL (ref 150.0–400.0)
RBC: 4.79 Mil/uL (ref 3.87–5.11)
RDW: 16.2 % — ABNORMAL HIGH (ref 11.5–15.5)
WBC: 9.2 10*3/uL (ref 4.0–10.5)

## 2017-05-02 LAB — BASIC METABOLIC PANEL
BUN: 15 mg/dL (ref 6–23)
CALCIUM: 9 mg/dL (ref 8.4–10.5)
CO2: 30 meq/L (ref 19–32)
CREATININE: 0.9 mg/dL (ref 0.40–1.20)
Chloride: 105 mEq/L (ref 96–112)
GFR: 65.85 mL/min (ref >60.00)
GLUCOSE: 120 mg/dL — AB (ref 70–99)
Potassium: 3.9 mEq/L (ref 3.5–5.1)
SODIUM: 141 meq/L (ref 135–145)

## 2017-05-02 LAB — BRAIN NATRIURETIC PEPTIDE: Pro B Natriuretic peptide (BNP): 68 pg/mL (ref 0.0–100.0)

## 2017-05-02 MED ORDER — FLUTICASONE-UMECLIDIN-VILANT 100-62.5-25 MCG/INH IN AEPB: 1.0000 | INHALATION_SPRAY | Freq: Every day | RESPIRATORY_TRACT | 4 refills | Status: DC

## 2017-05-02 MED ORDER — METHYLPREDNISOLONE ACETATE 80 MG/ML IJ SUSP
80.0000 mg | Freq: Once | INTRAMUSCULAR | Status: AC
  Administered 2017-05-02: 80 mg via INTRAMUSCULAR

## 2017-05-02 MED ORDER — LEVALBUTEROL HCL 0.63 MG/3ML IN NEBU
0.6300 mg | INHALATION_SOLUTION | Freq: Once | RESPIRATORY_TRACT | Status: AC
  Administered 2017-05-02: 0.63 mg via RESPIRATORY_TRACT

## 2017-05-02 MED ORDER — FLUTICASONE-UMECLIDIN-VILANT 100-62.5-25 MCG/INH IN AEPB: 1.0000 | INHALATION_SPRAY | Freq: Every day | RESPIRATORY_TRACT | 0 refills | Status: DC

## 2017-05-02 NOTE — Progress Notes (Signed)
HPI F former heavy smoker , former scrub nurse followed for  COPD, lung nodule RLL/ Carcinoid/right lower lobectomy, 61mm RUL nodule , Hx PE/ IVC filter, complicated by hx discoid lupus rash, hx bilateral breast Ca lumpectomies, old calcified granulomas PFT 05/16/2012: Mild obstructive airways disease with insignificant response to bronchodilator, normal lung volumes, diffusion mildly reduced. FVC 3.08/96%, FEV1 2.01/86%, FEV1/FVC 0.65. TLC 95%, DLCO 73%. Quant TB Gold assay NEG 05/16/12, CT chest 08/13/12- The nodule also shows no evidence (IR did not do needle bx.) of contrast enhancement. Given all of these factors as well as the  presence of multiple abutting blood vessels which may make the  biopsy of higher risk, decision was made to not perform a percutaneous biopsy V/Q in 2016 intermediate for PE- has filter Office Spirometry 09/10/16-severe obstructive airways disease. FVC 1.91/54%, FEV1 1.31/42%, ratio 0.76, FEF 25-75% 0.50/23%. --------------------------------------------------------------------------------------------------------------------  04/04/17- 71 yoF  smoker , former scrub nurse followed for  COPD, lung nodule RLL/ Carcinoid/right lower lobectomy, 41mm RUL nodule , Hx PE/ IVC filter, complicated by hx discoid lupus rash, hx bilateral breast Ca lumpectomies, old calcified granulomas ----Recent hospital stay-was told to follow up asap for prednisone rx. Pt is positive for Influenza A-started Tamiflu yesterday.  Combivent, nebulizer albuterol, Comes today wearing a mask after recent diagnosis of influenza a, on Tamiflu.  She was given 2 different prednisone scripts and asks clarification. CXR 03/28/16-images reviewed with her-left lung pneumonia.  Postop changes from previous resection of carcinoid.  05/02/17-71 yoF  smoker , former scrub nurse followed for  COPD, lung nodule RLL/ Carcinoid/right lower lobectomy, 63mm RUL nodule , Hx PE/ IVC filter, complicated by hx discoid lupus rash, hx  bilateral breast Ca lumpectomies, old calcified granulomas  ----COPD mixed type: Pt states her breathing is not good-was recently in [facility] 8 days due to PNA and flu and resp failure.  Has runny nose all the time and notices a clacking soound when she breathes.  Combivent Respimat, albuterol nebulizer Despite her health problems she continues to smoke against advice.  Using nebulizer 2-4 times daily and Combivent Respimat.  Hospitalized with pneumonia in February.  Cough productive of nonpurulent, no blood.  Denies adenopathy, fever.  ROS-see HPI + = positive Constitutional:   No-   weight loss, night sweats, fevers, chills, + fatigue, lassitude. HEENT:   No-  headaches, difficulty swallowing, tooth/dental problems, sore throat,       No-  sneezing, itching, ear ache, nasal congestion, post nasal drip,  CV:  chest pain, orthopnea, PND, swelling in lower extremities, anasarca,  dizziness, palpitations Resp: +shortness of breath with exertion or at rest.              +cough,  No non-productive cough,  No- coughing up of blood.              No-   change in color of mucus.  + wheezing.   Skin:  rash or lesions. GI:  No-   heartburn, indigestion, abdominal pain, nausea, vomiting,  GU: . MS:  No-   joint pain or swelling. Neuro-     nothing unusual Psych:  No- change in mood or affect. No depression or anxiety.  No memory loss.  OBJ- Physical Exam General- Alert, Oriented, Affect-appropriate, Distress- none acute, +obese,  Skin- + rash dorsum left hand reported discoid lupus Lymphadenopathy- none Head- atraumatic            Eyes- Gross vision intact, PERRLA, conjunctivae and secretions clear  Ears- Hearing, canals-normal            Nose- Clear, no-Septal dev, mucus, polyps, erosion, perforation             Throat- Mallampati II , mucosa clear , drainage- none, tonsils- atrophic. +Edentulous Neck- flexible , trachea midline, no stridor , thyroid nl, carotid no bruit Chest -  symmetrical excursion , unlabored           Heart/CV- RRR , no murmur , no gallop  , no rub, nl s1 s2                           - JVD- none , edema- none, stasis changes- none, varices- none           Lung-  Wheeze+, cough none, dullness-none, rub- none,  + coarse breath sounds L>R           Chest wall-  Abd-  Br/ Gen/ Rectal- Not done, not indicated Extrem- cyanosis- none, clubbing, none, atrophy- none, strength- nl.               + bilateral TKR scars, + crutches Neuro- grossly intact to observation

## 2017-05-02 NOTE — Patient Instructions (Signed)
Order- neb xop 0.63     Dx COPD exacerbation             Depo 46  Order- CXR      Dx  COPD exacerbation  Order lab- CBC w diff, BMET, D-dimer, BNP  Sample Trelegy inhaler and script      Inhale 1 puff, once daily      If the sample helps, you can get the script filled  Ok to continue using the Combivent and nebulizer treatments as before

## 2017-05-03 ENCOUNTER — Ambulatory Visit (HOSPITAL_COMMUNITY): Payer: Medicare Other

## 2017-05-03 LAB — D-DIMER, QUANTITATIVE (NOT AT ARMC): D DIMER QUANT: 0.55 ug{FEU}/mL — AB (ref <0.50)

## 2017-05-05 NOTE — Assessment & Plan Note (Signed)
Not clear if she has an infection or other basis for exacerbation. Plan-labs-CBC with differential, chemistry,BNP, Ddimer          CXR Try Trelegy sample with prescription if she likes it

## 2017-05-09 ENCOUNTER — Ambulatory Visit: Payer: Medicare Other | Admitting: Family Medicine

## 2017-05-14 ENCOUNTER — Telehealth (HOSPITAL_COMMUNITY): Payer: Self-pay

## 2017-05-14 NOTE — Telephone Encounter (Signed)
Attempted to call patient to follow up in regards to Breanna Dixon on vm

## 2017-05-20 ENCOUNTER — Other Ambulatory Visit: Payer: Self-pay

## 2017-05-20 MED ORDER — METOPROLOL SUCCINATE ER 100 MG PO TB24: 100.0000 mg | ORAL_TABLET | Freq: Every day | ORAL | 1 refills | Status: DC

## 2017-05-20 MED ORDER — RANITIDINE HCL 150 MG PO CAPS: 150.0000 mg | ORAL_CAPSULE | Freq: Two times a day (BID) | ORAL | 1 refills | Status: DC

## 2017-05-20 MED ORDER — PREGABALIN 100 MG PO CAPS: 100.0000 mg | ORAL_CAPSULE | Freq: Two times a day (BID) | ORAL | 1 refills | Status: DC

## 2017-05-24 ENCOUNTER — Emergency Department (HOSPITAL_COMMUNITY): Payer: Medicare Other

## 2017-05-24 ENCOUNTER — Emergency Department (HOSPITAL_COMMUNITY)
Admission: EM | Admit: 2017-05-24 | Discharge: 2017-05-24 | Disposition: A | Discharge status: Home / Self Care | Payer: Medicare Other | Attending: Emergency Medicine | Admitting: Emergency Medicine

## 2017-05-24 ENCOUNTER — Encounter (HOSPITAL_COMMUNITY): Payer: Self-pay | Admitting: Emergency Medicine

## 2017-05-24 DIAGNOSIS — S7002XA Contusion of left hip, initial encounter: Secondary | ICD-10-CM | POA: Diagnosis not present

## 2017-05-24 DIAGNOSIS — Z96642 Presence of left artificial hip joint: Secondary | ICD-10-CM | POA: Insufficient documentation

## 2017-05-24 DIAGNOSIS — J449 Chronic obstructive pulmonary disease, unspecified: Secondary | ICD-10-CM | POA: Diagnosis not present

## 2017-05-24 DIAGNOSIS — F17210 Nicotine dependence, cigarettes, uncomplicated: Secondary | ICD-10-CM | POA: Diagnosis not present

## 2017-05-24 DIAGNOSIS — Z85118 Personal history of other malignant neoplasm of bronchus and lung: Secondary | ICD-10-CM | POA: Diagnosis not present

## 2017-05-24 DIAGNOSIS — Y929 Unspecified place or not applicable: Secondary | ICD-10-CM | POA: Insufficient documentation

## 2017-05-24 DIAGNOSIS — Z79899 Other long term (current) drug therapy: Secondary | ICD-10-CM | POA: Insufficient documentation

## 2017-05-24 DIAGNOSIS — Z853 Personal history of malignant neoplasm of breast: Secondary | ICD-10-CM | POA: Diagnosis not present

## 2017-05-24 DIAGNOSIS — W101XXA Fall (on)(from) sidewalk curb, initial encounter: Secondary | ICD-10-CM | POA: Insufficient documentation

## 2017-05-24 DIAGNOSIS — Y999 Unspecified external cause status: Secondary | ICD-10-CM | POA: Insufficient documentation

## 2017-05-24 DIAGNOSIS — W19XXXA Unspecified fall, initial encounter: Secondary | ICD-10-CM

## 2017-05-24 DIAGNOSIS — I10 Essential (primary) hypertension: Secondary | ICD-10-CM | POA: Diagnosis not present

## 2017-05-24 DIAGNOSIS — S0990XA Unspecified injury of head, initial encounter: Secondary | ICD-10-CM | POA: Diagnosis present

## 2017-05-24 DIAGNOSIS — S0093XA Contusion of unspecified part of head, initial encounter: Secondary | ICD-10-CM | POA: Insufficient documentation

## 2017-05-24 DIAGNOSIS — Y9301 Activity, walking, marching and hiking: Secondary | ICD-10-CM | POA: Insufficient documentation

## 2017-05-24 DIAGNOSIS — S40012A Contusion of left shoulder, initial encounter: Secondary | ICD-10-CM | POA: Diagnosis not present

## 2017-05-24 DIAGNOSIS — Z96653 Presence of artificial knee joint, bilateral: Secondary | ICD-10-CM | POA: Diagnosis not present

## 2017-05-24 MED ORDER — HYDROMORPHONE HCL 2 MG/ML IJ SOLN
1.0000 mg | Freq: Once | INTRAMUSCULAR | Status: AC
  Administered 2017-05-24: 1 mg via INTRAVENOUS
  Filled 2017-05-24: qty 1

## 2017-05-24 MED ORDER — ONDANSETRON HCL 4 MG/2ML IJ SOLN
4.0000 mg | Freq: Once | INTRAMUSCULAR | Status: AC
  Administered 2017-05-24: 4 mg via INTRAVENOUS
  Filled 2017-05-24: qty 2

## 2017-05-24 NOTE — ED Notes (Signed)
Pt is not on anticoagulation.

## 2017-05-24 NOTE — ED Triage Notes (Signed)
Pt to ER for evaluation of left shoulder and hip pain after a mechanical fall. States was walking without paying attention and stepped off the curb striking her left side and face. Denies neck pain, in c-collar for precautions. EMS states stabilized her pelvis with a sheet and all hip pain ceased. Hx of hip replacement last August (09/2016). Pt a/o x4 on arrival NAD.

## 2017-05-24 NOTE — Discharge Instructions (Addendum)
Return if any problems.

## 2017-05-24 NOTE — ED Provider Notes (Signed)
[place] EMERGENCY DEPARTMENT Provider Note   CSN: [id number] Arrival date & time: 05/24/17  1005     History   Chief Complaint Chief Complaint  Patient presents with  . Fall    HPI Breanna Dixon is a 72 y.o. female.  The history is provided by the patient. No language interpreter was used.  Fall  This is a new problem. The current episode started less than 1 hour ago. The problem has not changed since onset.Pertinent negatives include no chest pain and no abdominal pain. Nothing aggravates the symptoms. Nothing relieves the symptoms. She has tried nothing for the symptoms. The treatment provided no relief.   Pt reports she was not watching where she was walking and she tripped over the curb.  Pt had a hip replacement by Dr. Mayer Camel and she is concerned about her hip.  Pt's son reports pts head hit the ground and it sounded like a exploding pumpkin.  Past Medical History:  Diagnosis Date  . Anxiety    takes Ativan daily, Panic attack   . Arthritis   . Bleeding ulcer 1974  . Breast cancer ([place])    right breast 2000  . Cancer [facility])    Surgery only, Lung Cancer- surgery only  . COPD (chronic obstructive pulmonary disease) ([place])   . Depression   . Fibromyalgia    takes Lyrica daily  . Head injury with loss of consciousness ([place])   . Headache(784.0)    takes Tegretol and Verapamil nightly  . History of blood transfusion    no abnormal reaction noted  . History of bronchitis   . History of colon polyps    benign  . History of kidney stones    several  . History of ulcer disease    pyloric   . Hyperlipidemia    takes Fenofibrate daily  . Hypertension    takes Toprol daily  . Hypertension   . Joint pain   . Lung mass April 2013   Right lower lobe lung mass  . Lupus ([place])    takes Cymbalta,Plaquenil  daily  . Pneumonia    hx of  . PTSD (post-traumatic stress disorder)    takes Cymbalta daily  . Pulmonary embolism ([place]) 2006   IVC filter  .  Seasonal allergies    flonase prn  . Vertigo    takes Antivert prn    Patient Active Problem List   Diagnosis Date Noted  . Healthcare maintenance 04/19/2017  . Hypoxia   . Snoring 01/10/2017  . Primary osteoarthritis of left hip 09/26/2016  . Osteoarthritis of left hip 09/21/2016  . Right-sided chest wall pain 11/04/2014  . S/P lobectomy of lung-carcinoid 2016 07/15/2014  . Granulomatous lung disease ([place]) 05/25/2012  . Breast mass, left UOQ 01/23/2012  . Acute respiratory failure with hypoxia ([place]) 01/05/2012  . COPD mixed type ([place]) 01/05/2012  . Tobacco abuse 01/05/2012  . Hypertension   . 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 ABDOMINAL HYSTERECTOMY  1992  . APPENDECTOMY  1965  . BACK SURGERY  2000/2010   fusion  . BREAST EXCISIONAL BIOPSY    . BREAST LUMPECTOMY    . BREAST SURGERY     bilateral partial masectomy  . CARDIAC CATHETERIZATION  1992   no PCI  . CHOLECYSTECTOMY  1994  . COLONOSCOPY    . CYSTOSCOPY     multiple  . DILATION AND CURETTAGE OF UTERUS    .  HERNIA REPAIR    . INCISIONAL HERNIA REPAIR    . IVC filter    . JOINT REPLACEMENT Bilateral    Knees  . KNEE ARTHROSCOPY Bilateral   . MULTIPLE TOOTH EXTRACTIONS  1990  . THORACOTOMY/LOBECTOMY Right 07/15/2014   Procedure: RIGHT THORACOTOMY/RIGHT LOWER LOBE  LOBECTOMY;  Surgeon: Gaye Pollack, MD;  Location: [facility];  Service: Thoracic;  Laterality: Right;  . TONSILLECTOMY  1967  . TOTAL HIP ARTHROPLASTY Left 09/26/2016   Procedure: TOTAL HIP ARTHROPLASTY ANTERIOR APPROACH;  Surgeon: Frederik Pear, MD;  Location: [place];  Service: Orthopedics;  Laterality: Left;  . TOTAL KNEE ARTHROPLASTY  06/04/2011   Procedure: TOTAL KNEE ARTHROPLASTY;  Surgeon: Rudean Haskell, MD;  Location: [place];  Service: Orthopedics;  Laterality: Right;  . TOTAL KNEE ARTHROPLASTY  10/15/2011   Procedure: TOTAL KNEE ARTHROPLASTY;  Surgeon: Rudean Haskell, MD;  Location: [place];   Service: Orthopedics;  Laterality: Left;  . [place]  . VENA CAVA FILTER PLACEMENT  2006  . VIDEO BRONCHOSCOPY WITH ENDOBRONCHIAL NAVIGATION N/A 06/02/2014   Procedure: VIDEO BRONCHOSCOPY WITH ENDOBRONCHIAL NAVIGATION;  Surgeon: Collene Gobble, MD;  Location: [facility];  Service: Thoracic;  Laterality: N/A;     OB History   None      Home Medications    Prior to Admission medications   Medication Sig Start Date End Date Taking? Authorizing Provider  albuterol (PROVENTIL) (2.5 MG/3ML) 0.083% nebulizer solution Take 3 mLs (2.5 mg total) by nebulization every 6 (six) hours as needed for wheezing or shortness of breath. 04/02/17   Patrecia Pour, Christean Grief, MD  Brexpiprazole (REXULTI) 2 MG TABS Take 2 mg by mouth daily.    [provider]  carbamazepine (TEGRETOL XR) 100 MG 12 hr tablet Take 100 mg by mouth 3 (three) times daily.    [provider]  clonazePAM (KLONOPIN) 1 MG tablet Take 1 tablet by mouth at bedtime.    [provider]  DULoxetine (CYMBALTA) 60 MG capsule Take 120 mg by mouth at bedtime.     [provider]  fenofibrate (TRICOR) 145 MG tablet Take 1 tablet by mouth daily.    [provider]  Fluticasone-Umeclidin-Vilant (TRELEGY ELLIPTA) 100-62.5-25 MCG/INH AEPB Inhale 1 puff into the lungs daily. 05/02/17   Baird Lyons D, MD  Fluticasone-Umeclidin-Vilant (TRELEGY ELLIPTA) 100-62.5-25 MCG/INH AEPB Inhale 1 puff into the lungs daily. Rise mouth after each use. 05/02/17   Young, Kasandra Knudsen, MD  furosemide (LASIX) 20 MG tablet Take 1 tablet (20 mg total) by mouth daily. 01/12/12   Oswald Hillock, MD  Glycopyrrolate-Formoterol (BEVESPI AEROSPHERE) 9-4.8 MCG/ACT AERO Inhale 2 puffs into the lungs 2 (two) times daily. 01/10/17   Young, Kasandra Knudsen, MD  guaiFENesin-dextromethorphan (ROBITUSSIN DM) 100-10 MG/5ML syrup Take 5 mLs by mouth 4 (four) times daily. 04/02/17   Doreatha Lew, MD  hydroxychloroquine (PLAQUENIL) 200 MG tablet  Take 200 mg by mouth 2 (two) times daily.    [provider]  Ipratropium-Albuterol (COMBIVENT RESPIMAT) 20-100 MCG/ACT AERS respimat INHALE TWO PUFFS INTO LUNGS  EVERY 6 HOURS AS NEEDED FOR SHORTNESS OF BREATH 04/06/15   Baird Lyons D, MD  metoprolol succinate (TOPROL-XL) 100 MG 24 hr tablet Take 1 tablet (100 mg total) by mouth daily. Take with or immediately following a meal. 05/20/17   Guadalupe Dawn, MD  MYRBETRIQ 50 MG TB24 tablet  04/25/17   [provider]  Nebulizers (COMPRESSOR/NEBULIZER) MISC Dx COPD, use as needed 04/02/17  Patrecia Pour, Christean Grief, MD  pregabalin (LYRICA) 100 MG capsule Take 1-2 capsules (100-200 mg total) by mouth 2 (two) times daily. 1 tab in the am and 2 tabs at night. 05/20/17   Guadalupe Dawn, MD  QUEtiapine (SEROQUEL) 50 MG tablet Take 50 mg by mouth at bedtime.    [provider]  ranitidine (ZANTAC) 150 MG capsule Take 1 capsule (150 mg total) by mouth 2 (two) times daily. 05/20/17   Guadalupe Dawn, MD  verapamil (COVERA HS) 240 MG (CO) 24 hr tablet Take 240 mg by mouth at bedtime.    [provider]    Family History Family History  Adopted: Yes  Problem Relation Age of Onset  . Anesthesia problems Neg Hx   . Hypotension Neg Hx   . Malignant hyperthermia Neg Hx   . Pseudochol deficiency Neg Hx     Social History Social History   Tobacco Use  . Smoking status: Current Every Day Smoker    Packs/day: 1.00    Types: Cigarettes    Last attempt to quit: 09/20/2016    Years since quitting: 0.6  . Smokeless tobacco: Never Used  . Tobacco comment: pt started back smoking 08/08/16  Substance Use Topics  . Alcohol use: No    Alcohol/week: 0.0 oz  . Drug use: No     Allergies   Imitrex [sumatriptan base]; Ketorolac tromethamine; Stadol [butorphanol tartrate]; Ivp dye [iodinated diagnostic agents]; and Shellfish allergy   Review of Systems Review of Systems  Cardiovascular: Negative for chest pain.  Gastrointestinal:  Negative for abdominal pain.  All other systems reviewed and are negative.    Physical Exam Updated Vital Signs BP 130/70 (BP Location: Right Arm)   Pulse 67   Temp 98 F (36.7 C) (Oral)   Resp 18   LMP 06/04/2011   SpO2 96%   Physical Exam  Constitutional: She is oriented to person, place, and time. She appears well-developed and well-nourished.  HENT:  Head: Normocephalic.  Right Ear: External ear normal.  Left Ear: External ear normal.  Mouth/Throat: Oropharynx is clear and moist.  Eyes: Pupils are equal, round, and reactive to light.  Neck: Normal range of motion.  Cardiovascular: Normal rate and regular rhythm.  Pulmonary/Chest: Effort normal.  Abdominal: Soft.  Musculoskeletal: Normal range of motion.  Tender left shoulder,  Tender left hip,  From ,  No bruising,  Pt able to stand and ambulate without assistance.   Neurological: She is alert and oriented to person, place, and time.  Skin: Skin is warm.  Psychiatric: She has a normal mood and affect.  Nursing note and vitals reviewed.    ED Treatments / Results  Labs (all labs ordered are listed, but only abnormal results are displayed) Labs Reviewed - No data to display  EKG None  Radiology Ct Head Wo Contrast  Result Date: 05/24/2017 CLINICAL DATA:  [place] stepping off the curb, head trauma, headache, generalized neck pain, history breast cancer, COPD, hypertension EXAM: CT HEAD WITHOUT CONTRAST CT CERVICAL SPINE WITHOUT CONTRAST TECHNIQUE: Multidetector CT imaging of the head and cervical spine was performed following the standard protocol without intravenous contrast. Multiplanar CT image reconstructions of the cervical spine were also generated. COMPARISON:  None FINDINGS: CT HEAD FINDINGS Brain: Mild generalized atrophy. Normal ventricular morphology. No midline shift or mass effect. Otherwise normal appearance of brain parenchyma. Minimal falcine calcification. No intracranial hemorrhage, mass lesion, or  evidence of acute infarction. No extra-axial fluid collections. Vascular: Atherosclerotic calcifications within internal carotid  arteries at skull base Skull: Intact Sinuses/Orbits: Clear.  Nasal septal deviation to the RIGHT. Other: N/A CT CERVICAL SPINE FINDINGS Alignment: Normal Skull base and vertebrae: Mild osseous demineralization. Skull base intact. Vertebral body heights maintained. Multilevel disc space narrowing and tiny scattered endplate spurs. Encroachment upon cervical neural foramina bilaterally by uncovertebral spur formation. Mild scattered facet degenerative changes. No acute fracture, subluxation, or bone destruction. Soft tissues and spinal canal: Prevertebral soft tissues normal thickness. Atherosclerotic calcifications identified within BILATERAL carotid bulbs and proximal internal carotid arteries, which are located retropharyngeal and nearly touching at midline. Remaining visualized cervical soft tissues unremarkable. Disc levels:  No additional abnormalities Upper chest: Tips of lung apices clear. Other: N/A IMPRESSION: Mild generalized atrophy. No acute intracranial abnormalities. Degenerative disc and facet disease changes of the cervical spine. No acute cervical spine abnormalities. Electronically Signed   By: Lavonia Dana M.D.   On: 05/24/2017 12:21   Ct Cervical Spine Wo Contrast  Result Date: 05/24/2017 CLINICAL DATA:  [place] stepping off the curb, head trauma, headache, generalized neck pain, history breast cancer, COPD, hypertension EXAM: CT HEAD WITHOUT CONTRAST CT CERVICAL SPINE WITHOUT CONTRAST TECHNIQUE: Multidetector CT imaging of the head and cervical spine was performed following the standard protocol without intravenous contrast. Multiplanar CT image reconstructions of the cervical spine were also generated. COMPARISON:  None FINDINGS: CT HEAD FINDINGS Brain: Mild generalized atrophy. Normal ventricular morphology. No midline shift or mass effect. Otherwise normal appearance  of brain parenchyma. Minimal falcine calcification. No intracranial hemorrhage, mass lesion, or evidence of acute infarction. No extra-axial fluid collections. Vascular: Atherosclerotic calcifications within internal carotid arteries at skull base Skull: Intact Sinuses/Orbits: Clear.  Nasal septal deviation to the RIGHT. Other: N/A CT CERVICAL SPINE FINDINGS Alignment: Normal Skull base and vertebrae: Mild osseous demineralization. Skull base intact. Vertebral body heights maintained. Multilevel disc space narrowing and tiny scattered endplate spurs. Encroachment upon cervical neural foramina bilaterally by uncovertebral spur formation. Mild scattered facet degenerative changes. No acute fracture, subluxation, or bone destruction. Soft tissues and spinal canal: Prevertebral soft tissues normal thickness. Atherosclerotic calcifications identified within BILATERAL carotid bulbs and proximal internal carotid arteries, which are located retropharyngeal and nearly touching at midline. Remaining visualized cervical soft tissues unremarkable. Disc levels:  No additional abnormalities Upper chest: Tips of lung apices clear. Other: N/A IMPRESSION: Mild generalized atrophy. No acute intracranial abnormalities. Degenerative disc and facet disease changes of the cervical spine. No acute cervical spine abnormalities. Electronically Signed   By: Lavonia Dana M.D.   On: 05/24/2017 12:21   Dg Shoulder Left  Result Date: 05/24/2017 CLINICAL DATA:  Left shoulder pain after fall. EXAM: LEFT SHOULDER - 2+ VIEW COMPARISON:  02/15/2014 FINDINGS: Degenerative changes in the [place] and glenohumeral joints. Joint space narrowing in the subacromial spurring within the left AC joint. Mild joint space narrowing in the glenohumeral joint. No acute bony abnormality. Specifically, no fracture, subluxation, or dislocation. IMPRESSION: Degenerative changes in the left shoulder. No acute bony abnormality. Electronically Signed   By: Rolm Baptise M.D.    On: 05/24/2017 11:34   Dg Hip Unilat W Or Wo Pelvis 2-3 Views Left  Result Date: 05/24/2017 CLINICAL DATA:  Fall, left hip pain EXAM: DG HIP (WITH OR WITHOUT PELVIS) 2-3V LEFT COMPARISON:  12/25/2016 FINDINGS: Prior left hip replacement. No hardware or bony complicating feature. No acute fracture, subluxation or dislocation. Postoperative changes in the lumbar spine. IVC filter partially visualized. IMPRESSION: Left hip replacement.  No acute bony abnormality. Electronically Signed  By: Rolm Baptise M.D.   On: 05/24/2017 11:36    Procedures Procedures (including critical care time)  Medications Ordered in ED Medications  HYDROmorphone (DILAUDID) injection 1 mg (1 mg Intravenous Given 05/24/17 1043)  ondansetron (ZOFRAN) injection 4 mg (4 mg Intravenous Given 05/24/17 1043)     Initial Impression / Assessment and Plan / ED Course  I have reviewed the triage vital signs and the nursing notes.  Pertinent labs & imaging results that were available during my care of the patient were reviewed by me and considered in my medical decision making (see chart for details).     MDM   Pt feels well.  Pt advised to follow up with her Md for recheck.   Final Clinical Impressions(s) / ED Diagnoses   Final diagnoses:  Fall, initial encounter  Contusion of head, unspecified part of head, initial encounter  Contusion of left hip, initial encounter  Contusion of left shoulder, initial encounter    ED Discharge Orders    None    An After Visit Summary was printed and given to the patient.    Fransico Meadow, PA-C 05/24/17 1550    Gareth Morgan, MD 05/25/17 [phone]

## 2017-05-28 ENCOUNTER — Telehealth (HOSPITAL_COMMUNITY): Payer: Self-pay

## 2017-05-28 NOTE — Telephone Encounter (Signed)
Called and spoke with patient in regards to Pulmonary Rehab - Scheduled orientation on 06/28/2017 at 9:30am. Patient will attend the 10:30am exc class. Mailed packet.

## 2017-05-29 ENCOUNTER — Telehealth: Payer: Self-pay

## 2017-05-29 NOTE — Telephone Encounter (Signed)
Patient left message on nurse line that she needs an order for adult diapers. States that [employer] will be sending paperwork for her to obtain these.  Call back is [phone].  Danley Danker, RN [facility] RN)

## 2017-05-31 NOTE — Telephone Encounter (Signed)
I have filled out the form and placed it in the box to be faxed.   Guadalupe Dawn MD PGY-1 Family Medicine Resident

## 2017-06-06 ENCOUNTER — Other Ambulatory Visit: Payer: Self-pay

## 2017-06-06 ENCOUNTER — Encounter: Payer: Self-pay | Admitting: Family Medicine

## 2017-06-06 ENCOUNTER — Ambulatory Visit (INDEPENDENT_AMBULATORY_CARE_PROVIDER_SITE_OTHER): Payer: Medicare Other | Admitting: Family Medicine

## 2017-06-06 VITALS — BP 120/70 | HR 63 | Temp 98.3°F | Ht 67.0 in | Wt 211.0 lb

## 2017-06-06 DIAGNOSIS — K582 Mixed irritable bowel syndrome: Secondary | ICD-10-CM

## 2017-06-06 DIAGNOSIS — Z716 Tobacco abuse counseling: Secondary | ICD-10-CM | POA: Diagnosis not present

## 2017-06-06 DIAGNOSIS — W19XXXD Unspecified fall, subsequent encounter: Secondary | ICD-10-CM

## 2017-06-06 NOTE — Patient Instructions (Signed)
It was great seeing you again today! I am glad that things have been going well aside from your fall. I believe that your bruise looks about how I would expect it at this stage of the game and I am not concerned about it. It can sometimes take up to a month to resolve. Regarding your IBS, I think making a food diary is a good idea. This can give [place] insight into any possible foods that can trigger your symptoms. I would try to avoid these foods and eat plenty of fiber if possible. If symptoms persist we can discuss other methods at a future visit. Regarding your smoking cessation we went through a little of the science about why nicotine is so hard to stop. We also discussed chantix and nicotine replacement therapy options. I think trying to quit cold [place] is a reasonable option but just know that if you are having a really hard time the clinic is here to help.   If you call 1-800-QUIT-NOW there are trained counselors that can help you as well. They are available 24 hours per day. I will see you back as needed.

## 2017-06-09 DIAGNOSIS — W19XXXA Unspecified fall, initial encounter: Secondary | ICD-10-CM | POA: Insufficient documentation

## 2017-06-09 DIAGNOSIS — Z716 Tobacco abuse counseling: Secondary | ICD-10-CM | POA: Insufficient documentation

## 2017-06-09 DIAGNOSIS — K582 Mixed irritable bowel syndrome: Secondary | ICD-10-CM | POA: Insufficient documentation

## 2017-06-09 NOTE — Assessment & Plan Note (Signed)
Counseled patient on the use of a food diary to record when she has exacerbations. Also gave recommendations for over the counter medications she can take depending on her symptoms. If bloating can take simethicone, if diarrhea can take imodium, if constipation recommended miralax.

## 2017-06-09 NOTE — Assessment & Plan Note (Signed)
Would like to try and quit cold [place]. Gave counseling on options if she is unable to quit. She will follow up with me as needed. Alternatively she did express interest in pharmacy clinic for smoking cessation. Gave her information on 1-800-QUIT-NOW

## 2017-06-09 NOTE — Progress Notes (Signed)
   HPI 72 year old who presents initially for smoking cessation follow up. Patient is interested in quitting smoking. She has quit cold [place] in the past but start smoking again a few years ago. She would like to try and quit on her own.   Her additional complaints are a check from a fall she sustained on 4/19. She was walking in a parking garage and her feet "slipped out from under her". She underwent CT head/c-spine/right shoulder/ right hip imaging which was all normal. She says that she is feeling much better and her only real sequelae from the fall are a right and left hip bruise. No gait abnormalities or any limitations at this time  Patient states that she has been having increased instances of diarrhea and constipation. Her most troublesome complaint to her is that she has been having increased bloating and is worried that she gained weight. Has actually lost 4 pounds since last visit.  CC: smoking cessation/fall f/u/ IBS   ROS:  Review of Systems See HPI for ROS.   CC, SH/smoking status, and VS noted  Objective: BP 120/70 (BP Location: Left Arm, Patient Position: Sitting, Cuff Size: Large)   Pulse 63   Temp 98.3 F (36.8 C) (Oral)   Ht 5\' 7"  (1.702 m)   Wt 95.7 kg (211 lb)   LMP 06/04/2011   SpO2 97%   BMI 33.05 kg/m  Gen: NAD, alert, cooperative, and pleasant. Well appearing, caucasian female HEENT: NCAT, EOMI, PERRL CV: RRR, no murmur Resp: CTAB, no wheezes, non-labored Abd: SNTND, BS present, no guarding or organomegaly Ext: No edema, warm Neuro: Alert and oriented, Speech clear, No gross deficits Bilateral hips: well healing resolving ecchymosis    Assessment and plan:  No problem-specific Assessment & Plan notes found for this encounter.   No orders of the defined types were placed in this encounter.   No orders of the defined types were placed in this encounter.    Guadalupe Dawn MD PGY-1 Family Medicine Resident  06/09/2017 3:56 PM

## 2017-06-09 NOTE — Assessment & Plan Note (Signed)
Well healing with no limitations. Pain mostly resolved. Follow up prn.

## 2017-06-25 ENCOUNTER — Other Ambulatory Visit: Payer: Self-pay

## 2017-06-26 MED ORDER — IPRATROPIUM-ALBUTEROL 20-100 MCG/ACT IN AERS: INHALATION_SPRAY | RESPIRATORY_TRACT | 5 refills | Status: DC

## 2017-06-26 MED ORDER — HYDROXYCHLOROQUINE SULFATE 200 MG PO TABS: 200.0000 mg | ORAL_TABLET | Freq: Two times a day (BID) | ORAL | 2 refills | Status: DC

## 2017-06-27 ENCOUNTER — Other Ambulatory Visit: Payer: Self-pay | Admitting: *Deleted

## 2017-06-28 ENCOUNTER — Encounter (HOSPITAL_COMMUNITY)
Admission: RE | Admit: 2017-06-28 | Discharge: 2017-06-28 | Disposition: A | Discharge status: Home / Self Care | Payer: Medicare Other | Source: Ambulatory Visit | Attending: Internal Medicine | Admitting: Internal Medicine

## 2017-06-28 ENCOUNTER — Encounter (HOSPITAL_COMMUNITY): Payer: Self-pay

## 2017-06-28 VITALS — BP 126/45 | HR 60 | Temp 98.2°F | Resp 14 | Ht 66.0 in | Wt 215.4 lb

## 2017-06-28 DIAGNOSIS — J449 Chronic obstructive pulmonary disease, unspecified: Secondary | ICD-10-CM

## 2017-06-28 MED ORDER — HYDROXYCHLOROQUINE SULFATE 200 MG PO TABS: 200.0000 mg | ORAL_TABLET | Freq: Two times a day (BID) | ORAL | 2 refills | Status: DC

## 2017-06-28 NOTE — Progress Notes (Signed)
Breanna Dixon 72 y.o. female Pulmonary Rehab Orientation Note Patient arrived today in Cardiac and Pulmonary Rehab for orientation to Pulmonary Rehab. She was transported from [employer] via wheel chair. She does not carry portable oxygen. Per pt, she does not use oxygen . Color good, skin warm and dry. Patient is oriented to time and place. Patient's medical history, psychosocial health, and medications reviewed. Pt set a quit smoking date for her upcoming birthday [birth year]. Smoking cessation counseling provided. Pt instructed to call 1800Quitnow.  Pt seems ready to quit.  Pt given fake cigarette to use.  Pt son and roommate that live with her also smoke.  Sent with pt handout on stop smoking and fake cigarettes. Pt offered emotional support and reassurance.  Psychosocial assessment reveals pt lives with their son and female roommate. Pt is currently retired. Pt hobbies include reading. Pt reports her stress level is high. Areas of stress/anxiety include Family.  Pt does exhibit  signs of depression. Signs of depression include crying spells, irritability and sadness and difficulty falling asleep and difficulty maintaining sleep. PHQ2/9 score 3/8. Pt shows fair  coping skills with positive outlook due to her faith.  Pt has multiple stressors due to intra family strain with her son and daughter.  Pt has financial and transportation stress . Pt offered extensive emotional support and reassurance. Will continue to monitor and evaluate progress toward psychosocial goal(s) of decrease stress as this impacts her quality of life and ability to perform activities of daily living. Physical assessment reveals heart rate is normal, breath sounds clear to auscultation, rales, or rhonchi.  Pt with exp wheezes in the right upper lobes that cleared with deep breathing. Grip strength equal, strong. Distal pulses palpable. Patient reports she does take medications as prescribed. Patient states she follows a Regular diet but also  has a tendency when stressed lose her appetite.  As a result of her stress with family she has lost 40 pounds due to not eating regular meals. Patient's weight will be monitored closely. Demonstration and practice of PLB using pulse oximeter. Pt does not have a pulse oximeter at home.  Encouraged pt to purchase one.  Patient able to return demonstration satisfactorily. Safety and hand hygiene in the exercise area reviewed with patient. Patient voices understanding of the information reviewed. Department expectations discussed with patient and achievable goals were set. The patient shows enthusiasm about attending the program and we look forward to working with this nice patient. The patient is scheduled for a 6 min walk test on 5/28 at 3:15 and to begin exercise on 6/4 at 10:30.  45 minutes was spent on a variety of activities such as assessment of the patient, obtaining baseline data including height, weight, BMI, and grip strength, verifying medical history, allergies, and current medications, and teaching patient strategies for performing tasks with less respiratory effort with emphasis on pursed lip breathing. Cherre Huger, BSN Cardiac and Training and development officer

## 2017-07-02 ENCOUNTER — Encounter (HOSPITAL_COMMUNITY)
Admission: RE | Admit: 2017-07-02 | Discharge: 2017-07-02 | Disposition: A | Discharge status: Home / Self Care | Payer: Medicare Other | Source: Ambulatory Visit | Attending: Internal Medicine | Admitting: Internal Medicine

## 2017-07-02 DIAGNOSIS — J449 Chronic obstructive pulmonary disease, unspecified: Secondary | ICD-10-CM

## 2017-07-02 NOTE — Progress Notes (Signed)
S:  No complaints, walks 3 blocks before stopping and a full flight of stairs before stopping.mostly due to pain from multiple joint replacement.  O: Chronically ill appearing female, NAD Heart: RRR, Nl S1/S2 and -M/R/G Lungs: Prolonged expiratory phase Abdomen: Soft, NT, ND and +BS Ext: bilateral knee replacement scars, no edema.  A/P: 72 year female with COPD that is here for cardiopulmonary rehab.  I see no limitation on her exercise so far as patient has no CP complaints.  Seems there is quite a bit of limitation from the musculoskeletal complaints rather than respiratory.  Proceed with exercise program.  Rush Farmer, M.D. [facility] Pulmonary/Critical Care Medicine. Pager: [phone]. After hours pager: [phone]

## 2017-07-04 NOTE — Progress Notes (Signed)
Breanna Dixon 72 y.o. female  DOB: 05/21/1945 MRN: [id number]           Nutrition Note 1. Stage 3 severe COPD by GOLD classification [facility])    Past Medical History:  Diagnosis Date  . Anxiety    takes Ativan daily, Panic attack   . Arthritis   . Bleeding ulcer 1974  . Breast cancer ([place])    right breast 2000  . Cancer [facility])    Surgery only, Lung Cancer- surgery only  . COPD (chronic obstructive pulmonary disease) ([place])   . Depression   . Fibromyalgia    takes Lyrica daily  . Head injury with loss of consciousness ([place])   . Headache(784.0)    takes Tegretol and Verapamil nightly  . History of blood transfusion    no abnormal reaction noted  . History of bronchitis   . History of colon polyps    benign  . History of kidney stones    several  . History of ulcer disease    pyloric   . Hyperlipidemia    takes Fenofibrate daily  . Hypertension    takes Toprol daily  . Hypertension   . Joint pain   . Lung mass April 2013   Right lower lobe lung mass  . Lupus ([place])    takes Cymbalta,Plaquenil  daily  . Pneumonia    hx of  . PTSD (post-traumatic stress disorder)    takes Cymbalta daily  . Pulmonary embolism ([place]) 2006   IVC filter  . Seasonal allergies    flonase prn  . Vertigo    takes Antivert prn   Meds reviewed.   Ht: Ht Readings from Last 1 Encounters:  06/28/17 5\' 6"  (1.676 m)    Wt:  Wt Readings from Last 3 Encounters:  06/28/17 215 lb 6.2 oz (97.7 kg)  06/06/17 211 lb (95.7 kg)  05/02/17 214 lb 6.4 oz (97.3 kg)    BMI: 34.78    Current tobacco use? Yes  Labs:  Lipid Panel  02/28/2015  Total chol  214    Triglycerides   282    HDL    83   LDL    74  07/31/2016 HGBA1C 5.7  Nutrition Diagnosis ? Food-and nutrition-related knowledge deficit related to lack of exposure to information as related to diagnosis of pulmonary disease ? Obesity related to excessive energy intake as evidenced by a BMI of 34.78  Goal(s) 1. To be determined with  pt  Plan:  Pt to attend Pulmonary Nutrition class Will provide client-centered nutrition education as part of interdisciplinary care.   Monitor and evaluate progress toward nutrition goal with team.  Monitor and Evaluate progress toward nutrition goal with team.   Derek Mound, M.Ed, RD, LDN, CDE 07/04/2017 1:54 PM

## 2017-07-04 NOTE — Progress Notes (Signed)
Pulmonary Individual Treatment Plan  Patient Details  Name: Breanna Dixon MRN: [id number] Date of Birth: 1945-07-04 Referring Provider:     Pulmonary Rehab Walk Test from 07/02/2017 in [place]  Referring Provider  Dr. Annamaria Boots      Initial Encounter Date:    Pulmonary Rehab Walk Test from 07/02/2017 in [place]  Date  07/02/17  Referring Provider  Dr. Annamaria Boots      Visit Diagnosis: Stage 3 severe COPD by GOLD classification ([place])  Patient's Home Medications on Admission:   Current Outpatient Medications:  .  albuterol (PROVENTIL) (2.5 MG/3ML) 0.083% nebulizer solution, Take 3 mLs (2.5 mg total) by nebulization every 6 (six) hours as needed for wheezing or shortness of breath., Disp: 15 mL, Rfl: 1 .  Brexpiprazole (REXULTI) 2 MG TABS, Take 2.5 mg by mouth daily. , Disp: , Rfl:  .  carbamazepine (TEGRETOL XR) 100 MG 12 hr tablet, Take 100 mg by mouth 3 (three) times daily. , Disp: , Rfl:  .  clonazePAM (KLONOPIN) 1 MG tablet, Take 1 tablet by mouth at bedtime. , Disp: , Rfl:  .  DULoxetine (CYMBALTA) 60 MG capsule, Take 120 mg by mouth at bedtime. , Disp: , Rfl:  .  fenofibrate (TRICOR) 145 MG tablet, Take 1 tablet by mouth daily., Disp: , Rfl:  .  Fluticasone-Umeclidin-Vilant (TRELEGY ELLIPTA) 100-62.5-25 MCG/INH AEPB, Inhale 1 puff into the lungs daily. Rise mouth after each use., Disp: 1 each, Rfl: 4 .  furosemide (LASIX) 20 MG tablet, Take 1 tablet (20 mg total) by mouth daily., Disp: 30 tablet, Rfl: 2 .  hydroxychloroquine (PLAQUENIL) 200 MG tablet, Take 1 tablet (200 mg total) by mouth 2 (two) times daily., Disp: 60 tablet, Rfl: 2 .  Ipratropium-Albuterol (COMBIVENT RESPIMAT) 20-100 MCG/ACT AERS respimat, INHALE TWO PUFFS INTO LUNGS  EVERY 6 HOURS AS NEEDED FOR SHORTNESS OF BREATH, Disp: 1 Inhaler, Rfl: 5 .  metoprolol succinate (TOPROL-XL) 100 MG 24 hr tablet, Take 1 tablet (100 mg total) by mouth daily. Take with or  immediately following a meal., Disp: 90 tablet, Rfl: 1 .  MYRBETRIQ 50 MG TB24 tablet, , Disp: , Rfl: 11 .  Nebulizers (COMPRESSOR/NEBULIZER) MISC, Dx COPD, use as needed, Disp: 1 each, Rfl: 0 .  pregabalin (LYRICA) 100 MG capsule, Take 1-2 capsules (100-200 mg total) by mouth 2 (two) times daily. 1 tab in the am and 2 tabs at night. (Patient taking differently: Take 200 mg by mouth 2 (two) times daily. ), Disp: 90 capsule, Rfl: 1 .  QUEtiapine (SEROQUEL) 50 MG tablet, Take 50 mg by mouth at bedtime., Disp: , Rfl:  .  ranitidine (ZANTAC) 150 MG capsule, Take 1 capsule (150 mg total) by mouth 2 (two) times daily., Disp: 90 capsule, Rfl: 1 .  verapamil (COVERA HS) 240 MG (CO) 24 hr tablet, Take 240 mg by mouth at bedtime., Disp: , Rfl:   Past Medical History: Past Medical History:  Diagnosis Date  . Anxiety    takes Ativan daily, Panic attack   . Arthritis   . Bleeding ulcer 1974  . Breast cancer ([place])    right breast 2000  . Cancer [facility])    Surgery only, Lung Cancer- surgery only  . COPD (chronic obstructive pulmonary disease) ([place])   . Depression   . Fibromyalgia    takes Lyrica daily  . Head injury with loss of consciousness ([place])   . Headache(784.0)    takes Tegretol and  Verapamil nightly  . History of blood transfusion    no abnormal reaction noted  . History of bronchitis   . History of colon polyps    benign  . History of kidney stones    several  . History of ulcer disease    pyloric   . Hyperlipidemia    takes Fenofibrate daily  . Hypertension    takes Toprol daily  . Hypertension   . Joint pain   . Lung mass April 2013   Right lower lobe lung mass  . Lupus ([place])    takes Cymbalta,Plaquenil  daily  . Pneumonia    hx of  . PTSD (post-traumatic stress disorder)    takes Cymbalta daily  . Pulmonary embolism ([place]) 2006   IVC filter  . Seasonal allergies    flonase prn  . Vertigo    takes Antivert prn    Tobacco Use: Social History   Tobacco Use  Smoking  Status Current Every Day Smoker  . Packs/day: 1.00  . Types: Cigarettes  . Last attempt to quit: 09/20/2016  . Years since quitting: 0.7  Smokeless Tobacco Never Used  Tobacco Comment   pt started back smoking 08/08/16    Labs: Recent Review Flowsheet Data    Labs for ITP Cardiac and Pulmonary Rehab Latest Ref Rng & Units 01/06/2012 07/13/2014 07/16/2014   PHART 7.350 - 7.450 7.395 7.398 7.324(L)   PCO2ART 35.0 - 45.0 mmHg 37.6 36.0 44.0   HCO3 20.0 - 24.0 mEq/L 22.5 21.7 22.2   TCO2 0 - 100 mmol/L 19.8 22.8 23.6   ACIDBASEDEF 0.0 - 2.0 mmol/L 1.5 2.3(H) 2.9(H)   O2SAT % 94.1 96.2 95.5      Capillary Blood Glucose: Lab Results  Component Value Date   GLUCAP 187 (H) 09/27/2016   GLUCAP 128 (H) 07/19/2014   GLUCAP 95 07/18/2014   GLUCAP 122 (H) 07/18/2014   GLUCAP 109 (H) 07/18/2014     Pulmonary Assessment Scores: Pulmonary Assessment Scores    Row Name 07/04/17 1011         ADL UCSD   ADL Phase  Entry       mMRC Score   mMRC Score  1        Pulmonary Function Assessment:   Exercise Target Goals: Date: 07/02/17  Exercise Program Goal: Individual exercise prescription set using results from initial 6 min walk test and THRR while considering  patient's activity barriers and safety.    Exercise Prescription Goal: Initial exercise prescription builds to 30-45 minutes a day of aerobic activity, 2-3 days per week.  Home exercise guidelines will be given to patient during program as part of exercise prescription that the participant will acknowledge.  Activity Barriers & Risk Stratification: Activity Barriers & Cardiac Risk Stratification - 06/28/17 1103      Activity Barriers & Cardiac Risk Stratification   Activity Barriers  Left Hip Replacement;Shortness of Breath;Left Knee Replacement;Right Knee Replacement;History of Falls;Balance Concerns;Incisional Pain;Back Problems;Arthritis;Fibromyalgia    Cardiac Risk Stratification  High       6 Minute Walk: 6 Minute  Walk    Row Name 07/04/17 0907         6 Minute Walk   Phase  Initial     Distance  736 feet     Walk Time  6 minutes     # of Rest Breaks  - 20 seconds standing     MPH  1.39     METS  2.07  RPE  14     Perceived Dyspnea   2     Symptoms  Yes (comment)     Comments  used wheelchair after 2nd lap     Resting HR  73 bpm     Resting BP  98/58     Resting Oxygen Saturation   94 %     Exercise Oxygen Saturation  during 6 min walk  88 %     Max Ex. HR  91 bpm     Max Ex. BP  130/62       Interval HR   1 Minute HR  91     2 Minute HR  86     3 Minute HR  76     4 Minute HR  78     5 Minute HR  89     6 Minute HR  89     2 Minute Post HR  77     Interval Heart Rate?  Yes       Interval Oxygen   Interval Oxygen?  Yes     Baseline Oxygen Saturation %  94 %     1 Minute Oxygen Saturation %  92 %     1 Minute Liters of Oxygen  0 L     2 Minute Oxygen Saturation %  92 %     2 Minute Liters of Oxygen  0 L     3 Minute Oxygen Saturation %  93 %     3 Minute Liters of Oxygen  0 L     4 Minute Oxygen Saturation %  93 %     4 Minute Liters of Oxygen  0 L     5 Minute Oxygen Saturation %  88 %     5 Minute Liters of Oxygen  0 L     6 Minute Oxygen Saturation %  88 %     6 Minute Liters of Oxygen  0 L     2 Minute Post Oxygen Saturation %  95 %     2 Minute Post Liters of Oxygen  0 L        Oxygen Initial Assessment: Oxygen Initial Assessment - 07/04/17 0906      Initial 6 min Walk   Oxygen Used  None      Program Oxygen Prescription   Program Oxygen Prescription  None did drop to 88% during 6MWT-will keep a close eye on her o2 sats with exercise       Oxygen Re-Evaluation:   Oxygen Discharge (Final Oxygen Re-Evaluation):   Initial Exercise Prescription: Initial Exercise Prescription - 07/04/17 0900      Date of Initial Exercise RX and Referring Provider   Date  07/02/17    Referring Provider  Dr. Annamaria Boots      NuStep   Level  2    SPM  80    Minutes  17     METs  1.5      Arm Ergometer   Level  1    Watts  10    Minutes  17      Track   Laps  5    Minutes  17      Prescription Details   Frequency (times per week)  2    Duration  Progress to 45 minutes of aerobic exercise without signs/symptoms of physical distress      Intensity   THRR 40-80% of Max Heartrate  60-121  Ratings of Perceived Exertion  11-13    Perceived Dyspnea  0-4      Progression   Progression  Continue progressive overload as per policy without signs/symptoms or physical distress.      Resistance Training   Training Prescription  Yes    Weight  orange bands    Reps  10-15       Perform Capillary Blood Glucose checks as needed.  Exercise Prescription Changes:   Exercise Comments:   Exercise Goals and Review: Exercise Goals    Row Name 06/28/17 1108             Exercise Goals   Increase Physical Activity  Yes       Intervention  Provide advice, education, support and counseling about physical activity/exercise needs.;Develop an individualized exercise prescription for aerobic and resistive training based on initial evaluation findings, risk stratification, comorbidities and participant's personal goals.       Expected Outcomes  Short Term: Attend rehab on a regular basis to increase amount of physical activity.;Long Term: Add in home exercise to make exercise part of routine and to increase amount of physical activity.;Long Term: Exercising regularly at least 3-5 days a week.       Increase Strength and Stamina  Yes       Intervention  Provide advice, education, support and counseling about physical activity/exercise needs.;Develop an individualized exercise prescription for aerobic and resistive training based on initial evaluation findings, risk stratification, comorbidities and participant's personal goals.       Expected Outcomes  Short Term: Increase workloads from initial exercise prescription for resistance, speed, and METs.;Short Term:  Perform resistance training exercises routinely during rehab and add in resistance training at home;Long Term: Improve cardiorespiratory fitness, muscular endurance and strength as measured by increased METs and functional capacity (6MWT)       Able to understand and use rate of perceived exertion (RPE) scale  Yes       Intervention  Provide education and explanation on how to use RPE scale       Expected Outcomes  Short Term: Able to use RPE daily in rehab to express subjective intensity level;Long Term:  Able to use RPE to guide intensity level when exercising independently       Able to understand and use Dyspnea scale  Yes       Intervention  Provide education and explanation on how to use Dyspnea scale       Expected Outcomes  Short Term: Able to use Dyspnea scale daily in rehab to express subjective sense of shortness of breath during exertion;Long Term: Able to use Dyspnea scale to guide intensity level when exercising independently       Knowledge and understanding of Target Heart Rate Range (THRR)  Yes       Intervention  Provide education and explanation of THRR including how the numbers were predicted and where they are located for reference       Expected Outcomes  Short Term: Able to state/look up THRR;Long Term: Able to use THRR to govern intensity when exercising independently;Short Term: Able to use daily as guideline for intensity in rehab       Understanding of Exercise Prescription  Yes       Intervention  Provide education, explanation, and written materials on patient's individual exercise prescription       Expected Outcomes  Short Term: Able to explain program exercise prescription;Long Term: Able to explain home exercise prescription to exercise independently  Exercise Goals Re-Evaluation :   Discharge Exercise Prescription (Final Exercise Prescription Changes):   Nutrition:  Target Goals: Understanding of nutrition guidelines, daily intake of sodium 1500mg ,  cholesterol 200mg , calories 30% from fat and 7% or less from saturated fats, daily to have 5 or more servings of fruits and vegetables.  Biometrics:    Nutrition Therapy Plan and Nutrition Goals:   Nutrition Assessments:   Nutrition Goals Re-Evaluation:   Nutrition Goals Discharge (Final Nutrition Goals Re-Evaluation):   Psychosocial: Target Goals: Acknowledge presence or absence of significant depression and/or stress, maximize coping skills, provide positive support system. Participant is able to verbalize types and ability to use techniques and skills needed for reducing stress and depression.  Initial Review & Psychosocial Screening: Initial Psych Review & Screening - 06/28/17 1053      Initial Review   Current issues with  Current Depression;Current Anxiety/Panic;Current Psychotropic Meds;Current Sleep Concerns;Current Stress Concerns    Source of Stress Concerns  Family;Financial;Transportation;Unable to participate in former interests or hobbies;Unable to perform yard/household activities;Chronic Illness;Poor Coping Skills    Comments  Pt with multiple stressors- strained relationships with son and daughte.      Family Dynamics   Good Support System?  No    Strains  Intra-family strains    Comments  support of son who lives with her      Barriers   Psychosocial barriers to participate in program  The patient should benefit from training in stress management and relaxation.      Screening Interventions   Interventions  Encouraged to exercise;To provide support and resources with identified psychosocial needs    Expected Outcomes  Short Term goal: Utilizing psychosocial counselor, staff and physician to assist with identification of specific Stressors or current issues interfering with healing process. Setting desired goal for each stressor or current issue identified.;Long Term Goal: Stressors or current issues are controlled or eliminated.       Quality of Life  Scores:  Scores of 19 and below usually indicate a poorer quality of life in these areas.  A difference of  2-3 points is a clinically meaningful difference.  A difference of 2-3 points in the total score of the Quality of Life Index has been associated with significant improvement in overall quality of life, self-image, physical symptoms, and general health in studies assessing change in quality of life.   PHQ-9: Recent Review Flowsheet Data    Depression screen [facility] An 2/9 06/28/2017 06/06/2017 04/17/2017   Decreased Interest 1 0 0   Down, Depressed, Hopeless 2 0 0   PHQ - 2 Score 3 0 0   Altered sleeping 1 - -   Tired, decreased energy 1 - -   Change in appetite 3 - -   Feeling bad or failure about yourself  0 - -   Trouble concentrating 0 - -   Moving slowly or fidgety/restless 0 - -   Suicidal thoughts 0 - -   PHQ-9 Score 8 - -   Difficult doing work/chores Somewhat difficult - -     Interpretation of Total Score  Total Score Depression Severity:  1-4 = Minimal depression, 5-9 = Mild depression, 10-14 = Moderate depression, 15-19 = Moderately severe depression, 20-27 = Severe depression   Psychosocial Evaluation and Intervention: Psychosocial Evaluation - 06/28/17 1100      Psychosocial Evaluation & Interventions   Interventions  Stress management education;Relaxation education;Encouraged to exercise with the program and follow exercise prescription    Comments  Pt sees  a Product/process development scientist. Pt has strong faith base.    Continue Psychosocial Services   Follow up required by staff       Psychosocial Re-Evaluation:   Psychosocial Discharge (Final Psychosocial Re-Evaluation):   Education: Education Goals: Education classes will be provided on a weekly basis, covering required topics. Participant will state understanding/return demonstration of topics presented.  Learning Barriers/Preferences: Learning Barriers/Preferences - 06/28/17 1102      Learning Barriers/Preferences    Learning Barriers  Sight;Hearing    Learning Preferences  Individual Instruction;Verbal Instruction       Education Topics: Risk Factor Reduction:  -Group instruction that is supported by a PowerPoint presentation. Instructor discusses the definition of a risk factor, different risk factors for pulmonary disease, and how the heart and lungs work together.     Nutrition for Pulmonary Patient:  -Group instruction provided by PowerPoint slides, verbal discussion, and written materials to support subject matter. The instructor gives an explanation and review of healthy diet recommendations, which includes a discussion on weight management, recommendations for fruit and vegetable consumption, as well as protein, fluid, caffeine, fiber, sodium, sugar, and alcohol. Tips for eating when patients are short of breath are discussed.   Pursed Lip Breathing:  -Group instruction that is supported by demonstration and informational handouts. Instructor discusses the benefits of pursed lip and diaphragmatic breathing and detailed demonstration on how to preform both.     Oxygen Safety:  -Group instruction provided by PowerPoint, verbal discussion, and written material to support subject matter. There is an overview of "What is Oxygen" and "Why do we need it".  Instructor also reviews how to create a safe environment for oxygen use, the importance of using oxygen as prescribed, and the risks of noncompliance. There is a brief discussion on traveling with oxygen and resources the patient may utilize.   Oxygen Equipment:  -Group instruction provided by [facility] Staff utilizing handouts, written materials, and equipment demonstrations.   Signs and Symptoms:  -Group instruction provided by written material and verbal discussion to support subject matter. Warning signs and symptoms of infection, stroke, and heart attack are reviewed and when to call the physician/911 reinforced. Tips for preventing the  spread of infection discussed.   Advanced Directives:  -Group instruction provided by verbal instruction and written material to support subject matter. Instructor reviews Advanced Directive laws and proper instruction for filling out document.   Pulmonary Video:  -Group video education that reviews the importance of medication and oxygen compliance, exercise, good nutrition, pulmonary hygiene, and pursed lip and diaphragmatic breathing for the pulmonary patient.   Exercise for the Pulmonary Patient:  -Group instruction that is supported by a PowerPoint presentation. Instructor discusses benefits of exercise, core components of exercise, frequency, duration, and intensity of an exercise routine, importance of utilizing pulse oximetry during exercise, safety while exercising, and options of places to exercise outside of rehab.     Pulmonary Medications:  -Verbally interactive group education provided by instructor with focus on inhaled medications and proper administration.   Anatomy and Physiology of the Respiratory System and Intimacy:  -Group instruction provided by PowerPoint, verbal discussion, and written material to support subject matter. Instructor reviews respiratory cycle and anatomical components of the respiratory system and their functions. Instructor also reviews differences in obstructive and restrictive respiratory diseases with examples of each. Intimacy, Sex, and Sexuality differences are reviewed with a discussion on how relationships can change when diagnosed with pulmonary disease. Common sexual concerns are reviewed.   MD DAY -  A group question and answer session with a medical doctor that allows participants to ask questions that relate to their pulmonary disease state.   OTHER EDUCATION -Group or individual verbal, written, or video instructions that support the educational goals of the pulmonary rehab program.   Holiday Eating Survival Tips:  -Group instruction  provided by PowerPoint slides, verbal discussion, and written materials to support subject matter. The instructor gives patients tips, tricks, and techniques to help them not only survive but enjoy the holidays despite the onslaught of food that accompanies the holidays.   Knowledge Questionnaire Score:   Core Components/Risk Factors/Patient Goals at Admission: Personal Goals and Risk Factors at Admission - 06/28/17 1217      Core Components/Risk Factors/Patient Goals on Admission    Weight Management  Weight Loss;Obesity       Core Components/Risk Factors/Patient Goals Review:    Core Components/Risk Factors/Patient Goals at Discharge (Final Review):    ITP Comments: ITP Comments    Row Name 06/28/17 1014           ITP Comments  Dr. Jennet Maduro, Medical Director          Comments:

## 2017-07-08 ENCOUNTER — Encounter (HOSPITAL_COMMUNITY): Payer: Self-pay

## 2017-07-08 NOTE — Progress Notes (Signed)
Pulmonary Individual Treatment Plan  Patient Details  Name: Breanna Dixon MRN: [id number] Date of Birth: 10/20/1945 Referring Provider:     Pulmonary Rehab Walk Test from 07/02/2017 in [facility]  Referring Provider  Dr. Annamaria Boots      Initial Encounter Date:    Pulmonary Rehab Walk Test from 07/02/2017 in [facility]  Date  07/02/17  Referring Provider  Dr. Annamaria Boots      Visit Diagnosis: Stage 3 severe COPD by GOLD classification ([place])  Patient's Home Medications on Admission:   Current Outpatient Medications:  .  albuterol (PROVENTIL) (2.5 MG/3ML) 0.083% nebulizer solution, Take 3 mLs (2.5 mg total) by nebulization every 6 (six) hours as needed for wheezing or shortness of breath., Disp: 15 mL, Rfl: 1 .  Brexpiprazole (REXULTI) 2 MG TABS, Take 2.5 mg by mouth daily. , Disp: , Rfl:  .  carbamazepine (TEGRETOL XR) 100 MG 12 hr tablet, Take 100 mg by mouth 3 (three) times daily. , Disp: , Rfl:  .  clonazePAM (KLONOPIN) 1 MG tablet, Take 1 tablet by mouth at bedtime. , Disp: , Rfl:  .  DULoxetine (CYMBALTA) 60 MG capsule, Take 120 mg by mouth at bedtime. , Disp: , Rfl:  .  fenofibrate (TRICOR) 145 MG tablet, Take 1 tablet by mouth daily., Disp: , Rfl:  .  Fluticasone-Umeclidin-Vilant (TRELEGY ELLIPTA) 100-62.5-25 MCG/INH AEPB, Inhale 1 puff into the lungs daily. Rise mouth after each use., Disp: 1 each, Rfl: 4 .  furosemide (LASIX) 20 MG tablet, Take 1 tablet (20 mg total) by mouth daily., Disp: 30 tablet, Rfl: 2 .  hydroxychloroquine (PLAQUENIL) 200 MG tablet, Take 1 tablet (200 mg total) by mouth 2 (two) times daily., Disp: 60 tablet, Rfl: 2 .  Ipratropium-Albuterol (COMBIVENT RESPIMAT) 20-100 MCG/ACT AERS respimat, INHALE TWO PUFFS INTO LUNGS  EVERY 6 HOURS AS NEEDED FOR SHORTNESS OF BREATH, Disp: 1 Inhaler, Rfl: 5 .  metoprolol succinate (TOPROL-XL) 100 MG 24 hr tablet, Take 1 tablet (100 mg total) by mouth daily. Take with or  immediately following a meal., Disp: 90 tablet, Rfl: 1 .  MYRBETRIQ 50 MG TB24 tablet, , Disp: , Rfl: 11 .  Nebulizers (COMPRESSOR/NEBULIZER) MISC, Dx COPD, use as needed, Disp: 1 each, Rfl: 0 .  pregabalin (LYRICA) 100 MG capsule, Take 1-2 capsules (100-200 mg total) by mouth 2 (two) times daily. 1 tab in the am and 2 tabs at night. (Patient taking differently: Take 200 mg by mouth 2 (two) times daily. ), Disp: 90 capsule, Rfl: 1 .  QUEtiapine (SEROQUEL) 50 MG tablet, Take 50 mg by mouth at bedtime., Disp: , Rfl:  .  ranitidine (ZANTAC) 150 MG capsule, Take 1 capsule (150 mg total) by mouth 2 (two) times daily., Disp: 90 capsule, Rfl: 1 .  verapamil (COVERA HS) 240 MG (CO) 24 hr tablet, Take 240 mg by mouth at bedtime., Disp: , Rfl:   Past Medical History: Past Medical History:  Diagnosis Date  . Anxiety    takes Ativan daily, Panic attack   . Arthritis   . Bleeding ulcer 1974  . Breast cancer ([place])    right breast 2000  . [facility])    Surgery only, Lung Cancer- surgery only  . COPD (chronic obstructive pulmonary disease) ([place])   . Depression   . Fibromyalgia    takes Lyrica daily  . Head injury with loss of consciousness ([place])   . Headache(784.0)    takes Tegretol and  Verapamil nightly  . History of blood transfusion    no abnormal reaction noted  . History of bronchitis   . History of colon polyps    benign  . History of kidney stones    several  . History of ulcer disease    pyloric   . Hyperlipidemia    takes Fenofibrate daily  . Hypertension    takes Toprol daily  . Hypertension   . Joint pain   . Lung mass April 2013   Right lower lobe lung mass  . Lupus ([place])    takes Cymbalta,Plaquenil  daily  . Pneumonia    hx of  . PTSD (post-traumatic stress disorder)    takes Cymbalta daily  . Pulmonary embolism ([place]) 2006   IVC filter  . Seasonal allergies    flonase prn  . Vertigo    takes Antivert prn    Tobacco Use: Social History   Tobacco Use  Smoking  Status Current Every Day Smoker  . Packs/day: 1.00  . Types: Cigarettes  . Last attempt to quit: 09/20/2016  . Years since quitting: 0.8  Smokeless Tobacco Never Used  Tobacco Comment   pt started back smoking 08/08/16    Labs: Recent Review Flowsheet Data    Labs for ITP Cardiac and Pulmonary Rehab Latest Ref Rng & Units 01/06/2012 07/13/2014 07/16/2014   PHART 7.350 - 7.450 7.395 7.398 7.324(L)   PCO2ART 35.0 - 45.0 mmHg 37.6 36.0 44.0   HCO3 20.0 - 24.0 mEq/L 22.5 21.7 22.2   TCO2 0 - 100 mmol/L 19.8 22.8 23.6   ACIDBASEDEF 0.0 - 2.0 mmol/L 1.5 2.3(H) 2.9(H)   O2SAT % 94.1 96.2 95.5      Capillary Blood Glucose: Lab Results  Component Value Date   GLUCAP 187 (H) 09/27/2016   GLUCAP 128 (H) 07/19/2014   GLUCAP 95 07/18/2014   GLUCAP 122 (H) 07/18/2014   GLUCAP 109 (H) 07/18/2014     Pulmonary Assessment Scores: Pulmonary Assessment Scores    Row Name 07/04/17 1011         ADL UCSD   ADL Phase  Entry       mMRC Score   mMRC Score  1        Pulmonary Function Assessment:   Exercise Target Goals: Date: 07/02/17  Exercise Program Goal: Individual exercise prescription set using results from initial 6 min walk test and THRR while considering  patient's activity barriers and safety.    Exercise Prescription Goal: Initial exercise prescription builds to 30-45 minutes a day of aerobic activity, 2-3 days per week.  Home exercise guidelines will be given to patient during program as part of exercise prescription that the participant will acknowledge.  Activity Barriers & Risk Stratification: Activity Barriers & Cardiac Risk Stratification - 06/28/17 1103      Activity Barriers & Cardiac Risk Stratification   Activity Barriers  Left Hip Replacement;Shortness of Breath;Left Knee Replacement;Right Knee Replacement;History of Falls;Balance Concerns;Incisional Pain;Back Problems;Arthritis;Fibromyalgia    Cardiac Risk Stratification  High       6 Minute Walk: 6 Minute  Walk    Row Name 07/04/17 0907         6 Minute Walk   Phase  Initial     Distance  736 feet     Walk Time  6 minutes     # of Rest Breaks  - 20 seconds standing     MPH  1.39     METS  2.07  RPE  14     Perceived Dyspnea   2     Symptoms  Yes (comment)     Comments  used wheelchair after 2nd lap     Resting HR  73 bpm     Resting BP  98/58     Resting Oxygen Saturation   94 %     Exercise Oxygen Saturation  during 6 min walk  88 %     Max Ex. HR  91 bpm     Max Ex. BP  130/62       Interval HR   1 Minute HR  91     2 Minute HR  86     3 Minute HR  76     4 Minute HR  78     5 Minute HR  89     6 Minute HR  89     2 Minute Post HR  77     Interval Heart Rate?  Yes       Interval Oxygen   Interval Oxygen?  Yes     Baseline Oxygen Saturation %  94 %     1 Minute Oxygen Saturation %  92 %     1 Minute Liters of Oxygen  0 L     2 Minute Oxygen Saturation %  92 %     2 Minute Liters of Oxygen  0 L     3 Minute Oxygen Saturation %  93 %     3 Minute Liters of Oxygen  0 L     4 Minute Oxygen Saturation %  93 %     4 Minute Liters of Oxygen  0 L     5 Minute Oxygen Saturation %  88 %     5 Minute Liters of Oxygen  0 L     6 Minute Oxygen Saturation %  88 %     6 Minute Liters of Oxygen  0 L     2 Minute Post Oxygen Saturation %  95 %     2 Minute Post Liters of Oxygen  0 L        Oxygen Initial Assessment: Oxygen Initial Assessment - 07/04/17 0906      Initial 6 min Walk   Oxygen Used  None      Program Oxygen Prescription   Program Oxygen Prescription  None did drop to 88% during 6MWT-will keep a close eye on her o2 sats with exercise       Oxygen Re-Evaluation: Oxygen Re-Evaluation    Row Name 07/09/17 0741             Program Oxygen Prescription   Program Oxygen Prescription  None         Home Oxygen   Home Oxygen Device  None       Sleep Oxygen Prescription  None       Home Exercise Oxygen Prescription  None       Home at Rest Exercise  Oxygen Prescription  None          Oxygen Discharge (Final Oxygen Re-Evaluation): Oxygen Re-Evaluation - 07/09/17 0741      Program Oxygen Prescription   Program Oxygen Prescription  None      Home Oxygen   Home Oxygen Device  None    Sleep Oxygen Prescription  None    Home Exercise Oxygen Prescription  None    Home at Rest Exercise Oxygen Prescription  None  Initial Exercise Prescription: Initial Exercise Prescription - 07/04/17 0900      Date of Initial Exercise RX and Referring Provider   Date  07/02/17    Referring Provider  Dr. Annamaria Boots      NuStep   Level  2    SPM  80    Minutes  17    METs  1.5      Arm Ergometer   Level  1    Watts  10    Minutes  17      Track   Laps  5    Minutes  17      Prescription Details   Frequency (times per week)  2    Duration  Progress to 45 minutes of aerobic exercise without signs/symptoms of physical distress      Intensity   THRR 40-80% of Max Heartrate  60-121    Ratings of Perceived Exertion  11-13    Perceived Dyspnea  0-4      Progression   Progression  Continue progressive overload as per policy without signs/symptoms or physical distress.      Resistance Training   Training Prescription  Yes    Weight  orange bands    Reps  10-15       Perform Capillary Blood Glucose checks as needed.  Exercise Prescription Changes:   Exercise Comments:   Exercise Goals and Review:  Exercise Goals    Row Name 06/28/17 1108             Exercise Goals   Increase Physical Activity  Yes       Intervention  Provide advice, education, support and counseling about physical activity/exercise needs.;Develop an individualized exercise prescription for aerobic and resistive training based on initial evaluation findings, risk stratification, comorbidities and participant's personal goals.       Expected Outcomes  Short Term: Attend rehab on a regular basis to increase amount of physical activity.;Long Term: Add in  home exercise to make exercise part of routine and to increase amount of physical activity.;Long Term: Exercising regularly at least 3-5 days a week.       Increase Strength and Stamina  Yes       Intervention  Provide advice, education, support and counseling about physical activity/exercise needs.;Develop an individualized exercise prescription for aerobic and resistive training based on initial evaluation findings, risk stratification, comorbidities and participant's personal goals.       Expected Outcomes  Short Term: Increase workloads from initial exercise prescription for resistance, speed, and METs.;Short Term: Perform resistance training exercises routinely during rehab and add in resistance training at home;Long Term: Improve cardiorespiratory fitness, muscular endurance and strength as measured by increased METs and functional capacity (6MWT)       Able to understand and use rate of perceived exertion (RPE) scale  Yes       Intervention  Provide education and explanation on how to use RPE scale       Expected Outcomes  Short Term: Able to use RPE daily in rehab to express subjective intensity level;Long Term:  Able to use RPE to guide intensity level when exercising independently       Able to understand and use Dyspnea scale  Yes       Intervention  Provide education and explanation on how to use Dyspnea scale       Expected Outcomes  Short Term: Able to use Dyspnea scale daily in rehab to express subjective sense of shortness of  breath during exertion;Long Term: Able to use Dyspnea scale to guide intensity level when exercising independently       Knowledge and understanding of Target Heart Rate Range (THRR)  Yes       Intervention  Provide education and explanation of THRR including how the numbers were predicted and where they are located for reference       Expected Outcomes  Short Term: Able to state/look up THRR;Long Term: Able to use THRR to govern intensity when exercising  independently;Short Term: Able to use daily as guideline for intensity in rehab       Understanding of Exercise Prescription  Yes       Intervention  Provide education, explanation, and written materials on patient's individual exercise prescription       Expected Outcomes  Short Term: Able to explain program exercise prescription;Long Term: Able to explain home exercise prescription to exercise independently          Exercise Goals Re-Evaluation : Exercise Goals Re-Evaluation    Row Name 07/09/17 0741             Exercise Goal Re-Evaluation   Exercise Goals Review  Increase Strength and Stamina;Able to understand and use Dyspnea scale;Increase Physical Activity;Able to understand and use rate of perceived exertion (RPE) scale;Knowledge and understanding of Target Heart Rate Range (THRR);Understanding of Exercise Prescription       Comments  Patient is anticipated to start her first day of exercise today (07/09/17)       Expected Outcomes  Through exercise at rehab and at home, the patient will decrease shortness of breath with daily activities and feel confident in carrying out an exercise regime at home.           Discharge Exercise Prescription (Final Exercise Prescription Changes):   Nutrition:  Target Goals: Understanding of nutrition guidelines, daily intake of sodium 1500mg , cholesterol 200mg , calories 30% from fat and 7% or less from saturated fats, daily to have 5 or more servings of fruits and vegetables.  Biometrics:    Nutrition Therapy Plan and Nutrition Goals: Nutrition Therapy & Goals - 07/04/17 1359      Nutrition Therapy   Diet  General, healthful      Intervention Plan   Intervention  Prescribe, educate and counsel regarding individualized specific dietary modifications aiming towards targeted core components such as weight, hypertension, lipid management, diabetes, heart failure and other comorbidities.    Expected Outcomes  Short Term Goal: Understand basic  principles of dietary content, such as calories, fat, sodium, cholesterol and nutrients.;Long Term Goal: Adherence to prescribed nutrition plan.       Nutrition Assessments: Nutrition Assessments - 07/04/17 1359      Rate Your Plate Scores   Pre Score  47       Nutrition Goals Re-Evaluation:   Nutrition Goals Discharge (Final Nutrition Goals Re-Evaluation):   Psychosocial: Target Goals: Acknowledge presence or absence of significant depression and/or stress, maximize coping skills, provide positive support system. Participant is able to verbalize types and ability to use techniques and skills needed for reducing stress and depression.  Initial Review & Psychosocial Screening: Initial Psych Review & Screening - 06/28/17 1053      Initial Review   Current issues with  Current Depression;Current Anxiety/Panic;Current Psychotropic Meds;Current Sleep Concerns;Current Stress Concerns    Source of Stress Concerns  Family;Financial;Transportation;Unable to participate in former interests or hobbies;Unable to perform yard/household activities;Chronic Illness;Poor Coping Skills    Comments  Pt with multiple stressors-  strained relationships with son and daughte.      Family Dynamics   Good Support System?  No    Strains  Intra-family strains    Comments  support of son who lives with her      Barriers   Psychosocial barriers to participate in program  The patient should benefit from training in stress management and relaxation.      Screening Interventions   Interventions  Encouraged to exercise;To provide support and resources with identified psychosocial needs    Expected Outcomes  Short Term goal: Utilizing psychosocial counselor, staff and physician to assist with identification of specific Stressors or current issues interfering with healing process. Setting desired goal for each stressor or current issue identified.;Long Term Goal: Stressors or current issues are controlled or  eliminated.       Quality of Life Scores:  Scores of 19 and below usually indicate a poorer quality of life in these areas.  A difference of  2-3 points is a clinically meaningful difference.  A difference of 2-3 points in the total score of the Quality of Life Index has been associated with significant improvement in overall quality of life, self-image, physical symptoms, and general health in studies assessing change in quality of life.   PHQ-9: Recent Review Flowsheet Data    Depression screen [facility] 2/9 06/28/2017 06/06/2017 04/17/2017   Decreased Interest 1 0 0   Down, Depressed, Hopeless 2 0 0   PHQ - 2 Score 3 0 0   Altered sleeping 1 - -   Tired, decreased energy 1 - -   Change in appetite 3 - -   Feeling bad or failure about yourself  0 - -   Trouble concentrating 0 - -   Moving slowly or fidgety/restless 0 - -   Suicidal thoughts 0 - -   PHQ-9 Score 8 - -   Difficult doing work/chores Somewhat difficult - -     Interpretation of Total Score  Total Score Depression Severity:  1-4 = Minimal depression, 5-9 = Mild depression, 10-14 = Moderate depression, 15-19 = Moderately severe depression, 20-27 = Severe depression   Psychosocial Evaluation and Intervention: Psychosocial Evaluation - 07/08/17 1711      Psychosocial Evaluation & Interventions   Interventions  Stress management education;Relaxation education       Psychosocial Re-Evaluation: Psychosocial Re-Evaluation    Row Name 07/08/17 1711             Psychosocial Re-Evaluation   Current issues with  Current Stress Concerns;Current Anxiety/Panic;Current Psychotropic Meds;Current Sleep Concerns;Current Depression       Comments  Pt has multiple complex issues within her family and her present living situation.  pt desires to return to church on a regular and consistent basis.       Interventions  Relaxation education;Stress management education;Encouraged to attend Pulmonary Rehabilitation for the exercise        Comments  Pt with multiple stressors- strained relationships with son and daughte.         Initial Review   Source of Stress Concerns  Family;Financial;Transportation;Unable to participate in former interests or hobbies;Unable to perform yard/household activities;Chronic Illness;Poor Coping Skills          Psychosocial Discharge (Final Psychosocial Re-Evaluation): Psychosocial Re-Evaluation - 07/08/17 1711      Psychosocial Re-Evaluation   Current issues with  Current Stress Concerns;Current Anxiety/Panic;Current Psychotropic Meds;Current Sleep Concerns;Current Depression    Comments  Pt has multiple complex issues within her family and her  present living situation.  pt desires to return to church on a regular and consistent basis.    Interventions  Relaxation education;Stress management education;Encouraged to attend Pulmonary Rehabilitation for the exercise    Comments  Pt with multiple stressors- strained relationships with son and daughte.      Initial Review   Source of Stress Concerns  Family;Financial;Transportation;Unable to participate in former interests or hobbies;Unable to perform yard/household activities;Chronic Illness;Poor Coping Skills       Education: Education Goals: Education classes will be provided on a weekly basis, covering required topics. Participant will state understanding/return demonstration of topics presented.  Learning Barriers/Preferences: Learning Barriers/Preferences - 06/28/17 1102      Learning Barriers/Preferences   Learning Barriers  Sight;Hearing    Learning Preferences  Individual Instruction;Verbal Instruction       Education Topics: Risk Factor Reduction:  -Group instruction that is supported by a PowerPoint presentation. Instructor discusses the definition of a risk factor, different risk factors for pulmonary disease, and how the heart and lungs work together.     Nutrition for Pulmonary Patient:  -Group instruction provided by  PowerPoint slides, verbal discussion, and written materials to support subject matter. The instructor gives an explanation and review of healthy diet recommendations, which includes a discussion on weight management, recommendations for fruit and vegetable consumption, as well as protein, fluid, caffeine, fiber, sodium, sugar, and alcohol. Tips for eating when patients are short of breath are discussed.   Pursed Lip Breathing:  -Group instruction that is supported by demonstration and informational handouts. Instructor discusses the benefits of pursed lip and diaphragmatic breathing and detailed demonstration on how to preform both.     Oxygen Safety:  -Group instruction provided by PowerPoint, verbal discussion, and written material to support subject matter. There is an overview of "What is Oxygen" and "Why do we need it".  Instructor also reviews how to create a safe environment for oxygen use, the importance of using oxygen as prescribed, and the risks of noncompliance. There is a brief discussion on traveling with oxygen and resources the patient may utilize.   Oxygen Equipment:  -Group instruction provided by [facility] Staff utilizing handouts, written materials, and equipment demonstrations.   Signs and Symptoms:  -Group instruction provided by written material and verbal discussion to support subject matter. Warning signs and symptoms of infection, stroke, and heart attack are reviewed and when to call the physician/911 reinforced. Tips for preventing the spread of infection discussed.   Advanced Directives:  -Group instruction provided by verbal instruction and written material to support subject matter. Instructor reviews Advanced Directive laws and proper instruction for filling out document.   Pulmonary Video:  -Group video education that reviews the importance of medication and oxygen compliance, exercise, good nutrition, pulmonary hygiene, and pursed lip and diaphragmatic  breathing for the pulmonary patient.   Exercise for the Pulmonary Patient:  -Group instruction that is supported by a PowerPoint presentation. Instructor discusses benefits of exercise, core components of exercise, frequency, duration, and intensity of an exercise routine, importance of utilizing pulse oximetry during exercise, safety while exercising, and options of places to exercise outside of rehab.     Pulmonary Medications:  -Verbally interactive group education provided by instructor with focus on inhaled medications and proper administration.   Anatomy and Physiology of the Respiratory System and Intimacy:  -Group instruction provided by PowerPoint, verbal discussion, and written material to support subject matter. Instructor reviews respiratory cycle and anatomical components of the respiratory system and their  functions. Instructor also reviews differences in obstructive and restrictive respiratory diseases with examples of each. Intimacy, Sex, and Sexuality differences are reviewed with a discussion on how relationships can change when diagnosed with pulmonary disease. Common sexual concerns are reviewed.   MD DAY -A group question and answer session with a medical doctor that allows participants to ask questions that relate to their pulmonary disease state.   OTHER EDUCATION -Group or individual verbal, written, or video instructions that support the educational goals of the pulmonary rehab program.   Holiday Eating Survival Tips:  -Group instruction provided by PowerPoint slides, verbal discussion, and written materials to support subject matter. The instructor gives patients tips, tricks, and techniques to help them not only survive but enjoy the holidays despite the onslaught of food that accompanies the holidays.   Knowledge Questionnaire Score:   Core Components/Risk Factors/Patient Goals at Admission: Personal Goals and Risk Factors at Admission - 06/28/17 1217       Core Components/Risk Factors/Patient Goals on Admission    Weight Management  Weight Loss;Obesity       Core Components/Risk Factors/Patient Goals Review:  Goals and Risk Factor Review    Row Name 07/08/17 1707             Core Components/Risk Factors/Patient Goals Review   Personal Goals Review  Weight Management/Obesity;Develop more efficient breathing techniques such as purse lipped breathing and diaphragmatic breathing and practicing self-pacing with activity.;Stress;Tobacco Cessation;Increase knowledge of respiratory medications and ability to use respiratory devices properly.;Hypertension;Lipids;Improve shortness of breath with ADL's       Review  pt will begin full exercise on 6/4.  Unable to assess goals at this time.  Will montior pt progress toward achieving these goals in the next 30 days.       Expected Outcomes  see "admission outcomes"          Core Components/Risk Factors/Patient Goals at Discharge (Final Review):  Goals and Risk Factor Review - 07/08/17 1707      Core Components/Risk Factors/Patient Goals Review   Personal Goals Review  Weight Management/Obesity;Develop more efficient breathing techniques such as purse lipped breathing and diaphragmatic breathing and practicing self-pacing with activity.;Stress;Tobacco Cessation;Increase knowledge of respiratory medications and ability to use respiratory devices properly.;Hypertension;Lipids;Improve shortness of breath with ADL's    Review  pt will begin full exercise on 6/4.  Unable to assess goals at this time.  Will montior pt progress toward achieving these goals in the next 30 days.    Expected Outcomes  see "admission outcomes"       ITP Comments: ITP Comments    Row Name 06/28/17 1014 07/10/17 1054         ITP Comments  Dr. Jennet Maduro, Medical Director  Dr. Jennet Maduro, Medical Director         Comments: pt has completed 1 exercise session. Cherre Huger, BSN Cardiac and TEFL teacher

## 2017-07-08 NOTE — Progress Notes (Signed)
Breanna Dixon 72 y.o. female  30 day  Psychosocial Note  Patient psychosocial assessment reveals no barriers to participation in Pulmonary Rehab. Psychosocial areas that are currently affecting patient's rehab experience include concerns about housing lives with son who is disabled and roommate who is disabled, limited financial resources,transportation only one car in the household for everyone to use  family problems pt has daughter and son that she is estranged with,family health issues everyone in the home smokes and has either a physical or mental disability  and emotional problems as evidenced by depression, sadness, worry, *loss of interest in usual activities however pt is looking forward to connecting with the church.  Pt used to be a Hospital doctor. Patient does continue to exhibit positive coping skills to deal with her psychosocial concerns. Offered emotional support and reassurance. Patient does feel once she starts full exercise she will  Make progress toward Pulmonary Rehab goals. Patient reports her health and activity level will improved in the next 30 days as evidenced by patient's report of her ability to have set a quit date as her birthday present to herself. Patient states family/friends have noticed changes in her  mood. Patient reports  feeling positive and encouraged about current and projected progression in Pulmonary Rehab. After reviewing the patient's treatment plan, the patient will make making progress toward Pulmonary Rehab goals. Patient's rate of progress toward rehab goals is good. Plan of action to help patient continue to work towards rehab goals include less fatigue, shortness of breath and increased stamina. Will continue to monitor and evaluate progress toward psychosocial goal(s).  Goal(s) in progress: Improved management of stress,depression,anxiety Improved coping skills Help patient work toward returning to meaningful activities that improve patient's QOL and  are attainable with patient's lung disease Carlette Armed forces operational officer, BSN Cardiac and Training and development officer

## 2017-07-09 ENCOUNTER — Encounter (HOSPITAL_COMMUNITY)
Admission: RE | Admit: 2017-07-09 | Discharge: 2017-07-09 | Disposition: A | Discharge status: Home / Self Care | Payer: Medicare Other | Source: Ambulatory Visit | Attending: Internal Medicine | Admitting: Internal Medicine

## 2017-07-09 VITALS — Wt 212.7 lb

## 2017-07-09 DIAGNOSIS — J449 Chronic obstructive pulmonary disease, unspecified: Secondary | ICD-10-CM | POA: Insufficient documentation

## 2017-07-09 NOTE — Progress Notes (Signed)
Daily Session Note  Patient Details  Name: Breanna Dixon MRN: [id number] Date of Birth: 06/20/1945 Referring Provider:     Pulmonary Rehab Walk Test from 07/02/2017 in [facility]  Referring Provider  Dr. Young      Encounter Date: 07/09/2017  Check In: Session Check In - 07/09/17 1222      Check-In   Location  [facility]    Staff Present  Carlette Carlton, RN, BSN; , MS, ACSM RCEP, Exercise Physiologist;Annedrea Stackhouse, RN, MHA;Lisa Hughes, RN    Supervising physician immediately available to respond to emergencies  [facility] immediately available    Physician(s)   Dr. Schertz    Medication changes reported      No    Fall or balance concerns reported     No    Warm-up and Cool-down  Performed as group-led instruction    Resistance Training Performed  Yes    VAD Patient?  No      Pain Assessment   Currently in Pain?  No/denies    Multiple Pain Sites  No       Capillary Blood Glucose: No results found for this or any previous visit (from the past 24 hour(s)).    Social History   Tobacco Use  Smoking Status Current Every Day Smoker  . Packs/day: 1.00  . Types: Cigarettes  . Last attempt to quit: 09/20/2016  . Years since quitting: 0.8  Smokeless Tobacco Never Used  Tobacco Comment   pt started back smoking 08/08/16    Goals Met:  Personal goals reviewed No report of cardiac concerns or symptoms Strength training completed today  Goals Unmet:  Not Applicable  Comments: Service time is from 10:30a to 12:10p    Dr. Wesam G. Yacoub is Medical Director for Pulmonary Rehab at  Hospital. 

## 2017-07-11 ENCOUNTER — Encounter (HOSPITAL_COMMUNITY)
Admission: RE | Admit: 2017-07-11 | Discharge: 2017-07-11 | Disposition: A | Discharge status: Home / Self Care | Payer: Medicare Other | Source: Ambulatory Visit | Attending: Internal Medicine | Admitting: Internal Medicine

## 2017-07-11 ENCOUNTER — Encounter (HOSPITAL_COMMUNITY): Payer: Self-pay | Admitting: *Deleted

## 2017-07-11 DIAGNOSIS — J449 Chronic obstructive pulmonary disease, unspecified: Secondary | ICD-10-CM | POA: Diagnosis not present

## 2017-07-11 NOTE — Progress Notes (Signed)
Daily Session Note  Patient Details  Name: Breanna Dixon MRN: [id number] Date of Birth: 04/03/1945 Referring Provider:     Pulmonary Rehab Walk Test from 07/02/2017 in [facility]  Referring Provider  Dr. Young      Encounter Date: 07/11/2017  Check In: Session Check In - 07/11/17 1121      Check-In   Location  [facility]    Staff Present  Lisa Hughes, RN;Carlette Carlton, RN, BSN; , MS, ACSM RCEP, Exercise Physiologist;Annedrea Stackhouse, RN, MHA    Supervising physician immediately available to respond to emergencies  [facility] immediately available    Physician(s)  Dr. Rai    Medication changes reported      No    Fall or balance concerns reported     No    Tobacco Cessation  No Change    Warm-up and Cool-down  Performed as group-led instruction    Resistance Training Performed  Yes    VAD Patient?  No      Pain Assessment   Currently in Pain?  No/denies    Multiple Pain Sites  No       Capillary Blood Glucose: No results found for this or any previous visit (from the past 24 hour(s)).    Social History   Tobacco Use  Smoking Status Current Every Day Smoker  . Packs/day: 1.00  . Types: Cigarettes  . Last attempt to quit: 09/20/2016  . Years since quitting: 0.8  Smokeless Tobacco Never Used  Tobacco Comment   pt started back smoking 08/08/16    Goals Met:  Exercise tolerated well No report of cardiac concerns or symptoms Strength training completed today  Goals Unmet:  Not Applicable  Comments: Service time is from 10:30A to 12:30P    Dr. Wesam G. Yacoub is Medical Director for Pulmonary Rehab at [facility]. 

## 2017-07-11 NOTE — Progress Notes (Signed)
Breanna Dixon presents to pulmonary rehab for her bi-weekly exercise session. I have completed her thirty day face to face review and determined that Breanna Dixon is on track for meeting their pulmonary rehab goals. There are no barriers identified that will prevent them from continuing their exercise in pulmonary rehab as prescribed.   Rush Farmer, M.D. [facility] Pulmonary/Critical Care Medicine. Pager: [phone]. After hours pager: [phone].

## 2017-07-16 ENCOUNTER — Encounter (HOSPITAL_COMMUNITY)
Admission: RE | Admit: 2017-07-16 | Discharge: 2017-07-16 | Disposition: A | Discharge status: Home / Self Care | Payer: Medicare Other | Source: Ambulatory Visit | Attending: Internal Medicine | Admitting: Internal Medicine

## 2017-07-16 VITALS — Wt 212.7 lb

## 2017-07-16 DIAGNOSIS — J449 Chronic obstructive pulmonary disease, unspecified: Secondary | ICD-10-CM | POA: Diagnosis not present

## 2017-07-16 NOTE — Progress Notes (Signed)
Daily Session Note  Patient Details  Name: Breanna Dixon MRN: [id number] Date of Birth: [birth year] Referring Provider:     Pulmonary Rehab Walk Test from 07/02/2017 in [place]  Referring Provider  Dr. Annamaria Boots      Encounter Date: 07/16/2017  Check In: Session Check In - 07/16/17 1030      Check-In   Location  [facility]    Staff Present  Rosebud Poles, RN, BSN;Molly DiVincenzo, MS, ACSM RCEP, Exercise Physiologist;Lisa Ysidro Evert, RN;Carlette Carlton, RN, Deland Pretty, MS, ACSM CEP, Exercise Physiologist;Annedrea Rosezella [place], RN, [facility]    Supervising physician immediately available to respond to emergencies  [facility] immediately available    Physician(s)  Dr. Tana Coast    Medication changes reported      No    Fall or balance concerns reported     No    Tobacco Cessation  No Change    Warm-up and Cool-down  Performed as group-led instruction    Resistance Training Performed  Yes    VAD Patient?  No      Pain Assessment   Currently in Pain?  No/denies    Multiple Pain Sites  No       Capillary Blood Glucose: No results found for this or any previous visit (from the past 24 hour(s)).  Exercise Prescription Changes - 07/16/17 1300      Response to Exercise   Blood Pressure (Admit)  110/56    Blood Pressure (Exercise)  110/60    Blood Pressure (Exit)  100/60    Heart Rate (Admit)  74 bpm    Heart Rate (Exercise)  70 bpm    Heart Rate (Exit)  63 bpm    Oxygen Saturation (Admit)  97 %    Oxygen Saturation (Exercise)  95 %    Oxygen Saturation (Exit)  96 %    Rating of Perceived Exertion (Exercise)  14    Perceived Dyspnea (Exercise)  2    Duration  Progress to 45 minutes of aerobic exercise without signs/symptoms of physical distress    Intensity  -- 40-80% HRR      Resistance Training   Training Prescription  Yes    Weight  orange bands    Reps  10-15    Time  10 Minutes      Interval Training   Interval  Training  No      NuStep   Level  2    SPM  80    Minutes  17    METs  1.8      Arm Ergometer   Level  -- consult with dietician      Track   Laps  5    Minutes  17       Social History   Tobacco Use  Smoking Status Current Every Day Smoker  . Packs/day: 1.00  . Types: Cigarettes  . Last attempt to quit: 09/20/2016  . Years since quitting: 0.8  Smokeless Tobacco Never Used  Tobacco Comment   pt started back smoking 08/08/16    Goals Met:  Exercise tolerated well Strength training completed today  Goals Unmet:  Not Applicable  Comments: Service time is from 1030 to 1225.    Dr. Rush Farmer is Medical Director for Pulmonary Rehab at [facility].

## 2017-07-16 NOTE — Progress Notes (Signed)
Teriah A Martinique 72 y.o. female  DOB: [birth year] MRN: [id number]           Nutrition Note 1. Stage 3 severe COPD by GOLD classification ([place])    Note Spoke with pt. There are some ways the pt can make her eating habits healthier. Pt's Rate Your Plate results reviewed with pt. Pt does not avoid salty food; eats out (fast food) often. The role of sodium in lung disease reviewed with pt. Pt expressed understanding of the information reviewed via feedback method.    Nutrition Diagnosis ? Food-and nutrition-related knowledge deficit related to lack of exposure to information as related to diagnosis of pulmonary disease ? Obesity related to excessive energy intake as evidenced by a BMI of 34.78  Nutrition Intervention ? Pt's individual nutrition plan and goals reviewed with pt. ? Benefits of adopting healthy eating habits discussed when pt's Rate Your Plate reviewed. ? Handouts given for: Nutrition II Lifestyle Skills  Goal(s) 1. Pt to make healthier food choices when eating/taking out from restaurants 2. Pt to choose more whole grains by buying whole grain bread and freezing it.  Plan:  Pt to attend Pulmonary Nutrition class Will provide client-centered nutrition education as part of interdisciplinary care.   Monitor and evaluate progress toward nutrition goal with team.  Monitor and Evaluate progress toward nutrition goal with team.   Derek Mound, M.Ed, RD, LDN, CDE 07/16/2017 12:09 PM

## 2017-07-18 ENCOUNTER — Encounter (HOSPITAL_COMMUNITY): Payer: Medicare Other

## 2017-07-23 ENCOUNTER — Encounter (HOSPITAL_COMMUNITY)
Admission: RE | Admit: 2017-07-23 | Discharge: 2017-07-23 | Disposition: A | Discharge status: Home / Self Care | Payer: Medicare Other | Source: Ambulatory Visit | Attending: Internal Medicine | Admitting: Internal Medicine

## 2017-07-23 VITALS — Wt 219.1 lb

## 2017-07-23 DIAGNOSIS — J449 Chronic obstructive pulmonary disease, unspecified: Secondary | ICD-10-CM | POA: Diagnosis not present

## 2017-07-23 NOTE — Progress Notes (Signed)
Daily Session Note  Patient Details  Name: Aberdeen A Martinique MRN: [id number] Date of Birth: 10/14/1945 Referring Provider:     Pulmonary Rehab Walk Test from 07/02/2017 in [place]  Referring Provider  Dr. Annamaria Boots      Encounter Date: 07/23/2017  Check In: Session Check In - 07/23/17 1030      Check-In   Location  [facility]    Staff Present  Rosebud Poles, RN, BSN;Molly DiVincenzo, MS, ACSM RCEP, Exercise Physiologist;Lisa Ysidro Evert, RN;Carlette Wilber Oliphant, RN, BSN;Ramon Dredge, RN, [facility]    Supervising physician immediately available to respond to emergencies  [facility] immediately available    Physician(s)  Dr. Broadus John    Medication changes reported      No    Fall or balance concerns reported     No    Tobacco Cessation  No Change    Warm-up and Cool-down  Performed as group-led instruction    Resistance Training Performed  Yes    VAD Patient?  No      Pain Assessment   Currently in Pain?  No/denies    Multiple Pain Sites  No       Capillary Blood Glucose: No results found for this or any previous visit (from the past 24 hour(s)).    Social History   Tobacco Use  Smoking Status Current Every Day Smoker  . Packs/day: 1.00  . Types: Cigarettes  . Last attempt to quit: 09/20/2016  . Years since quitting: 0.8  Smokeless Tobacco Never Used  Tobacco Comment   pt started back smoking 08/08/16    Goals Met:  Exercise tolerated well Strength training completed today  Goals Unmet:  Not Applicable  Comments: Service time is from 1030 to 1210.    Dr. Rush Farmer is Medical Director for Pulmonary Rehab at [facility].

## 2017-07-24 ENCOUNTER — Other Ambulatory Visit (HOSPITAL_COMMUNITY): Payer: Self-pay | Admitting: Orthopedic Surgery

## 2017-07-24 DIAGNOSIS — Z96642 Presence of left artificial hip joint: Secondary | ICD-10-CM

## 2017-07-25 ENCOUNTER — Encounter (HOSPITAL_COMMUNITY): Payer: Medicare Other

## 2017-07-30 ENCOUNTER — Encounter (HOSPITAL_COMMUNITY)
Admission: RE | Admit: 2017-07-30 | Discharge: 2017-07-30 | Disposition: A | Discharge status: Home / Self Care | Payer: Medicare Other | Source: Ambulatory Visit | Attending: Internal Medicine | Admitting: Internal Medicine

## 2017-07-30 VITALS — Wt 217.6 lb

## 2017-07-30 DIAGNOSIS — J449 Chronic obstructive pulmonary disease, unspecified: Secondary | ICD-10-CM | POA: Diagnosis not present

## 2017-07-30 NOTE — Progress Notes (Signed)
Daily Session Note  Patient Details  Name: Breanna Dixon MRN: [id number] Date of Birth: 10-13-1945 Referring Provider:     Pulmonary Rehab Walk Test from 07/02/2017 in [place]  Referring Provider  Dr. Annamaria Boots      Encounter Date: 07/30/2017  Check In: Session Check In - 07/30/17 1030      Check-In   Location  [facility]    Staff Present  Rosebud Poles, RN, BSN;Molly DiVincenzo, MS, ACSM RCEP, Exercise Physiologist;Lisa Ysidro Evert, Felipe Drone, RN, [facility]    Supervising physician immediately available to respond to emergencies  [facility] immediately available    Physician(s)  Dr. Herbert Moors    Medication changes reported      No    Fall or balance concerns reported     No    Tobacco Cessation  No Change    Warm-up and Cool-down  Performed as group-led instruction    Resistance Training Performed  Yes    VAD Patient?  No    PAD/SET Patient?  No      Pain Assessment   Currently in Pain?  No/denies    Pain Score  0-No pain    Multiple Pain Sites  No       Capillary Blood Glucose: No results found for this or any previous visit (from the past 24 hour(s)).  Exercise Prescription Changes - 07/30/17 1200      Response to Exercise   Blood Pressure (Admit)  126/58    Blood Pressure (Exercise)  132/60    Blood Pressure (Exit)  112/58    Heart Rate (Admit)  73 bpm    Heart Rate (Exercise)  92 bpm    Heart Rate (Exit)  60 bpm    Oxygen Saturation (Admit)  95 %    Oxygen Saturation (Exercise)  94 %    Oxygen Saturation (Exit)  97 %    Rating of Perceived Exertion (Exercise)  10    Perceived Dyspnea (Exercise)  0    Duration  Progress to 45 minutes of aerobic exercise without signs/symptoms of physical distress    Intensity  THRR unchanged      Progression   Progression  Continue to progress workloads to maintain intensity without signs/symptoms of physical distress.      Resistance Training   Training Prescription   Yes    Weight  orange bands    Reps  10-15    Time  10 Minutes      Interval Training   Interval Training  No      NuStep   Level  2    SPM  80    Minutes  51    METs  1.9       Social History   Tobacco Use  Smoking Status Current Every Day Smoker  . Packs/day: 1.00  . Types: Cigarettes  . Last attempt to quit: 09/20/2016  . Years since quitting: 0.8  Smokeless Tobacco Never Used  Tobacco Comment   pt started back smoking 08/08/16    Goals Met:  Exercise tolerated well Strength training completed today  Goals Unmet:  Not Applicable  Comments: Service time is from 1030 to 1220    Dr. Rush Farmer is Medical Director for Pulmonary Rehab at [facility].

## 2017-08-01 ENCOUNTER — Encounter (HOSPITAL_COMMUNITY)
Admission: RE | Admit: 2017-08-01 | Discharge: 2017-08-01 | Disposition: A | Discharge status: Home / Self Care | Payer: Medicare Other | Source: Ambulatory Visit | Attending: Internal Medicine | Admitting: Internal Medicine

## 2017-08-01 VITALS — Wt 216.9 lb

## 2017-08-01 DIAGNOSIS — J449 Chronic obstructive pulmonary disease, unspecified: Secondary | ICD-10-CM

## 2017-08-01 NOTE — Progress Notes (Signed)
Daily Session Note  Patient Details  Name: Jasmia A Martinique MRN: [id number] Date of Birth: 1945/09/30 Referring Provider:     Pulmonary Rehab Walk Test from 07/02/2017 in [place]  Referring Provider  Dr. Annamaria Boots      Encounter Date: 08/01/2017  Check In: Session Check In - 08/01/17 1115      Check-In   Location  [facility]    Staff Present  Rosebud Poles, RN, BSN;Molly DiVincenzo, MS, ACSM RCEP, Exercise Physiologist;Lisa Ysidro Evert, Felipe Drone, RN, [facility]    Supervising physician immediately available to respond to emergencies  [facility] immediately available    Physician(s)  Dr. Bonner Puna    Medication changes reported      No    Fall or balance concerns reported     No    Tobacco Cessation  No Change    Warm-up and Cool-down  Performed as group-led instruction    Resistance Training Performed  Yes    VAD Patient?  No    PAD/SET Patient?  No      Pain Assessment   Currently in Pain?  No/denies    Multiple Pain Sites  No       Capillary Blood Glucose: No results found for this or any previous visit (from the past 24 hour(s)).    Social History   Tobacco Use  Smoking Status Current Every Day Smoker  . Packs/day: 1.00  . Types: Cigarettes  . Last attempt to quit: 09/20/2016  . Years since quitting: 0.8  Smokeless Tobacco Never Used  Tobacco Comment   pt started back smoking 08/08/16    Goals Met:  Exercise tolerated well Strength training completed today  Goals Unmet:  Not Applicable  Comments: Service time is from 1115 to 1330    Dr. Rush Farmer is Medical Director for Pulmonary Rehab at [facility].

## 2017-08-01 NOTE — Progress Notes (Signed)
Breanna Dixon presents to pulmonary rehab for /her bi-weekly exercise session. I have completed his/her thirty day face to face review and determined that Breanna Dixon is on track for meeting their pulmonary rehab goals. There are no barriers identified that will prevent them from continuing their exercise in pulmonary rehab as prescribed.   Rush Farmer, M.D. [facility] Pulmonary/Critical Care Medicine. Pager: [phone]. After hours pager: [phone].

## 2017-08-06 ENCOUNTER — Encounter (HOSPITAL_COMMUNITY)
Admission: RE | Admit: 2017-08-06 | Discharge: 2017-08-06 | Disposition: A | Discharge status: Home / Self Care | Payer: Medicare Other | Source: Ambulatory Visit | Attending: Internal Medicine | Admitting: Internal Medicine

## 2017-08-06 DIAGNOSIS — J449 Chronic obstructive pulmonary disease, unspecified: Secondary | ICD-10-CM | POA: Insufficient documentation

## 2017-08-06 NOTE — Progress Notes (Signed)
Breanna Dixon 72 y.o. female  48 day Psychosocial Note  Patient psychosocial assessment reveals  barriers to participation in Pulmonary Rehab. The barrier that is preventing her to progress in pulmonary rehab is hip pain which makes it very painful to exercise.  She is being evaluated by an orthopedic doctor and has a bone scan scheduled 08-07-2017 to evaluate for hip prosthesis loosening.  She was absent from class today.  Patient does not  feel she is making progress toward Pulmonary Rehab goals due to the hip pain. Patient reports her health and activity level has not improved in the past 30 days. Patient states family/friends have not noticed changes in her activity or mood. Patient reports not feeling positive about current and projected progression in Pulmonary Rehab again based on her hip pain. After reviewing the patient's treatment plan, the patient is not making progress toward Pulmonary Rehab goals. Patient's rate of progress toward rehab goals is poor. Plan of action to help patient continue to work towards rehab goals include is to wait for evaluation from her orthopedic doctor to see what treatment is required. Will continue to monitor and evaluate progress toward psychosocial goal(s). She possibly might need to drop out of program if her hip issues prevents her from comfortably exercising, after resolution of hip issues she could reenter the program with a new referral.  Goal(s) in progress: Orthopedic evaluation for hip pain Stress management for help with dealing with orthopedic issues Help patient work toward returning to meaningful activities that improve patient's QOL and are attainable with patient's lung disease

## 2017-08-06 NOTE — Progress Notes (Signed)
Pulmonary Individual Treatment Plan  Patient Details  Name: Breanna Dixon MRN: [id number] Date of Birth: 03-Feb-1946 Referring Provider:     Pulmonary Rehab Walk Test from 07/02/2017 in [place]  Referring Provider  Dr. Annamaria Boots      Initial Encounter Date:    Pulmonary Rehab Walk Test from 07/02/2017 in [place]  Date  07/02/17      Visit Diagnosis: Stage 3 severe COPD by GOLD classification ([place])  Patient's Home Medications on Admission:   Current Outpatient Medications:  .  albuterol (PROVENTIL) (2.5 MG/3ML) 0.083% nebulizer solution, Take 3 mLs (2.5 mg total) by nebulization every 6 (six) hours as needed for wheezing or shortness of breath., Disp: 15 mL, Rfl: 1 .  Brexpiprazole (REXULTI) 2 MG TABS, Take 2.5 mg by mouth daily. , Disp: , Rfl:  .  carbamazepine (TEGRETOL XR) 100 MG 12 hr tablet, Take 100 mg by mouth 3 (three) times daily. , Disp: , Rfl:  .  clonazePAM (KLONOPIN) 1 MG tablet, Take 1 tablet by mouth at bedtime. , Disp: , Rfl:  .  DULoxetine (CYMBALTA) 60 MG capsule, Take 120 mg by mouth at bedtime. , Disp: , Rfl:  .  fenofibrate (TRICOR) 145 MG tablet, Take 1 tablet by mouth daily., Disp: , Rfl:  .  Fluticasone-Umeclidin-Vilant (TRELEGY ELLIPTA) 100-62.5-25 MCG/INH AEPB, Inhale 1 puff into the lungs daily. Rise mouth after each use., Disp: 1 each, Rfl: 4 .  furosemide (LASIX) 20 MG tablet, Take 1 tablet (20 mg total) by mouth daily., Disp: 30 tablet, Rfl: 2 .  hydroxychloroquine (PLAQUENIL) 200 MG tablet, Take 1 tablet (200 mg total) by mouth 2 (two) times daily., Disp: 60 tablet, Rfl: 2 .  Ipratropium-Albuterol (COMBIVENT RESPIMAT) 20-100 MCG/ACT AERS respimat, INHALE TWO PUFFS INTO LUNGS  EVERY 6 HOURS AS NEEDED FOR SHORTNESS OF BREATH, Disp: 1 Inhaler, Rfl: 5 .  metoprolol succinate (TOPROL-XL) 100 MG 24 hr tablet, Take 1 tablet (100 mg total) by mouth daily. Take with or immediately following a meal.,  Disp: 90 tablet, Rfl: 1 .  MYRBETRIQ 50 MG TB24 tablet, , Disp: , Rfl: 11 .  Nebulizers (COMPRESSOR/NEBULIZER) MISC, Dx COPD, use as needed, Disp: 1 each, Rfl: 0 .  pregabalin (LYRICA) 100 MG capsule, Take 1-2 capsules (100-200 mg total) by mouth 2 (two) times daily. 1 tab in the am and 2 tabs at night. (Patient taking differently: Take 200 mg by mouth 2 (two) times daily. ), Disp: 90 capsule, Rfl: 1 .  QUEtiapine (SEROQUEL) 50 MG tablet, Take 50 mg by mouth at bedtime., Disp: , Rfl:  .  ranitidine (ZANTAC) 150 MG capsule, Take 1 capsule (150 mg total) by mouth 2 (two) times daily., Disp: 90 capsule, Rfl: 1 .  verapamil (COVERA HS) 240 MG (CO) 24 hr tablet, Take 240 mg by mouth at bedtime., Disp: , Rfl:   Past Medical History: Past Medical History:  Diagnosis Date  . Anxiety    takes Ativan daily, Panic attack   . Arthritis   . Bleeding ulcer 1974  . Breast cancer ([place])    right breast 2000  . Cancer [facility])    Surgery only, Lung Cancer- surgery only  . COPD (chronic obstructive pulmonary disease) ([place])   . Depression   . Fibromyalgia    takes Lyrica daily  . Head injury with loss of consciousness ([place])   . Headache(784.0)    takes Tegretol and Verapamil nightly  . History of  blood transfusion    no abnormal reaction noted  . History of bronchitis   . History of colon polyps    benign  . History of kidney stones    several  . History of ulcer disease    pyloric   . Hyperlipidemia    takes Fenofibrate daily  . Hypertension    takes Toprol daily  . Hypertension   . Joint pain   . Lung mass April 2013   Right lower lobe lung mass  . Lupus ([place])    takes Cymbalta,Plaquenil  daily  . Pneumonia    hx of  . PTSD (post-traumatic stress disorder)    takes Cymbalta daily  . Pulmonary embolism ([place]) 2006   IVC filter  . Seasonal allergies    flonase prn  . Vertigo    takes Antivert prn    Tobacco Use: Social History   Tobacco Use  Smoking Status Current Every Day  Smoker  . Packs/day: 1.00  . Types: Cigarettes  . Last attempt to quit: 09/20/2016  . Years since quitting: 0.8  Smokeless Tobacco Never Used  Tobacco Comment   pt started back smoking 08/08/16    Labs: Recent Review Flowsheet Data    Labs for ITP Cardiac and Pulmonary Rehab Latest Ref Rng & Units 01/06/2012 07/13/2014 07/16/2014   PHART 7.350 - 7.450 7.395 7.398 7.324(L)   PCO2ART 35.0 - 45.0 mmHg 37.6 36.0 44.0   HCO3 20.0 - 24.0 mEq/L 22.5 21.7 22.2   TCO2 0 - 100 mmol/L 19.8 22.8 23.6   ACIDBASEDEF 0.0 - 2.0 mmol/L 1.5 2.3(H) 2.9(H)   O2SAT % 94.1 96.2 95.5      Capillary Blood Glucose: Lab Results  Component Value Date   GLUCAP 187 (H) 09/27/2016   GLUCAP 128 (H) 07/19/2014   GLUCAP 95 07/18/2014   GLUCAP 122 (H) 07/18/2014   GLUCAP 109 (H) 07/18/2014     Pulmonary Assessment Scores: Pulmonary Assessment Scores    Row Name 07/04/17 1011 07/16/17 1010       ADL UCSD   ADL Phase  Entry  Entry    SOB Score total  -  63      CAT Score   CAT Score  -  12      mMRC Score   mMRC Score  1  -       Pulmonary Function Assessment:   Exercise Target Goals:    Exercise Program Goal: Individual exercise prescription set using results from initial 6 min walk test and THRR while considering  patient's activity barriers and safety.   Exercise Prescription Goal: Initial exercise prescription builds to 30-45 minutes a day of aerobic activity, 2-3 days per week.  Home exercise guidelines will be given to patient during program as part of exercise prescription that the participant will acknowledge.  Activity Barriers & Risk Stratification: Activity Barriers & Cardiac Risk Stratification - 06/28/17 1103      Activity Barriers & Cardiac Risk Stratification   Activity Barriers  Left Hip Replacement;Shortness of Breath;Left Knee Replacement;Right Knee Replacement;History of Falls;Balance Concerns;Incisional Pain;Back Problems;Arthritis;Fibromyalgia    Cardiac Risk  Stratification  High       6 Minute Walk: 6 Minute Walk    Row Name 07/04/17 0907         6 Minute Walk   Phase  Initial     Distance  736 feet     Walk Time  6 minutes     # of Rest Breaks  -  20 seconds standing     MPH  1.39     METS  2.07     RPE  14     Perceived Dyspnea   2     Symptoms  Yes (comment)     Comments  used wheelchair after 2nd lap     Resting HR  73 bpm     Resting BP  98/58     Resting Oxygen Saturation   94 %     Exercise Oxygen Saturation  during 6 min walk  88 %     Max Ex. HR  91 bpm     Max Ex. BP  130/62       Interval HR   1 Minute HR  91     2 Minute HR  86     3 Minute HR  76     4 Minute HR  78     5 Minute HR  89     6 Minute HR  89     2 Minute Post HR  77     Interval Heart Rate?  Yes       Interval Oxygen   Interval Oxygen?  Yes     Baseline Oxygen Saturation %  94 %     1 Minute Oxygen Saturation %  92 %     1 Minute Liters of Oxygen  0 L     2 Minute Oxygen Saturation %  92 %     2 Minute Liters of Oxygen  0 L     3 Minute Oxygen Saturation %  93 %     3 Minute Liters of Oxygen  0 L     4 Minute Oxygen Saturation %  93 %     4 Minute Liters of Oxygen  0 L     5 Minute Oxygen Saturation %  88 %     5 Minute Liters of Oxygen  0 L     6 Minute Oxygen Saturation %  88 %     6 Minute Liters of Oxygen  0 L     2 Minute Post Oxygen Saturation %  95 %     2 Minute Post Liters of Oxygen  0 L        Oxygen Initial Assessment: Oxygen Initial Assessment - 07/04/17 0906      Initial 6 min Walk   Oxygen Used  None      Program Oxygen Prescription   Program Oxygen Prescription  None did drop to 88% during 6MWT-will keep a close eye on her o2 sats with exercise       Oxygen Re-Evaluation: Oxygen Re-Evaluation    Row Name 07/09/17 0741 08/02/17 1003           Program Oxygen Prescription   Program Oxygen Prescription  None  None        Home Oxygen   Home Oxygen Device  None  None      Sleep Oxygen Prescription  None   None      Home Exercise Oxygen Prescription  None  None      Home at Rest Exercise Oxygen Prescription  None  None         Oxygen Discharge (Final Oxygen Re-Evaluation): Oxygen Re-Evaluation - 08/02/17 1003      Program Oxygen Prescription   Program Oxygen Prescription  None      Home Oxygen   Home Oxygen Device  None  Sleep Oxygen Prescription  None    Home Exercise Oxygen Prescription  None    Home at Rest Exercise Oxygen Prescription  None       Initial Exercise Prescription: Initial Exercise Prescription - 07/04/17 0900      Date of Initial Exercise RX and Referring Provider   Date  07/02/17    Referring Provider  Dr. Annamaria Boots      NuStep   Level  2    SPM  80    Minutes  17    METs  1.5      Arm Ergometer   Level  1    Watts  10    Minutes  17      Track   Laps  5    Minutes  17      Prescription Details   Frequency (times per week)  2    Duration  Progress to 45 minutes of aerobic exercise without signs/symptoms of physical distress      Intensity   THRR 40-80% of Max Heartrate  60-121    Ratings of Perceived Exertion  11-13    Perceived Dyspnea  0-4      Progression   Progression  Continue progressive overload as per policy without signs/symptoms or physical distress.      Resistance Training   Training Prescription  Yes    Weight  orange bands    Reps  10-15       Perform Capillary Blood Glucose checks as needed.  Exercise Prescription Changes: Exercise Prescription Changes    Row Name 07/16/17 1300 07/30/17 1200           Response to Exercise   Blood Pressure (Admit)  110/56  126/58      Blood Pressure (Exercise)  110/60  132/60      Blood Pressure (Exit)  100/60  112/58      Heart Rate (Admit)  74 bpm  73 bpm      Heart Rate (Exercise)  70 bpm  92 bpm      Heart Rate (Exit)  63 bpm  60 bpm      Oxygen Saturation (Admit)  97 %  95 %      Oxygen Saturation (Exercise)  95 %  94 %      Oxygen Saturation (Exit)  96 %  97 %       Rating of Perceived Exertion (Exercise)  14  10      Perceived Dyspnea (Exercise)  2  0      Duration  Progress to 45 minutes of aerobic exercise without signs/symptoms of physical distress  Progress to 45 minutes of aerobic exercise without signs/symptoms of physical distress      Intensity  - 40-80% HRR  THRR unchanged        Progression   Progression  -  Continue to progress workloads to maintain intensity without signs/symptoms of physical distress.        Resistance Training   Training Prescription  Yes  Yes      Weight  orange bands  orange bands      Reps  10-15  10-15      Time  10 Minutes  10 Minutes        Interval Training   Interval Training  No  No        NuStep   Level  2  2      SPM  80  80  Minutes  17  51      METs  1.8  1.9        Arm Ergometer   Level  - consult with dietician  -        Track   Laps  5  -      Minutes  17  -         Exercise Comments:   Exercise Goals and Review: Exercise Goals    Row Name 06/28/17 1108             Exercise Goals   Increase Physical Activity  Yes       Intervention  Provide advice, education, support and counseling about physical activity/exercise needs.;Develop an individualized exercise prescription for aerobic and resistive training based on initial evaluation findings, risk stratification, comorbidities and participant's personal goals.       Expected Outcomes  Short Term: Attend rehab on a regular basis to increase amount of physical activity.;Long Term: Add in home exercise to make exercise part of routine and to increase amount of physical activity.;Long Term: Exercising regularly at least 3-5 days a week.       Increase Strength and Stamina  Yes       Intervention  Provide advice, education, support and counseling about physical activity/exercise needs.;Develop an individualized exercise prescription for aerobic and resistive training based on initial evaluation findings, risk stratification,  comorbidities and participant's personal goals.       Expected Outcomes  Short Term: Increase workloads from initial exercise prescription for resistance, speed, and METs.;Short Term: Perform resistance training exercises routinely during rehab and add in resistance training at home;Long Term: Improve cardiorespiratory fitness, muscular endurance and strength as measured by increased METs and functional capacity (6MWT)       Able to understand and use rate of perceived exertion (RPE) scale  Yes       Intervention  Provide education and explanation on how to use RPE scale       Expected Outcomes  Short Term: Able to use RPE daily in rehab to express subjective intensity level;Long Term:  Able to use RPE to guide intensity level when exercising independently       Able to understand and use Dyspnea scale  Yes       Intervention  Provide education and explanation on how to use Dyspnea scale       Expected Outcomes  Short Term: Able to use Dyspnea scale daily in rehab to express subjective sense of shortness of breath during exertion;Long Term: Able to use Dyspnea scale to guide intensity level when exercising independently       Knowledge and understanding of Target Heart Rate Range (THRR)  Yes       Intervention  Provide education and explanation of THRR including how the numbers were predicted and where they are located for reference       Expected Outcomes  Short Term: Able to state/look up THRR;Long Term: Able to use THRR to govern intensity when exercising independently;Short Term: Able to use daily as guideline for intensity in rehab       Understanding of Exercise Prescription  Yes       Intervention  Provide education, explanation, and written materials on patient's individual exercise prescription       Expected Outcomes  Short Term: Able to explain program exercise prescription;Long Term: Able to explain home exercise prescription to exercise independently          Exercise Goals  Re-Evaluation : Exercise Goals Re-Evaluation    [place] Name 07/09/17 0741 08/02/17 1003           Exercise Goal Re-Evaluation   Exercise Goals Review  Increase Strength and Stamina;Able to understand and use Dyspnea scale;Increase Physical Activity;Able to understand and use rate of perceived exertion (RPE) scale;Knowledge and understanding of Target Heart Rate Range (THRR);Understanding of Exercise Prescription  Increase Strength and Stamina;Able to understand and use Dyspnea scale;Increase Physical Activity;Able to understand and use rate of perceived exertion (RPE) scale;Knowledge and understanding of Target Heart Rate Range (THRR);Understanding of Exercise Prescription      Comments  Patient is anticipated to start her first day of exercise today (07/09/17)  Patient is struggling with hip pain. This is a major barrier for her. Progression has not been made. Met level places her in a low level. Is not walking on the track due to pain. Only non-weight bearing exercises      Expected Outcomes  Through exercise at rehab and at home, the patient will decrease shortness of breath with daily activities and feel confident in carrying out an exercise regime at home.   Through exercise at rehab and at home, the patient will decrease shortness of breath with daily activities and feel confident in carrying out an exercise regime at home.          Discharge Exercise Prescription (Final Exercise Prescription Changes): Exercise Prescription Changes - 07/30/17 1200      Response to Exercise   Blood Pressure (Admit)  126/58    Blood Pressure (Exercise)  132/60    Blood Pressure (Exit)  112/58    Heart Rate (Admit)  73 bpm    Heart Rate (Exercise)  92 bpm    Heart Rate (Exit)  60 bpm    Oxygen Saturation (Admit)  95 %    Oxygen Saturation (Exercise)  94 %    Oxygen Saturation (Exit)  97 %    Rating of Perceived Exertion (Exercise)  10    Perceived Dyspnea (Exercise)  0    Duration  Progress to 45 minutes  of aerobic exercise without signs/symptoms of physical distress    Intensity  THRR unchanged      Progression   Progression  Continue to progress workloads to maintain intensity without signs/symptoms of physical distress.      Resistance Training   Training Prescription  Yes    Weight  orange bands    Reps  10-15    Time  10 Minutes      Interval Training   Interval Training  No      NuStep   Level  2    SPM  80    Minutes  51    METs  1.9       Nutrition:  Target Goals: Understanding of nutrition guidelines, daily intake of sodium <15105m, cholesterol <2042m calories 30% from fat and 7% or less from saturated fats, daily to have 5 or more servings of fruits and vegetables.  Biometrics:    Nutrition Therapy Plan and Nutrition Goals: Nutrition Therapy & Goals - 07/04/17 1359      Nutrition Therapy   Diet  General, healthful      Intervention Plan   Intervention  Prescribe, educate and counsel regarding individualized specific dietary modifications aiming towards targeted core components such as weight, hypertension, lipid management, diabetes, heart failure and other comorbidities.    Expected Outcomes  Short Term Goal: Understand basic principles of dietary content, such as  calories, fat, sodium, cholesterol and nutrients.;Long Term Goal: Adherence to prescribed nutrition plan.       Nutrition Assessments: Nutrition Assessments - 07/04/17 1359      Rate Your Plate Scores   Pre Score  47       Nutrition Goals Re-Evaluation:   Nutrition Goals Discharge (Final Nutrition Goals Re-Evaluation):   Psychosocial: Target Goals: Acknowledge presence or absence of significant depression and/or stress, maximize coping skills, provide positive support system. Participant is able to verbalize types and ability to use techniques and skills needed for reducing stress and depression.  Initial Review & Psychosocial Screening: Initial Psych Review & Screening - 06/28/17 1053       Initial Review   Current issues with  Current Depression;Current Anxiety/Panic;Current Psychotropic Meds;Current Sleep Concerns;Current Stress Concerns    Source of Stress Concerns  Family;Financial;Transportation;Unable to participate in former interests or hobbies;Unable to perform yard/household activities;Chronic Illness;Poor Coping Skills    Comments  Pt with multiple stressors- strained relationships with son and daughte.      Family Dynamics   Good Support System?  No    Strains  Intra-family strains    Comments  support of son who lives with her      Barriers   Psychosocial barriers to participate in program  The patient should benefit from training in stress management and relaxation.      Screening Interventions   Interventions  Encouraged to exercise;To provide support and resources with identified psychosocial needs    Expected Outcomes  Short Term goal: Utilizing psychosocial counselor, staff and physician to assist with identification of specific Stressors or current issues interfering with healing process. Setting desired goal for each stressor or current issue identified.;Long Term Goal: Stressors or current issues are controlled or eliminated.       Quality of Life Scores:  Scores of 19 and below usually indicate a poorer quality of life in these areas.  A difference of  2-3 points is a clinically meaningful difference.  A difference of 2-3 points in the total score of the Quality of Life Index has been associated with significant improvement in overall quality of life, self-image, physical symptoms, and general health in studies assessing change in quality of life.   PHQ-9: Recent Review Flowsheet Data    Depression screen [facility] 2/9 06/28/2017 06/06/2017 04/17/2017   Decreased Interest 1 0 0   Down, Depressed, Hopeless 2 0 0   PHQ - 2 Score 3 0 0   Altered sleeping 1 - -   Tired, decreased energy 1 - -   Change in appetite 3 - -   Feeling bad or failure about  yourself  0 - -   Trouble concentrating 0 - -   Moving slowly or fidgety/restless 0 - -   Suicidal thoughts 0 - -   PHQ-9 Score 8 - -   Difficult doing work/chores Somewhat difficult - -     Interpretation of Total Score  Total Score Depression Severity:  1-4 = Minimal depression, 5-9 = Mild depression, 10-14 = Moderate depression, 15-19 = Moderately severe depression, 20-27 = Severe depression   Psychosocial Evaluation and Intervention: Psychosocial Evaluation - 07/08/17 1711      Psychosocial Evaluation & Interventions   Interventions  Stress management education;Relaxation education       Psychosocial Re-Evaluation: Psychosocial Re-Evaluation    Row Name 07/08/17 1711 08/05/17 1458           Psychosocial Re-Evaluation   Current issues with  Current  Stress Concerns;Current Anxiety/Panic;Current Psychotropic Meds;Current Sleep Concerns;Current Depression  Current Stress Concerns;Current Anxiety/Panic;Current Psychotropic Meds;Current Sleep Concerns;Current Depression      Comments  Pt has multiple complex issues within her family and her present living situation.  pt desires to return to church on a regular and consistent basis.  Pt has multiple complex issues within her family and her present living situation.  pt desires to return to church on a regular and consistent basis.      Interventions  Relaxation education;Stress management education;Encouraged to attend Pulmonary Rehabilitation for the exercise  Relaxation education;Stress management education;Encouraged to attend Pulmonary Rehabilitation for the exercise      Continue Psychosocial Services   -  - sees a counselor weekly      Comments  Pt with multiple stressors- strained relationships with son and daughte.  Pt with multiple stressors- strained relationships with son and daughte.        Initial Review   Source of Stress Concerns  Family;Financial;Transportation;Unable to participate in former interests or  hobbies;Unable to perform yard/household activities;Chronic Illness;Poor Coping Skills  Family;Financial;Transportation;Unable to participate in former interests or hobbies;Unable to perform yard/household activities;Chronic Illness;Poor Coping Skills         Psychosocial Discharge (Final Psychosocial Re-Evaluation): Psychosocial Re-Evaluation - 08/05/17 1458      Psychosocial Re-Evaluation   Current issues with  Current Stress Concerns;Current Anxiety/Panic;Current Psychotropic Meds;Current Sleep Concerns;Current Depression    Comments  Pt has multiple complex issues within her family and her present living situation.  pt desires to return to church on a regular and consistent basis.    Interventions  Relaxation education;Stress management education;Encouraged to attend Pulmonary Rehabilitation for the exercise    Continue Psychosocial Services   -- sees a counselor weekly    Comments  Pt with multiple stressors- strained relationships with son and daughte.      Initial Review   Source of Stress Concerns  Family;Financial;Transportation;Unable to participate in former interests or hobbies;Unable to perform yard/household activities;Chronic Illness;Poor Coping Skills        Education: Education Goals: Education classes will be provided on a weekly basis, covering required topics. Participant will state understanding/return demonstration of topics presented.  Learning Barriers/Preferences: Learning Barriers/Preferences - 06/28/17 1102      Learning Barriers/Preferences   Learning Barriers  Sight;Hearing    Learning Preferences  Individual Instruction;Verbal Instruction       Education Topics: How Lungs Work and Diseases: - Discuss the anatomy of the lungs and diseases that can affect the lungs, such as COPD.   Exercise: -Discuss the importance of exercise, FITT principles of exercise, normal and abnormal responses to exercise, and how to exercise safely.   Environmental  Irritants: -Discuss types of environmental irritants and how to limit exposure to environmental irritants.   Meds/Inhalers and oxygen: - Discuss respiratory medications, definition of an inhaler and oxygen, and the proper way to use an inhaler and oxygen.   Energy Saving Techniques: - Discuss methods to conserve energy and decrease shortness of breath when performing activities of daily living.    Bronchial Hygiene / Breathing Techniques: - Discuss breathing mechanics, pursed-lip breathing technique,  proper posture, effective ways to clear airways, and other functional breathing techniques   Cleaning Equipment: - Provides group verbal and written instruction about the health risks of elevated stress, cause of high stress, and healthy ways to reduce stress.   Nutrition I: Fats: - Discuss the types of cholesterol, what cholesterol does to the body, and how cholesterol levels  can be controlled.   Nutrition II: Labels: -Discuss the different components of food labels and how to read food labels.   Respiratory Infections: - Discuss the signs and symptoms of respiratory infections, ways to prevent respiratory infections, and the importance of seeking medical treatment when having a respiratory infection.   Stress I: Signs and Symptoms: - Discuss the causes of stress, how stress may lead to anxiety and depression, and ways to limit stress.   Stress II: Relaxation: -Discuss relaxation techniques to limit stress.   Oxygen for Home/Travel: - Discuss how to prepare for travel when on oxygen and proper ways to transport and store oxygen to ensure safety.   Knowledge Questionnaire Score: Knowledge Questionnaire Score - 07/16/17 1010      Knowledge Questionnaire Score   Pre Score  15/18       Core Components/Risk Factors/Patient Goals at Admission: Personal Goals and Risk Factors at Admission - 06/28/17 1217      Core Components/Risk Factors/Patient Goals on Admission     Weight Management  Weight Loss;Obesity       Core Components/Risk Factors/Patient Goals Review:  Goals and Risk Factor Review    Row Name 07/08/17 1707 08/05/17 1457           Core Components/Risk Factors/Patient Goals Review   Personal Goals Review  Weight Management/Obesity;Develop more efficient breathing techniques such as purse lipped breathing and diaphragmatic breathing and practicing self-pacing with activity.;Stress;Tobacco Cessation;Increase knowledge of respiratory medications and ability to use respiratory devices properly.;Hypertension;Lipids;Improve shortness of breath with ADL's  Weight Management/Obesity;Develop more efficient breathing techniques such as purse lipped breathing and diaphragmatic breathing and practicing self-pacing with activity.;Stress;Tobacco Cessation;Increase knowledge of respiratory medications and ability to use respiratory devices properly.;Hypertension;Lipids;Improve shortness of breath with ADL's      Review  pt will begin full exercise on 6/4.  Unable to assess goals at this time.  Will montior pt progress toward achieving these goals in the next 30 days.  having hip pain, scheduled for bone scan 08/07/17 to evaluate for prosthesis loosening, this has slowed her progression to goals due to chronic pain in hip      Expected Outcomes  see "admission outcomes"  see "admission outcomes"         Core Components/Risk Factors/Patient Goals at Discharge (Final Review):  Goals and Risk Factor Review - 08/05/17 1457      Core Components/Risk Factors/Patient Goals Review   Personal Goals Review  Weight Management/Obesity;Develop more efficient breathing techniques such as purse lipped breathing and diaphragmatic breathing and practicing self-pacing with activity.;Stress;Tobacco Cessation;Increase knowledge of respiratory medications and ability to use respiratory devices properly.;Hypertension;Lipids;Improve shortness of breath with ADL's    Review  having hip  pain, scheduled for bone scan 08/07/17 to evaluate for prosthesis loosening, this has slowed her progression to goals due to chronic pain in hip    Expected Outcomes  see "admission outcomes"       ITP Comments: ITP Comments    Row Name 06/28/17 1014 07/10/17 1054         ITP Comments  Dr. Jennet Maduro, Medical Director  Dr. Jennet Maduro, Medical Director         Comments: ITP REVIEW Pt is making expected progress toward pulmonary rehab goals after completing 6 sessions. Recommend continued exercise, life style modification, education, and utilization of breathing techniques to increase stamina and strength and decrease shortness of breath with exertion.

## 2017-08-07 ENCOUNTER — Ambulatory Visit (HOSPITAL_COMMUNITY)
Admission: RE | Admit: 2017-08-07 | Discharge: 2017-08-07 | Disposition: A | Discharge status: Home / Self Care | Payer: Medicare Other | Source: Ambulatory Visit | Attending: Orthopedic Surgery | Admitting: Orthopedic Surgery

## 2017-08-07 ENCOUNTER — Encounter (HOSPITAL_COMMUNITY)
Admission: RE | Admit: 2017-08-07 | Discharge: 2017-08-07 | Disposition: A | Discharge status: Home / Self Care | Payer: Medicare Other | Source: Ambulatory Visit | Attending: Orthopedic Surgery | Admitting: Orthopedic Surgery

## 2017-08-07 DIAGNOSIS — Z96642 Presence of left artificial hip joint: Secondary | ICD-10-CM | POA: Diagnosis present

## 2017-08-07 MED ORDER — TECHNETIUM TC 99M MEDRONATE IV KIT
20.0000 | PACK | Freq: Once | INTRAVENOUS | Status: AC | PRN
  Administered 2017-08-07: 20 via INTRAVENOUS

## 2017-08-13 ENCOUNTER — Telehealth (HOSPITAL_COMMUNITY): Payer: Self-pay | Admitting: *Deleted

## 2017-08-13 ENCOUNTER — Encounter (HOSPITAL_COMMUNITY): Payer: Medicare Other

## 2017-08-14 ENCOUNTER — Ambulatory Visit (INDEPENDENT_AMBULATORY_CARE_PROVIDER_SITE_OTHER): Payer: Medicare Other | Admitting: Family Medicine

## 2017-08-14 ENCOUNTER — Encounter: Payer: Self-pay | Admitting: Family Medicine

## 2017-08-14 VITALS — BP 125/80 | HR 62 | Temp 98.5°F | Ht 67.0 in | Wt 214.0 lb

## 2017-08-14 DIAGNOSIS — IMO0002 Reserved for concepts with insufficient information to code with codable children: Secondary | ICD-10-CM

## 2017-08-14 DIAGNOSIS — M329 Systemic lupus erythematosus, unspecified: Secondary | ICD-10-CM

## 2017-08-14 DIAGNOSIS — I10 Essential (primary) hypertension: Secondary | ICD-10-CM

## 2017-08-14 MED ORDER — VERAPAMIL HCL ER 120 MG PO TBCR: 120.0000 mg | EXTENDED_RELEASE_TABLET | Freq: Every day | ORAL | 2 refills | Status: DC

## 2017-08-14 MED ORDER — HYDROXYCHLOROQUINE SULFATE 200 MG PO TABS: 200.0000 mg | ORAL_TABLET | Freq: Two times a day (BID) | ORAL | 2 refills | Status: DC

## 2017-08-14 MED ORDER — PREDNISONE 20 MG PO TABS: ORAL_TABLET | ORAL | 0 refills | Status: DC

## 2017-08-14 NOTE — Patient Instructions (Signed)
It was great seeing you today! I think your blurry vision and skin changes are likely due to a lupus flare. I think a 7 day course of prednisone 40mg  will be a good treatment to get through the acute phase. I would like to see you back in 2-3weeks to discuss some health maintenance items, as well as check and make sure you are doing ok with the lupus.  I refilled your verapamil, and plaquenil. Please keep your follow up appointment with your orthopedic specialist on 7/11.

## 2017-08-15 ENCOUNTER — Encounter (HOSPITAL_COMMUNITY): Payer: Medicare Other

## 2017-08-16 NOTE — Progress Notes (Signed)
   HPI 72 year old who presents with 2 week history of blurry vision. She states that she was having her usual vision until around 2 weeks ago. She was seen by her optometrist and had no abnormalities on exam around 4 weeks ago. She states that starting around 2 weeks ago she has been under a lot of stress. She didn't really want to go into the various stressors but did say there was a bug infestation in the house. Around this period of time she developed a skin rash on bilateral arms. It was a raised patch on bilateral arms with multiple areas of skin breakdown. She states that it has gotten better since that time.  CC: double vision, rash   ROS:  Review of Systems See HPI for ROS.   CC, SH/smoking status, and VS noted  Objective: BP 125/80 (BP Location: Left Arm, Patient Position: Sitting, Cuff Size: Normal)   Pulse 62   Temp 98.5 F (36.9 C) (Oral)   Ht 5\' 7"  (1.702 m)   Wt 214 lb (97.1 kg)   LMP 06/04/2011   SpO2 98%   BMI 33.52 kg/m  Gen: NAD, alert, cooperative, and pleasant. Well appearing, caucasian female, in no acute distress HEENT: NCAT, EOMI, PERRL CV: RRR, no murmur Resp: CTAB, no wheezes, non-labored Abd: SNTND, BS present, no guarding or organomegaly Ext: No edema, warm Neuro: Alert and oriented, Speech clear, No gross deficits. EOMI, PERRL   Assessment and plan:  Lupus ([place]) Her skin manifestations and vision changes seem to be consistent with lupus flares she has had in the past. It has been a long time since she has had a flare but has been under a tremendous amount of stress recently. It seems like that the stress has triggered a lupus flair. She would like to defer referral back to rheum at the moment. - will try systemic steroids, prednisone 40mg  daily for 7 days - follow up in 1-2 weeks, will evaluate how things have been going  Hypertension Refilled verapamil. BP 125/80 so very well controlled.   No orders of the defined types were placed in this  encounter.   Meds ordered this encounter  Medications  . verapamil (CALAN-SR) 120 MG CR tablet    Sig: Take 1 tablet (120 mg total) by mouth at bedtime.    Dispense:  30 tablet    Refill:  2  . predniSONE (DELTASONE) 20 MG tablet    Sig: Take two tablets (40mg ) daily with breakfast for 7 days    Dispense:  14 tablet    Refill:  0  . hydroxychloroquine (PLAQUENIL) 200 MG tablet    Sig: Take 1 tablet (200 mg total) by mouth 2 (two) times daily.    Dispense:  60 tablet    Refill:  2    Guadalupe Dawn MD PGY-2 Family Medicine Resident  08/17/2017 9:24 AM

## 2017-08-17 DIAGNOSIS — IMO0002 Reserved for concepts with insufficient information to code with codable children: Secondary | ICD-10-CM | POA: Insufficient documentation

## 2017-08-17 DIAGNOSIS — M329 Systemic lupus erythematosus, unspecified: Secondary | ICD-10-CM | POA: Insufficient documentation

## 2017-08-17 NOTE — Assessment & Plan Note (Signed)
Her skin manifestations and vision changes seem to be consistent with lupus flares she has had in the past. It has been a long time since she has had a flare but has been under a tremendous amount of stress recently. It seems like that the stress has triggered a lupus flair. She would like to defer referral back to rheum at the moment. - will try systemic steroids, prednisone 40mg  daily for 7 days - follow up in 1-2 weeks, will evaluate how things have been going

## 2017-08-17 NOTE — Assessment & Plan Note (Signed)
Refilled verapamil. BP 125/80 so very well controlled.

## 2017-08-20 ENCOUNTER — Encounter (HOSPITAL_COMMUNITY)
Admission: RE | Admit: 2017-08-20 | Discharge: 2017-08-20 | Disposition: A | Discharge status: Home / Self Care | Payer: Medicare Other | Source: Ambulatory Visit | Attending: Internal Medicine | Admitting: Internal Medicine

## 2017-08-20 VITALS — Wt 211.9 lb

## 2017-08-20 DIAGNOSIS — J449 Chronic obstructive pulmonary disease, unspecified: Secondary | ICD-10-CM | POA: Diagnosis not present

## 2017-08-20 NOTE — Progress Notes (Signed)
Daily Session Note  Patient Details  Name: Breanna Dixon MRN: [id number] Date of Birth: [birth year] Referring Provider:     Pulmonary Rehab Walk Test from 07/02/2017 in [place]  Referring Provider  Dr. Annamaria Boots      Encounter Date: 08/20/2017  Check In: Session Check In - 08/20/17 1030      Check-In   Location  [facility]    Staff Present  Rosebud Poles, RN, BSN;Carlette Carlton, RN, [facility], MS, ACSM RCEP, Exercise Physiologist;Lisa Ysidro Evert, Felipe Drone, RN, [facility]    Supervising physician immediately available to respond to emergencies  [facility] immediately available    Physician(s)  Dr. Broadus John    Medication changes reported      No    Fall or balance concerns reported     No    Tobacco Cessation  No Change    Warm-up and Cool-down  Performed as group-led instruction    Resistance Training Performed  Yes    VAD Patient?  No    PAD/SET Patient?  No      Pain Assessment   Currently in Pain?  No/denies    Multiple Pain Sites  No       Capillary Blood Glucose: No results found for this or any previous visit (from the past 24 hour(s)).    Social History   Tobacco Use  Smoking Status Current Every Day Smoker  . Packs/day: 1.00  . Types: Cigarettes  . Last attempt to quit: 09/20/2016  . Years since quitting: 0.9  Smokeless Tobacco Never Used  Tobacco Comment   pt started back smoking 08/08/16    Goals Met:  Exercise tolerated well Strength training completed today  Goals Unmet:  Not Applicable  Comments: Service time is from 1030 to 1205.    Dr. Rush Farmer is Medical Director for Pulmonary Rehab at [facility].

## 2017-08-21 ENCOUNTER — Other Ambulatory Visit: Payer: Self-pay | Admitting: Family Medicine

## 2017-08-21 MED ORDER — PREDNISONE 10 MG PO TABS: ORAL_TABLET | ORAL | 0 refills | Status: DC

## 2017-08-21 NOTE — Addendum Note (Signed)
Addended by: Pauletta Browns on: 08/21/2017 08:48 AM   Modules accepted: Orders

## 2017-08-22 ENCOUNTER — Encounter (HOSPITAL_COMMUNITY): Payer: Medicare Other

## 2017-08-26 ENCOUNTER — Other Ambulatory Visit: Payer: Self-pay | Admitting: *Deleted

## 2017-08-26 NOTE — Progress Notes (Signed)
Pulmonary Individual Treatment Plan  Patient Details  Name: Breanna Dixon MRN: [id number] Date of Birth: 08-Sep-1945 Referring Provider:     Pulmonary Rehab Walk Test from 07/02/2017 in [place]  Referring Provider  Dr. Annamaria Boots      Initial Encounter Date:    Pulmonary Rehab Walk Test from 07/02/2017 in [place]  Date  07/02/17      Visit Diagnosis: Stage 3 severe COPD by GOLD classification ([place])  Patient's Home Medications on Admission:   Current Outpatient Medications:  .  albuterol (PROVENTIL) (2.5 MG/3ML) 0.083% nebulizer solution, Take 3 mLs (2.5 mg total) by nebulization every 6 (six) hours as needed for wheezing or shortness of breath., Disp: 15 mL, Rfl: 1 .  Brexpiprazole (REXULTI) 2 MG TABS, Take 2.5 mg by mouth daily. , Disp: , Rfl:  .  carbamazepine (TEGRETOL XR) 100 MG 12 hr tablet, Take 100 mg by mouth 3 (three) times daily. , Disp: , Rfl:  .  clonazePAM (KLONOPIN) 1 MG tablet, Take 1 tablet by mouth at bedtime. , Disp: , Rfl:  .  DULoxetine (CYMBALTA) 60 MG capsule, Take 120 mg by mouth at bedtime. , Disp: , Rfl:  .  fenofibrate (TRICOR) 145 MG tablet, Take 1 tablet by mouth daily., Disp: , Rfl:  .  Fluticasone-Umeclidin-Vilant (TRELEGY ELLIPTA) 100-62.5-25 MCG/INH AEPB, Inhale 1 puff into the lungs daily. Rise mouth after each use., Disp: 1 each, Rfl: 4 .  furosemide (LASIX) 20 MG tablet, Take 1 tablet (20 mg total) by mouth daily., Disp: 30 tablet, Rfl: 2 .  hydroxychloroquine (PLAQUENIL) 200 MG tablet, Take 1 tablet (200 mg total) by mouth 2 (two) times daily., Disp: 60 tablet, Rfl: 2 .  Ipratropium-Albuterol (COMBIVENT RESPIMAT) 20-100 MCG/ACT AERS respimat, INHALE TWO PUFFS INTO LUNGS  EVERY 6 HOURS AS NEEDED FOR SHORTNESS OF BREATH, Disp: 1 Inhaler, Rfl: 5 .  metoprolol succinate (TOPROL-XL) 100 MG 24 hr tablet, Take 1 tablet (100 mg total) by mouth daily. Take with or immediately following a meal.,  Disp: 90 tablet, Rfl: 1 .  MYRBETRIQ 50 MG TB24 tablet, , Disp: , Rfl: 11 .  Nebulizers (COMPRESSOR/NEBULIZER) MISC, Dx COPD, use as needed, Disp: 1 each, Rfl: 0 .  predniSONE (DELTASONE) 10 MG tablet, Take 4 tablets by mouth daily with breakfast, Disp: 28 tablet, Rfl: 0 .  pregabalin (LYRICA) 100 MG capsule, Take 1-2 capsules (100-200 mg total) by mouth 2 (two) times daily. 1 tab in the am and 2 tabs at night. (Patient taking differently: Take 200 mg by mouth 2 (two) times daily. ), Disp: 90 capsule, Rfl: 1 .  QUEtiapine (SEROQUEL) 50 MG tablet, Take 50 mg by mouth at bedtime., Disp: , Rfl:  .  ranitidine (ZANTAC) 150 MG capsule, Take 1 capsule (150 mg total) by mouth 2 (two) times daily., Disp: 90 capsule, Rfl: 1 .  verapamil (CALAN-SR) 120 MG CR tablet, Take 1 tablet (120 mg total) by mouth at bedtime., Disp: 30 tablet, Rfl: 2  Past Medical History: Past Medical History:  Diagnosis Date  . Anxiety    takes Ativan daily, Panic attack   . Arthritis   . Bleeding ulcer 1974  . Breast cancer ([place])    right breast 2000  . Cancer [facility])    Surgery only, Lung Cancer- surgery only  . COPD (chronic obstructive pulmonary disease) ([place])   . Depression   . Fibromyalgia    takes Lyrica daily  . Head injury  with loss of consciousness ([place])   . Headache(784.0)    takes Tegretol and Verapamil nightly  . History of blood transfusion    no abnormal reaction noted  . History of bronchitis   . History of colon polyps    benign  . History of kidney stones    several  . History of ulcer disease    pyloric   . Hyperlipidemia    takes Fenofibrate daily  . Hypertension    takes Toprol daily  . Hypertension   . Joint pain   . Lung mass April 2013   Right lower lobe lung mass  . Lupus ([place])    takes Cymbalta,Plaquenil  daily  . Pneumonia    hx of  . PTSD (post-traumatic stress disorder)    takes Cymbalta daily  . Pulmonary embolism ([place]) 2006   IVC filter  . Seasonal allergies    flonase  prn  . Vertigo    takes Antivert prn    Tobacco Use: Social History   Tobacco Use  Smoking Status Current Every Day Smoker  . Packs/day: 1.00  . Types: Cigarettes  . Last attempt to quit: 09/20/2016  . Years since quitting: 0.9  Smokeless Tobacco Never Used  Tobacco Comment   pt started back smoking 08/08/16    Labs: Recent Review Flowsheet Data    Labs for ITP Cardiac and Pulmonary Rehab Latest Ref Rng & Units 01/06/2012 07/13/2014 07/16/2014   PHART 7.350 - 7.450 7.395 7.398 7.324(L)   PCO2ART 35.0 - 45.0 mmHg 37.6 36.0 44.0   HCO3 20.0 - 24.0 mEq/L 22.5 21.7 22.2   TCO2 0 - 100 mmol/L 19.8 22.8 23.6   ACIDBASEDEF 0.0 - 2.0 mmol/L 1.5 2.3(H) 2.9(H)   O2SAT % 94.1 96.2 95.5      Capillary Blood Glucose: Lab Results  Component Value Date   GLUCAP 187 (H) 09/27/2016   GLUCAP 128 (H) 07/19/2014   GLUCAP 95 07/18/2014   GLUCAP 122 (H) 07/18/2014   GLUCAP 109 (H) 07/18/2014     Pulmonary Assessment Scores: Pulmonary Assessment Scores    Row Name 07/04/17 1011 07/16/17 1010       ADL UCSD   ADL Phase  Entry  Entry    SOB Score total  -  63      CAT Score   CAT Score  -  12      mMRC Score   mMRC Score  1  -       Pulmonary Function Assessment:   Exercise Target Goals:    Exercise Program Goal: Individual exercise prescription set using results from initial 6 min walk test and THRR while considering  patient's activity barriers and safety.    Exercise Prescription Goal: Initial exercise prescription builds to 30-45 minutes a day of aerobic activity, 2-3 days per week.  Home exercise guidelines will be given to patient during program as part of exercise prescription that the participant will acknowledge.  Activity Barriers & Risk Stratification: Activity Barriers & Cardiac Risk Stratification - 06/28/17 1103      Activity Barriers & Cardiac Risk Stratification   Activity Barriers  Left Hip Replacement;Shortness of Breath;Left Knee Replacement;Right Knee  Replacement;History of Falls;Balance Concerns;Incisional Pain;Back Problems;Arthritis;Fibromyalgia    Cardiac Risk Stratification  High       6 Minute Walk: 6 Minute Walk    Row Name 07/04/17 0907         6 Minute Walk   Phase  Initial     Distance  736 feet     Walk Time  6 minutes     # of Rest Breaks  - 20 seconds standing     MPH  1.39     METS  2.07     RPE  14     Perceived Dyspnea   2     Symptoms  Yes (comment)     Comments  used wheelchair after 2nd lap     Resting HR  73 bpm     Resting BP  98/58     Resting Oxygen Saturation   94 %     Exercise Oxygen Saturation  during 6 min walk  88 %     Max Ex. HR  91 bpm     Max Ex. BP  130/62       Interval HR   1 Minute HR  91     2 Minute HR  86     3 Minute HR  76     4 Minute HR  78     5 Minute HR  89     6 Minute HR  89     2 Minute Post HR  77     Interval Heart Rate?  Yes       Interval Oxygen   Interval Oxygen?  Yes     Baseline Oxygen Saturation %  94 %     1 Minute Oxygen Saturation %  92 %     1 Minute Liters of Oxygen  0 L     2 Minute Oxygen Saturation %  92 %     2 Minute Liters of Oxygen  0 L     3 Minute Oxygen Saturation %  93 %     3 Minute Liters of Oxygen  0 L     4 Minute Oxygen Saturation %  93 %     4 Minute Liters of Oxygen  0 L     5 Minute Oxygen Saturation %  88 %     5 Minute Liters of Oxygen  0 L     6 Minute Oxygen Saturation %  88 %     6 Minute Liters of Oxygen  0 L     2 Minute Post Oxygen Saturation %  95 %     2 Minute Post Liters of Oxygen  0 L        Oxygen Initial Assessment: Oxygen Initial Assessment - 07/04/17 0906      Initial 6 min Walk   Oxygen Used  None      Program Oxygen Prescription   Program Oxygen Prescription  None did drop to 88% during 6MWT-will keep a close eye on her o2 sats with exercise       Oxygen Re-Evaluation: Oxygen Re-Evaluation    Row Name 07/09/17 0741 08/02/17 1003 08/26/17 0924         Program Oxygen Prescription   Program  Oxygen Prescription  None  None  None       Home Oxygen   Home Oxygen Device  None  None  None     Sleep Oxygen Prescription  None  None  None     Home Exercise Oxygen Prescription  None  None  None     Home at Rest Exercise Oxygen Prescription  None  None  None        Oxygen Discharge (Final Oxygen Re-Evaluation): Oxygen Re-Evaluation - 08/26/17 0924      Program  Oxygen Prescription   Program Oxygen Prescription  None      Home Oxygen   Home Oxygen Device  None    Sleep Oxygen Prescription  None    Home Exercise Oxygen Prescription  None    Home at Rest Exercise Oxygen Prescription  None       Initial Exercise Prescription: Initial Exercise Prescription - 07/04/17 0900      Date of Initial Exercise RX and Referring Provider   Date  07/02/17    Referring Provider  Dr. Annamaria Boots      NuStep   Level  2    SPM  80    Minutes  17    METs  1.5      Arm Ergometer   Level  1    Watts  10    Minutes  17      Track   Laps  5    Minutes  17      Prescription Details   Frequency (times per week)  2    Duration  Progress to 45 minutes of aerobic exercise without signs/symptoms of physical distress      Intensity   THRR 40-80% of Max Heartrate  60-121    Ratings of Perceived Exertion  11-13    Perceived Dyspnea  0-4      Progression   Progression  Continue progressive overload as per policy without signs/symptoms or physical distress.      Resistance Training   Training Prescription  Yes    Weight  orange bands    Reps  10-15       Perform Capillary Blood Glucose checks as needed.  Exercise Prescription Changes: Exercise Prescription Changes    Row Name 07/16/17 1300 07/30/17 1200 08/01/17 1137         Response to Exercise   Blood Pressure (Admit)  110/56  126/58  118/50     Blood Pressure (Exercise)  110/60  132/60  110/50     Blood Pressure (Exit)  100/60  112/58  90/50     Heart Rate (Admit)  74 bpm  73 bpm  54 bpm     Heart Rate (Exercise)  70 bpm  92  bpm  61 bpm     Heart Rate (Exit)  63 bpm  60 bpm  61 bpm     Oxygen Saturation (Admit)  97 %  95 %  98 %     Oxygen Saturation (Exercise)  95 %  94 %  98 %     Oxygen Saturation (Exit)  96 %  97 %  96 %     Rating of Perceived Exertion (Exercise)  _0 Perceived Dyspnea (Exercise)  2  0  1     Duration  Progress to 45 minutes of aerobic exercise without signs/symptoms of physical distress  Progress to 45 minutes of aerobic exercise without signs/symptoms of physical distress  Progress to 45 minutes of aerobic exercise without signs/symptoms of physical distress     Intensity  - 40-80% HRR  THRR unchanged  THRR unchanged       Progression   Progression  -  Continue to progress workloads to maintain intensity without signs/symptoms of physical distress.  Continue to progress workloads to maintain intensity without signs/symptoms of physical distress.       Resistance Training   Training Prescription  Yes  Yes  Yes     Weight  orange bands  orange bands  orange bands     Reps  10-15  10-15  10-15     Time  10 Minutes  10 Minutes  10 Minutes       Interval Training   Interval Training  No  No  No       NuStep   Level  _0 SPM  80  80  80     Minutes  17  51  34     METs  1.8  1.9  1.9       Arm Ergometer   Level  - consult with dietician  -  -       Track   Laps  5  -  -     Minutes  17  -  -        Exercise Comments:   Exercise Goals and Review: Exercise Goals    Row Name 06/28/17 1108             Exercise Goals   Increase Physical Activity  Yes       Intervention  Provide advice, education, support and counseling about physical activity/exercise needs.;Develop an individualized exercise prescription for aerobic and resistive training based on initial evaluation findings, risk stratification, comorbidities and participant's personal goals.       Expected Outcomes  Short Term: Attend rehab on a regular basis to increase amount of physical  activity.;Long Term: Add in home exercise to make exercise part of routine and to increase amount of physical activity.;Long Term: Exercising regularly at least 3-5 days a week.       Increase Strength and Stamina  Yes       Intervention  Provide advice, education, support and counseling about physical activity/exercise needs.;Develop an individualized exercise prescription for aerobic and resistive training based on initial evaluation findings, risk stratification, comorbidities and participant's personal goals.       Expected Outcomes  Short Term: Increase workloads from initial exercise prescription for resistance, speed, and METs.;Short Term: Perform resistance training exercises routinely during rehab and add in resistance training at home;Long Term: Improve cardiorespiratory fitness, muscular endurance and strength as measured by increased METs and functional capacity (6MWT)       Able to understand and use rate of perceived exertion (RPE) scale  Yes       Intervention  Provide education and explanation on how to use RPE scale       Expected Outcomes  Short Term: Able to use RPE daily in rehab to express subjective intensity level;Long Term:  Able to use RPE to guide intensity level when exercising independently       Able to understand and use Dyspnea scale  Yes       Intervention  Provide education and explanation on how to use Dyspnea scale       Expected Outcomes  Short Term: Able to use Dyspnea scale daily in rehab to express subjective sense of shortness of breath during exertion;Long Term: Able to use Dyspnea scale to guide intensity level when exercising independently       Knowledge and understanding of Target Heart Rate Range (THRR)  Yes       Intervention  Provide education and explanation of THRR including how the numbers were predicted and where they are located for reference       Expected Outcomes  Short Term: Able to state/look up THRR;Long Term: Able to use THRR to govern intensity  when exercising independently;Short Term: Able to use daily as guideline for intensity in rehab       Understanding of Exercise Prescription  Yes       Intervention  Provide education, explanation, and written materials on patient's individual exercise prescription       Expected Outcomes  Short Term: Able to explain program exercise prescription;Long Term: Able to explain home exercise prescription to exercise independently          Exercise Goals Re-Evaluation : Exercise Goals Re-Evaluation    Row Name 07/09/17 0741 08/02/17 1003 08/26/17 0925         Exercise Goal Re-Evaluation   Exercise Goals Review  Increase Strength and Stamina;Able to understand and use Dyspnea scale;Increase Physical Activity;Able to understand and use rate of perceived exertion (RPE) scale;Knowledge and understanding of Target Heart Rate Range (THRR);Understanding of Exercise Prescription  Increase Strength and Stamina;Able to understand and use Dyspnea scale;Increase Physical Activity;Able to understand and use rate of perceived exertion (RPE) scale;Knowledge and understanding of Target Heart Rate Range (THRR);Understanding of Exercise Prescription  Increase Strength and Stamina;Able to understand and use Dyspnea scale;Increase Physical Activity;Able to understand and use rate of perceived exertion (RPE) scale;Knowledge and understanding of Target Heart Rate Range (THRR);Understanding of Exercise Prescription     Comments  Patient is anticipated to start her first day of exercise today (07/09/17)  Patient is struggling with hip pain. This is a major barrier for her. Progression has not been made. Met level places her in a low level. Is not walking on the track due to pain. Only non-weight bearing exercises  Patient is struggling with hip pain. This is a major barrier for her. Progression has not been made. Met level places her in a low level. Is not walking on the track due to pain. Only non-weight bearing exercises      Expected Outcomes  Through exercise at rehab and at home, the patient will decrease shortness of breath with daily activities and feel confident in carrying out an exercise regime at home.   Through exercise at rehab and at home, the patient will decrease shortness of breath with daily activities and feel confident in carrying out an exercise regime at home.   Through exercise at rehab and at home, the patient will decrease shortness of breath with daily activities and feel confident in carrying out an exercise regime at home.         Discharge Exercise Prescription (Final Exercise Prescription Changes): Exercise Prescription Changes - 08/01/17 1137      Response to Exercise   Blood Pressure (Admit)  118/50    Blood Pressure (Exercise)  110/50    Blood Pressure (Exit)  90/50    Heart Rate (Admit)  54 bpm    Heart Rate (Exercise)  61 bpm    Heart Rate (Exit)  61 bpm    Oxygen Saturation (Admit)  98 %    Oxygen Saturation (Exercise)  98 %    Oxygen Saturation (Exit)  96 %    Rating of Perceived Exertion (Exercise)  11    Perceived Dyspnea (Exercise)  1    Duration  Progress to 45 minutes of aerobic exercise without signs/symptoms of physical distress    Intensity  THRR unchanged      Progression   Progression  Continue to progress workloads to maintain intensity without signs/symptoms of physical distress.      Resistance Training   Training Prescription  Yes    Weight  orange  bands    Reps  10-15    Time  10 Minutes      Interval Training   Interval Training  No      NuStep   Level  2    SPM  80    Minutes  34    METs  1.9       Nutrition:  Target Goals: Understanding of nutrition guidelines, daily intake of sodium <1560m, cholesterol <207m calories 30% from fat and 7% or less from saturated fats, daily to have 5 or more servings of fruits and vegetables.  Biometrics:    Nutrition Therapy Plan and Nutrition Goals: Nutrition Therapy & Goals - 07/04/17 1359       Nutrition Therapy   Diet  General, healthful      Intervention Plan   Intervention  Prescribe, educate and counsel regarding individualized specific dietary modifications aiming towards targeted core components such as weight, hypertension, lipid management, diabetes, heart failure and other comorbidities.    Expected Outcomes  Short Term Goal: Understand basic principles of dietary content, such as calories, fat, sodium, cholesterol and nutrients.;Long Term Goal: Adherence to prescribed nutrition plan.       Nutrition Assessments: Nutrition Assessments - 07/04/17 1359      Rate Your Plate Scores   Pre Score  47       Nutrition Goals Re-Evaluation:   Nutrition Goals Discharge (Final Nutrition Goals Re-Evaluation):   Psychosocial: Target Goals: Acknowledge presence or absence of significant depression and/or stress, maximize coping skills, provide positive support system. Participant is able to verbalize types and ability to use techniques and skills needed for reducing stress and depression.  Initial Review & Psychosocial Screening: Initial Psych Review & Screening - 06/28/17 1053      Initial Review   Current issues with  Current Depression;Current Anxiety/Panic;Current Psychotropic Meds;Current Sleep Concerns;Current Stress Concerns    Source of Stress Concerns  Family;Financial;Transportation;Unable to participate in former interests or hobbies;Unable to perform yard/household activities;Chronic Illness;Poor Coping Skills    Comments  Pt with multiple stressors- strained relationships with son and daughte.      Family Dynamics   Good Support System?  No    Strains  Intra-family strains    Comments  support of son who lives with her      Barriers   Psychosocial barriers to participate in program  The patient should benefit from training in stress management and relaxation.      Screening Interventions   Interventions  Encouraged to exercise;To provide support and  resources with identified psychosocial needs    Expected Outcomes  Short Term goal: Utilizing psychosocial counselor, staff and physician to assist with identification of specific Stressors or current issues interfering with healing process. Setting desired goal for each stressor or current issue identified.;Long Term Goal: Stressors or current issues are controlled or eliminated.       Quality of Life Scores:  Scores of 19 and below usually indicate a poorer quality of life in these areas.  A difference of  2-3 points is a clinically meaningful difference.  A difference of 2-3 points in the total score of the Quality of Life Index has been associated with significant improvement in overall quality of life, self-image, physical symptoms, and general health in studies assessing change in quality of life.   PHQ-9: Recent Review Flowsheet Data    [facility] 08/14/2017 06/28/2017 06/06/2017 04/17/2017   Decreased Interest 0 1 0 0   Down, Depressed, Hopeless 0  2 0 0   PHQ - 2 Score 0 3 0 0   Altered sleeping - 1 - -   Tired, decreased energy - 1 - -   Change in appetite - 3 - -   Feeling bad or failure about yourself  - 0 - -   Trouble concentrating - 0 - -   Moving slowly or fidgety/restless - 0 - -   Suicidal thoughts - 0 - -   PHQ-9 Score - 8 - -   Difficult doing work/chores - Somewhat difficult - -     Interpretation of Total Score  Total Score Depression Severity:  1-4 = Minimal depression, 5-9 = Mild depression, 10-14 = Moderate depression, 15-19 = Moderately severe depression, 20-27 = Severe depression   Psychosocial Evaluation and Intervention: Psychosocial Evaluation - 08/26/17 1621      Psychosocial Evaluation & Interventions   Interventions  Stress management education;Relaxation education    Comments  Pt sees a counselor weeklly. Pt has strong faith base.    Expected Outcomes  Pt will use learned techniques to cope with stress in a positive and healthy means.   Pt will continue to have a positive outllook on life and her future.    Continue Psychosocial Services   Follow up required by staff       Psychosocial Re-Evaluation: Psychosocial Re-Evaluation    [place] Name 07/08/17 1711 08/05/17 1458 08/26/17 1622         Psychosocial Re-Evaluation   Current issues with  Current Stress Concerns;Current Anxiety/Panic;Current Psychotropic Meds;Current Sleep Concerns;Current Depression  Current Stress Concerns;Current Anxiety/Panic;Current Psychotropic Meds;Current Sleep Concerns;Current Depression  Current Stress Concerns;Current Anxiety/Panic;Current Psychotropic Meds;Current Sleep Concerns;Current Depression     Comments  Pt has multiple complex issues within her family and her present living situation.  pt desires to return to church on a regular and consistent basis.  Pt has multiple complex issues within her family and her present living situation.  pt desires to return to church on a regular and consistent basis.  Pt has multiple complex issues within her family and her present living situation.  pt desires to return to church on a regular and consistent basis.     Expected Outcomes  -  -  Pt will develop techniques to mangae her stressors and anxiety.  Recognize when she needs help.     Interventions  Relaxation education;Stress management education;Encouraged to attend Pulmonary Rehabilitation for the exercise  Relaxation education;Stress management education;Encouraged to attend Pulmonary Rehabilitation for the exercise  Relaxation education;Stress management education;Encouraged to attend Pulmonary Rehabilitation for the exercise     Continue Psychosocial Services   -  - sees a counselor weekly  -     Comments  Pt with multiple stressors- strained relationships with son and daughte.  Pt with multiple stressors- strained relationships with son and daughte.  Pt with multiple stressors- strained relationships with son and daughter.       Initial Review    Source of Stress Concerns  Family;Financial;Transportation;Unable to participate in former interests or hobbies;Unable to perform yard/household activities;Chronic Illness;Poor Coping Skills  Family;Financial;Transportation;Unable to participate in former interests or hobbies;Unable to perform yard/household activities;Chronic Illness;Poor Coping Skills  Family;Financial;Transportation;Unable to participate in former interests or hobbies;Unable to perform yard/household activities;Chronic Illness;Poor Coping Skills        Psychosocial Discharge (Final Psychosocial Re-Evaluation): Psychosocial Re-Evaluation - 08/26/17 1622      Psychosocial Re-Evaluation   Current issues with  Current Stress Concerns;Current Anxiety/Panic;Current Psychotropic Meds;Current Sleep Concerns;Current  Depression    Comments  Pt has multiple complex issues within her family and her present living situation.  pt desires to return to church on a regular and consistent basis.    Expected Outcomes  Pt will develop techniques to mangae her stressors and anxiety.  Recognize when she needs help.    Interventions  Relaxation education;Stress management education;Encouraged to attend Pulmonary Rehabilitation for the exercise    Comments  Pt with multiple stressors- strained relationships with son and daughter.      Initial Review   Source of Stress Concerns  Family;Financial;Transportation;Unable to participate in former interests or hobbies;Unable to perform yard/household activities;Chronic Illness;Poor Coping Skills       Education: Education Goals: Education classes will be provided on a weekly basis, covering required topics. Participant will state understanding/return demonstration of topics presented.  Learning Barriers/Preferences: Learning Barriers/Preferences - 06/28/17 1102      Learning Barriers/Preferences   Learning Barriers  Sight;Hearing    Learning Preferences  Individual Instruction;Verbal Instruction        Education Topics: Risk Factor Reduction:  -Group instruction that is supported by a PowerPoint presentation. Instructor discusses the definition of a risk factor, different risk factors for pulmonary disease, and how the heart and lungs work together.     Nutrition for Pulmonary Patient:  -Group instruction provided by PowerPoint slides, verbal discussion, and written materials to support subject matter. The instructor gives an explanation and review of healthy diet recommendations, which includes a discussion on weight management, recommendations for fruit and vegetable consumption, as well as protein, fluid, caffeine, fiber, sodium, sugar, and alcohol. Tips for eating when patients are short of breath are discussed.   Pursed Lip Breathing:  -Group instruction that is supported by demonstration and informational handouts. Instructor discusses the benefits of pursed lip and diaphragmatic breathing and detailed demonstration on how to preform both.     Oxygen Safety:  -Group instruction provided by PowerPoint, verbal discussion, and written material to support subject matter. There is an overview of "What is Oxygen" and "Why do we need it".  Instructor also reviews how to create a safe environment for oxygen use, the importance of using oxygen as prescribed, and the risks of noncompliance. There is a brief discussion on traveling with oxygen and resources the patient may utilize.   Oxygen Equipment:  -Group instruction provided by [facility] Staff utilizing handouts, written materials, and equipment demonstrations.   Signs and Symptoms:  -Group instruction provided by written material and verbal discussion to support subject matter. Warning signs and symptoms of infection, stroke, and heart attack are reviewed and when to call the physician/911 reinforced. Tips for preventing the spread of infection discussed.   Advanced Directives:  -Group instruction provided by verbal instruction  and written material to support subject matter. Instructor reviews Advanced Directive laws and proper instruction for filling out document.   Pulmonary Video:  -Group video education that reviews the importance of medication and oxygen compliance, exercise, good nutrition, pulmonary hygiene, and pursed lip and diaphragmatic breathing for the pulmonary patient.   Exercise for the Pulmonary Patient:  -Group instruction that is supported by a PowerPoint presentation. Instructor discusses benefits of exercise, core components of exercise, frequency, duration, and intensity of an exercise routine, importance of utilizing pulse oximetry during exercise, safety while exercising, and options of places to exercise outside of rehab.     Pulmonary Medications:  -Verbally interactive group education provided by instructor with focus on inhaled medications and proper administration.  Anatomy and Physiology of the Respiratory System and Intimacy:  -Group instruction provided by PowerPoint, verbal discussion, and written material to support subject matter. Instructor reviews respiratory cycle and anatomical components of the respiratory system and their functions. Instructor also reviews differences in obstructive and restrictive respiratory diseases with examples of each. Intimacy, Sex, and Sexuality differences are reviewed with a discussion on how relationships can change when diagnosed with pulmonary disease. Common sexual concerns are reviewed.   MD DAY -A group question and answer session with a medical doctor that allows participants to ask questions that relate to their pulmonary disease state.   PULMONARY REHAB CHRONIC OBSTRUCTIVE PULMONARY DISEASE from 08/01/2017 in [place]  Date  07/11/17  Educator  Dr. Nelda Marseille  Instruction Review Code  1- Verbalizes Understanding      OTHER EDUCATION -Group or individual verbal, written, or video instructions that support  the educational goals of the pulmonary rehab program.   PULMONARY REHAB CHRONIC OBSTRUCTIVE PULMONARY DISEASE from 08/01/2017 in [place]  Date  08/01/17  Educator  [place]  Instruction Review Code  1- Verbalizes Understanding      Holiday Eating Survival Tips:  -Group instruction provided by PowerPoint slides, verbal discussion, and written materials to support subject matter. The instructor gives patients tips, tricks, and techniques to help them not only survive but enjoy the holidays despite the onslaught of food that accompanies the holidays.   Knowledge Questionnaire Score: Knowledge Questionnaire Score - 07/16/17 1010      Knowledge Questionnaire Score   Pre Score  15/18       Core Components/Risk Factors/Patient Goals at Admission: Personal Goals and Risk Factors at Admission - 06/28/17 1217      Core Components/Risk Factors/Patient Goals on Admission    Weight Management  Weight Loss;Obesity       Core Components/Risk Factors/Patient Goals Review:  Goals and Risk Factor Review    Row Name 07/08/17 1707 08/05/17 1457 08/26/17 1615         Core Components/Risk Factors/Patient Goals Review   Personal Goals Review  Weight Management/Obesity;Develop more efficient breathing techniques such as purse lipped breathing and diaphragmatic breathing and practicing self-pacing with activity.;Stress;Tobacco Cessation;Increase knowledge of respiratory medications and ability to use respiratory devices properly.;Hypertension;Lipids;Improve shortness of breath with ADL's  Weight Management/Obesity;Develop more efficient breathing techniques such as purse lipped breathing and diaphragmatic breathing and practicing self-pacing with activity.;Stress;Tobacco Cessation;Increase knowledge of respiratory medications and ability to use respiratory devices properly.;Hypertension;Lipids;Improve shortness of breath with ADL's   Weight Management/Obesity;Develop more efficient breathing techniques such as purse lipped breathing and diaphragmatic breathing and practicing self-pacing with activity.;Stress;Tobacco Cessation;Increase knowledge of respiratory medications and ability to use respiratory devices properly.;Improve shortness of breath with ADL's     Review  pt will begin full exercise on 6/4.  Unable to assess goals at this time.  Will montior pt progress toward achieving these goals in the next 30 days.  having hip pain, scheduled for bone scan 08/07/17 to evaluate for prosthesis loosening, this has slowed her progression to goals due to chronic pain in hip  Pt bone scan to her hip was negative.  Pt will not need any additional intervention.  Hopefully pt will be able to increase her activity level as tolerated.  Pt continues to have multiple stressors with her family, finances and health, Pt is encouraged to practice PLB and diagphragmatic breathing  Pt does need cues and would  like to see pt be more proactive with this.  Pt often missess education classess due to late arrival. on Thursdays.  Pt with resolutions of patient goals for HTN and lipids.  This is well managed and no longer applicable. Pt with weight loss of 1.9 kg since starting on 6/4. Pt has quit date for smoking which is on her birthday.  Pt birthday was [birth year] and she was absent due to needing to pick up food from the food bank.     Expected Outcomes  see "admission outcomes"  see "admission outcomes"  See Admission Goals        Core Components/Risk Factors/Patient Goals at Discharge (Final Review):  Goals and Risk Factor Review - 08/26/17 1615      Core Components/Risk Factors/Patient Goals Review   Personal Goals Review  Weight Management/Obesity;Develop more efficient breathing techniques such as purse lipped breathing and diaphragmatic breathing and practicing self-pacing with activity.;Stress;Tobacco Cessation;Increase knowledge of respiratory medications  and ability to use respiratory devices properly.;Improve shortness of breath with ADL's    Review  Pt bone scan to her hip was negative.  Pt will not need any additional intervention.  Hopefully pt will be able to increase her activity level as tolerated.  Pt continues to have multiple stressors with her family, finances and health, Pt is encouraged to practice PLB and diagphragmatic breathing  Pt does need cues and would like to see pt be more proactive with this.  Pt often missess education classess due to late arrival. on Thursdays.  Pt with resolutions of patient goals for HTN and lipids.  This is well managed and no longer applicable. Pt with weight loss of 1.9 kg since starting on 6/4. Pt has quit date for smoking which is on her birthday.  Pt birthday was [birth year] and she was absent due to needing to pick up food from the food bank.    Expected Outcomes  See Admission Goals       ITP Comments: ITP Comments    [place] Name 06/28/17 1014 07/10/17 1054 08/26/17 1614       ITP Comments  Dr. Jennet Maduro, Medical Director  Dr. Jennet Maduro, Medical Director  Dr. Jennet Maduro, Medical Director        Comments:  Pt has completed 7 exercise sessions. Cherre Huger, BSN Cardiac and Training and development officer

## 2017-08-27 ENCOUNTER — Other Ambulatory Visit: Payer: Self-pay | Admitting: Family Medicine

## 2017-08-27 ENCOUNTER — Encounter (HOSPITAL_COMMUNITY)
Admission: RE | Admit: 2017-08-27 | Discharge: 2017-08-27 | Disposition: A | Discharge status: Home / Self Care | Payer: Medicare Other | Source: Ambulatory Visit | Attending: Internal Medicine | Admitting: Internal Medicine

## 2017-08-27 VITALS — Wt 218.0 lb

## 2017-08-27 DIAGNOSIS — J449 Chronic obstructive pulmonary disease, unspecified: Secondary | ICD-10-CM | POA: Diagnosis not present

## 2017-08-27 MED ORDER — FENOFIBRATE 145 MG PO TABS: 145.0000 mg | ORAL_TABLET | Freq: Every day | ORAL | 2 refills | Status: DC

## 2017-08-27 NOTE — Progress Notes (Signed)
Daily Session Note  Patient Details  Name: Breanna Dixon MRN: [id number] Date of Birth: 03/12/1945 Referring Provider:     Pulmonary Rehab Walk Test from 07/02/2017 in [place]  Referring Provider  Dr. Annamaria Boots      Encounter Date: 08/27/2017  Check In: Session Check In - 08/27/17 1030      Check-In   Location  [facility]    Staff Present  Rosebud Poles, RN, BSN;Carlette Carlton, RN, [facility], MS, ACSM RCEP, Exercise Physiologist;Lisa Ysidro Evert, Felipe Drone, RN, [facility]    Supervising physician immediately available to respond to emergencies  [facility] immediately available    Physician(s)  Dr. Denton Brick    Medication changes reported      No    Fall or balance concerns reported     No    Tobacco Cessation  No Change    Warm-up and Cool-down  Performed as group-led instruction    Resistance Training Performed  Yes    VAD Patient?  No    PAD/SET Patient?  No      Pain Assessment   Currently in Pain?  No/denies    Multiple Pain Sites  No       Capillary Blood Glucose: No results found for this or any previous visit (from the past 24 hour(s)).  Exercise Prescription Changes - 08/27/17 1200      Response to Exercise   Blood Pressure (Admit)  132/62    Blood Pressure (Exercise)  100/60    Blood Pressure (Exit)  102/60    Heart Rate (Admit)  71 bpm    Heart Rate (Exercise)  74 bpm    Heart Rate (Exit)  60 bpm    Oxygen Saturation (Admit)  98 %    Oxygen Saturation (Exercise)  97 %    Oxygen Saturation (Exit)  91 %    Rating of Perceived Exertion (Exercise)  13    Perceived Dyspnea (Exercise)  2    Duration  Progress to 45 minutes of aerobic exercise without signs/symptoms of physical distress    Intensity  THRR unchanged      Progression   Progression  Continue to progress workloads to maintain intensity without signs/symptoms of physical distress.      Resistance Training   Training Prescription   Yes    Weight  orange bands    Reps  10-15    Time  10 Minutes      Interval Training   Interval Training  No      NuStep   Level  4    SPM  80    Minutes  17    METs  3      Arm Ergometer   Level  1    Watts  10    Minutes  17      Track   Laps  3    Minutes  17       Social History   Tobacco Use  Smoking Status Current Every Day Smoker  . Packs/day: 1.00  . Types: Cigarettes  . Last attempt to quit: 09/20/2016  . Years since quitting: 0.9  Smokeless Tobacco Never Used  Tobacco Comment   pt started back smoking 08/08/16    Goals Met:  Exercise tolerated well Strength training completed today  Goals Unmet:  Not Applicable  Comments: Service time is from 1030 to 1215    Dr. Rush Farmer is Medical Director for Pulmonary Rehab  at [facility].

## 2017-08-28 NOTE — Progress Notes (Signed)
Signed           Janece A [place] 72 y.o. female  29 day Psychosocial Note  Patient psychosocial assessment reveals  barriers to participation in Pulmonary Rehab. The barrier that is preventing her to progress in pulmonary rehab is hip pain which makes it very painful to exercise.  She is being evaluated by an orthopedic doctor and has a bone scan showed the prothesis was well situated and will not need additional surgery.    Patient does not  feel she is making progress toward Pulmonary Rehab goals due to the hip pain. Patient reports her health and activity level has not improved in the past 30 days. Pt is hopeful now that she has the go ahead from her hip she will be able to do more and attend on a consistent basis.  Pt with food insecurities and has been in contact with community resources to help bridge the gap.  Patient states family/friends have not noticed changes in her activity or mood. Patient reports not feeling positive about current and projected progression in Pulmonary Rehab again based on her hip pain. After reviewing the patient's treatment plan, the patient is not making progress toward Pulmonary Rehab goals. Patient's rate of progress toward rehab goals is fair. Plan of action to help patient continue to work toward these goals. Will continue to monitor and evaluate progress toward psychosocial goal(s).   Goal(s) in progress: Stress management for help with dealing with family issues and financial strain. Help patient work toward returning to meaningful activities that improve patient's QOL and are attainable with patient's lung disease  Carlette Armed forces operational officer, BSN Cardiac and Training and development officer

## 2017-08-29 ENCOUNTER — Encounter (HOSPITAL_COMMUNITY)
Admission: RE | Admit: 2017-08-29 | Discharge: 2017-08-29 | Disposition: A | Discharge status: Home / Self Care | Payer: Medicare Other | Source: Ambulatory Visit | Attending: Internal Medicine | Admitting: Internal Medicine

## 2017-08-29 DIAGNOSIS — J449 Chronic obstructive pulmonary disease, unspecified: Secondary | ICD-10-CM | POA: Diagnosis not present

## 2017-08-29 NOTE — Progress Notes (Signed)
Breanna Dixon presents to pulmonary rehab for her bi-weekly exercise session. I have completed her thirty day face to face review and determined that Breanna Dixon is on track for meeting their pulmonary rehab goals. There are no barriers identified that will prevent them from continuing their exercise in pulmonary rehab as prescribed.   Rush Farmer, M.D. [facility] Pulmonary/Critical Care Medicine. Pager: [phone]. After hours pager: [phone].

## 2017-08-30 ENCOUNTER — Ambulatory Visit (HOSPITAL_COMMUNITY): Payer: Self-pay | Admitting: *Deleted

## 2017-08-30 ENCOUNTER — Other Ambulatory Visit: Payer: Self-pay | Admitting: *Deleted

## 2017-08-30 MED ORDER — FUROSEMIDE 20 MG PO TABS: 20.0000 mg | ORAL_TABLET | Freq: Every day | ORAL | 2 refills | Status: DC

## 2017-08-30 NOTE — Progress Notes (Signed)
Breanna Dixon presents to pulmonary rehab for her bi-weekly exercise session. I have completed her thirty day face to face review and determined that [name] is on track for meeting their pulmonary rehab goals. There are no barriers identified that will prevent them from continuing their exercise in pulmonary rehab as prescribed.   Rush Farmer, M.D. [facility] Pulmonary/Critical Care Medicine. Pager: [phone]. After hours pager: [phone].

## 2017-09-02 ENCOUNTER — Ambulatory Visit (INDEPENDENT_AMBULATORY_CARE_PROVIDER_SITE_OTHER): Payer: Medicare Other | Admitting: Internal Medicine

## 2017-09-02 ENCOUNTER — Encounter: Payer: Self-pay | Admitting: Internal Medicine

## 2017-09-02 VITALS — BP 130/64 | HR 71 | Ht 67.0 in | Wt 216.2 lb

## 2017-09-02 DIAGNOSIS — J449 Chronic obstructive pulmonary disease, unspecified: Secondary | ICD-10-CM | POA: Diagnosis not present

## 2017-09-02 DIAGNOSIS — J8410 Pulmonary fibrosis, unspecified: Secondary | ICD-10-CM | POA: Diagnosis not present

## 2017-09-02 DIAGNOSIS — Z720 Tobacco use: Secondary | ICD-10-CM

## 2017-09-02 MED ORDER — ALBUTEROL SULFATE (2.5 MG/3ML) 0.083% IN NEBU: 2.5000 mg | INHALATION_SOLUTION | Freq: Four times a day (QID) | RESPIRATORY_TRACT | 12 refills | Status: AC | PRN

## 2017-09-02 NOTE — Assessment & Plan Note (Signed)
I strongly encouraged her to stick with her smoking cessation plan and anticipated quit date in August.

## 2017-09-02 NOTE — Assessment & Plan Note (Signed)
I do not expect progression but we need to watch for new problems. Plan-update CXR

## 2017-09-02 NOTE — Progress Notes (Signed)
HPI F former heavy smoker , former scrub nurse followed for  COPD, lung nodule RLL/ Carcinoid/right lower lobectomy, 76mm RUL nodule , Hx PE/ IVC filter, complicated by hx discoid lupus rash, hx bilateral breast Ca lumpectomies, old calcified granulomas PFT 05/16/2012: Mild obstructive airways disease with insignificant response to bronchodilator, normal lung volumes, diffusion mildly reduced. FVC 3.08/96%, FEV1 2.01/86%, FEV1/FVC 0.65. TLC 95%, DLCO 73%. Quant TB Gold assay NEG 05/16/12, CT chest 08/13/12- The nodule also shows no evidence (IR did not do needle bx.) of contrast enhancement. Given all of these factors as well as the  presence of multiple abutting blood vessels which may make the  biopsy of higher risk, decision was made to not perform a percutaneous biopsy V/Q in 2016 intermediate for PE- has filter Office Spirometry 09/10/16-severe obstructive airways disease. FVC 1.91/54%, FEV1 1.31/42%, ratio 0.76, FEF 25-75% 0.50/23%. --------------------------------------------------------------------------------------------------------------------  05/02/17-71 yoF  smoker , former scrub nurse followed for  COPD, lung nodule RLL/ Carcinoid/right lower lobectomy, 15mm RUL nodule , Hx PE/ IVC filter, complicated by hx discoid lupus rash, hx bilateral breast Ca lumpectomies, old calcified granulomas  ----COPD mixed type: Pt states her breathing is not good-was recently in Hospital at [facility] 8 days due to PNA and flu and resp failure.  Has runny nose all the time and notices a clacking soound when she breathes.  Combivent Respimat, albuterol nebulizer Despite her health problems she continues to smoke against advice.  Using nebulizer 2-4 times daily and Combivent Respimat.  Hospitalized with pneumonia in February.  Cough productive of nonpurulent, no blood.  Denies adenopathy, fever.  09/02/2017- 89 yoF  smoker , former scrub nurse followed for  COPD, lung nodule RLL/ Carcinoid/right lower lobectomy, 76mm RUL  nodule , Hx PE/ IVC filter, complicated by hx discoid lupus rash, hx bilateral breast Ca lumpectomies, old calcified granulomas ----COPD mixed type:  Working toward smoking cessation- August date. In [facility]. Combivent Respimat, Trelegy, nebulizer albuterol, She continues with pulmonary rehabilitation and thinks she is better off than other participants who are classified as COPD Gold 3.  She would like to update PFT which we can do at next visit.  She is still working on smoking cessation with a quit date in August.  Describes market dyspnea by the time she reaches the top of her stairs to bedroom and we discussed stopping to rest halfway up and pacing herself.  Denies chest pain or palpitation. CXR 05/02/2017- IMPRESSION: 1. Previous identified left lung infiltrates have cleared. No acute infiltrates noted. 2.  Right base pleural-parenchymal scarring again noted.  ROS-see HPI + = positive Constitutional:   No-   weight loss, night sweats, fevers, chills, + fatigue, lassitude. HEENT:   No-  headaches, difficulty swallowing, tooth/dental problems, sore throat,       No-  sneezing, itching, ear ache, nasal congestion, post nasal drip,  CV:  chest pain, orthopnea, PND, swelling in lower extremities, anasarca,  dizziness, palpitations Resp: +shortness of breath with exertion or at rest.              +cough,  No non-productive cough,  No- coughing up of blood.              No-   change in color of mucus.  + wheezing.   Skin:  rash or lesions. GI:  No-   heartburn, indigestion, abdominal pain, nausea, vomiting,  GU: . MS:  No-   joint pain or swelling. Neuro-     nothing unusual Psych:  No- change in mood or affect. No depression or anxiety.  No memory loss.  OBJ- Physical Exam General- Alert, Oriented, Affect-appropriate, Distress- none acute, +obese,  Skin- + rash dorsum left hand reported discoid lupus Lymphadenopathy- none Head- atraumatic            Eyes- Gross vision intact, PERRLA,  conjunctivae and secretions clear            Ears- Hearing, canals-normal            Nose- Clear, no-Septal dev, mucus, polyps, erosion, perforation             Throat- Mallampati II , mucosa clear , drainage- none, tonsils- atrophic. +Edentulous Neck- flexible , trachea midline, no stridor , thyroid nl, carotid no bruit Chest - symmetrical excursion , unlabored           Heart/CV- RRR , no murmur , no gallop  , no rub, nl s1 s2                           - JVD- none , edema- none, stasis changes- none, varices- none           Lung-clear, cough none, dullness-none, rub- none,             Chest wall-  Abd-  Br/ Gen/ Rectal- Not done, not indicated Extrem- cyanosis- none, clubbing, none, atrophy- none, strength- nl.               + bilateral TKR scars,  Neuro- grossly intact to observation

## 2017-09-02 NOTE — Patient Instructions (Addendum)
Ok to pace yourself !  Order CXR   Dx COPD mixed type  Proud of your work on smoking and Pulmonary Rehab  Please call if we can help  Order- neb solution script through a DME   Printed script for albuterol neb solution

## 2017-09-02 NOTE — Assessment & Plan Note (Signed)
She is looking better, more comfortable and stronger.  I am pleased with exam at this visit. Plan-continue meds, update CXR, continue pulmonary rehab.  Okay to stop on stairs to pace herself if needed.

## 2017-09-03 ENCOUNTER — Encounter (HOSPITAL_COMMUNITY)
Admission: RE | Admit: 2017-09-03 | Discharge: 2017-09-03 | Disposition: A | Discharge status: Home / Self Care | Payer: Medicare Other | Source: Ambulatory Visit | Attending: Internal Medicine | Admitting: Internal Medicine

## 2017-09-03 DIAGNOSIS — J449 Chronic obstructive pulmonary disease, unspecified: Secondary | ICD-10-CM | POA: Diagnosis not present

## 2017-09-03 NOTE — Progress Notes (Signed)
Daily Session Note  Patient Details  Name: Breanna Dixon MRN: [id number] Date of Birth: [birth year] Referring Provider:     Pulmonary Rehab Walk Test from 07/02/2017 in [place]  Referring Provider  Dr. Annamaria Boots      Encounter Date: 09/03/2017  Check In: Session Check In - 09/03/17 1053      Check-In   Supervising physician immediately available to respond to emergencies  [facility] immediately available    Physician(s)   Dr. Rodena Piety    Location  [facility]    Staff Present  Rodney Langton, RN;Carlette Wilber Oliphant, RN, BSN;Molly DiVincenzo, MS, ACSM RCEP, Exercise Physiologist    Medication changes reported      No    Fall or balance concerns reported     No    Tobacco Cessation  No Change    Warm-up and Cool-down  Performed as group-led instruction    Resistance Training Performed  Yes    VAD Patient?  No    PAD/SET Patient?  No      Pain Assessment   Currently in Pain?  No/denies    Multiple Pain Sites  No       Capillary Blood Glucose: No results found for this or any previous visit (from the past 24 hour(s)).    Social History   Tobacco Use  Smoking Status Current Every Day Smoker  . Packs/day: 1.00  . Types: Cigarettes  . Last attempt to quit: 09/20/2016  . Years since quitting: 0.9  Smokeless Tobacco Never Used  Tobacco Comment   pt started back smoking 08/08/16    Goals Met:  Exercise tolerated well No report of cardiac concerns or symptoms Strength training completed today  Goals Unmet:  Not Applicable  Comments: Service time is from 1030 to 1205    Dr. Rush Farmer is Medical Director for Pulmonary Rehab at [facility].

## 2017-09-04 ENCOUNTER — Other Ambulatory Visit: Payer: Self-pay

## 2017-09-04 ENCOUNTER — Ambulatory Visit (INDEPENDENT_AMBULATORY_CARE_PROVIDER_SITE_OTHER): Payer: Medicare Other | Admitting: Family Medicine

## 2017-09-04 ENCOUNTER — Other Ambulatory Visit: Payer: Self-pay | Admitting: Family Medicine

## 2017-09-04 ENCOUNTER — Encounter: Payer: Self-pay | Admitting: Family Medicine

## 2017-09-04 VITALS — BP 120/60 | HR 74 | Temp 98.7°F | Ht 67.0 in | Wt 215.0 lb

## 2017-09-04 DIAGNOSIS — H52209 Unspecified astigmatism, unspecified eye: Secondary | ICD-10-CM

## 2017-09-04 DIAGNOSIS — I781 Nevus, non-neoplastic: Secondary | ICD-10-CM | POA: Diagnosis not present

## 2017-09-04 DIAGNOSIS — R21 Rash and other nonspecific skin eruption: Secondary | ICD-10-CM | POA: Diagnosis not present

## 2017-09-04 DIAGNOSIS — Z716 Tobacco abuse counseling: Secondary | ICD-10-CM | POA: Diagnosis not present

## 2017-09-04 MED ORDER — FUROSEMIDE 20 MG PO TABS: 20.0000 mg | ORAL_TABLET | Freq: Every day | ORAL | 2 refills | Status: DC

## 2017-09-04 NOTE — Patient Instructions (Signed)
It was great seeing you again! I am glad that things went very well with the prednisone. I think your vision symptoms are likely due to this possible astigmatism. It is possible the the nicotine withdrawal has exacerbated this. I also think it is possible that this is what is causing the shaking. We will keep a close eye on these symptoms and see if they eventually go away as you quit smoking. If these get significantly worse please come back and see me.  Congratulations on setting your quit date! Good luck and let me know if there is anything I need to do. I think coming back in 2-3 weeks for the mole removal will be a good idea. I think that this will give [place] a good chance to see how quitting is going. I am sorry for the difficulty with getting your lasix filled. I have sent this in today.

## 2017-09-05 ENCOUNTER — Encounter (HOSPITAL_COMMUNITY)
Admission: RE | Admit: 2017-09-05 | Discharge: 2017-09-05 | Disposition: A | Discharge status: Home / Self Care | Payer: Medicare Other | Source: Ambulatory Visit | Attending: Internal Medicine | Admitting: Internal Medicine

## 2017-09-05 ENCOUNTER — Telehealth: Payer: Self-pay | Admitting: Internal Medicine

## 2017-09-05 VITALS — Wt 214.5 lb

## 2017-09-05 DIAGNOSIS — J449 Chronic obstructive pulmonary disease, unspecified: Secondary | ICD-10-CM

## 2017-09-05 NOTE — Progress Notes (Signed)
Daily Session Note  Patient Details  Name: Breanna Dixon MRN: [id number] Date of Birth: Jul 27, 1945 Referring Provider:     Pulmonary Rehab Walk Test from 07/02/2017 in [place]  Referring Provider  Dr. Annamaria Boots      Encounter Date: 09/05/2017  Check In: Session Check In - 09/05/17 1116      Check-In   Supervising physician immediately available to respond to emergencies  [facility] immediately available    Physician(s)  Dr. Herbert Moors    Location  [facility]    Staff Present  Su Hilt, MS, ACSM RCEP, Exercise Physiologist;Lisa Ysidro Evert, RN;Carlette Carlton, RN, BSN    Medication changes reported      No    Fall or balance concerns reported     No    Tobacco Cessation  No Change    Warm-up and Cool-down  Performed as group-led instruction    Resistance Training Performed  Yes    VAD Patient?  No    PAD/SET Patient?  No      Pain Assessment   Currently in Pain?  No/denies    Multiple Pain Sites  No       Capillary Blood Glucose: No results found for this or any previous visit (from the past 24 hour(s)).    Social History   Tobacco Use  Smoking Status Current Every Day Smoker  . Packs/day: 1.00  . Types: Cigarettes  . Last attempt to quit: 09/20/2016  . Years since quitting: 0.9  Smokeless Tobacco Never Used  Tobacco Comment   pt started back smoking 08/08/16    Goals Met:  Exercise tolerated well No report of cardiac concerns or symptoms Strength training completed today  Goals Unmet:  Not Applicable  Comments: Service time is from 10:30a to 12:30p    Dr. Rush Farmer is Medical Director for Pulmonary Rehab at [facility].

## 2017-09-05 NOTE — Telephone Encounter (Signed)
Spoke with the pharmacist at [facility]  We sent rx for only 15 ml albuterol sol  This is only 5 vials  I advised okay to dispense 1 mo supply which is 360 ml  Nothing further needed

## 2017-09-09 ENCOUNTER — Telehealth: Payer: Self-pay | Admitting: Internal Medicine

## 2017-09-09 DIAGNOSIS — J441 Chronic obstructive pulmonary disease with (acute) exacerbation: Secondary | ICD-10-CM

## 2017-09-09 NOTE — Telephone Encounter (Signed)
Attempted to call pt. I did not receive an answer. I have left a message for pt to return our call.  

## 2017-09-10 ENCOUNTER — Encounter (HOSPITAL_COMMUNITY): Payer: Medicare Other

## 2017-09-10 NOTE — Telephone Encounter (Signed)
Pt is returning call. Cb is [phone].

## 2017-09-10 NOTE — Progress Notes (Signed)
   HPI 72 year old who follows up for follow up for vision problems, skin rash, and mole on back.   Patient did well on the prednisone, her skin has improved greatly. She feels that her vision did improve while on the prednisone. It has since gone back to being "blurry" some of the time. After showing the patient several different images she feels that her blurring is consistent with what is usually found with astigmatism. She has been diagnosed with astigmatism in the past.  She also feels like she has a concerning mole on her back. There is a reddish colored skin finding that has recently become raised. She feels like it has also grown.  She has also set a quit date for mid august. She is trying to quite with nicotine supplementation.  CC: multiple issues   ROS:  Review of Systems See HPI for ROS.   CC, SH/smoking status, and VS noted  Objective: BP 120/60   Pulse 74   Temp 98.7 F (37.1 C) (Oral)   Ht 5\' 7"  (1.702 m)   Wt 215 lb (97.5 kg)   LMP 06/04/2011   SpO2 97%   BMI 33.67 kg/m  Gen: NAD, alert, cooperative, and pleasant HEENT: NCAT, EOMI, PERRL CV: RRR, no murmur Resp: CTAB, no wheezes, non-labored Abd: SNTND, BS present, no guarding or organomegaly Ext: No edema, warm Neuro: Alert and oriented, Speech clear, No gross deficits Back: Red, raised, circular lesion in lower mid-back. Well defined borders. Skin: greatly improved erythematous scaly plaques noted on bilateral upper extremities   Assessment and plan:  Rash and nonspecific skin eruption Greatly improved. Unknown if eczema related or if this was related to her lupus as this was mainly in sun-exposed areas. Return precautions given.  Nevus, non-neoplastic While this nevus is well defined, and has regular borders the fact that it has recently become raised and has changed color is a little concerning. Will bring patient back in a couple of weeks for a biopsy of this area.  Encounter for smoking  cessation counseling Encouraged patient and congratulated her on picking a quit date. Asked for her to follow up with me a few weeks after her quit date to see how things are going.  Astigmatism Her vision problems seem consistent with astigmatism. Asked patient to bring this up to her optometrist the next time she visits. Has been diagnosed with before but apparently it "went away".   No orders of the defined types were placed in this encounter.   Meds ordered this encounter  Medications  . furosemide (LASIX) 20 MG tablet    Sig: Take 1 tablet (20 mg total) by mouth daily.    Dispense:  30 tablet    Refill:  2     Guadalupe Dawn MD PGY-2 Family Medicine Resident  09/11/2017 12:03 AM

## 2017-09-10 NOTE — Telephone Encounter (Signed)
Called and spoke with pt in need of larger size in nebulizer mask Placed order today to [facility] Nothing further needed.

## 2017-09-11 ENCOUNTER — Encounter: Payer: Self-pay | Admitting: Family Medicine

## 2017-09-11 DIAGNOSIS — R21 Rash and other nonspecific skin eruption: Secondary | ICD-10-CM | POA: Insufficient documentation

## 2017-09-11 NOTE — Assessment & Plan Note (Signed)
While this nevus is well defined, and has regular borders the fact that it has recently become raised and has changed color is a little concerning. Will bring patient back in a couple of weeks for a biopsy of this area.

## 2017-09-11 NOTE — Assessment & Plan Note (Signed)
Encouraged patient and congratulated her on picking a quit date. Asked for her to follow up with me a few weeks after her quit date to see how things are going.

## 2017-09-11 NOTE — Assessment & Plan Note (Signed)
Greatly improved. Unknown if eczema related or if this was related to her lupus as this was mainly in sun-exposed areas. Return precautions given.

## 2017-09-11 NOTE — Assessment & Plan Note (Signed)
Her vision problems seem consistent with astigmatism. Asked patient to bring this up to her optometrist the next time she visits. Has been diagnosed with before but apparently it "went away".

## 2017-09-12 ENCOUNTER — Encounter (HOSPITAL_COMMUNITY): Payer: Medicare Other

## 2017-09-13 ENCOUNTER — Encounter: Payer: Self-pay | Admitting: Gastroenterology

## 2017-09-17 ENCOUNTER — Encounter (HOSPITAL_COMMUNITY)
Admission: RE | Admit: 2017-09-17 | Discharge: 2017-09-17 | Disposition: A | Discharge status: Home / Self Care | Payer: Medicare Other | Source: Ambulatory Visit | Attending: Internal Medicine | Admitting: Internal Medicine

## 2017-09-17 DIAGNOSIS — J449 Chronic obstructive pulmonary disease, unspecified: Secondary | ICD-10-CM | POA: Diagnosis not present

## 2017-09-17 NOTE — Progress Notes (Signed)
  Daily Session Note  Patient Details  Name: Breanna Dixon MRN: [id number] Date of Birth: Sep 12, 1945 Referring Provider:     Pulmonary Rehab Walk Test from 07/02/2017 in [place]  Referring Provider  Dr. Annamaria Boots      Encounter Date: 09/17/2017  Check In: Session Check In - 09/17/17 1141      Check-In   Supervising physician immediately available to respond to emergencies  [facility] immediately available    Physician(s)  Dr. Florene Glen    Location  [facility]    Staff Present  Su Hilt, MS, ACSM RCEP, Exercise Physiologist;Lisa Ysidro Evert, RN;Carlette Carlton, RN, BSN    Medication changes reported      No    Fall or balance concerns reported     No    Tobacco Cessation  No Change    Warm-up and Cool-down  Performed as group-led instruction    Resistance Training Performed  Yes    VAD Patient?  No    PAD/SET Patient?  No      Pain Assessment   Currently in Pain?  No/denies       Capillary Blood Glucose: No results found for this or any previous visit (from the past 24 hour(s)).    Social History   Tobacco Use  Smoking Status Current Every Day Smoker  . Packs/day: 1.00  . Types: Cigarettes  . Last attempt to quit: 09/20/2016  . Years since quitting: 0.9  Smokeless Tobacco Never Used  Tobacco Comment   pt started back smoking 08/08/16    Goals Met:  Achieving weight loss Personal goals reviewed Queuing for purse lip breathing  Goals Unmet:  Not Applicable  Comments: Service time is from 10:30a to 12:00p    Dr. Rush Farmer is Medical Director for Pulmonary Rehab at [facility].

## 2017-09-19 ENCOUNTER — Encounter (HOSPITAL_COMMUNITY)
Admission: RE | Admit: 2017-09-19 | Discharge: 2017-09-19 | Disposition: A | Discharge status: Home / Self Care | Payer: Medicare Other | Source: Ambulatory Visit | Attending: Internal Medicine | Admitting: Internal Medicine

## 2017-09-19 DIAGNOSIS — J449 Chronic obstructive pulmonary disease, unspecified: Secondary | ICD-10-CM

## 2017-09-19 NOTE — Progress Notes (Signed)
Daily Session Note  Patient Details  Name: Breanna Dixon MRN: [id number] Date of Birth: 11-11-1945 Referring Provider:     Pulmonary Rehab Walk Test from 07/02/2017 in [place]  Referring Provider  Dr. Annamaria Boots      Encounter Date: 09/19/2017  Check In: Session Check In - 09/19/17 1246      Check-In   Supervising physician immediately available to respond to emergencies  [facility] immediately available    Physician(s)   Dr. Florene Glen    Location  [facility]    Staff Present  Su Hilt, MS, ACSM RCEP, Exercise Physiologist;Carlette Wilber Oliphant, RN, BSN;Ramon Dredge, RN, MHA    Medication changes reported      No    Fall or balance concerns reported     No    Tobacco Cessation  No Change    Warm-up and Cool-down  Performed as group-led instruction    Resistance Training Performed  Yes    VAD Patient?  No    PAD/SET Patient?  No      Pain Assessment   Currently in Pain?  No/denies    Multiple Pain Sites  No       Capillary Blood Glucose: No results found for this or any previous visit (from the past 24 hour(s)).    Social History   Tobacco Use  Smoking Status Current Every Day Smoker  . Packs/day: 1.00  . Types: Cigarettes  . Last attempt to quit: 09/20/2016  . Years since quitting: 0.9  Smokeless Tobacco Never Used  Tobacco Comment   pt started back smoking 08/08/16    Goals Met:  Achieving weight loss Exercise tolerated well Personal goals reviewed  Goals Unmet:  Not Applicable  Comments: Service time is from 10:30a to 12:30p    Dr. Rush Farmer is Medical Director for Pulmonary Rehab at [facility].

## 2017-09-24 ENCOUNTER — Encounter (HOSPITAL_COMMUNITY)
Admission: RE | Admit: 2017-09-24 | Discharge: 2017-09-24 | Disposition: A | Discharge status: Home / Self Care | Payer: Medicare Other | Source: Ambulatory Visit | Attending: Internal Medicine | Admitting: Internal Medicine

## 2017-09-24 DIAGNOSIS — J449 Chronic obstructive pulmonary disease, unspecified: Secondary | ICD-10-CM
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
## 2017-09-24 NOTE — Progress Notes (Signed)
Daily Session Note  Patient Details  Name: Breanna Dixon MRN: [id number] Date of Birth: July 13, 1945 Referring Provider:     Pulmonary Rehab Walk Test from 07/02/2017 in [place]  Referring Provider  Dr. Annamaria Boots      Encounter Date: 09/24/2017  Check In: Session Check In - 09/24/17 1238      Check-In   Supervising physician immediately available to respond to emergencies  [facility] immediately available    Physician(s)   Dr. Florene Glen    Location  [facility]    Staff Present  Su Hilt, MS, ACSM RCEP, Exercise Physiologist;Carlette Wilber Oliphant, RN, BSN;Ramon Dredge, RN, MHA;Lisa Ysidro Evert, RN    Medication changes reported      No    Fall or balance concerns reported     No    Tobacco Cessation  No Change    Warm-up and Cool-down  Performed as group-led instruction    Resistance Training Performed  Yes    VAD Patient?  No    PAD/SET Patient?  No      Pain Assessment   Currently in Pain?  No/denies       Capillary Blood Glucose: No results found for this or any previous visit (from the past 24 hour(s)).  Exercise Prescription Changes - 09/24/17 1300      Response to Exercise   Blood Pressure (Admit)  102/62    Blood Pressure (Exercise)  140/68    Blood Pressure (Exit)  90/52    Heart Rate (Admit)  65 bpm    Heart Rate (Exercise)  72 bpm    Heart Rate (Exit)  69 bpm    Oxygen Saturation (Admit)  97 %    Oxygen Saturation (Exercise)  97 %    Oxygen Saturation (Exit)  96 %    Rating of Perceived Exertion (Exercise)  11    Perceived Dyspnea (Exercise)  1    Duration  Progress to 45 minutes of aerobic exercise without signs/symptoms of physical distress    Intensity  THRR unchanged      Progression   Progression  Continue to progress workloads to maintain intensity without signs/symptoms of physical distress.      Resistance Training   Training Prescription  Yes    Weight  orange bands    Reps  10-15    Time  10 Minutes      Interval Training   Interval Training  No      NuStep   Level  5    SPM  80    Minutes  17      Arm Ergometer   Level  4    Watts  10    Minutes  17      Track   Laps  12    Minutes  17       Social History   Tobacco Use  Smoking Status Current Every Day Smoker  . Packs/day: 1.00  . Types: Cigarettes  . Last attempt to quit: 09/20/2016  . Years since quitting: 1.0  Smokeless Tobacco Never Used  Tobacco Comment   pt started back smoking 08/08/16    Goals Met:  Exercise tolerated well Personal goals reviewed  Goals Unmet:  Not Applicable  Comments: Service time is from 10:30a to 12:30p    Dr. Rush Farmer is Medical Director for Pulmonary Rehab at [facility].

## 2017-09-24 NOTE — Progress Notes (Signed)
Pulmonary Individual Treatment Plan  Patient Details  Name: Breanna Dixon MRN: [id number] Date of Birth: December 07, 1945 Referring Provider:     Pulmonary Rehab Walk Test from 07/02/2017 in [facility]  Referring Provider  Dr. Annamaria Boots      Initial Encounter Date:    Pulmonary Rehab Walk Test from 07/02/2017 in [facility]  Date  07/02/17      Visit Diagnosis: Stage 3 severe COPD by GOLD classification ([place])  Patient's Home Medications on Admission:  No current facility-administered medications for this encounter.   Current Outpatient Medications:  .  albuterol (PROVENTIL) (2.5 MG/3ML) 0.083% nebulizer solution, Take 3 mLs (2.5 mg total) by nebulization every 6 (six) hours as needed for wheezing or shortness of breath., Disp: 15 mL, Rfl: 12 .  Brexpiprazole (REXULTI) 2 MG TABS, Take 2.5 mg by mouth daily. , Disp: , Rfl:  .  carbamazepine (TEGRETOL XR) 200 MG 12 hr tablet, Take 200-400 mg by mouth See admin instructions. Take 200 mg in the morning and 400 mg in the evening, Disp: , Rfl:  .  fenofibrate (TRICOR) 145 MG tablet, Take 1 tablet (145 mg total) by mouth daily., Disp: 60 tablet, Rfl: 2 .  Fluticasone-Umeclidin-Vilant (TRELEGY ELLIPTA) 100-62.5-25 MCG/INH AEPB, Inhale 1 puff into the lungs daily. Rise mouth after each use., Disp: 1 each, Rfl: 4 .  furosemide (LASIX) 20 MG tablet, Take 1 tablet (20 mg total) by mouth daily., Disp: 30 tablet, Rfl: 2 .  HYDROcodone-acetaminophen (NORCO) 7.5-325 MG tablet, Take 1 tablet by mouth 3 (three) times daily., Disp: , Rfl:  .  hydroxychloroquine (PLAQUENIL) 200 MG tablet, Take 1 tablet (200 mg total) by mouth 2 (two) times daily., Disp: 60 tablet, Rfl: 2 .  hydrOXYzine (VISTARIL) 25 MG capsule, Take 25 mg by mouth as needed., Disp: , Rfl:  .  Ipratropium-Albuterol (COMBIVENT RESPIMAT) 20-100 MCG/ACT AERS respimat, INHALE TWO PUFFS INTO LUNGS  EVERY 6 HOURS AS NEEDED FOR SHORTNESS OF  BREATH, Disp: 1 Inhaler, Rfl: 5 .  MYRBETRIQ 50 MG TB24 tablet, Take 50 mg by mouth daily. , Disp: , Rfl: 11 .  Nebulizers (COMPRESSOR/NEBULIZER) MISC, Dx COPD, use as needed, Disp: 1 each, Rfl: 0 .  pregabalin (LYRICA) 100 MG capsule, Take 1-2 capsules (100-200 mg total) by mouth 2 (two) times daily. 1 tab in the am and 2 tabs at night. (Patient taking differently: Take 200 mg by mouth 2 (two) times daily. ), Disp: 90 capsule, Rfl: 1 .  ranitidine (ZANTAC) 150 MG capsule, TAKE 1 CAPSULE BY MOUTH TWICE DAILY (Patient taking differently: Take 150 mg by mouth 2 (two) times daily. ), Disp: 90 capsule, Rfl: 3 .  verapamil (CALAN-SR) 120 MG CR tablet, Take 1 tablet (120 mg total) by mouth at bedtime., Disp: 30 tablet, Rfl: 2  Past Medical History: Past Medical History:  Diagnosis Date  . Anxiety    takes Ativan daily, Panic attack   . Arthritis   . Bleeding ulcer 1974  . Breast cancer ([place])    right breast 2000  . Cancer [facility])    Surgery only, Lung Cancer- surgery only  . COPD (chronic obstructive pulmonary disease) ([place])   . Depression   . Fibromyalgia    takes Lyrica daily  . Head injury with loss of consciousness ([place])   . Headache(784.0)    takes Tegretol and Verapamil nightly  . History of blood transfusion    no abnormal reaction noted  . History  of bronchitis   . History of colon polyps    benign  . History of kidney stones    several  . History of ulcer disease    pyloric   . Hyperlipidemia    takes Fenofibrate daily  . Hypertension    takes Toprol daily  . Hypertension   . Joint pain   . Lung mass April 2013   Right lower lobe lung mass  . Lupus ([place])    takes Cymbalta,Plaquenil  daily  . Pneumonia    hx of  . PTSD (post-traumatic stress disorder)    takes Cymbalta daily  . Pulmonary embolism ([place]) 2006   IVC filter  . Seasonal allergies    flonase prn  . Vertigo    takes Antivert prn    Tobacco Use: Social History   Tobacco Use  Smoking Status Current  Every Day Smoker  . Packs/day: 1.00  . Types: Cigarettes  . Last attempt to quit: 09/20/2016  . Years since quitting: 1.0  Smokeless Tobacco Never Used  Tobacco Comment   pt started back smoking 08/08/16    Labs: Recent Review Flowsheet Data    Labs for ITP Cardiac and Pulmonary Rehab Latest Ref Rng & Units 01/06/2012 07/13/2014 07/16/2014 09/26/2017 09/26/2017   PHART 7.350 - 7.450 7.395 7.398 7.324(L) - -   PCO2ART 35.0 - 45.0 mmHg 37.6 36.0 44.0 - -   HCO3 20.0 - 24.0 mEq/L 22.5 21.7 22.2 - -   TCO2 22 - 32 mmol/L 19.8 22.8 23._0 ACIDBASEDEF 0.0 - 2.0 mmol/L 1.5 2.3(H) 2.9(H) - -   O2SAT % 94.1 96.2 95.5 - -      Capillary Blood Glucose: Lab Results  Component Value Date   GLUCAP 69 (L) 09/26/2017   GLUCAP 129 (H) 09/26/2017   GLUCAP 187 (H) 09/27/2016   GLUCAP 128 (H) 07/19/2014   GLUCAP 95 07/18/2014     Pulmonary Assessment Scores: Pulmonary Assessment Scores    Row Name 07/04/17 1011 07/16/17 1010       ADL UCSD   ADL Phase  Entry  Entry    SOB Score total  -  63      CAT Score   CAT Score  -  12      mMRC Score   mMRC Score  1  -       Pulmonary Function Assessment:   Exercise Target Goals: Exercise Program Goal: Individual exercise prescription set using results from initial 6 min walk test and THRR while considering  patient's activity barriers and safety.   Exercise Prescription Goal: Initial exercise prescription builds to 30-45 minutes a day of aerobic activity, 2-3 days per week.  Home exercise guidelines will be given to patient during program as part of exercise prescription that the participant will acknowledge.  Activity Barriers & Risk Stratification: Activity Barriers & Cardiac Risk Stratification - 06/28/17 1103      Activity Barriers & Cardiac Risk Stratification   Activity Barriers  Left Hip Replacement;Shortness of Breath;Left Knee Replacement;Right Knee Replacement;History of Falls;Balance Concerns;Incisional Pain;Back  Problems;Arthritis;Fibromyalgia    Cardiac Risk Stratification  High       6 Minute Walk: 6 Minute Walk    Row Name 07/04/17 0907         6 Minute Walk   Phase  Initial     Distance  736 feet     Walk Time  6 minutes     # of Rest Breaks  - 20  seconds standing     MPH  1.39     METS  2.07     RPE  14     Perceived Dyspnea   2     Symptoms  Yes (comment)     Comments  used wheelchair after 2nd lap     Resting HR  73 bpm     Resting BP  98/58     Resting Oxygen Saturation   94 %     Exercise Oxygen Saturation  during 6 min walk  88 %     Max Ex. HR  91 bpm     Max Ex. BP  130/62       Interval HR   1 Minute HR  91     2 Minute HR  86     3 Minute HR  76     4 Minute HR  78     5 Minute HR  89     6 Minute HR  89     2 Minute Post HR  77     Interval Heart Rate?  Yes       Interval Oxygen   Interval Oxygen?  Yes     Baseline Oxygen Saturation %  94 %     1 Minute Oxygen Saturation %  92 %     1 Minute Liters of Oxygen  0 L     2 Minute Oxygen Saturation %  92 %     2 Minute Liters of Oxygen  0 L     3 Minute Oxygen Saturation %  93 %     3 Minute Liters of Oxygen  0 L     4 Minute Oxygen Saturation %  93 %     4 Minute Liters of Oxygen  0 L     5 Minute Oxygen Saturation %  88 %     5 Minute Liters of Oxygen  0 L     6 Minute Oxygen Saturation %  88 %     6 Minute Liters of Oxygen  0 L     2 Minute Post Oxygen Saturation %  95 %     2 Minute Post Liters of Oxygen  0 L        Oxygen Initial Assessment: Oxygen Initial Assessment - 07/04/17 0906      Initial 6 min Walk   Oxygen Used  None      Program Oxygen Prescription   Program Oxygen Prescription  None   did drop to 88% during 6MWT-will keep a close eye on her o2 sats with exercise      Oxygen Re-Evaluation: Oxygen Re-Evaluation    Row Name 07/09/17 0741 08/02/17 1003 08/26/17 0924 09/23/17 1358       Program Oxygen Prescription   Program Oxygen Prescription  None  None  None  None       Home Oxygen   Home Oxygen Device  None  None  None  None    Sleep Oxygen Prescription  None  None  None  None    Home Exercise Oxygen Prescription  None  None  None  None    Home at Rest Exercise Oxygen Prescription  None  None  None  None       Oxygen Discharge (Final Oxygen Re-Evaluation): Oxygen Re-Evaluation - 09/23/17 1358      Program Oxygen Prescription   Program Oxygen Prescription  None      Home Oxygen  Home Oxygen Device  None    Sleep Oxygen Prescription  None    Home Exercise Oxygen Prescription  None    Home at Rest Exercise Oxygen Prescription  None       Initial Exercise Prescription: Initial Exercise Prescription - 07/04/17 0900      Date of Initial Exercise RX and Referring Provider   Date  07/02/17    Referring Provider  Dr. Annamaria Boots      NuStep   Level  2    SPM  80    Minutes  17    METs  1.5      Arm Ergometer   Level  1    Watts  10    Minutes  17      Track   Laps  5    Minutes  17      Prescription Details   Frequency (times per week)  2    Duration  Progress to 45 minutes of aerobic exercise without signs/symptoms of physical distress      Intensity   THRR 40-80% of Max Heartrate  60-121    Ratings of Perceived Exertion  11-13    Perceived Dyspnea  0-4      Progression   Progression  Continue progressive overload as per policy without signs/symptoms or physical distress.      Resistance Training   Training Prescription  Yes    Weight  orange bands    Reps  10-15       Perform Capillary Blood Glucose checks as needed.  Exercise Prescription Changes:  Exercise Prescription Changes    Row Name 07/16/17 1300 07/30/17 1200 08/01/17 1137 08/27/17 1200 09/12/17 0600     Response to Exercise   Blood Pressure (Admit)  110/56  126/58  118/50  132/62  110/56   Blood Pressure (Exercise)  110/60  132/60  110/50  100/60  116/60   Blood Pressure (Exit)  100/60  112/58  90/50  102/60  100/50   Heart Rate (Admit)  74 bpm  73 bpm  54 bpm   71 bpm  74 bpm   Heart Rate (Exercise)  70 bpm  92 bpm  61 bpm  74 bpm  74 bpm   Heart Rate (Exit)  63 bpm  60 bpm  61 bpm  60 bpm  67 bpm   Oxygen Saturation (Admit)  97 %  95 %  98 %  98 %  95 %   Oxygen Saturation (Exercise)  95 %  94 %  98 %  97 %  96 %   Oxygen Saturation (Exit)  96 %  97 %  96 %  91 %  95 %   Rating of Perceived Exertion (Exercise)  _0 Perceived Dyspnea (Exercise)  2  0  _1 Duration  Progress to 45 minutes of aerobic exercise without signs/symptoms of physical distress  Progress to 45 minutes of aerobic exercise without signs/symptoms of physical distress  Progress to 45 minutes of aerobic exercise without signs/symptoms of physical distress  Progress to 45 minutes of aerobic exercise without signs/symptoms of physical distress  Progress to 45 minutes of aerobic exercise without signs/symptoms of physical distress   Intensity  - 40-80% HRR  THRR unchanged  THRR unchanged  THRR unchanged  THRR unchanged     Progression   Progression  -  Continue to progress workloads to maintain intensity  without signs/symptoms of physical distress.  Continue to progress workloads to maintain intensity without signs/symptoms of physical distress.  Continue to progress workloads to maintain intensity without signs/symptoms of physical distress.  Continue to progress workloads to maintain intensity without signs/symptoms of physical distress.     Resistance Training   Training Prescription  Yes  Yes  Yes  Yes  Yes   Weight  orange bands  orange bands  orange bands  orange bands  orange bands   Reps  10-15  10-15  10-15  10-15  10-15   Time  10 Minutes  10 Minutes  10 Minutes  10 Minutes  10 Minutes     Interval Training   Interval Training  No  No  No  No  No     NuStep   Level  _0 -   SPM  80  80  80  80  -   Minutes  17  51  34  17  -   METs  1.8  1.9  1.9  3  -     Arm Ergometer   Level  - consult with dietician  -  -  1  4   Watts  -  -  -  10   10   Minutes  -  -  -  [place]  5  -  -  3  6   Minutes  17  -  -  17  17   Row Name 09/24/17 1300             Response to Exercise   Blood Pressure (Admit)  102/62       Blood Pressure (Exercise)  140/68       Blood Pressure (Exit)  90/52       Heart Rate (Admit)  65 bpm       Heart Rate (Exercise)  72 bpm       Heart Rate (Exit)  69 bpm       Oxygen Saturation (Admit)  97 %       Oxygen Saturation (Exercise)  97 %       Oxygen Saturation (Exit)  96 %       Rating of Perceived Exertion (Exercise)  11       Perceived Dyspnea (Exercise)  1       Duration  Progress to 45 minutes of aerobic exercise without signs/symptoms of physical distress       Intensity  THRR unchanged         Progression   Progression  Continue to progress workloads to maintain intensity without signs/symptoms of physical distress.         Resistance Training   Training Prescription  Yes       Weight  orange bands       Reps  10-15       Time  10 Minutes         Interval Training   Interval Training  No         NuStep   Level  5       SPM  80       Minutes  17         Arm Ergometer   Level  4       Watts  10       Minutes  17  Track   Laps  12       Minutes  17          Exercise Comments:   Exercise Goals and Review:  Exercise Goals    Row Name 06/28/17 1108             Exercise Goals   Increase Physical Activity  Yes       Intervention  Provide advice, education, support and counseling about physical activity/exercise needs.;Develop an individualized exercise prescription for aerobic and resistive training based on initial evaluation findings, risk stratification, comorbidities and participant's personal goals.       Expected Outcomes  Short Term: Attend rehab on a regular basis to increase amount of physical activity.;Long Term: Add in home exercise to make exercise part of routine and to increase amount of physical activity.;Long Term: Exercising  regularly at least 3-5 days a week.       Increase Strength and Stamina  Yes       Intervention  Provide advice, education, support and counseling about physical activity/exercise needs.;Develop an individualized exercise prescription for aerobic and resistive training based on initial evaluation findings, risk stratification, comorbidities and participant's personal goals.       Expected Outcomes  Short Term: Increase workloads from initial exercise prescription for resistance, speed, and METs.;Short Term: Perform resistance training exercises routinely during rehab and add in resistance training at home;Long Term: Improve cardiorespiratory fitness, muscular endurance and strength as measured by increased METs and functional capacity (6MWT)       Able to understand and use rate of perceived exertion (RPE) scale  Yes       Intervention  Provide education and explanation on how to use RPE scale       Expected Outcomes  Short Term: Able to use RPE daily in rehab to express subjective intensity level;Long Term:  Able to use RPE to guide intensity level when exercising independently       Able to understand and use Dyspnea scale  Yes       Intervention  Provide education and explanation on how to use Dyspnea scale       Expected Outcomes  Short Term: Able to use Dyspnea scale daily in rehab to express subjective sense of shortness of breath during exertion;Long Term: Able to use Dyspnea scale to guide intensity level when exercising independently       Knowledge and understanding of Target Heart Rate Range (THRR)  Yes       Intervention  Provide education and explanation of THRR including how the numbers were predicted and where they are located for reference       Expected Outcomes  Short Term: Able to state/look up THRR;Long Term: Able to use THRR to govern intensity when exercising independently;Short Term: Able to use daily as guideline for intensity in rehab       Understanding of Exercise Prescription   Yes       Intervention  Provide education, explanation, and written materials on patient's individual exercise prescription       Expected Outcomes  Short Term: Able to explain program exercise prescription;Long Term: Able to explain home exercise prescription to exercise independently          Exercise Goals Re-Evaluation : Exercise Goals Re-Evaluation    Row Name 07/09/17 0741 08/02/17 1003 08/26/17 0925 09/23/17 1359       Exercise Goal Re-Evaluation   Exercise Goals Review  Increase Strength and Stamina;Able to understand and use Dyspnea  scale;Increase Physical Activity;Able to understand and use rate of perceived exertion (RPE) scale;Knowledge and understanding of Target Heart Rate Range (THRR);Understanding of Exercise Prescription  Increase Strength and Stamina;Able to understand and use Dyspnea scale;Increase Physical Activity;Able to understand and use rate of perceived exertion (RPE) scale;Knowledge and understanding of Target Heart Rate Range (THRR);Understanding of Exercise Prescription  Increase Strength and Stamina;Able to understand and use Dyspnea scale;Increase Physical Activity;Able to understand and use rate of perceived exertion (RPE) scale;Knowledge and understanding of Target Heart Rate Range (THRR);Understanding of Exercise Prescription  Increase Strength and Stamina;Able to understand and use Dyspnea scale;Increase Physical Activity;Able to understand and use rate of perceived exertion (RPE) scale;Knowledge and understanding of Target Heart Rate Range (THRR);Understanding of Exercise Prescription    Comments  Patient is anticipated to start her first day of exercise today (07/09/17)  Patient is struggling with hip pain. This is a major barrier for her. Progression has not been made. Met level places her in a low level. Is not walking on the track due to pain. Only non-weight bearing exercises  Patient is struggling with hip pain. This is a major barrier for her. Progression has  not been made. Met level places her in a low level. Is not walking on the track due to pain. Only non-weight bearing exercises  Patient is struggling with hip pain. This is a major barrier for her. Progression has not been made. Met level places her in a low level. Will cont to progress and encourage home exercise.    Expected Outcomes  Through exercise at rehab and at home, the patient will decrease shortness of breath with daily activities and feel confident in carrying out an exercise regime at home.   Through exercise at rehab and at home, the patient will decrease shortness of breath with daily activities and feel confident in carrying out an exercise regime at home.   Through exercise at rehab and at home, the patient will decrease shortness of breath with daily activities and feel confident in carrying out an exercise regime at home.   Through exercise at rehab and at home, the patient will decrease shortness of breath with daily activities and feel confident in carrying out an exercise regime at home.        Discharge Exercise Prescription (Final Exercise Prescription Changes): Exercise Prescription Changes - 09/24/17 1300      Response to Exercise   Blood Pressure (Admit)  102/62    Blood Pressure (Exercise)  140/68    Blood Pressure (Exit)  90/52    Heart Rate (Admit)  65 bpm    Heart Rate (Exercise)  72 bpm    Heart Rate (Exit)  69 bpm    Oxygen Saturation (Admit)  97 %    Oxygen Saturation (Exercise)  97 %    Oxygen Saturation (Exit)  96 %    Rating of Perceived Exertion (Exercise)  11    Perceived Dyspnea (Exercise)  1    Duration  Progress to 45 minutes of aerobic exercise without signs/symptoms of physical distress    Intensity  THRR unchanged      Progression   Progression  Continue to progress workloads to maintain intensity without signs/symptoms of physical distress.      Resistance Training   Training Prescription  Yes    Weight  orange bands    Reps  10-15    Time   10 Minutes      Interval Training   Interval Training  No  NuStep   Level  5    SPM  80    Minutes  17      Arm Ergometer   Level  4    Watts  10    Minutes  17      Track   Laps  12    Minutes  17       Nutrition:  Target Goals: Understanding of nutrition guidelines, daily intake of sodium '1500mg'$ , cholesterol '200mg'$ , calories 30% from fat and 7% or less from saturated fats, daily to have 5 or more servings of fruits and vegetables.  Biometrics:    Nutrition Therapy Plan and Nutrition Goals: Nutrition Therapy & Goals - 07/04/17 1359      Nutrition Therapy   Diet  General, healthful      Intervention Plan   Intervention  Prescribe, educate and counsel regarding individualized specific dietary modifications aiming towards targeted core components such as weight, hypertension, lipid management, diabetes, heart failure and other comorbidities.    Expected Outcomes  Short Term Goal: Understand basic principles of dietary content, such as calories, fat, sodium, cholesterol and nutrients.;Long Term Goal: Adherence to prescribed nutrition plan.       Nutrition Assessments: Nutrition Assessments - 07/04/17 1359      Rate Your Plate Scores   Pre Score  47       Nutrition Goals Re-Evaluation:   Nutrition Goals Discharge (Final Nutrition Goals Re-Evaluation):   Psychosocial: Target Goals: Acknowledge presence or absence of significant depression and/or stress, maximize coping skills, provide positive support system. Participant is able to verbalize types and ability to use techniques and skills needed for reducing stress and depression.  Initial Review & Psychosocial Screening: Initial Psych Review & Screening - 06/28/17 1053      Initial Review   Current issues with  Current Depression;Current Anxiety/Panic;Current Psychotropic Meds;Current Sleep Concerns;Current Stress Concerns    Source of Stress Concerns  Family;Financial;Transportation;Unable to participate  in former interests or hobbies;Unable to perform yard/household activities;Chronic Illness;Poor Coping Skills    Comments  Pt with multiple stressors- strained relationships with son and daughte.      Family Dynamics   Good Support System?  No    Strains  Intra-family strains    Comments  support of son who lives with her      Barriers   Psychosocial barriers to participate in program  The patient should benefit from training in stress management and relaxation.      Screening Interventions   Interventions  Encouraged to exercise;To provide support and resources with identified psychosocial needs    Expected Outcomes  Short Term goal: Utilizing psychosocial counselor, staff and physician to assist with identification of specific Stressors or current issues interfering with healing process. Setting desired goal for each stressor or current issue identified.;Long Term Goal: Stressors or current issues are controlled or eliminated.       Quality of Life Scores:  Scores of 19 and below usually indicate a poorer quality of life in these areas.  A difference of  2-3 points is a clinically meaningful difference.  A difference of 2-3 points in the total score of the Quality of Life Index has been associated with significant improvement in overall quality of life, self-image, physical symptoms, and general health in studies assessing change in quality of life.  PHQ-9: Recent Review Flowsheet Data    Depression screen [facility] 2/9 09/04/2017 08/14/2017 06/28/2017 06/06/2017 04/17/2017   Decreased Interest 0 0 1 0 0   Down,  Depressed, Hopeless 0 0 2 0 0   PHQ - 2 Score 0 0 3 0 0   Altered sleeping - - 1 - -   Tired, decreased energy - - 1 - -   Change in appetite - - 3 - -   Feeling bad or failure about yourself  - - 0 - -   Trouble concentrating - - 0 - -   Moving slowly or fidgety/restless - - 0 - -   Suicidal thoughts - - 0 - -   PHQ-9 Score - - 8 - -   Difficult doing work/chores - - Somewhat  difficult - -     Interpretation of Total Score  Total Score Depression Severity:  1-4 = Minimal depression, 5-9 = Mild depression, 10-14 = Moderate depression, 15-19 = Moderately severe depression, 20-27 = Severe depression   Psychosocial Evaluation and Intervention: Psychosocial Evaluation - 09/24/17 1804      Psychosocial Evaluation & Interventions   Interventions  Stress management education;Relaxation education    Comments  Pt sees a counselor weekly. Pt has strong faith base.    Expected Outcomes  Pt will use learned techniques to cope with stress in a positive and healthy means.  Pt will continue to have a positive outllook on life and her future.    Continue Psychosocial Services   Follow up required by staff       Psychosocial Re-Evaluation: Psychosocial Re-Evaluation    [place] Name 07/08/17 1711 08/05/17 1458 08/26/17 1622 09/24/17 1804       Psychosocial Re-Evaluation   Current issues with  Current Stress Concerns;Current Anxiety/Panic;Current Psychotropic Meds;Current Sleep Concerns;Current Depression  Current Stress Concerns;Current Anxiety/Panic;Current Psychotropic Meds;Current Sleep Concerns;Current Depression  Current Stress Concerns;Current Anxiety/Panic;Current Psychotropic Meds;Current Sleep Concerns;Current Depression  Current Stress Concerns;Current Anxiety/Panic;Current Psychotropic Meds;Current Sleep Concerns;Current Depression    Comments  Pt has multiple complex issues within her family and her present living situation.  pt desires to return to church on a regular and consistent basis.  Pt has multiple complex issues within her family and her present living situation.  pt desires to return to church on a regular and consistent basis.  Pt has multiple complex issues within her family and her present living situation.  pt desires to return to church on a regular and consistent basis.  Pt has multiple complex issues within her family and her present living situation.  pt  has returned to church on a regular and consistent basis.    Expected Outcomes  -  -  Pt will develop techniques to mangae her stressors and anxiety.  Recognize when she needs help.  Pt will develop techniques to [facility] her stressors and anxiety.  Recognize when she needs help. Use effective coping skills and resist the urge to return to smoking cigarettes    Interventions  Relaxation education;Stress management education;Encouraged to attend Pulmonary Rehabilitation for the exercise  Relaxation education;Stress management education;Encouraged to attend Pulmonary Rehabilitation for the exercise  Relaxation education;Stress management education;Encouraged to attend Pulmonary Rehabilitation for the exercise  Relaxation education;Stress management education;Encouraged to attend Pulmonary Rehabilitation for the exercise    Continue Psychosocial Services   -  - sees a counselor weekly  -  Follow up required by staff    Comments  Pt with multiple stressors- strained relationships with son and daughte.  Pt with multiple stressors- strained relationships with son and daughte.  Pt with multiple stressors- strained relationships with son and daughter.  -  Initial Review   Source of Stress Concerns  Family;Financial;Transportation;Unable to participate in former interests or hobbies;Unable to perform yard/household activities;Chronic Illness;Poor Coping Skills  Family;Financial;Transportation;Unable to participate in former interests or hobbies;Unable to perform yard/household activities;Chronic Illness;Poor Coping Skills  Family;Financial;Transportation;Unable to participate in former interests or hobbies;Unable to perform yard/household activities;Chronic Illness;Poor Coping Skills  -       Psychosocial Discharge (Final Psychosocial Re-Evaluation): Psychosocial Re-Evaluation - 09/24/17 1804      Psychosocial Re-Evaluation   Current issues with  Current Stress Concerns;Current Anxiety/Panic;Current  Psychotropic Meds;Current Sleep Concerns;Current Depression    Comments  Pt has multiple complex issues within her family and her present living situation.  pt has returned to church on a regular and consistent basis.    Expected Outcomes  Pt will develop techniques to mangae her stressors and anxiety.  Recognize when she needs help. Use effective coping skills and resist the urge to return to smoking cigarettes    Interventions  Relaxation education;Stress management education;Encouraged to attend Pulmonary Rehabilitation for the exercise    Continue Psychosocial Services   Follow up required by staff       Education: Education Goals: Education classes will be provided on a weekly basis, covering required topics. Participant will state understanding/return demonstration of topics presented.  Learning Barriers/Preferences: Learning Barriers/Preferences - 06/28/17 1102      Learning Barriers/Preferences   Learning Barriers  Sight;Hearing    Learning Preferences  Individual Instruction;Verbal Instruction       Education Topics: Risk Factor Reduction:  -Group instruction that is supported by a PowerPoint presentation. Instructor discusses the definition of a risk factor, different risk factors for pulmonary disease, and how the heart and lungs work together.     Nutrition for Pulmonary Patient:  -Group instruction provided by PowerPoint slides, verbal discussion, and written materials to support subject matter. The instructor gives an explanation and review of healthy diet recommendations, which includes a discussion on weight management, recommendations for fruit and vegetable consumption, as well as protein, fluid, caffeine, fiber, sodium, sugar, and alcohol. Tips for eating when patients are short of breath are discussed.   Pursed Lip Breathing:  -Group instruction that is supported by demonstration and informational handouts. Instructor discusses the benefits of pursed lip and  diaphragmatic breathing and detailed demonstration on how to preform both.     Oxygen Safety:  -Group instruction provided by PowerPoint, verbal discussion, and written material to support subject matter. There is an overview of "What is Oxygen" and "Why do we need it".  Instructor also reviews how to create a safe environment for oxygen use, the importance of using oxygen as prescribed, and the risks of noncompliance. There is a brief discussion on traveling with oxygen and resources the patient may utilize.   Oxygen Equipment:  -Group instruction provided by [facility] Staff utilizing handouts, written materials, and equipment demonstrations.   PULMONARY REHAB CHRONIC OBSTRUCTIVE PULMONARY DISEASE from 09/19/2017 in [place]  Date  08/29/17  Educator  Ace Gins  Instruction Review Code  2- Demonstrated Understanding      Signs and Symptoms:  -Group instruction provided by written material and verbal discussion to support subject matter. Warning signs and symptoms of infection, stroke, and heart attack are reviewed and when to call the physician/911 reinforced. Tips for preventing the spread of infection discussed.   Advanced Directives:  -Group instruction provided by verbal instruction and written material to support subject matter. Instructor reviews Advanced Directive laws and proper instruction  for filling out document.   Pulmonary Video:  -Group video education that reviews the importance of medication and oxygen compliance, exercise, good nutrition, pulmonary hygiene, and pursed lip and diaphragmatic breathing for the pulmonary patient.   Exercise for the Pulmonary Patient:  -Group instruction that is supported by a PowerPoint presentation. Instructor discusses benefits of exercise, core components of exercise, frequency, duration, and intensity of an exercise routine, importance of utilizing pulse oximetry during exercise, safety while exercising,  and options of places to exercise outside of rehab.     PULMONARY REHAB CHRONIC OBSTRUCTIVE PULMONARY DISEASE from 09/19/2017 in [place]  Date  09/05/17  Educator  Cloyde Reams  Instruction Review Code  1- Verbalizes Understanding      Pulmonary Medications:  -Verbally interactive group education provided by instructor with focus on inhaled medications and proper administration.   Anatomy and Physiology of the Respiratory System and Intimacy:  -Group instruction provided by PowerPoint, verbal discussion, and written material to support subject matter. Instructor reviews respiratory cycle and anatomical components of the respiratory system and their functions. Instructor also reviews differences in obstructive and restrictive respiratory diseases with examples of each. Intimacy, Sex, and Sexuality differences are reviewed with a discussion on how relationships can change when diagnosed with pulmonary disease. Common sexual concerns are reviewed.   MD DAY -A group question and answer session with a medical doctor that allows participants to ask questions that relate to their pulmonary disease state.   PULMONARY REHAB CHRONIC OBSTRUCTIVE PULMONARY DISEASE from 09/19/2017 in [place]  Date  07/11/17  Educator  Dr. Nelda Marseille  Instruction Review Code  1- Verbalizes Understanding      OTHER EDUCATION -Group or individual verbal, written, or video instructions that support the educational goals of the pulmonary rehab program.   PULMONARY REHAB CHRONIC OBSTRUCTIVE PULMONARY DISEASE from 09/19/2017 in [place]  Date  08/01/17  Educator  [place]  Instruction Review Code  1- Verbalizes Understanding      Holiday Eating Survival Tips:  -Group instruction provided by PowerPoint slides, verbal discussion, and written materials to support subject matter. The instructor  gives patients tips, tricks, and techniques to help them not only survive but enjoy the holidays despite the onslaught of food that accompanies the holidays.   Knowledge Questionnaire Score: Knowledge Questionnaire Score - 07/16/17 1010      Knowledge Questionnaire Score   Pre Score  15/18       Core Components/Risk Factors/Patient Goals at Admission: Personal Goals and Risk Factors at Admission - 06/28/17 1217      Core Components/Risk Factors/Patient Goals on Admission    Weight Management  Weight Loss;Obesity       Core Components/Risk Factors/Patient Goals Review:  Goals and Risk Factor Review    Row Name 07/08/17 1707 08/05/17 1457 08/26/17 1615 08/28/17 1203 09/24/17 1806     Core Components/Risk Factors/Patient Goals Review   Personal Goals Review  Weight Management/Obesity;Develop more efficient breathing techniques such as purse lipped breathing and diaphragmatic breathing and practicing self-pacing with activity.;Stress;Tobacco Cessation;Increase knowledge of respiratory medications and ability to use respiratory devices properly.;Hypertension;Lipids;Improve shortness of breath with ADL's  Weight Management/Obesity;Develop more efficient breathing techniques such as purse lipped breathing and diaphragmatic breathing and practicing self-pacing with activity.;Stress;Tobacco Cessation;Increase knowledge of respiratory medications and ability to use respiratory devices properly.;Hypertension;Lipids;Improve shortness of breath with ADL's  Weight Management/Obesity;Develop more efficient breathing techniques such as  purse lipped breathing and diaphragmatic breathing and practicing self-pacing with activity.;Stress;Tobacco Cessation;Increase knowledge of respiratory medications and ability to use respiratory devices properly.;Improve shortness of breath with ADL's  -  Weight Management/Obesity;Develop more efficient breathing techniques such as purse lipped breathing and diaphragmatic  breathing and practicing self-pacing with activity.;Stress;Tobacco Cessation;Increase knowledge of respiratory medications and ability to use respiratory devices properly.;Improve shortness of breath with ADL's   Review  pt will begin full exercise on 6/4.  Unable to assess goals at this time.  Will montior pt progress toward achieving these goals in the next 30 days.  having hip pain, scheduled for bone scan 08/07/17 to evaluate for prosthesis loosening, this has slowed her progression to goals due to chronic pain in hip  Pt bone scan to her hip was negative.  Pt will not need any additional intervention.  Hopefully pt will be able to increase her activity level as tolerated.  Pt continues to have multiple stressors with her family, finances and health, Pt is encouraged to practice PLB and diagphragmatic breathing  Pt does need cues and would like to see pt be more proactive with this.  Pt often missess education classess due to late arrival. on Thursdays.  Pt with resolutions of patient goals for HTN and lipids.  This is well managed and no longer applicable. Pt with weight loss of 1.9 kg since starting on 6/4. Pt has quit date for smoking which is on her birthday.  Pt birthday was [birth year] and she was absent due to needing to pick up food from the food bank.  Pt bone scan to her hip was negative.  Pt will not need any additional intervention.  Hopefully pt will be able to increase her activity level as tolerated.  Pt continues to have multiple stressors with her family, finances and health, Pt is encouraged to practice PLB and diagphragmatic breathing  Pt does need cues and would like to see pt be more proactive with this.  Pt often missess education classess due to late arrival on Thursdays. Worloads AC level 1, nustep level 4 and Track 8 laps. Pt with resolutions of patient goals for HTN and lipids.  This is well managed and no longer applicable. Pt with weight loss of 1.9 kg since starting on 6/4. Pt has quit date  for smoking which is on her birthday.  Pt birthday was [birth year] and she was absent due to needing to pick up food from the food bank.  Pt has completed 13 exercise sessions and has had more consistent attendance.  Pt quit smoking for 6 days but smoked one cigarette when whe was confronted by her roomate who is not mentally well.  Pt continues to have multiple stressors with her family, finances and health, Pt is encouraged to practice PLB and diagphragmatic breathing  Pt  has completed more education clasess because her consistent attendance. Breanna Dixon completes her face to face interviews with Dr. Nelda Marseille. Workloads AC increased level 4, increased nustep level 5 and Track all time high of 12 laps. Pt with weight loss of 1.0 ( put on weight when she quit smoking) kg since starting on 6/4. Continue to montior pt progress toward pulmonary goals. during the next 30 day assessment.   Expected Outcomes  see "admission outcomes"  see "admission outcomes"  See Admission Goals  See Admission Goals/Outcomes  See Admission Goals/Outcomes      Core Components/Risk Factors/Patient Goals at Discharge (Final Review):  Goals and Risk Factor Review - 09/24/17 1806  Core Components/Risk Factors/Patient Goals Review   Personal Goals Review  Weight Management/Obesity;Develop more efficient breathing techniques such as purse lipped breathing and diaphragmatic breathing and practicing self-pacing with activity.;Stress;Tobacco Cessation;Increase knowledge of respiratory medications and ability to use respiratory devices properly.;Improve shortness of breath with ADL's    Review  Pt has completed 13 exercise sessions and has had more consistent attendance.  Pt quit smoking for 6 days but smoked one cigarette when whe was confronted by her roomate who is not mentally well.  Pt continues to have multiple stressors with her family, finances and health, Pt is encouraged to practice PLB and diagphragmatic breathing  Pt  has completed  more education clasess because her consistent attendance. Breanna Dixon completes her face to face interviews with Dr. Nelda Marseille. Workloads AC increased level 4, increased nustep level 5 and Track all time high of 12 laps. Pt with weight loss of 1.0 ( put on weight when she quit smoking) kg since starting on 6/4. Continue to montior pt progress toward pulmonary goals. during the next 30 day assessment.    Expected Outcomes  See Admission Goals/Outcomes       ITP Comments: ITP Comments    Row Name 06/28/17 1014 07/10/17 1054 08/26/17 1614       ITP Comments  Dr. Jennet Maduro, Medical Director  Dr. Jennet Maduro, Medical Director  Dr. Jennet Maduro, Medical Director        Comments:  Pt has completed 13 exercise sessions since 07/09/17. Breanna Dixon, BSN Cardiac and Training and development officer

## 2017-09-26 ENCOUNTER — Encounter (HOSPITAL_COMMUNITY)
Admission: RE | Admit: 2017-09-26 | Discharge: 2017-09-26 | Disposition: A | Discharge status: Home / Self Care | Payer: Medicare Other | Source: Ambulatory Visit | Attending: Internal Medicine | Admitting: Internal Medicine

## 2017-09-26 ENCOUNTER — Emergency Department (HOSPITAL_COMMUNITY): Payer: Medicare Other

## 2017-09-26 ENCOUNTER — Encounter (HOSPITAL_COMMUNITY): Payer: Self-pay | Admitting: *Deleted

## 2017-09-26 ENCOUNTER — Other Ambulatory Visit: Payer: Self-pay

## 2017-09-26 ENCOUNTER — Emergency Department (HOSPITAL_COMMUNITY)
Admission: EM | Admit: 2017-09-26 | Discharge: 2017-09-26 | Disposition: A | Discharge status: Home / Self Care | Payer: Medicare Other | Attending: Emergency Medicine | Admitting: Emergency Medicine

## 2017-09-26 DIAGNOSIS — J449 Chronic obstructive pulmonary disease, unspecified: Secondary | ICD-10-CM | POA: Insufficient documentation

## 2017-09-26 DIAGNOSIS — G43909 Migraine, unspecified, not intractable, without status migrainosus: Secondary | ICD-10-CM | POA: Insufficient documentation

## 2017-09-26 DIAGNOSIS — I10 Essential (primary) hypertension: Secondary | ICD-10-CM | POA: Diagnosis not present

## 2017-09-26 DIAGNOSIS — Z96642 Presence of left artificial hip joint: Secondary | ICD-10-CM | POA: Diagnosis not present

## 2017-09-26 DIAGNOSIS — Z79899 Other long term (current) drug therapy: Secondary | ICD-10-CM | POA: Diagnosis not present

## 2017-09-26 DIAGNOSIS — F17210 Nicotine dependence, cigarettes, uncomplicated: Secondary | ICD-10-CM | POA: Diagnosis not present

## 2017-09-26 DIAGNOSIS — R479 Unspecified speech disturbances: Secondary | ICD-10-CM | POA: Diagnosis present

## 2017-09-26 DIAGNOSIS — Z96653 Presence of artificial knee joint, bilateral: Secondary | ICD-10-CM | POA: Diagnosis not present

## 2017-09-26 LAB — COMPREHENSIVE METABOLIC PANEL
ALT: 12 U/L (ref 0–44)
AST: 21 U/L (ref 15–41)
Albumin: 3.8 g/dL (ref 3.5–5.0)
Alkaline Phosphatase: 67 U/L (ref 38–126)
Anion gap: 4 — ABNORMAL LOW (ref 5–15)
BUN: 16 mg/dL (ref 8–23)
CHLORIDE: 111 mmol/L (ref 98–111)
CO2: 27 mmol/L (ref 22–32)
Calcium: 9 mg/dL (ref 8.9–10.3)
Creatinine, Ser: 0.7 mg/dL (ref 0.44–1.00)
GFR calc Af Amer: >60 mL/min (ref >60)
GFR calc non Af Amer: >60 mL/min (ref >60)
GLUCOSE: 98 mg/dL (ref 70–99)
POTASSIUM: 4.2 mmol/L (ref 3.5–5.1)
Sodium: 142 mmol/L (ref 135–145)
Total Bilirubin: 0.5 mg/dL (ref 0.3–1.2)
Total Protein: 6.4 g/dL — ABNORMAL LOW (ref 6.5–8.1)

## 2017-09-26 LAB — PROTIME-INR
INR: 1
PROTHROMBIN TIME: 13.1 s (ref 11.4–15.2)

## 2017-09-26 LAB — I-STAT CHEM 8, ED
BUN: 15 mg/dL (ref 8–23)
BUN: 16 mg/dL (ref 8–23)
CHLORIDE: 108 mmol/L (ref 98–111)
CHLORIDE: 108 mmol/L (ref 98–111)
CREATININE: 0.7 mg/dL (ref 0.44–1.00)
CREATININE: 0.7 mg/dL (ref 0.44–1.00)
Calcium, Ion: 1.19 mmol/L (ref 1.15–1.40)
Calcium, Ion: 1.19 mmol/L (ref 1.15–1.40)
Glucose, Bld: 88 mg/dL (ref 70–99)
Glucose, Bld: 90 mg/dL (ref 70–99)
HCT: 38 % (ref 36.0–46.0)
HEMATOCRIT: 38 % (ref 36.0–46.0)
HEMOGLOBIN: 12.9 g/dL (ref 12.0–15.0)
Hemoglobin: 12.9 g/dL (ref 12.0–15.0)
POTASSIUM: 4.2 mmol/L (ref 3.5–5.1)
POTASSIUM: 4.2 mmol/L (ref 3.5–5.1)
SODIUM: 142 mmol/L (ref 135–145)
Sodium: 142 mmol/L (ref 135–145)
TCO2: 24 mmol/L (ref 22–32)
TCO2: 25 mmol/L (ref 22–32)

## 2017-09-26 LAB — DIFFERENTIAL
ABS IMMATURE GRANULOCYTES: 0 10*3/uL (ref 0.0–0.1)
BASOS PCT: 1 %
Basophils Absolute: 0 10*3/uL (ref 0.0–0.1)
EOS ABS: 0.1 10*3/uL (ref 0.0–0.7)
Eosinophils Relative: 1 %
Immature Granulocytes: 1 %
LYMPHS ABS: 1.5 10*3/uL (ref 0.7–4.0)
Lymphocytes Relative: 21 %
MONOS PCT: 6 %
Monocytes Absolute: 0.5 10*3/uL (ref 0.1–1.0)
NEUTROS ABS: 5.2 10*3/uL (ref 1.7–7.7)
NEUTROS PCT: 70 %

## 2017-09-26 LAB — CBC
HCT: 41.2 % (ref 36.0–46.0)
Hemoglobin: 12.7 g/dL (ref 12.0–15.0)
MCH: 28 pg (ref 26.0–34.0)
MCHC: 30.8 g/dL (ref 30.0–36.0)
MCV: 90.9 fL (ref 78.0–100.0)
PLATELETS: 248 10*3/uL (ref 150–400)
RBC: 4.53 MIL/uL (ref 3.87–5.11)
RDW: 13.5 % (ref 11.5–15.5)
WBC: 7.3 10*3/uL (ref 4.0–10.5)

## 2017-09-26 LAB — I-STAT TROPONIN, ED: Troponin i, poc: 0.01 ng/mL (ref 0.00–0.08)

## 2017-09-26 LAB — APTT: aPTT: 28 seconds (ref 24–36)

## 2017-09-26 LAB — GLUCOSE, CAPILLARY: Glucose-Capillary: 129 mg/dL — ABNORMAL HIGH (ref 70–99)

## 2017-09-26 LAB — CBG MONITORING, ED: Glucose-Capillary: 69 mg/dL — ABNORMAL LOW (ref 70–99)

## 2017-09-26 MED ORDER — LORAZEPAM 2 MG/ML IJ SOLN
0.2500 mg | Freq: Once | INTRAMUSCULAR | Status: AC
  Administered 2017-09-26: 0.25 mg via INTRAVENOUS
  Filled 2017-09-26: qty 1

## 2017-09-26 MED ORDER — ASPIRIN EC 325 MG PO TBEC: 325.0000 mg | DELAYED_RELEASE_TABLET | Freq: Every day | ORAL | 0 refills | Status: DC

## 2017-09-26 MED ORDER — IOPAMIDOL (ISOVUE-300) INJECTION 61%
INTRAVENOUS | Status: AC
  Filled 2017-09-26: qty 50

## 2017-09-26 MED ORDER — IOPAMIDOL (ISOVUE-370) INJECTION 76%
50.0000 mL | Freq: Once | INTRAVENOUS | Status: AC | PRN
  Administered 2017-09-26: 50 mL via INTRAVENOUS

## 2017-09-26 MED ORDER — IOPAMIDOL (ISOVUE-370) INJECTION 76%
INTRAVENOUS | Status: AC
  Filled 2017-09-26: qty 50

## 2017-09-26 NOTE — ED Provider Notes (Signed)
[facility] DEPART[facility] Provider Note   CSN: [id number] Arrival date & time: 09/26/17  1052     History   Chief Complaint Chief Complaint  Patient presents with  . Stroke Symptoms    HPI Mckinleigh A Martinique is a 72 y.o. female.  The history is provided by the patient and medical records. No language interpreter was used.  Neurologic Problem  This is a recurrent problem. The current episode started yesterday (LKW at 4:40 PM). The problem occurs constantly. The problem has not changed (weakness unchabnges, speech and dizziness intermittnet) since onset.Pertinent negatives include no chest pain, no abdominal pain, no headaches and no shortness of breath. Nothing aggravates the symptoms. Nothing relieves the symptoms. She has tried nothing for the symptoms. The treatment provided no relief.    Past Medical History:  Diagnosis Date  . Anxiety    takes Ativan daily, Panic attack   . Arthritis   . Bleeding ulcer 1974  . Breast cancer ([place])    right breast 2000  . Cancer [facility])    Surgery only, Lung Cancer- surgery only  . COPD (chronic obstructive pulmonary disease) ([place])   . Depression   . Fibromyalgia    takes Lyrica daily  . Head injury with loss of consciousness ([place])   . Headache(784.0)    takes Tegretol and Verapamil nightly  . History of blood transfusion    no abnormal reaction noted  . History of bronchitis   . History of colon polyps    benign  . History of kidney stones    several  . History of ulcer disease    pyloric   . Hyperlipidemia    takes Fenofibrate daily  . Hypertension    takes Toprol daily  . Hypertension   . Joint pain   . Lung mass April 2013   Right lower lobe lung mass  . Lupus ([place])    takes Cymbalta,Plaquenil  daily  . Pneumonia    hx of  . PTSD (post-traumatic stress disorder)    takes Cymbalta daily  . Pulmonary embolism ([place]) 2006   IVC filter  . Seasonal allergies    flonase prn  . Vertigo    takes  Antivert prn    Patient Active Problem List   Diagnosis Date Noted  . Rash and nonspecific skin eruption 09/11/2017  . Astigmatism 09/04/2017  . Nevus, non-neoplastic 09/04/2017  . Lupus ([place]) 08/17/2017  . Fall 06/09/2017  . Irritable bowel syndrome with both constipation and diarrhea 06/09/2017  . Encounter for smoking cessation counseling 06/09/2017  . Healthcare maintenance 04/19/2017  . Hypoxia   . Snoring 01/10/2017  . Primary osteoarthritis of left hip 09/26/2016  . Osteoarthritis of left hip 09/21/2016  . Right-sided chest wall pain 11/04/2014  . S/P lobectomy of lung-carcinoid 2016 07/15/2014  . Granulomatous lung disease ([place]) 05/25/2012  . Breast mass, left UOQ 01/23/2012  . Acute respiratory failure with hypoxia ([place]) 01/05/2012  . COPD mixed type ([place]) 01/05/2012  . Tobacco abuse 01/05/2012  . Hypertension   . 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 ABDOMINAL HYSTERECTOMY  1992  . APPENDECTOMY  1965  . BACK SURGERY  2000/2010   fusion  . BREAST EXCISIONAL BIOPSY    . BREAST LUMPECTOMY    . BREAST SURGERY     bilateral partial masectomy  . CARDIAC CATHETERIZATION  1992   no PCI  . CHOLECYSTECTOMY  1994  .  COLONOSCOPY    . CYSTOSCOPY     multiple  . DILATION AND CURETTAGE OF UTERUS    . HERNIA REPAIR    . INCISIONAL HERNIA REPAIR    . IVC filter    . JOINT REPLACEMENT Bilateral    Knees  . KNEE ARTHROSCOPY Bilateral   . MULTIPLE TOOTH EXTRACTIONS  1990  . THORACOTOMY/LOBECTOMY Right 07/15/2014   Procedure: RIGHT THORACOTOMY/RIGHT LOWER LOBE  LOBECTOMY;  Surgeon: Gaye Pollack, MD;  Location: [facility] OR;  Service: Thoracic;  Laterality: Right;  . TONSILLECTOMY  1967  . TOTAL HIP ARTHROPLASTY Left 09/26/2016   Procedure: TOTAL HIP ARTHROPLASTY ANTERIOR APPROACH;  Surgeon: Frederik Pear, MD;  Location: [facility];  Service: Orthopedics;  Laterality: Left;  . TOTAL KNEE ARTHROPLASTY  06/04/2011   Procedure: TOTAL  KNEE ARTHROPLASTY;  Surgeon: Rudean Haskell, MD;  Location: [facility];  Service: Orthopedics;  Laterality: Right;  . TOTAL KNEE ARTHROPLASTY  10/15/2011   Procedure: TOTAL KNEE ARTHROPLASTY;  Surgeon: Rudean Haskell, MD;  Location: [facility];  Service: Orthopedics;  Laterality: Left;  . [place]  . VENA CAVA FILTER PLACEMENT  2006  . VIDEO BRONCHOSCOPY WITH ENDOBRONCHIAL NAVIGATION N/A 06/02/2014   Procedure: VIDEO BRONCHOSCOPY WITH ENDOBRONCHIAL NAVIGATION;  Surgeon: Collene Gobble, MD;  Location: [facility];  Service: Thoracic;  Laterality: N/A;     OB History   None      Home Medications    Prior to Admission medications   Medication Sig Start Date End Date Taking? Authorizing Provider  albuterol (PROVENTIL) (2.5 MG/3ML) 0.083% nebulizer solution Take 3 mLs (2.5 mg total) by nebulization every 6 (six) hours as needed for wheezing or shortness of breath. 09/02/17   Young, Clinton D, MD  Brexpiprazole (REXULTI) 2 MG TABS Take 2.5 mg by mouth daily.     [provider]  carbamazepine (TEGRETOL XR) 100 MG 12 hr tablet Take 1 tablet by mouth morning and take 2 tablets by mouth every night    [provider]  fenofibrate (TRICOR) 145 MG tablet Take 1 tablet (145 mg total) by mouth daily. 08/27/17   Guadalupe Dawn, MD  Fluticasone-Umeclidin-Vilant (TRELEGY ELLIPTA) 100-62.5-25 MCG/INH AEPB Inhale 1 puff into the lungs daily. Rise mouth after each use. 05/02/17   Young, Kasandra Knudsen, MD  furosemide (LASIX) 20 MG tablet Take 1 tablet (20 mg total) by mouth daily. 09/04/17   Guadalupe Dawn, MD  hydroxychloroquine (PLAQUENIL) 200 MG tablet Take 1 tablet (200 mg total) by mouth 2 (two) times daily. 08/14/17   Guadalupe Dawn, MD  Ipratropium-Albuterol (COMBIVENT RESPIMAT) 20-100 MCG/ACT AERS respimat INHALE TWO PUFFS INTO LUNGS  EVERY 6 HOURS AS NEEDED FOR SHORTNESS OF BREATH 06/26/17   Guadalupe Dawn, MD  MYRBETRIQ 50 MG TB24 tablet  04/25/17   [provider]  Nebulizers  (COMPRESSOR/NEBULIZER) MISC Dx COPD, use as needed 04/02/17   Patrecia Pour, Christean Grief, MD  pregabalin (LYRICA) 100 MG capsule Take 1-2 capsules (100-200 mg total) by mouth 2 (two) times daily. 1 tab in the am and 2 tabs at night. Patient taking differently: Take 200 mg by mouth 2 (two) times daily.  05/20/17   Guadalupe Dawn, MD  ranitidine (ZANTAC) 150 MG capsule TAKE 1 CAPSULE BY MOUTH TWICE DAILY 08/28/17   Guadalupe Dawn, MD  verapamil (CALAN-SR) 120 MG CR tablet Take 1 tablet (120 mg total) by mouth at bedtime. 08/14/17   Guadalupe Dawn, MD    Family History Family History  Adopted: Yes  Problem Relation Age of Onset  . Anesthesia problems Neg Hx   . Hypotension Neg Hx   . Malignant hyperthermia Neg Hx   . Pseudochol deficiency Neg Hx     Social History Social History   Tobacco Use  . Smoking status: Current Every Day Smoker    Packs/day: 1.00    Types: Cigarettes    Last attempt to quit: 09/20/2016    Years since quitting: 1.0  . Smokeless tobacco: Never Used  . Tobacco comment: pt started back smoking 08/08/16  Substance Use Topics  . Alcohol use: No    Alcohol/week: 0.0 standard drinks  . Drug use: No     Allergies   Imitrex [sumatriptan base]; Ketorolac tromethamine; Stadol [butorphanol tartrate]; Ivp dye [iodinated diagnostic agents]; and Shellfish allergy   Review of Systems Review of Systems  Constitutional: Negative for chills, diaphoresis, fatigue and fever.  HENT: Negative for congestion and rhinorrhea.   Eyes: Positive for visual disturbance (intermittnet for last month). Negative for photophobia.  Respiratory: Negative for cough, chest tightness, shortness of breath and wheezing.   Cardiovascular: Negative for chest pain and palpitations.  Gastrointestinal: Positive for nausea. Negative for abdominal pain, constipation, diarrhea and vomiting.  Genitourinary: Negative for dysuria and frequency.  Musculoskeletal: Negative for back pain, neck pain and neck  stiffness.  Skin: Negative for rash and wound.  Neurological: Positive for dizziness, speech difficulty, weakness and numbness. Negative for syncope, facial asymmetry, light-headedness and headaches.  Psychiatric/Behavioral: Negative for agitation.  All other systems reviewed and are negative.    Physical Exam Updated Vital Signs BP (!) 121/51 (BP Location: Right Arm)   Pulse 64   Temp 98.6 F (37 C) (Oral)   Resp 17   LMP 06/04/2011   SpO2 98%   Physical Exam  Constitutional: She is oriented to person, place, and time. She appears well-developed and well-nourished. No distress.  HENT:  Head: Normocephalic and atraumatic.  Mouth/Throat: Oropharynx is clear and moist. No oropharyngeal exudate.  Eyes: Pupils are equal, round, and reactive to light. Conjunctivae and EOM are normal.  Neck: Neck supple.  Cardiovascular: Normal rate and regular rhythm.  No murmur heard. Pulmonary/Chest: Effort normal and breath sounds normal. No respiratory distress. She has no wheezes. She has no rales. She exhibits no tenderness.  Abdominal: Soft. There is no tenderness.  Musculoskeletal: She exhibits no edema.  Neurological: She is alert and oriented to person, place, and time. A cranial nerve deficit and sensory deficit is present. She exhibits abnormal muscle tone. Coordination normal.  Left face and left arm numbness.  Left arm and left leg weakness compared to right.  Speech was clear with no aphasia or dysarthria.  Normal extraocular movements.  No facial droop.  Normal finger-nose-finger testing.    Skin: Skin is warm and dry. Capillary refill takes less than 2 seconds. No rash noted. She is not diaphoretic. No erythema.  Psychiatric: She has a normal mood and affect.  Nursing note and vitals reviewed.    ED Treatments / Results  Labs (all labs ordered are listed, but only abnormal results are displayed) Labs Reviewed  COMPREHENSIVE METABOLIC PANEL - Abnormal; Notable for the following  components:      Result Value   Total Protein 6.4 (*)    Anion gap 4 (*)    All other components within normal limits  CBG MONITORING, ED - Abnormal; Notable for the following components:   Glucose-Capillary 69 (*)    All other components within normal limits  PROTIME-INR  APTT  CBC  DIFFERENTIAL  I-STAT TROPONIN, ED  I-STAT CHEM 8, ED  I-STAT CHEM 8, ED    EKG EKG Interpretation  Date/Time:  Thursday September 26 2017 10:59:39 EDT Ventricular Rate:  59 PR Interval:  152 QRS Duration: 80 QT Interval:  452 QTC Calculation: 447 R Axis:   39 Text Interpretation:  Sinus bradycardia Otherwise normal ECG When compared to prior, slowe rate.  No STEMI Confirmed by Antony Blackbird [id number]) on 09/26/2017 11:36:34 AM   Radiology Ct Angio Head W Or Wo Contrast  Result Date: 09/26/2017 CLINICAL DATA:  72 year old female left side weakness and aphasia. EXAM: CT ANGIOGRAPHY HEAD AND NECK TECHNIQUE: Multidetector CT imaging of the head and neck was performed using the standard protocol during bolus administration of intravenous contrast. Multiplanar CT image reconstructions and MIPs were obtained to evaluate the vascular anatomy. Carotid stenosis measurements (when applicable) are obtained utilizing NASCET criteria, using the distal internal carotid diameter as the denominator. CONTRAST:  50 milliliters ISOVUE-370 IOPAMIDOL (ISOVUE-370) INJECTION 76% COMPARISON:  Head CT without contrast 1159 hours today. FINDINGS: CTA NECK Skeleton: Absent dentition. Mild for age degenerative changes in the cervical spine. No acute osseous abnormality identified. Upper chest: Postinflammatory small calcified subcarinal lymph node is partially visible. No superior mediastinal lymphadenopathy. Mild mosaic attenuation in the upper lungs. Other neck: Small hypodense thyroid nodules which do not meet size criteria for ultrasound follow-up. Otherwise negative. No neck mass or lymphadenopathy. Aortic arch: Mildly bovine type  arch configuration. Mild to moderate for age arch and great vessel origin soft and calcified atherosclerotic plaque. Right carotid system: No brachiocephalic or right CCA origin stenosis. Tortuous proximal right CCA. Soft and calcified plaque at the right carotid bifurcation, right ICA origin and bulb which have a partially retropharyngeal course. Subsequent proximal right ICA stenosis appears less than 50 % with respect to the distal vessel. Left carotid system: No left CCA origin stenosis. Mildly tortuous proximal left CCA. Mild plaque proximal to the left carotid bifurcation without stenosis. Calcified plaque in the proximal left ICA primarily at the distal bulb. Partially retropharyngeal course. Less than 50 % stenosis with respect to the distal vessel results. Vertebral arteries: Soft plaque and tortuosity at the origin of the right subclavian artery which has a kinked appearance (series 7, image 272). Normal right vertebral artery origin. The right vertebral artery appears non dominant and is patent to the skull base without stenosis. No proximal left subclavian artery stenosis despite some soft and calcified plaque. Normal left vertebral artery origin. Dominant left vertebral artery is patent to the skull base without stenosis. CTA HEAD Posterior circulation: No distal vertebral artery stenosis. The distal left vertebral is mildly dominant. Normal left PICA origin. Dominant appearing right AICA origin is patent. Patent basilar artery without stenosis. Normal SCA origins. Fetal type right PCA origin. The left P1 is tortuous. The left posterior communicating artery is diminutive. Bilateral PCA branches are within normal limits. Anterior circulation: Both ICA siphons are patent. Mild calcified plaque on the left without stenosis. On the right there is moderate calcified plaque resulting in up to 50% stenosis in the supraclinoid segment (series 8, image 81). Normal ophthalmic and posterior communicating artery  origins. Patent carotid termini. The left ACA A1 is dominant and the right is diminutive or absent. Normal left ACA origin, anterior communicating artery, and bilateral ACA branches. Left MCA M1 segment, bifurcation, and left MCA branches are within normal limits. Right MCA M1 segment is patent with mild calcified plaque  and irregularity but no high-grade stenosis. The right MCA bifurcation is patent without stenosis. Right MCA branches are within normal limits. Venous sinuses: Patent. Anatomic variants: Somewhat bovine type arch configuration. Dominant left vertebral artery. Fetal right PCA origin. Dominant left and diminutive or absent right ACA A1 segments. Delayed phase: No abnormal enhancement identified. Stable gray-white matter differentiation throughout the brain. No cortically based acute infarct identified. Review of the MIP images confirms the above findings IMPRESSION: 1. Negative for large vessel occlusion. 2. There is moderate Right ICA siphon stenosis in the supraclinoid segment due to calcified plaque, and mild Right MCA M1 calcified plaque and stenosis. No MCA branch occlusion identified. 3. Bilateral cervical ICA atherosclerosis but no other hemodynamically significant stenosis. Negative posterior circulation. 4. Stable CT appearance of the brain since 1159 hours today. Electronically Signed   By: Genevie Ann M.D.   On: 09/26/2017 13:47   Ct Head Wo Contrast  Result Date: 09/26/2017 CLINICAL DATA:  Left-sided weakness and aphasia EXAM: CT HEAD WITHOUT CONTRAST TECHNIQUE: Contiguous axial images were obtained from the base of the skull through the vertex without intravenous contrast. COMPARISON:  May 24, 2017 FINDINGS: Brain: Ventricles are normal in size and configuration. There is moderate frontal atrophy bilaterally. There is no intracranial mass, hemorrhage, extra-axial fluid collection, or midline shift. The gray-white compartments appear normal. No acute infarct evident. Vascular: No  hyperdense vessel. There is calcification in each carotid siphon region. Skull: The bony calvarium appears intact. Sinuses/Orbits: There is mucosal thickening in several ethmoid air cells bilaterally. Visualized paranasal sinuses elsewhere are clear. Orbits appear symmetric bilaterally. Other: Mastoid air cells are clear. IMPRESSION: Moderate frontal atrophy. Ventricles normal in size and configuration. Gray-white compartments appear normal. No mass or hemorrhage. There are foci of arterial vascular calcification. There is mucosal thickening in several ethmoid air cells. Electronically Signed   By: Lowella Grip III M.D.   On: 09/26/2017 13:03   Ct Angio Neck W Or Wo Contrast  Result Date: 09/26/2017 CLINICAL DATA:  72 year old female left side weakness and aphasia. EXAM: CT ANGIOGRAPHY HEAD AND NECK TECHNIQUE: Multidetector CT imaging of the head and neck was performed using the standard protocol during bolus administration of intravenous contrast. Multiplanar CT image reconstructions and MIPs were obtained to evaluate the vascular anatomy. Carotid stenosis measurements (when applicable) are obtained utilizing NASCET criteria, using the distal internal carotid diameter as the denominator. CONTRAST:  50 milliliters ISOVUE-370 IOPAMIDOL (ISOVUE-370) INJECTION 76% COMPARISON:  Head CT without contrast 1159 hours today. FINDINGS: CTA NECK Skeleton: Absent dentition. Mild for age degenerative changes in the cervical spine. No acute osseous abnormality identified. Upper chest: Postinflammatory small calcified subcarinal lymph node is partially visible. No superior mediastinal lymphadenopathy. Mild mosaic attenuation in the upper lungs. Other neck: Small hypodense thyroid nodules which do not meet size criteria for ultrasound follow-up. Otherwise negative. No neck mass or lymphadenopathy. Aortic arch: Mildly bovine type arch configuration. Mild to moderate for age arch and great vessel origin soft and calcified  atherosclerotic plaque. Right carotid system: No brachiocephalic or right CCA origin stenosis. Tortuous proximal right CCA. Soft and calcified plaque at the right carotid bifurcation, right ICA origin and bulb which have a partially retropharyngeal course. Subsequent proximal right ICA stenosis appears less than 50 % with respect to the distal vessel. Left carotid system: No left CCA origin stenosis. Mildly tortuous proximal left CCA. Mild plaque proximal to the left carotid bifurcation without stenosis. Calcified plaque in the proximal left ICA primarily at the distal  bulb. Partially retropharyngeal course. Less than 50 % stenosis with respect to the distal vessel results. Vertebral arteries: Soft plaque and tortuosity at the origin of the right subclavian artery which has a kinked appearance (series 7, image 272). Normal right vertebral artery origin. The right vertebral artery appears non dominant and is patent to the skull base without stenosis. No proximal left subclavian artery stenosis despite some soft and calcified plaque. Normal left vertebral artery origin. Dominant left vertebral artery is patent to the skull base without stenosis. CTA HEAD Posterior circulation: No distal vertebral artery stenosis. The distal left vertebral is mildly dominant. Normal left PICA origin. Dominant appearing right AICA origin is patent. Patent basilar artery without stenosis. Normal SCA origins. Fetal type right PCA origin. The left P1 is tortuous. The left posterior communicating artery is diminutive. Bilateral PCA branches are within normal limits. Anterior circulation: Both ICA siphons are patent. Mild calcified plaque on the left without stenosis. On the right there is moderate calcified plaque resulting in up to 50% stenosis in the supraclinoid segment (series 8, image 81). Normal ophthalmic and posterior communicating artery origins. Patent carotid termini. The left ACA A1 is dominant and the right is diminutive or  absent. Normal left ACA origin, anterior communicating artery, and bilateral ACA branches. Left MCA M1 segment, bifurcation, and left MCA branches are within normal limits. Right MCA M1 segment is patent with mild calcified plaque and irregularity but no high-grade stenosis. The right MCA bifurcation is patent without stenosis. Right MCA branches are within normal limits. Venous sinuses: Patent. Anatomic variants: Somewhat bovine type arch configuration. Dominant left vertebral artery. Fetal right PCA origin. Dominant left and diminutive or absent right ACA A1 segments. Delayed phase: No abnormal enhancement identified. Stable gray-white matter differentiation throughout the brain. No cortically based acute infarct identified. Review of the MIP images confirms the above findings IMPRESSION: 1. Negative for large vessel occlusion. 2. There is moderate Right ICA siphon stenosis in the supraclinoid segment due to calcified plaque, and mild Right MCA M1 calcified plaque and stenosis. No MCA branch occlusion identified. 3. Bilateral cervical ICA atherosclerosis but no other hemodynamically significant stenosis. Negative posterior circulation. 4. Stable CT appearance of the brain since 1159 hours today. Electronically Signed   By: Genevie Ann M.D.   On: 09/26/2017 13:47    Procedures Procedures (including critical care time)  Medications Ordered in ED Medications  iopamidol (ISOVUE-370) 76 % injection 50 mL (50 mLs Intravenous Contrast Given 09/26/17 1306)     Initial Impression / Assessment and Plan / ED Course  I have reviewed the triage vital signs and the nursing notes.  Pertinent labs & imaging results that were available during my care of the patient were reviewed by me and considered in my medical decision making (see chart for details).     Missey A Martinique is a 72 y.o. female with a past medical history significant for prior TIA, lung cancer status post lung surgery, prior pulmonary embolism,  hypertension, hyperlipidemia, COPD, lupus, and kidney stones who presents with neurologic deficits.  According to patient at 4:30 PM yesterday she started having left arm numbness and weakness.  She reports that today she went to go see her pulmonologist and was found to have a left-sided numbness, weakness, and some speech difficulties.  Patient was quickly brought to the ED for evaluation.  On my exam, patient was found to have left arm and left leg weakness.  Patient also had left arm and left facial numbness  but no left leg numbness.  Patient had dizziness sensation and was nauseous.  She reports that for the last month she has had intermittent diplopia but denies diplopia currently.  She reports this is been ongoing since a fall over a month ago.  She denies any headache today.  She denies any chest pain, shortness of breath, urinary symptoms or other GI symptoms.   11:44 AM  On my initial exam of the patient, she is Lucianne Lei negative as she is not having speech or vision abnormalities.  She does have a left-sided numbness and weakness.  Patient will not be a code stroke however we will get work-up for likely stroke versus TIA.  Anticipate speaking with neurology after work-up.  12:39 PM Neurology requested CTA of the head and neck given her intermittent diplopia, intermittent dizziness, and her intermittent speech difficulties.   Anticipate following up on neurology recommendations.  Initially, neurology recommended MRI.  Anticipate reassessment after MRI is completed.  Care transferred to Dr. Francia Greaves while awaiting MRI results.   Final Clinical Impressions(s) / ED Diagnoses   Final diagnoses:  Migraine without status migrainosus, not intractable, unspecified migraine type    ED Discharge Orders         Ordered    aspirin EC 325 MG tablet  Daily     09/26/17 1819          Clinical Impression: 1. Migraine without status migrainosus, not intractable, unspecified migraine type      Disposition: Care transferred to Dr. Francia Greaves while awaiting MRI results.   This note was prepared with assistance of Systems analyst. Occasional wrong-word or sound-a-like substitutions may have occurred due to the inherent limitations of voice recognition software.     Tegeler, Gwenyth Allegra, MD 09/26/17 2029

## 2017-09-26 NOTE — Consult Note (Addendum)
Neurology Consultation  Reason for Consult: Multiple symptoms including diplopia, headache, blurred vision, photophobia, phonophobia, throbbing headache Referring Physician: TEGELER   History is obtained from: Patient  HPI: Breanna Dixon is a 72 y.o. female with history of hypertension, hyperlipidemia, fibromyalgia, PTSD, anxiety, vertigo and depression.  Patient is a very poor historian.  Patient states that she initially went to see Nelda Marseille for pulmonary rehab.  Patient then at the end of session was complaining of generalized weakness and not feeling right that she was sent to the ED.  Upon entering the room, I asked the patient why she came here, patient then went into a long story of how she had fallen 3 years ago, had knee replacements and continued to have divergence in her conversation.  When I finally got patient to focus on why she came to the hospital today, patient states that she "just did not feel right" , yesterday she noted a lancinating pain "just below the lower border of her scapula".  She then stated that she was "diaphoretic".  I then asked patient if she was having a headache.  She stated that she has a long history of migraine headaches.  She then described that she had a throbbing pain behind her left eye with photophobia, phonophobia, with a 8 out of 10 pain.  I then gave her my pain scale of 0 being no pain and 10 being cutting off her arm with no anesthetic, she again says it was 8 out of 10 pain while she was watching TV with the lights on.  She did not describe any localizing or lateralizing pain or numbness.   ROS: A 14 point ROS was performed and is negative except as noted in the HPI.    Past Medical History:  Diagnosis Date  . Anxiety    takes Ativan daily, Panic attack   . Arthritis   . Bleeding ulcer 1974  . Breast cancer ([place])    right breast 2000  . Cancer [facility])    Surgery only, Lung Cancer- surgery only  . COPD (chronic obstructive pulmonary disease)  ([place])   . Depression   . Fibromyalgia    takes Lyrica daily  . Head injury with loss of consciousness ([place])   . Headache(784.0)    takes Tegretol and Verapamil nightly  . History of blood transfusion    no abnormal reaction noted  . History of bronchitis   . History of colon polyps    benign  . History of kidney stones    several  . History of ulcer disease    pyloric   . Hyperlipidemia    takes Fenofibrate daily  . Hypertension    takes Toprol daily  . Hypertension   . Joint pain   . Lung mass April 2013   Right lower lobe lung mass  . Lupus ([place])    takes Cymbalta,Plaquenil  daily  . Pneumonia    hx of  . PTSD (post-traumatic stress disorder)    takes Cymbalta daily  . Pulmonary embolism ([place]) 2006   IVC filter  . Seasonal allergies    flonase prn  . Vertigo    takes Antivert prn    Family History  Adopted: Yes  Problem Relation Age of Onset  . Anesthesia problems Neg Hx   . Hypotension Neg Hx   . Malignant hyperthermia Neg Hx   . Pseudochol deficiency Neg Hx      Social History:   reports that she has been smoking  cigarettes. She has been smoking about 1.00 pack per day. She has never used smokeless tobacco. She reports that she does not drink alcohol or use drugs.  Medications No current facility-administered medications for this encounter.   Current Outpatient Medications:  .  albuterol (PROVENTIL) (2.5 MG/3ML) 0.083% nebulizer solution, Take 3 mLs (2.5 mg total) by nebulization every 6 (six) hours as needed for wheezing or shortness of breath., Disp: 15 mL, Rfl: 12 .  Brexpiprazole (REXULTI) 2 MG TABS, Take 2.5 mg by mouth daily. , Disp: , Rfl:  .  carbamazepine (TEGRETOL XR) 200 MG 12 hr tablet, Take 200-400 mg by mouth See admin instructions. Take 200 mg in the morning and 400 mg in the evening, Disp: , Rfl:  .  fenofibrate (TRICOR) 145 MG tablet, Take 1 tablet (145 mg total) by mouth daily., Disp: 60 tablet, Rfl: 2 .  Fluticasone-Umeclidin-Vilant  (TRELEGY ELLIPTA) 100-62.5-25 MCG/INH AEPB, Inhale 1 puff into the lungs daily. Rise mouth after each use., Disp: 1 each, Rfl: 4 .  furosemide (LASIX) 20 MG tablet, Take 1 tablet (20 mg total) by mouth daily., Disp: 30 tablet, Rfl: 2 .  HYDROcodone-acetaminophen (NORCO) 7.5-325 MG tablet, Take 1 tablet by mouth 3 (three) times daily., Disp: , Rfl:  .  hydroxychloroquine (PLAQUENIL) 200 MG tablet, Take 1 tablet (200 mg total) by mouth 2 (two) times daily., Disp: 60 tablet, Rfl: 2 .  hydrOXYzine (VISTARIL) 25 MG capsule, Take 25 mg by mouth as needed., Disp: , Rfl:  .  Ipratropium-Albuterol (COMBIVENT RESPIMAT) 20-100 MCG/ACT AERS respimat, INHALE TWO PUFFS INTO LUNGS  EVERY 6 HOURS AS NEEDED FOR SHORTNESS OF BREATH, Disp: 1 Inhaler, Rfl: 5 .  MYRBETRIQ 50 MG TB24 tablet, Take 50 mg by mouth daily. , Disp: , Rfl: 11 .  pregabalin (LYRICA) 100 MG capsule, Take 1-2 capsules (100-200 mg total) by mouth 2 (two) times daily. 1 tab in the am and 2 tabs at night. (Patient taking differently: Take 200 mg by mouth 2 (two) times daily. ), Disp: 90 capsule, Rfl: 1 .  ranitidine (ZANTAC) 150 MG capsule, TAKE 1 CAPSULE BY MOUTH TWICE DAILY (Patient taking differently: Take 150 mg by mouth 2 (two) times daily. ), Disp: 90 capsule, Rfl: 3 .  verapamil (CALAN-SR) 120 MG CR tablet, Take 1 tablet (120 mg total) by mouth at bedtime., Disp: 30 tablet, Rfl: 2 .  Nebulizers (COMPRESSOR/NEBULIZER) MISC, Dx COPD, use as needed, Disp: 1 each, Rfl: 0   Exam: Current vital signs: BP 116/64   Pulse 66   Temp 98.6 F (37 C)   Resp 15   Ht 5' 7.5" (1.715 m)   Wt 97.1 kg   LMP 06/04/2011   SpO2 100%   BMI 33.02 kg/m  Vital signs in last 24 hours: Temp:  [98.6 F (37 C)] 98.6 F (37 C) (08/22 1230) Pulse Rate:  [57-66] 66 (08/22 1315) Resp:  [12-20] 15 (08/22 1315) BP: (101-121)/(50-66) 116/64 (08/22 1315) SpO2:  [98 %-100 %] 100 % (08/22 1315) Weight:  [97.1 kg] 97.1 kg (08/22 1210)  GENERAL: Awake, alert in  NAD HEENT: - Normocephalic and atraumatic,  Ext: warm, well perfused, intact peripheral pulses,  NEURO:  Mental Status: AA&Ox3, speech is clear.  Naming, repetition, fluency, and comprehension intact. Cranial Nerves: PERRL 2 mm/brisk. EOMI, visual fields full, no facial asymmetry, facial sensation intact, hearing intact, tongue/uvula/soft palate midline, normal Motor: 5/5 throughout Tone: is normal and bulk is normal Sensation- Intact to light touch bilaterally Coordination: FTN intact  bilaterally with endpoint tremor, no ataxia in BLE. Gait- deferred    Labs I have reviewed labs in epic and the results pertinent to this consultation are:   CBC    Component Value Date/Time   WBC 7.3 09/26/2017 1121   RBC 4.53 09/26/2017 1121   HGB 12.9 09/26/2017 1201   HCT 38.0 09/26/2017 1201   PLT 248 09/26/2017 1121   MCV 90.9 09/26/2017 1121   MCH 28.0 09/26/2017 1121   MCHC 30.8 09/26/2017 1121   RDW 13.5 09/26/2017 1121   LYMPHSABS 1.5 09/26/2017 1121   MONOABS 0.5 09/26/2017 1121   EOSABS 0.1 09/26/2017 1121   BASOSABS 0.0 09/26/2017 1121    CMP     Component Value Date/Time   NA 142 09/26/2017 1201   K 4.2 09/26/2017 1201   CL 108 09/26/2017 1201   CO2 27 09/26/2017 1121   GLUCOSE 88 09/26/2017 1201   BUN 16 09/26/2017 1201   CREATININE 0.70 09/26/2017 1201   CALCIUM 9.0 09/26/2017 1121   PROT 6.4 (L) 09/26/2017 1121   ALBUMIN 3.8 09/26/2017 1121   AST 21 09/26/2017 1121   ALT 12 09/26/2017 1121   ALKPHOS 67 09/26/2017 1121   BILITOT 0.5 09/26/2017 1121   GFRNONAA >60 09/26/2017 1121   GFRAA >60 09/26/2017 1121    Lipid Panel  No results found for: CHOL, TRIG, HDL, CHOLHDL, VLDL, LDLCALC, LDLDIRECT   Imaging I have reviewed the images obtained:  CT-scan of the brain--moderate frontal atrophy.  Ventricles normal size and configuration.  Gray-white matter Zane Herald appears normal no mass or hemorrhage.  No acute intracranial abnormalities.  CTA of head  neck--negative for large vessel occlusion.  There is moderate right ICA siphon stenosis in the supraclinoid segment due to calcification.  And mild right MCA M1 calcified plaque and stenosis.  However there is no MCA branch occlusion.  Bilateral cervical ICA atherosclerosis but not hemodynamically significant.   Assessment: Given patient's symptoms of throbbing pain behind her left eye with photophobia, phonophobia, nausea and prefers the lights to be off in the context of a history of migraine headaches, we believe that she is most likely suffering from a migraine headache.  Exam did not show any localizing/lateralizing abnormalities.  Recommendations: -Patient is not on aspirin thus I would initiate aspirin 325 daily. - Migraine cocktail --Patient does states she was on Tegretol in the past for her headaches however has not taken it for a few weeks.  At this point time she should follow-up with her outpatient physician to see if she needs to restart that. - Given patient's intracranial stenoses on CTA, including right ICA siphon and right MCA M1 stenosis in the context of being left-handed, may consider MRI of head. If negative no further emergent work-up indicated from a neurological standpoint  I have seen and examined the patient. I have amended the assessment and recommendations above.  Electronically signed: Dr. Kerney Elbe

## 2017-09-26 NOTE — Progress Notes (Signed)
Breanna Dixon reported for the [facility] Pulmonary program today. She had her face to face interview with Dr.Yacoub. After that Dr. Nelda Marseille stated that she needed to go to the ED that he was concerned that she had reported neurology symptoms. BP 126/60, HR 73, and O2 saturation 98%. Breanna Dixon was transported in stable condition to the ED via wheelchair.

## 2017-09-26 NOTE — Progress Notes (Signed)
Breanna Dixon presents to pulmonary rehab for her bi-weekly exercise session. I have completed her thirty day face to face review and determined that Breanna Dixon is on track for meeting their pulmonary rehab goals. There are barriers identified that will prevent them from continuing their exercise in pulmonary rehab as prescribed.  Patient is complaining for weakness on the left.  Will send to the ED.  Rush Farmer, M.D. [facility] Pulmonary/Critical Care Medicine. Pager: [phone]. After hours pager: [phone].

## 2017-09-26 NOTE — ED Triage Notes (Signed)
Pt coming from MD office with complaint of neuro changes. She has been having left side weakness, aphasia that started around 1630 yesterday. Pt alert and oriented, does have some difficulty word finding.

## 2017-09-26 NOTE — Discharge Instructions (Signed)
Please return for any problem.  Follow-up with Dr. Kris Mouton tomorrow as previously arranged.

## 2017-09-26 NOTE — ED Notes (Signed)
EDP at bedside  

## 2017-09-26 NOTE — ED Provider Notes (Signed)
Patient seen after sign out.  MRI Breanna Dixon is without evidence of acute pathology.  Case again discussed with Dr. Cheral Marker of neurology prior to discharge - patient is safe to discharge home.  She has established follow-up tomorrow with her primary care physician Dr. Kris Mouton.  Patient desires discharge home.  She understands the need for close follow-up.  Strict return precautions are given and understood.   Valarie Merino, MD 09/26/17 Tresa Moore

## 2017-09-26 NOTE — ED Notes (Signed)
Patient transported to MRI 

## 2017-09-26 NOTE — ED Notes (Signed)
D/c reviewed with patient. No further questions at this time

## 2017-09-27 ENCOUNTER — Ambulatory Visit (INDEPENDENT_AMBULATORY_CARE_PROVIDER_SITE_OTHER): Payer: Medicare Other | Admitting: Family Medicine

## 2017-09-27 VITALS — BP 115/65 | HR 68 | Temp 98.6°F | Wt 215.4 lb

## 2017-09-27 DIAGNOSIS — B49 Unspecified mycosis: Secondary | ICD-10-CM | POA: Diagnosis not present

## 2017-09-27 DIAGNOSIS — Q825 Congenital non-neoplastic nevus: Secondary | ICD-10-CM | POA: Diagnosis present

## 2017-09-27 DIAGNOSIS — G43809 Other migraine, not intractable, without status migrainosus: Secondary | ICD-10-CM

## 2017-09-27 MED ORDER — CARBAMAZEPINE ER 200 MG PO TB12: 200.0000 mg | ORAL_TABLET | ORAL | 1 refills | Status: DC

## 2017-09-27 NOTE — Patient Instructions (Addendum)
It was great seeing you today! We did a shave biopsy of that spot on your back. I will call you when the pathology results come back. I refilled your tegretol. I think that your naval area is looking good. Please call to schedule an appointment to have your labial area looked at.  It is ok to shower with that wound, I would not get into any bodies of water. Change the dressing two times per day for the next 3 days. After that just normal skin care.

## 2017-09-30 ENCOUNTER — Encounter: Payer: Self-pay | Admitting: Family Medicine

## 2017-09-30 DIAGNOSIS — Q825 Congenital non-neoplastic nevus: Secondary | ICD-10-CM | POA: Insufficient documentation

## 2017-09-30 DIAGNOSIS — B49 Unspecified mycosis: Secondary | ICD-10-CM | POA: Insufficient documentation

## 2017-09-30 DIAGNOSIS — G43909 Migraine, unspecified, not intractable, without status migrainosus: Secondary | ICD-10-CM | POA: Insufficient documentation

## 2017-09-30 NOTE — Assessment & Plan Note (Signed)
Resolved with the nystatin. No sequelae seen on exam.

## 2017-09-30 NOTE — Assessment & Plan Note (Addendum)
Shave biopsy performed. Will follow up derm path. See attached procedure note.

## 2017-09-30 NOTE — Progress Notes (Signed)
Family Medicine Procedure Note  Procedure: Mid-back Shave Biopsy Supervising Physician: Dr. Sherren Mocha McDiarmid Performing Resident: Dr. 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 Alcohol was applied and approximately 54mL 1% lidocaine with epi was used to numb the area. When the area was sufficiently anaesthetized, Iodine was applied to the area x2. A dermablade was used to remove the area along with the proper depth of skin. Silver nitrate used to achieve hemostasis. A 4x4 was placed over the skin along with paper tape.  Guadalupe Dawn MD PGY-2 Family Medicine Resident

## 2017-09-30 NOTE — Progress Notes (Signed)
   HPI 72 year old who presents for ED follow up and shave biopsy of midback lesion.  Patient seen in ed on 8/22 for blurry vision, headaches. She was evaluated for a code stroke but there were no findings on MRI or CT scan. She was diagnosed with migraines. Given a cocktail. Neurology recommended restarting her home tegretol. Patient says that she thinks her pharmacy was sending the refill requests for this medication to the wrong clinic. Regardless she does think that the tegretol helped these symptoms quite a bit when she was taking it.  She also feels like she has a concerning mole on her back. There is a reddish colored skin finding that has recently become raised. She feels like it has also grown.  She also had developed an itchy, erythematous area in her umbilicus. This started about two weeks prior to her clinic visit. She states that she used some available nystatin and the erythema has since reduced and it is feeling much better.   CC: migraines, "mole on back"   ROS:   Review of Systems See HPI for ROS.   CC, SH/smoking status, and VS noted  Objective: BP 115/65   Pulse 68   Temp 98.6 F (37 C) (Oral)   Wt 215 lb 6.4 oz (97.7 kg)   LMP 06/04/2011   SpO2 98%   BMI 33.24 kg/m  Gen: NAD, alert, cooperative, and pleasant CV: RRR, no murmur Resp: CTAB, no wheezes, non-labored Abd: SNTND, BS present, no guarding or organomegaly Ext: No edema, warm Neuro: Alert and oriented, Speech clear, No gross deficits Back: Red, raised, circular lesion in lower mid-back. Well defined borders.  Assessment and plan:  Nevus simplex Shave biopsy performed. Will follow up derm path. See attached procedure note.  Migraine Seems to have previously been well controlled with the tegretol. Will refill this medication.  Fungal infection Resolved with the nystatin. No sequelae seen on exam.   No orders of the defined types were placed in this encounter.   Meds ordered this encounter    Medications  . carbamazepine (TEGRETOL XR) 200 MG 12 hr tablet    Sig: Take 1-2 tablets (200-400 mg total) by mouth See admin instructions. Take 200 mg in the morning and 400 mg in the evening    Dispense:  30 tablet    Refill:  1     Guadalupe Dawn MD PGY-2 Family Medicine Resident  09/30/2017 10:34 AM

## 2017-09-30 NOTE — Assessment & Plan Note (Signed)
Seems to have previously been well controlled with the tegretol. Will refill this medication.

## 2017-10-01 ENCOUNTER — Encounter (HOSPITAL_COMMUNITY): Payer: Medicare Other

## 2017-10-03 ENCOUNTER — Encounter (HOSPITAL_COMMUNITY): Payer: Medicare Other

## 2017-10-08 ENCOUNTER — Encounter (HOSPITAL_COMMUNITY)
Admission: RE | Admit: 2017-10-08 | Discharge: 2017-10-08 | Disposition: A | Discharge status: Home / Self Care | Payer: Medicare Other | Source: Ambulatory Visit | Attending: Internal Medicine | Admitting: Internal Medicine

## 2017-10-08 VITALS — Wt 214.7 lb

## 2017-10-08 DIAGNOSIS — J449 Chronic obstructive pulmonary disease, unspecified: Secondary | ICD-10-CM | POA: Diagnosis not present

## 2017-10-08 NOTE — Progress Notes (Signed)
Daily Session Note  Patient Details  Name: Arwyn A Martinique MRN: [id number] Date of Birth: 1945-10-09 Referring Provider:     Pulmonary Rehab Walk Test from 07/02/2017 in [place]  Referring Provider  Dr. Annamaria Boots      Encounter Date: 10/08/2017  Check In: Session Check In - 10/08/17 1220      Check-In   Supervising physician immediately available to respond to emergencies  [facility] immediately available    Physician(s)  Dr. Karleen Hampshire    Location  [facility]    Staff Present  Su Hilt, MS, ACSM RCEP, Exercise Physiologist;Carlette Wilber Oliphant, RN, BSN;Ramon Dredge, RN, MHA; Ysidro Evert, RN    Medication changes reported      No    Fall or balance concerns reported     No    Tobacco Cessation  No Change    Warm-up and Cool-down  Performed as group-led instruction    Resistance Training Performed  Yes    VAD Patient?  No    PAD/SET Patient?  No      Pain Assessment   Currently in Pain?  No/denies    Multiple Pain Sites  No       Capillary Blood Glucose: No results found for this or any previous visit (from the past 24 hour(s)).  Exercise Prescription Changes - 10/08/17 1200      Response to Exercise   Blood Pressure (Admit)  112/60    Blood Pressure (Exercise)  118/72    Blood Pressure (Exit)  100/60    Heart Rate (Admit)  75 bpm    Heart Rate (Exercise)  84 bpm    Heart Rate (Exit)  67 bpm    Oxygen Saturation (Admit)  95 %    Oxygen Saturation (Exercise)  94 %    Oxygen Saturation (Exit)  97 %    Rating of Perceived Exertion (Exercise)  13    Perceived Dyspnea (Exercise)  1    Duration  Progress to 45 minutes of aerobic exercise without signs/symptoms of physical distress    Intensity  THRR unchanged      Progression   Progression  Continue to progress workloads to maintain intensity without signs/symptoms of physical distress.      Resistance Training   Training Prescription  Yes    Weight  orange  bands    Reps  10-15    Time  10 Minutes      Interval Training   Interval Training  No      NuStep   Level  5    SPM  80    Minutes  17    METs  2.4      Arm Ergometer   Level  4    Watts  10    Minutes  17      Track   Laps  8    Minutes  17       Social History   Tobacco Use  Smoking Status Current Every Day Smoker  . Packs/day: 1.00  . Types: Cigarettes  . Last attempt to quit: 09/20/2016  . Years since quitting: 1.0  Smokeless Tobacco Never Used  Tobacco Comment   pt started back smoking 08/08/16    Goals Met:  Exercise tolerated well No report of cardiac concerns or symptoms Strength training completed today  Goals Unmet:  Not Applicable  Comments: Service time is from 1030 to 1215    Dr. Rush Farmer is Medical  Director for Pulmonary Rehab at [facility].

## 2017-10-10 ENCOUNTER — Encounter (HOSPITAL_COMMUNITY)
Admission: RE | Admit: 2017-10-10 | Discharge: 2017-10-10 | Disposition: A | Discharge status: Home / Self Care | Payer: Medicare Other | Source: Ambulatory Visit | Attending: Internal Medicine | Admitting: Internal Medicine

## 2017-10-10 DIAGNOSIS — J449 Chronic obstructive pulmonary disease, unspecified: Secondary | ICD-10-CM

## 2017-10-10 NOTE — Progress Notes (Signed)
Daily Session Note  Patient Details  Name: Breanna Dixon MRN: [id number] Date of Birth: May 02, 1945 Referring Provider:     Pulmonary Rehab Walk Test from 07/02/2017 in [place]  Referring Provider  Dr. Annamaria Boots      Encounter Date: 10/10/2017  Check In: Session Check In - 10/10/17 1133      Check-In   Supervising physician immediately available to respond to emergencies  [facility] immediately available    Physician(s)  Dr. Darrick Meigs     Location  [facility]    Staff Present  Rodney Langton, RN;Carlette Wilber Oliphant, RN, BSN;Joann Rion, RN, BSN;Ramon Dredge, RN, MHA    Medication changes reported      No    Fall or balance concerns reported     No    Tobacco Cessation  No Change    Warm-up and Cool-down  Performed as group-led instruction    Resistance Training Performed  No    VAD Patient?  No    PAD/SET Patient?  No      Pain Assessment   Currently in Pain?  No/denies       Capillary Blood Glucose: No results found for this or any previous visit (from the past 24 hour(s)).    Social History   Tobacco Use  Smoking Status Current Every Day Smoker  . Packs/day: 1.00  . Types: Cigarettes  . Last attempt to quit: 09/20/2016  . Years since quitting: 1.0  Smokeless Tobacco Never Used  Tobacco Comment   pt started back smoking 08/08/16    Goals Met:  Exercise tolerated well No report of cardiac concerns or symptoms Strength training completed today  Goals Unmet:  Not Applicable  Comments: Service time is from 1030 to 1150    Dr. Rush Farmer is Medical Director for Pulmonary Rehab at [facility].

## 2017-10-15 ENCOUNTER — Encounter (HOSPITAL_COMMUNITY)
Admission: RE | Admit: 2017-10-15 | Discharge: 2017-10-15 | Disposition: A | Discharge status: Home / Self Care | Payer: Medicare Other | Source: Ambulatory Visit | Attending: Internal Medicine | Admitting: Internal Medicine

## 2017-10-15 DIAGNOSIS — J449 Chronic obstructive pulmonary disease, unspecified: Secondary | ICD-10-CM

## 2017-10-15 NOTE — Progress Notes (Signed)
Daily Session Note  Patient Details  Name: Breanna Dixon MRN: [id number] Date of Birth: 26-May-1945 Referring Provider:     Pulmonary Rehab Walk Test from 07/02/2017 in [place]  Referring Provider  Dr. Annamaria Boots      Encounter Date: 10/15/2017  Check In: Session Check In - 10/15/17 1224      Check-In   Supervising physician immediately available to respond to emergencies  [facility] immediately available    Physician(s)  Dr. Darrick Meigs     Location  [facility]    Staff Present  Rodney Langton, RN;Carlette Wilber Oliphant, RN, BSN;Ramon Dredge, RN, MHA;Janey Petron, MS, ACSM RCEP, Exercise Physiologist    Medication changes reported      No    Fall or balance concerns reported     No    Tobacco Cessation  No Change    Warm-up and Cool-down  Performed as group-led instruction    Resistance Training Performed  Yes    VAD Patient?  No    PAD/SET Patient?  No      Pain Assessment   Currently in Pain?  No/denies    Multiple Pain Sites  No       Capillary Blood Glucose: No results found for this or any previous visit (from the past 24 hour(s)).    Social History   Tobacco Use  Smoking Status Current Every Day Smoker  . Packs/day: 1.00  . Types: Cigarettes  . Last attempt to quit: 09/20/2016  . Years since quitting: 1.0  Smokeless Tobacco Never Used  Tobacco Comment   pt started back smoking 08/08/16    Goals Met:  Exercise tolerated well  Goals Unmet:  Not Applicable  Comments: Service time is from 10:30a to 12:20p    Dr. Rush Farmer is Medical Director for Pulmonary Rehab at [facility]

## 2017-10-21 ENCOUNTER — Other Ambulatory Visit: Payer: Self-pay | Admitting: Family Medicine

## 2017-10-22 ENCOUNTER — Other Ambulatory Visit: Payer: Self-pay | Admitting: Family Medicine

## 2017-10-22 DIAGNOSIS — Z1231 Encounter for screening mammogram for malignant neoplasm of breast: Secondary | ICD-10-CM

## 2017-10-25 ENCOUNTER — Encounter: Payer: Self-pay | Admitting: Family Medicine

## 2017-10-25 ENCOUNTER — Ambulatory Visit (INDEPENDENT_AMBULATORY_CARE_PROVIDER_SITE_OTHER): Payer: Medicare Other | Admitting: Family Medicine

## 2017-10-25 VITALS — BP 110/70 | HR 71 | Temp 98.8°F | Wt 217.8 lb

## 2017-10-25 DIAGNOSIS — B373 Candidiasis of vulva and vagina: Secondary | ICD-10-CM | POA: Diagnosis present

## 2017-10-25 DIAGNOSIS — L308 Other specified dermatitis: Secondary | ICD-10-CM | POA: Diagnosis not present

## 2017-10-25 DIAGNOSIS — B3731 Acute candidiasis of vulva and vagina: Secondary | ICD-10-CM

## 2017-10-25 MED ORDER — TRIAMCINOLONE ACETONIDE 0.1 % EX OINT: 1.0000 "application " | TOPICAL_OINTMENT | Freq: Two times a day (BID) | CUTANEOUS | 0 refills | Status: DC

## 2017-10-25 MED ORDER — FLUCONAZOLE 150 MG PO TABS: 150.0000 mg | ORAL_TABLET | Freq: Once | ORAL | 0 refills | Status: AC

## 2017-10-25 NOTE — Patient Instructions (Signed)
It was great seeing you today!  Your vaginal lesion is consistent with a yeast infection, due to the itching and overall redness.  Luckily there is no discrete mass that needs to be drained or biopsied.  In regards to your redness and itching on your arm.  I think this can be treated with a topical steroid.  Letter for the your biopsy was negative for anything malignant.  You have a benign mass called a hemangioma.

## 2017-10-26 ENCOUNTER — Encounter: Payer: Self-pay | Admitting: Family Medicine

## 2017-10-26 DIAGNOSIS — L309 Dermatitis, unspecified: Secondary | ICD-10-CM | POA: Insufficient documentation

## 2017-10-26 DIAGNOSIS — B3731 Acute candidiasis of vulva and vagina: Secondary | ICD-10-CM | POA: Insufficient documentation

## 2017-10-26 DIAGNOSIS — B373 Candidiasis of vulva and vagina: Secondary | ICD-10-CM | POA: Insufficient documentation

## 2017-10-26 NOTE — Assessment & Plan Note (Signed)
Consistent with a yeast infection of right vulvar area.  Treat with 1 Diflucan dose.  Counseled patient that it may require second dose, she is to call if needed.  Second dose is not held she will need to come back for reevaluation.

## 2017-10-26 NOTE — Assessment & Plan Note (Signed)
Appears to have eczematous lesion on left arm.  Did respond to steroids last time given her lack of other systemic symptoms with topical steroid.  Triamcinolone 0.05% prescribed

## 2017-10-26 NOTE — Progress Notes (Signed)
   HPI 72 year old female who presents for evaluation of labial "rash".  Patient notes that she first noticed an itching, and redness or labral area couple months ago.  She has been having itching off and on for several months.  States that a burst of steroids she was given a couple months ago seemed to make it go away.  Stopping steroids did seem to come back.  She is tried nothing to relieve itching.  Patient also with increased pruritus of left arm.  Excoriations consistent past eczematous flare.  Was given oral steroids last time due to other issues she was having this to help her a lot.  Review results of shave biopsy.  Benign hemangioma, gave patient copy report.  CC: Labial itching   ROS:  Review of Systems See HPI for ROS.   CC, SH/smoking status, and VS noted  Objective: BP 110/70 (BP Location: Right Arm, Patient Position: Sitting, Cuff Size: Normal)   Pulse 71   Temp 98.8 F (37.1 C) (Oral)   Wt 217 lb 12.8 oz (98.8 kg)   LMP 06/04/2011   SpO2 96%   BMI 33.61 kg/m  Gen: NAD, alert, cooperative, and pleasant.  Caucasian female resting comfortably on exam table. CV: RRR, no murmur.  Palpable peripheral pulses Resp: CTAB, no wheezes, non-labored Abd: SNTND, BS present, no guarding or organomegaly Ext: No edema, warm Neuro: Alert and oriented, Speech clear, No gross deficits GU: Scaly, erythematous rash on the right aspect of the vulva.  Excoriation noted. DERM: Scaly, plantar, plaque multiple number noted on dorsal surface left arm.  Creation noted   Assessment and plan:  Yeast infection involving the vagina and surrounding area Consistent with a yeast infection of right vulvar area.  Treat with 1 Diflucan dose.  Counseled patient that it may require second dose, she is to call if needed.  Second dose is not held she will need to come back for reevaluation.  Eczema Appears to have eczematous lesion on left arm.  Did respond to steroids last time given her lack of  other systemic symptoms with topical steroid.  Triamcinolone 0.05% prescribed   No orders of the defined types were placed in this encounter.   Meds ordered this encounter  Medications  . triamcinolone ointment (KENALOG) 0.1 %    Sig: Apply 1 application topically 2 (two) times daily.    Dispense:  30 g    Refill:  0  . fluconazole (DIFLUCAN) 150 MG tablet    Sig: Take 1 tablet (150 mg total) by mouth once for 1 dose.    Dispense:  1 tablet    Refill:  0     Guadalupe Dawn MD PGY-2 Family Medicine Resident  10/26/2017 10:22 AM

## 2017-10-28 ENCOUNTER — Encounter: Payer: Self-pay | Admitting: Gastroenterology

## 2017-11-01 ENCOUNTER — Ambulatory Visit (INDEPENDENT_AMBULATORY_CARE_PROVIDER_SITE_OTHER): Payer: Medicare Other | Admitting: Adult Health

## 2017-11-01 ENCOUNTER — Encounter: Payer: Self-pay | Admitting: Adult Health

## 2017-11-01 VITALS — BP 138/66 | HR 76 | Temp 97.2°F | Ht 66.0 in | Wt 213.6 lb

## 2017-11-01 DIAGNOSIS — J449 Chronic obstructive pulmonary disease, unspecified: Secondary | ICD-10-CM | POA: Diagnosis not present

## 2017-11-01 MED ORDER — LEVALBUTEROL HCL 0.63 MG/3ML IN NEBU
0.6300 mg | INHALATION_SOLUTION | Freq: Once | RESPIRATORY_TRACT | Status: AC
  Administered 2017-11-01: 0.63 mg via RESPIRATORY_TRACT

## 2017-11-01 MED ORDER — PREDNISONE 10 MG PO TABS: ORAL_TABLET | ORAL | 0 refills | Status: DC

## 2017-11-01 MED ORDER — DOXYCYCLINE HYCLATE 100 MG PO TABS: 100.0000 mg | ORAL_TABLET | Freq: Two times a day (BID) | ORAL | 0 refills | Status: DC

## 2017-11-01 NOTE — Progress Notes (Signed)
@Patient  ID: Breanna Dixon, female    DOB: 09/20/1945, 72 y.o.   MRN: [id number]  Chief Complaint  Patient presents with  . 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 FVC 3.08/96%, FEV1 2.01/86%, FEV1/FVC 0.65. TLC 95%, DLCO 73%. Quant TB Gold assay NEG 05/16/12, CT chest 08/13/12- The nodule also shows no evidence (IR did not do needle bx.) of contrast enhancement. Given all of these factors as well as the  presence of multiple abutting blood vessels which may make the  biopsy of higher risk, decision was made to not perform a percutaneous biopsy V/Q in 2016 intermediate for PE- has filter Office Spirometry 09/10/16-severe obstructive airways disease. FVC 1.91/54%, FEV1 1.31/42%, ratio 0.76, FEF 25-75% 0.50/23%.   11/01/2017 Acute office Visit : COPD  Pt presents for an acute office visit. Complains of 3 days of cough , congestion with yellow mucus , wheezing and increased dyspnea.  No otc used. No fever , hemoptysis . Has some chills. No recent abx or travel .  Recently finished pulmonary rehab.  Taking TRELEGY , BEVESPI and Combivent.  Pt education given on duplicate meds Last cxr 04/2017 , chronic changes   Appetite is good.   Allergies  Allergen Reactions  . Imitrex [Sumatriptan Base] Nausea And Vomiting    Rapid heart beat.  [name] Ketorolac Tromethamine Nausea And Vomiting    Rapid heart beat.  . Stadol [Butorphanol Tartrate] Nausea And Vomiting  . Iodinated Diagnostic Agents Nausea Only and Palpitations    Can tolerate iodine The old IV dye - she is not  allergic to the new IV dye  . Shellfish Allergy Nausea Only, Swelling and Palpitations    Immunization History  Administered Date(s) Administered  . Influenza Split 10/30/2010, 11/06/2011, 05/03/2014  . Influenza Whole 12/30/2012  . Influenza, High Dose Seasonal PF 10/23/2016  . Influenza, Seasonal, Injecte, Preservative Fre 12/26/2012, 11/04/2014  . Influenza,inj,Quad PF,6+ Mos 12/26/2012, 11/04/2014  . Influenza-Unspecified 10/07/2015  . Pneumococcal Conjugate-13 09/10/2016  . Pneumococcal Polysaccharide-23 07/09/2012, 11/04/2014  . Tdap 04/26/2012    Past Medical History:  Diagnosis Date  . Anxiety    takes Ativan daily, Panic attack   . Arthritis   . Bleeding ulcer 1974  . Breast cancer ([place])    right breast 2000  . Cancer [facility])    Surgery only, Lung Cancer- surgery only  . COPD (chronic obstructive pulmonary disease) ([place])   . Depression   . Fibromyalgia    takes Lyrica daily  . Head injury with loss of consciousness ([place])   . Headache(784.0)    takes Tegretol and Verapamil nightly  . History of blood transfusion    no abnormal reaction noted  . History of bronchitis   . History of colon polyps    benign  . History of kidney stones    several  . History of ulcer disease    pyloric   . Hyperlipidemia    takes Fenofibrate daily  . Hypertension    takes Toprol daily  . Hypertension   . Joint pain   . Lung mass April 2013   Right lower lobe lung  mass  . Lupus ([place])    takes Cymbalta,Plaquenil  daily  . Pneumonia    hx of  . PTSD (post-traumatic stress disorder)    takes Cymbalta daily  . Pulmonary embolism ([place]) 2006   IVC filter  . Seasonal allergies    flonase prn  . Vertigo    takes Antivert prn    Tobacco History: Social History   Tobacco Use  Smoking Status Current Every Day Smoker  . Packs/day: 1.00  . Types: Cigarettes  . Last attempt to quit: 09/20/2016  . Years since quitting: 1.1  Smokeless Tobacco Never Used  Tobacco Comment   pt  started back smoking 08/08/16   Ready to quit: No Counseling given: Yes Comment: pt started back smoking 08/08/16   Outpatient Medications Prior to Visit  Medication Sig Dispense Refill  . albuterol (PROVENTIL) (2.5 MG/3ML) 0.083% nebulizer solution Take 3 mLs (2.5 mg total) by nebulization every 6 (six) hours as needed for wheezing or shortness of breath. 15 mL 12  . aspirin EC 325 MG tablet Take 1 tablet (325 mg total) by mouth daily. 30 tablet 0  . Brexpiprazole (REXULTI) 2 MG TABS Take 2.5 mg by mouth daily.     . carbamazepine (TEGRETOL XR) 200 MG 12 hr tablet TAKE 1 TABLET BY MOUTH ONCE DAILY IN THE MORNING AND 2 ONCE DAILY IN THE EVENING 30 tablet 1  . fenofibrate (TRICOR) 145 MG tablet Take 1 tablet (145 mg total) by mouth daily. 60 tablet 2  . Fluticasone-Umeclidin-Vilant (TRELEGY ELLIPTA) 100-62.5-25 MCG/INH AEPB Inhale 1 puff into the lungs daily. Rise mouth after each use. 1 each 4  . furosemide (LASIX) 20 MG tablet Take 1 tablet (20 mg total) by mouth daily. 30 tablet 2  . HYDROcodone-acetaminophen (NORCO) 7.5-325 MG tablet Take 1 tablet by mouth 3 (three) times daily.    . hydroxychloroquine (PLAQUENIL) 200 MG tablet Take 1 tablet (200 mg total) by mouth 2 (two) times daily. 60 tablet 2  . hydrOXYzine (VISTARIL) 25 MG capsule Take 25 mg by mouth as needed.    . Ipratropium-Albuterol (COMBIVENT RESPIMAT) 20-100 MCG/ACT AERS respimat INHALE TWO PUFFS INTO LUNGS  EVERY 6 HOURS AS NEEDED FOR SHORTNESS OF BREATH 1 Inhaler 5  . MYRBETRIQ 50 MG TB24 tablet Take 50 mg by mouth daily.   11  . Nebulizers (COMPRESSOR/NEBULIZER) MISC Dx COPD, use as needed 1 each 0  . pregabalin (LYRICA) 100 MG capsule Take 1-2 capsules (100-200 mg total) by mouth 2 (two) times daily. 1 tab in the am and 2 tabs at night. (Patient taking differently: Take 200 mg by mouth 2 (two) times daily. ) 90 capsule 1  . ranitidine (ZANTAC) 150 MG capsule TAKE 1 CAPSULE BY MOUTH TWICE DAILY (Patient taking differently: Take  150 mg by mouth 2 (two) times daily. ) 90 capsule 3  . triamcinolone ointment (KENALOG) 0.1 % Apply 1 application topically 2 (two) times daily. 30 g 0  . verapamil (CALAN-SR) 120 MG CR tablet TAKE 1 TABLET BY MOUTH AT BEDTIME 30 tablet 2   No facility-administered medications prior to visit.      Review of Systems  Constitutional:   No  weight loss, night sweats,  Fevers, chills, +fatigue, or  lassitude.  HEENT:   No headaches,  Difficulty swallowing,  Tooth/dental problems, or  Sore throat,                No sneezing, itching, ear ache, nasal congestion, post nasal drip,  CV:  No chest pain,  Orthopnea, PND, swelling in lower extremities, anasarca, dizziness, palpitations, syncope.   GI  No heartburn, indigestion, abdominal pain, nausea, vomiting, diarrhea, change in bowel habits, loss of appetite, bloody stools.   Resp:  .  No chest wall deformity  Skin: no rash or lesions.  GU: no dysuria, change in color of urine, no urgency or frequency.  No flank pain, no hematuria   MS:  No joint pain or swelling.  No decreased range of motion.  No back pain.    Physical Exam  BP 138/66 (BP Location: Right Arm, Cuff Size: Normal)   Pulse 76   Temp (!) 97.2 F (36.2 C) (Oral)   Ht 5\' 6"  (1.676 m)   Wt 213 lb 9.6 oz (96.9 kg)   LMP 06/04/2011   SpO2 95%   BMI 34.48 kg/m   GEN: A/Ox3; pleasant , NAD, obese    HEENT:  [place]/AT,  EACs-clear, TMs-wnl, NOSE-clear, THROAT-clear, no lesions, no postnasal drip or exudate noted.   NECK:  Supple w/ fair ROM; no JVD; normal carotid impulses w/o bruits; no thyromegaly or nodules palpated; no lymphadenopathy.    RESP  Few trace wheezing  no accessory muscle use, no dullness to percussion  CARD:  RRR, no m/r/g, no peripheral edema, pulses intact, no cyanosis or clubbing.  GI:   Soft & nt; nml bowel sounds; no organomegaly or masses detected.   Musco: Warm bil, no deformities or joint swelling noted.   Neuro: alert, no focal deficits  noted.    Skin: Warm,  Discoid rash along forearm     Lab Results:    Imaging: No results found.   Assessment & Plan:   COPD mixed type ([place]) Exacerbation - xopenex neb x 1   Plan  Patient Instructions  Stop BEVESPI .  Continue on TRELEGY  1 puff daily, Rinse after use.  Use Combivent 2 puffs every 4hr as needed.  Begin Doxycycline 100mg  Twice daily  For 7 days , take w/ food .  Begin Prednisone taper over next week.  Mucinex DM Twice daily  .As needed  Cough/congestion  Follow up with Dr. Annamaria Boots  In 3 months and As needed   Please contact office for sooner follow up if symptoms do not improve or worsen or seek emergency care           Rexene Edison, NP 11/01/2017

## 2017-11-01 NOTE — Patient Instructions (Addendum)
Stop BEVESPI .  Continue on TRELEGY  1 puff daily, Rinse after use.  Use Combivent 2 puffs every 4hr as needed.  Begin Doxycycline 100mg  Twice daily  For 7 days , take w/ food .  Begin Prednisone taper over next week.  Mucinex DM Twice daily  .As needed  Cough/congestion  Follow up with Dr. Annamaria Boots  In 3 months and As needed   Please contact office for sooner follow up if symptoms do not improve or worsen or seek emergency care

## 2017-11-01 NOTE — Assessment & Plan Note (Signed)
Exacerbation - xopenex neb x 1   Plan  Patient Instructions  Stop BEVESPI .  Continue on TRELEGY  1 puff daily, Rinse after use.  Use Combivent 2 puffs every 4hr as needed.  Begin Doxycycline 100mg  Twice daily  For 7 days , take w/ food .  Begin Prednisone taper over next week.  Mucinex DM Twice daily  .As needed  Cough/congestion  Follow up with Dr. Annamaria Boots  In 3 months and As needed   Please contact office for sooner follow up if symptoms do not improve or worsen or seek emergency care

## 2017-11-01 NOTE — Addendum Note (Signed)
Addended by: Parke Poisson E on: 11/01/2017 12:49 PM   Modules accepted: Orders

## 2017-11-11 ENCOUNTER — Ambulatory Visit (INDEPENDENT_AMBULATORY_CARE_PROVIDER_SITE_OTHER): Payer: Medicare Other

## 2017-11-11 ENCOUNTER — Ambulatory Visit (HOSPITAL_COMMUNITY)
Admission: EM | Admit: 2017-11-11 | Discharge: 2017-11-11 | Disposition: A | Discharge status: Home / Self Care | Payer: Medicare Other | Attending: Family Medicine | Admitting: Family Medicine

## 2017-11-11 ENCOUNTER — Encounter (HOSPITAL_COMMUNITY): Payer: Self-pay | Admitting: Emergency Medicine

## 2017-11-11 DIAGNOSIS — J441 Chronic obstructive pulmonary disease with (acute) exacerbation: Secondary | ICD-10-CM

## 2017-11-11 MED ORDER — PREDNISONE 20 MG PO TABS: ORAL_TABLET | ORAL | 1 refills | Status: DC

## 2017-11-11 MED ORDER — OFLOXACIN 400 MG PO TABS: 400.0000 mg | ORAL_TABLET | Freq: Two times a day (BID) | ORAL | 0 refills | Status: DC

## 2017-11-11 NOTE — ED Triage Notes (Signed)
PT saw her doctor last week for cough. PT was given steroids and an antibiotic which she finished Thursday. PT developed a fever Friday and cough has been ongoing.

## 2017-11-11 NOTE — Discharge Instructions (Addendum)
X-ray does not show any definite filtrate but there is quite a bit of inflammation around the bronchial tubes.

## 2017-11-11 NOTE — ED Provider Notes (Signed)
[place]    CSN: [id number] Arrival date & time: 11/11/17  1204     History   Chief Complaint Chief Complaint  Patient presents with  . Cough  . Fever    HPI Breanna Dixon is a 72 y.o. female.   PT saw her doctor last week for cough. PT was given steroids and an antibiotic which she finished Thursday. PT developed a fever Friday and cough has been ongoing.  In summary, patient's been short of breath for about 10 days.  She did get some relief when she took the steroids but she notes that usually she has to take 2 weeks of a taper when she has an exacerbation like this. Patient's notes that she has had a right lower lobe lobectomy.  Patient has been diagnosed as stage III severe COPD Gold.  Note from PCP on 9/27: 72 year old female  smoker followed for COPD, right lower lobe carcinoid nodule status post right lower back to me, PE status post IVC filter Medical history significant for discoid lupus (on Plaquenil) , bilateral breast cancer status post lumpectomy Retired Therapist, sports     Past Medical History:  Diagnosis Date  . Anxiety    takes Ativan daily, Panic attack   . Arthritis   . Bleeding ulcer 1974  . Breast cancer ([place])    right breast 2000  . Cancer [facility])    Surgery only, Lung Cancer- surgery only  . COPD (chronic obstructive pulmonary disease) ([place])   . Depression   . Fibromyalgia    takes Lyrica daily  . Head injury with loss of consciousness ([place])   . Headache(784.0)    takes Tegretol and Verapamil nightly  . History of blood transfusion    no abnormal reaction noted  . History of bronchitis   . History of colon polyps    benign  . History of kidney stones    several  . History of ulcer disease    pyloric   . Hyperlipidemia    takes Fenofibrate daily  . Hypertension    takes Toprol daily  . Hypertension   . Joint pain   . Lung mass April 2013   Right lower lobe lung mass  . Lupus ([place])    takes Cymbalta,Plaquenil  daily  .  Pneumonia    hx of  . PTSD (post-traumatic stress disorder)    takes Cymbalta daily  . Pulmonary embolism ([place]) 2006   IVC filter  . Seasonal allergies    flonase prn  . Vertigo    takes Antivert prn    Patient Active Problem List   Diagnosis Date Noted  . Yeast infection involving the vagina and surrounding area 10/26/2017  . Eczema 10/26/2017  . Nevus simplex 09/30/2017  . Fungal infection 09/30/2017  . Migraine 09/30/2017  . Rash and nonspecific skin eruption 09/11/2017  . Astigmatism 09/04/2017  . Nevus, non-neoplastic 09/04/2017  . Lupus ([place]) 08/17/2017  . Fall 06/09/2017  . Irritable bowel syndrome with both constipation and diarrhea 06/09/2017  . Encounter for smoking cessation counseling 06/09/2017  . Healthcare maintenance 04/19/2017  . Hypoxia   . Snoring 01/10/2017  . Primary osteoarthritis of left hip 09/26/2016  . Osteoarthritis of left hip 09/21/2016  . Right-sided chest wall pain 11/04/2014  . S/P lobectomy of lung-carcinoid 2016 07/15/2014  . Granulomatous lung disease ([place]) 05/25/2012  . Breast mass, left UOQ 01/23/2012  . Acute respiratory failure with hypoxia ([place]) 01/05/2012  . COPD mixed type ([place]) 01/05/2012  .  Tobacco abuse 01/05/2012  . Hypertension   . 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 ABDOMINAL HYSTERECTOMY  1992  . APPENDECTOMY  1965  . BACK SURGERY  2000/2010   fusion  . BREAST EXCISIONAL BIOPSY    . BREAST LUMPECTOMY    . BREAST SURGERY     bilateral partial masectomy  . CARDIAC CATHETERIZATION  1992   no PCI  . CHOLECYSTECTOMY  1994  . COLONOSCOPY    . CYSTOSCOPY     multiple  . DILATION AND CURETTAGE OF UTERUS    . HERNIA REPAIR    . INCISIONAL HERNIA REPAIR    . IVC filter    . JOINT REPLACEMENT Bilateral    Knees  . KNEE ARTHROSCOPY Bilateral   . MULTIPLE TOOTH EXTRACTIONS  1990  . THORACOTOMY/LOBECTOMY Right 07/15/2014   Procedure: RIGHT THORACOTOMY/RIGHT LOWER  LOBE  LOBECTOMY;  Surgeon: Gaye Pollack, MD;  Location: [facility];  Service: Thoracic;  Laterality: Right;  . TONSILLECTOMY  1967  . TOTAL HIP ARTHROPLASTY Left 09/26/2016   Procedure: TOTAL HIP ARTHROPLASTY ANTERIOR APPROACH;  Surgeon: Frederik Pear, MD;  Location: [facility];  Service: Orthopedics;  Laterality: Left;  . TOTAL KNEE ARTHROPLASTY  06/04/2011   Procedure: TOTAL KNEE ARTHROPLASTY;  Surgeon: Rudean Haskell, MD;  Location: [facility];  Service: Orthopedics;  Laterality: Right;  . TOTAL KNEE ARTHROPLASTY  10/15/2011   Procedure: TOTAL KNEE ARTHROPLASTY;  Surgeon: Rudean Haskell, MD;  Location: [facility];  Service: Orthopedics;  Laterality: Left;  . [place]  . VENA CAVA FILTER PLACEMENT  2006  . VIDEO BRONCHOSCOPY WITH ENDOBRONCHIAL NAVIGATION N/A 06/02/2014   Procedure: VIDEO BRONCHOSCOPY WITH ENDOBRONCHIAL NAVIGATION;  Surgeon: Collene Gobble, MD;  Location: [facility];  Service: Thoracic;  Laterality: N/A;    OB History   None      Home Medications    Prior to Admission medications   Medication Sig Start Date End Date Taking? Authorizing Provider  albuterol (PROVENTIL) (2.5 MG/3ML) 0.083% nebulizer solution Take 3 mLs (2.5 mg total) by nebulization every 6 (six) hours as needed for wheezing or shortness of breath. 09/02/17   Baird Lyons D, MD  aspirin EC 325 MG tablet Take 1 tablet (325 mg total) by mouth daily. 09/26/17   Valarie Merino, MD  Brexpiprazole (REXULTI) 2 MG TABS Take 2.5 mg by mouth daily.     [provider]  carbamazepine (TEGRETOL XR) 200 MG 12 hr tablet TAKE 1 TABLET BY MOUTH ONCE DAILY IN THE MORNING AND 2 ONCE DAILY IN THE EVENING 10/22/17   Guadalupe Dawn, MD  fenofibrate (TRICOR) 145 MG tablet Take 1 tablet (145 mg total) by mouth daily. 08/27/17   Guadalupe Dawn, MD  Fluticasone-Umeclidin-Vilant (TRELEGY ELLIPTA) 100-62.5-25 MCG/INH AEPB Inhale 1 puff into the lungs daily. Rise mouth after each use. 05/02/17   Young, Kasandra Knudsen, MD  furosemide  (LASIX) 20 MG tablet Take 1 tablet (20 mg total) by mouth daily. 09/04/17   Guadalupe Dawn, MD  HYDROcodone-acetaminophen (NORCO) 7.5-325 MG tablet Take 1 tablet by mouth 3 (three) times daily.    [provider]  hydroxychloroquine (PLAQUENIL) 200 MG tablet Take 1 tablet (200 mg total) by mouth 2 (two) times daily. 08/14/17   Guadalupe Dawn, MD  hydrOXYzine (VISTARIL) 25 MG capsule Take 25 mg by mouth as needed. 02/24/14   [provider]  Ipratropium-Albuterol (COMBIVENT RESPIMAT) 20-100 MCG/ACT AERS respimat INHALE  TWO PUFFS INTO LUNGS  EVERY 6 HOURS AS NEEDED FOR SHORTNESS OF BREATH 06/26/17   Guadalupe Dawn, MD  MYRBETRIQ 50 MG TB24 tablet Take 50 mg by mouth daily.  04/25/17   [provider]  Nebulizers (COMPRESSOR/NEBULIZER) MISC Dx COPD, use as needed 04/02/17   Patrecia Pour, Christean Grief, MD  ofloxacin (FLOXIN) 400 MG tablet Take 1 tablet (400 mg total) by mouth 2 (two) times daily. 11/11/17   Robyn Haber, MD  predniSONE (DELTASONE) 20 MG tablet Take 3 PO QAM x3days, 2 PO QAM x5days, 1 PO QAM x5 days 11/11/17   Robyn Haber, MD  pregabalin (LYRICA) 100 MG capsule Take 1-2 capsules (100-200 mg total) by mouth 2 (two) times daily. 1 tab in the am and 2 tabs at night. Patient taking differently: Take 200 mg by mouth 2 (two) times daily.  05/20/17   Guadalupe Dawn, MD  ranitidine (ZANTAC) 150 MG capsule TAKE 1 CAPSULE BY MOUTH TWICE DAILY Patient taking differently: Take 150 mg by mouth 2 (two) times daily.  08/28/17   Guadalupe Dawn, MD  triamcinolone ointment (KENALOG) 0.1 % Apply 1 application topically 2 (two) times daily. 10/25/17   Guadalupe Dawn, MD  verapamil (CALAN-SR) 120 MG CR tablet TAKE 1 TABLET BY MOUTH AT BEDTIME 10/22/17   Guadalupe Dawn, MD    Family History Family History  Adopted: Yes  Problem Relation Age of Onset  . Anesthesia problems Neg Hx   . Hypotension Neg Hx   . Malignant hyperthermia Neg Hx   . Pseudochol deficiency Neg Hx      Social History Social History   Tobacco Use  . Smoking status: Current Every Day Smoker    Packs/day: 1.00    Types: Cigarettes    Last attempt to quit: 09/20/2016    Years since quitting: 1.1  . Smokeless tobacco: Never Used  . Tobacco comment: pt started back smoking 08/08/16  Substance Use Topics  . Alcohol use: No    Alcohol/week: 0.0 standard drinks  . Drug use: No     Allergies   Imitrex [sumatriptan base]; Ketorolac tromethamine; Stadol [butorphanol tartrate]; Iodinated diagnostic agents; and Shellfish allergy   Review of Systems Review of Systems   Physical Exam Triage Vital Signs ED Triage Vitals [11/11/17 1250]  Enc Vitals Group     BP (!) 133/55     Pulse Rate 70     Resp 16     Temp 98.9 F (37.2 C)     Temp Source Oral     SpO2 99 %     Weight      Height      Head Circumference      Peak Flow      Pain Score 4     Pain Loc      Pain Edu?      Excl. in [place]?    No data found.  Updated Vital Signs BP (!) 133/55   Pulse 70   Temp 98.9 F (37.2 C) (Oral)   Resp 16   LMP 06/04/2011   SpO2 99%    Physical Exam  Constitutional: She is oriented to person, place, and time. She appears well-developed and well-nourished.  HENT:  Right Ear: External ear normal.  Left Ear: External ear normal.  Mouth/Throat: Oropharynx is clear and moist.  TMs normal  Eyes: Pupils are equal, round, and reactive to light. Conjunctivae are normal.  Neck: Normal range of motion. Neck supple.  Cardiovascular: Normal rate, regular rhythm and normal  heart sounds.  Pulmonary/Chest: She is in respiratory distress. She has wheezes.  Breath sounds are distant Barrel chest  Musculoskeletal: Normal range of motion.  Neurological: She is alert and oriented to person, place, and time.  Skin: Skin is warm and dry.  Nursing note and vitals reviewed.    UC Treatments / Results  Labs (all labs ordered are listed, but only abnormal results are displayed) Labs Reviewed -  No data to display  EKG None  Radiology Dg Chest 2 View  Result Date: 11/11/2017 CLINICAL DATA:  Cough and wheezing EXAM: CHEST - 2 VIEW COMPARISON:  May 02, 2017 FINDINGS: There is scarring in the lateral right base. A superimposed small pleural effusion on the right is question. The lungs elsewhere are clear. Heart size and pulmonary vascularity are normal. No adenopathy. There is aortic atherosclerosis. There is degenerative change in the thoracic spine. IMPRESSION: Scarring lateral right base with questionable small right pleural effusion. Lungs elsewhere clear. Stable cardiac silhouette. There is aortic atherosclerosis. Aortic Atherosclerosis (ICD10-I70.0). Electronically Signed   By: Lowella Grip III M.D.   On: 11/11/2017 13:35    Procedures Procedures (including critical care time)  Medications Ordered in UC Medications - No data to display  Initial Impression / Assessment and Plan / UC Course  I have reviewed the triage vital signs and the nursing notes.  Pertinent labs & imaging results that were available during my care of the patient were reviewed by me and considered in my medical decision making (see chart for details).      Final Clinical Impressions(s) / UC Diagnoses   Final diagnoses:  COPD exacerbation ([place])     Discharge Instructions     X-ray does not show any definite filtrate but there is quite a bit of inflammation around the bronchial tubes.    ED Prescriptions    Medication Sig Dispense Auth. Provider   ofloxacin (FLOXIN) 400 MG tablet Take 1 tablet (400 mg total) by mouth 2 (two) times daily. 20 tablet Robyn Haber, MD   predniSONE (DELTASONE) 20 MG tablet Take 3 PO QAM x3days, 2 PO QAM x5days, 1 PO QAM x5 days 24 tablet Robyn Haber, MD     Controlled Substance Prescriptions [place] Controlled Substance Registry consulted? Not Applicable   Robyn Haber, MD 11/11/17 1343

## 2017-11-15 ENCOUNTER — Other Ambulatory Visit: Payer: Self-pay | Admitting: Family Medicine

## 2017-11-20 ENCOUNTER — Ambulatory Visit: Payer: Medicare Other

## 2017-12-04 ENCOUNTER — Encounter (HOSPITAL_COMMUNITY): Payer: Self-pay | Admitting: Emergency Medicine

## 2017-12-04 ENCOUNTER — Ambulatory Visit (HOSPITAL_COMMUNITY)
Admission: EM | Admit: 2017-12-04 | Discharge: 2017-12-04 | Disposition: A | Discharge status: Home / Self Care | Payer: Medicare Other | Attending: Family Medicine | Admitting: Family Medicine

## 2017-12-04 DIAGNOSIS — R197 Diarrhea, unspecified: Secondary | ICD-10-CM | POA: Diagnosis not present

## 2017-12-04 DIAGNOSIS — R05 Cough: Secondary | ICD-10-CM | POA: Diagnosis not present

## 2017-12-04 DIAGNOSIS — R062 Wheezing: Secondary | ICD-10-CM

## 2017-12-04 LAB — POCT I-STAT, CHEM 8
BUN: 11 mg/dL (ref 8–23)
CALCIUM ION: 1.16 mmol/L (ref 1.15–1.40)
CREATININE: 0.8 mg/dL (ref 0.44–1.00)
Chloride: 105 mmol/L (ref 98–111)
GLUCOSE: 138 mg/dL — AB (ref 70–99)
HCT: 44 % (ref 36.0–46.0)
Hemoglobin: 15 g/dL (ref 12.0–15.0)
Potassium: 4.2 mmol/L (ref 3.5–5.1)
SODIUM: 140 mmol/L (ref 135–145)
TCO2: 26 mmol/L (ref 22–32)

## 2017-12-04 LAB — POCT URINALYSIS DIP (DEVICE)
Glucose, UA: NEGATIVE mg/dL
HGB URINE DIPSTICK: NEGATIVE
Ketones, ur: NEGATIVE mg/dL
LEUKOCYTES UA: NEGATIVE
Nitrite: NEGATIVE
PH: 5.5 (ref 5.0–8.0)
PROTEIN: NEGATIVE mg/dL
Specific Gravity, Urine: >=1.03 (ref 1.005–1.030)
UROBILINOGEN UA: 0.2 mg/dL (ref 0.0–1.0)

## 2017-12-04 MED ORDER — LOPERAMIDE HCL 2 MG PO CAPS: 2.0000 mg | ORAL_CAPSULE | Freq: Four times a day (QID) | ORAL | 0 refills | Status: DC | PRN

## 2017-12-04 MED ORDER — IPRATROPIUM-ALBUTEROL 0.5-2.5 (3) MG/3ML IN SOLN
3.0000 mL | Freq: Once | RESPIRATORY_TRACT | Status: AC
  Administered 2017-12-04: 3 mL via RESPIRATORY_TRACT

## 2017-12-04 MED ORDER — IPRATROPIUM-ALBUTEROL 0.5-2.5 (3) MG/3ML IN SOLN
RESPIRATORY_TRACT | Status: AC
  Filled 2017-12-04: qty 3

## 2017-12-04 NOTE — ED Triage Notes (Signed)
Pt also c/o nausea and diarrhea x1 week.

## 2017-12-04 NOTE — Discharge Instructions (Addendum)
I would like for you to obtain a stool sample to  test for C. Difficile. Go ahead and start taking a probiotic daily. If the results are  negative for Cdiff and the diarrhea is not better with the probiotic after one week then at that point you can fill the Imodium. Duo neb given in the clinic for wheezing.  Continue your inhalers at home.  Urine was negative for infection.  Follow up as needed for continued or worsening symptoms

## 2017-12-04 NOTE — ED Provider Notes (Signed)
[place]    CSN: [id number] Arrival date & time: 12/04/17  1030     History   Chief Complaint Chief Complaint  Patient presents with  . Cough    HPI Breanna Dixon is a 72 y.o. female.   Patient is a 72 year old female past medical history of anxiety, arthritis, cancer, COPD, fibromyalgia, depression, headaches, bronchitis, ulcers presents with 3 weeks of diarrhea, weakness, nausea, anorexia.  Her symptoms have been constant and remain the same.  She is having 4-5 which she describes as explosive stools a day. Denies any blood in the stool.   Reports her diarrhea is watery at times and semisolid others.  She is having generalized abdominal discomfort with this.  Patient has been on antibiotics twice in the last month for upper respiratory infections. Her coughing and breathing has improved.  She finished her last round of prednisone and abx  approximately 2 weeks ago.  She denies any fever, chills, body aches but she has general malaise.  No recent sick contacts or traveling. She reports that she has lost 18 pound in the last month due to not eating much. She has been drinking fluids but feels dehydrated. She has had a headache but has hx of migraines. She has also be suffering from a lot of anxiety about the whole situation.   ROS per HPI      Past Medical History:  Diagnosis Date  . Anxiety    takes Ativan daily, Panic attack   . Arthritis   . Bleeding ulcer 1974  . Breast cancer ([place])    right breast 2000  . Cancer [facility])    Surgery only, Lung Cancer- surgery only  . COPD (chronic obstructive pulmonary disease) ([place])   . Depression   . Fibromyalgia    takes Lyrica daily  . Head injury with loss of consciousness ([place])   . Headache(784.0)    takes Tegretol and Verapamil nightly  . History of blood transfusion    no abnormal reaction noted  . History of bronchitis   . History of colon polyps    benign  . History of kidney stones    several  . History  of ulcer disease    pyloric   . Hyperlipidemia    takes Fenofibrate daily  . Hypertension    takes Toprol daily  . Hypertension   . Joint pain   . Lung mass April 2013   Right lower lobe lung mass  . Lupus ([place])    takes Cymbalta,Plaquenil  daily  . Pneumonia    hx of  . PTSD (post-traumatic stress disorder)    takes Cymbalta daily  . Pulmonary embolism ([place]) 2006   IVC filter  . Seasonal allergies    flonase prn  . Vertigo    takes Antivert prn    Patient Active Problem List   Diagnosis Date Noted  . Yeast infection involving the vagina and surrounding area 10/26/2017  . Eczema 10/26/2017  . Nevus simplex 09/30/2017  . Fungal infection 09/30/2017  . Migraine 09/30/2017  . Rash and nonspecific skin eruption 09/11/2017  . Astigmatism 09/04/2017  . Nevus, non-neoplastic 09/04/2017  . Lupus ([place]) 08/17/2017  . Fall 06/09/2017  . Irritable bowel syndrome with both constipation and diarrhea 06/09/2017  . Encounter for smoking cessation counseling 06/09/2017  . Healthcare maintenance 04/19/2017  . Hypoxia   . Snoring 01/10/2017  . Primary osteoarthritis of left hip 09/26/2016  . Osteoarthritis of left hip 09/21/2016  . Right-sided  chest wall pain 11/04/2014  . S/P lobectomy of lung-carcinoid 2016 07/15/2014  . Granulomatous lung disease ([place]) 05/25/2012  . Breast mass, left UOQ 01/23/2012  . Acute respiratory failure with hypoxia ([place]) 01/05/2012  . COPD mixed type ([place]) 01/05/2012  . Tobacco abuse 01/05/2012  . Hypertension   . 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 ABDOMINAL HYSTERECTOMY  1992  . APPENDECTOMY  1965  . BACK SURGERY  2000/2010   fusion  . BREAST EXCISIONAL BIOPSY    . BREAST LUMPECTOMY    . BREAST SURGERY     bilateral partial masectomy  . CARDIAC CATHETERIZATION  1992   no PCI  . CHOLECYSTECTOMY  1994  . COLONOSCOPY    . CYSTOSCOPY     multiple  . DILATION AND CURETTAGE OF UTERUS      . HERNIA REPAIR    . INCISIONAL HERNIA REPAIR    . IVC filter    . JOINT REPLACEMENT Bilateral    Knees  . KNEE ARTHROSCOPY Bilateral   . MULTIPLE TOOTH EXTRACTIONS  1990  . THORACOTOMY/LOBECTOMY Right 07/15/2014   Procedure: RIGHT THORACOTOMY/RIGHT LOWER LOBE  LOBECTOMY;  Surgeon: Gaye Pollack, MD;  Location: [facility];  Service: Thoracic;  Laterality: Right;  . TONSILLECTOMY  1967  . TOTAL HIP ARTHROPLASTY Left 09/26/2016   Procedure: TOTAL HIP ARTHROPLASTY ANTERIOR APPROACH;  Surgeon: Frederik Pear, MD;  Location: [facility];  Service: Orthopedics;  Laterality: Left;  . TOTAL KNEE ARTHROPLASTY  06/04/2011   Procedure: TOTAL KNEE ARTHROPLASTY;  Surgeon: Rudean Haskell, MD;  Location: [facility];  Service: Orthopedics;  Laterality: Right;  . TOTAL KNEE ARTHROPLASTY  10/15/2011   Procedure: TOTAL KNEE ARTHROPLASTY;  Surgeon: Rudean Haskell, MD;  Location: [facility];  Service: Orthopedics;  Laterality: Left;  . [place]  . VENA CAVA FILTER PLACEMENT  2006  . VIDEO BRONCHOSCOPY WITH ENDOBRONCHIAL NAVIGATION N/A 06/02/2014   Procedure: VIDEO BRONCHOSCOPY WITH ENDOBRONCHIAL NAVIGATION;  Surgeon: Collene Gobble, MD;  Location: [facility];  Service: Thoracic;  Laterality: N/A;    OB History   None      Home Medications    Prior to Admission medications   Medication Sig Start Date End Date Taking? Authorizing Provider  albuterol (PROVENTIL) (2.5 MG/3ML) 0.083% nebulizer solution Take 3 mLs (2.5 mg total) by nebulization every 6 (six) hours as needed for wheezing or shortness of breath. 09/02/17   Baird Lyons D, MD  aspirin EC 325 MG tablet Take 1 tablet (325 mg total) by mouth daily. 09/26/17   Valarie Merino, MD  Brexpiprazole (REXULTI) 2 MG TABS Take 2.5 mg by mouth daily.     [provider]  carbamazepine (TEGRETOL XR) 200 MG 12 hr tablet TAKE 1 TABLET BY MOUTH ONCE DAILY IN THE MORNING AND 2 ONCE DAILY IN THE EVENING 11/15/17   Guadalupe Dawn, MD  fenofibrate (TRICOR) 145 MG  tablet Take 1 tablet (145 mg total) by mouth daily. 08/27/17   Guadalupe Dawn, MD  Fluticasone-Umeclidin-Vilant (TRELEGY ELLIPTA) 100-62.5-25 MCG/INH AEPB Inhale 1 puff into the lungs daily. Rise mouth after each use. 05/02/17   Young, Kasandra Knudsen, MD  furosemide (LASIX) 20 MG tablet Take 1 tablet (20 mg total) by mouth daily. 09/04/17   Guadalupe Dawn, MD  HYDROcodone-acetaminophen (NORCO) 7.5-325 MG tablet Take 1 tablet by mouth 3 (three) times daily.    [provider]  hydroxychloroquine (PLAQUENIL) 200 MG tablet TAKE  1 TABLET BY MOUTH TWICE DAILY 11/15/17   Guadalupe Dawn, MD  hydrOXYzine (VISTARIL) 25 MG capsule Take 25 mg by mouth as needed. 02/24/14   [provider]  Ipratropium-Albuterol (COMBIVENT RESPIMAT) 20-100 MCG/ACT AERS respimat INHALE TWO PUFFS INTO LUNGS  EVERY 6 HOURS AS NEEDED FOR SHORTNESS OF BREATH 06/26/17   Guadalupe Dawn, MD  loperamide (IMODIUM) 2 MG capsule Take 1 capsule (2 mg total) by mouth 4 (four) times daily as needed for diarrhea or loose stools. 12/04/17   Wynne Jury, Tressia Miners A, NP  MYRBETRIQ 50 MG TB24 tablet Take 50 mg by mouth daily.  04/25/17   [provider]  Nebulizers (COMPRESSOR/NEBULIZER) MISC Dx COPD, use as needed 04/02/17   Patrecia Pour, Christean Grief, MD  ofloxacin (FLOXIN) 400 MG tablet Take 1 tablet (400 mg total) by mouth 2 (two) times daily. 11/11/17   Robyn Haber, MD  predniSONE (DELTASONE) 20 MG tablet Take 3 PO QAM x3days, 2 PO QAM x5days, 1 PO QAM x5 days 11/11/17   Robyn Haber, MD  pregabalin (LYRICA) 100 MG capsule Take 1-2 capsules (100-200 mg total) by mouth 2 (two) times daily. 1 tab in the am and 2 tabs at night. Patient taking differently: Take 200 mg by mouth 2 (two) times daily.  05/20/17   Guadalupe Dawn, MD  ranitidine (ZANTAC) 150 MG capsule TAKE 1 CAPSULE BY MOUTH TWICE DAILY Patient taking differently: Take 150 mg by mouth 2 (two) times daily.  08/28/17   Guadalupe Dawn, MD  triamcinolone ointment (KENALOG) 0.1 %  Apply 1 application topically 2 (two) times daily. 10/25/17   Guadalupe Dawn, MD  verapamil (CALAN-SR) 120 MG CR tablet TAKE 1 TABLET BY MOUTH AT BEDTIME 10/22/17   Guadalupe Dawn, MD  verapamil (CALAN-SR) 120 MG CR tablet TAKE 1 TABLET BY MOUTH AT BEDTIME 11/15/17   Guadalupe Dawn, MD    Family History Family History  Adopted: Yes  Problem Relation Age of Onset  . Anesthesia problems Neg Hx   . Hypotension Neg Hx   . Malignant hyperthermia Neg Hx   . Pseudochol deficiency Neg Hx     Social History Social History   Tobacco Use  . Smoking status: Current Every Day Smoker    Packs/day: 1.00    Types: Cigarettes    Last attempt to quit: 09/20/2016    Years since quitting: 1.2  . Smokeless tobacco: Never Used  . Tobacco comment: pt started back smoking 08/08/16  Substance Use Topics  . Alcohol use: No    Alcohol/week: 0.0 standard drinks  . Drug use: No     Allergies   Imitrex [sumatriptan base]; Ketorolac tromethamine; Stadol [butorphanol tartrate]; Iodinated diagnostic agents; and Shellfish allergy   Review of Systems Review of Systems   Physical Exam Triage Vital Signs ED Triage Vitals  Enc Vitals Group     BP 12/04/17 1054 (!) 120/45     Pulse Rate 12/04/17 1054 80     Resp 12/04/17 1054 (!) 22     Temp 12/04/17 1054 98.1 F (36.7 C)     Temp src --      SpO2 12/04/17 1054 100 %     Weight --      Height --      Head Circumference --      Peak Flow --      Pain Score 12/04/17 1055 8     Pain Loc --      Pain Edu? --      Excl. in [place]? --  No data found.  Updated Vital Signs BP (!) 120/45   Pulse 80   Temp 98.1 F (36.7 C)   Resp (!) 22   LMP 06/04/2011   SpO2 100%   Visual Acuity Right Eye Distance:   Left Eye Distance:   Bilateral Distance:    Right Eye Near:   Left Eye Near:    Bilateral Near:     Physical Exam  Constitutional: She appears well-developed and well-nourished.  Very pleasant. Non toxic or ill appearing.   HENT:  Head:  Normocephalic and atraumatic.  Mucous membranes dry.   Eyes: Conjunctivae are normal.  Neck: Normal range of motion.  Cardiovascular: Normal rate, regular rhythm and normal heart sounds.  Pulmonary/Chest: Effort normal. No respiratory distress.  Mild inspiratory and expiratory wheezing throughout lung fields  Abdominal: Soft.  Musculoskeletal: Normal range of motion.  Neurological: She is alert.  Skin: Skin is warm and dry.  Psychiatric: She has a normal mood and affect.  Nursing note and vitals reviewed.    UC Treatments / Results  Labs (all labs ordered are listed, but only abnormal results are displayed) Labs Reviewed  POCT I-STAT, CHEM 8 - Abnormal; Notable for the following components:      Result Value   Glucose, Bld 138 (*)    All other components within normal limits  POCT URINALYSIS DIP (DEVICE) - Abnormal; Notable for the following components:   Bilirubin Urine SMALL (*)    All other components within normal limits    EKG None  Radiology No results found.  Procedures Procedures (including critical care time)  Medications Ordered in UC Medications  ipratropium-albuterol (DUONEB) 0.5-2.5 (3) MG/3ML nebulizer solution 3 mL (3 mLs Nebulization Given 12/04/17 1300)    Initial Impression / Assessment and Plan / UC Course  I have reviewed the triage vital signs and the nursing notes.  Pertinent labs & imaging results that were available during my care of the patient were reviewed by me and considered in my medical decision making (see chart for details).    DuoNeb given here in clinic for wheezing Lab work did not reveal any abnormalities to include dehydration Urine negative for infection Patient sent home with kit to obtain stool sample for C. difficile testing and instructed to return the sample Start daily probiotic while awaiting test results Prescription for Imodium printed out and she was instructed she can start this if the C. difficile test was  negative Patient understanding and agreeable to plan Final Clinical Impressions(s) / UC Diagnoses   Final diagnoses:  Diarrhea, unspecified type     Discharge Instructions     I would like for you to obtain a stool sample to  test for C. Difficile. Go ahead and start taking a probiotic daily. If the results are  negative for Cdiff and the diarrhea is not better with the probiotic after one week then at that point you can fill the Imodium. Duo neb given in the clinic for wheezing.  Continue your inhalers at home.  Urine was negative for infection.  Follow up as needed for continued or worsening symptoms       ED Prescriptions    Medication Sig Dispense Auth. Provider   loperamide (IMODIUM) 2 MG capsule Take 1 capsule (2 mg total) by mouth 4 (four) times daily as needed for diarrhea or loose stools. 12 capsule Loura Halt A, NP     Controlled Substance Prescriptions [place] Controlled Substance Registry consulted? Not Applicable   Loura Halt  A, NP 12/04/17 1337

## 2017-12-04 NOTE — ED Triage Notes (Signed)
Pt seen here for cough, given prednisone and antibiotic states she now has a fever and the cough is ongoing.

## 2017-12-04 NOTE — ED Notes (Signed)
Pt given neb at 1150pm..

## 2017-12-10 ENCOUNTER — Other Ambulatory Visit: Payer: Self-pay | Admitting: *Deleted

## 2017-12-10 ENCOUNTER — Other Ambulatory Visit: Payer: Self-pay | Admitting: Internal Medicine

## 2017-12-11 MED ORDER — CARBAMAZEPINE ER 200 MG PO TB12: ORAL_TABLET | ORAL | 3 refills | Status: AC

## 2017-12-12 ENCOUNTER — Encounter: Payer: Medicare Other | Admitting: Gastroenterology

## 2017-12-23 ENCOUNTER — Ambulatory Visit
Admission: RE | Admit: 2017-12-23 | Discharge: 2017-12-23 | Disposition: A | Discharge status: Home / Self Care | Payer: Medicare Other | Source: Ambulatory Visit | Attending: Family Medicine | Admitting: Family Medicine

## 2017-12-23 DIAGNOSIS — Z1231 Encounter for screening mammogram for malignant neoplasm of breast: Secondary | ICD-10-CM

## 2017-12-24 ENCOUNTER — Ambulatory Visit: Payer: Medicare Other | Admitting: Internal Medicine

## 2017-12-25 ENCOUNTER — Ambulatory Visit (INDEPENDENT_AMBULATORY_CARE_PROVIDER_SITE_OTHER): Payer: Medicare Other | Admitting: Family Medicine

## 2017-12-25 VITALS — BP 121/80 | HR 80 | Temp 98.5°F | Wt 204.6 lb

## 2017-12-25 DIAGNOSIS — R61 Generalized hyperhidrosis: Secondary | ICD-10-CM | POA: Diagnosis not present

## 2017-12-25 DIAGNOSIS — R197 Diarrhea, unspecified: Secondary | ICD-10-CM

## 2017-12-25 DIAGNOSIS — J449 Chronic obstructive pulmonary disease, unspecified: Secondary | ICD-10-CM | POA: Diagnosis not present

## 2017-12-25 NOTE — Patient Instructions (Addendum)
It was nice to meet you today,   I am ordering a test to see if your diarrhea is related to the antibiotics you've been on.  I have also ordered a blood test to check your electrolyte levels given your recent diarrhea.  I would like you to return in one week so that we can discuss the results.  If your symptoms haven't improved and we still can't find a cause I would like to order a CT scan of your chest and abdomen.  I don't think you are having an COPD exacerbation at this time.  We can assess your breathing again in a week when you come back.    Thanks,   Clemetine Marker, MD

## 2017-12-25 NOTE — Assessment & Plan Note (Addendum)
Patient has been having diarrhea for over a month now.  Chart review shows a diagnosis of IBS in July.  She alternates between diarrhea and constipation.  Unsure when she was first diagnosed with IBS but it would be unusual for her to develop IBS at such a late stage in her life.  Given her previous diagnosis of carcinoid tumor I am concerned her diarrhea could be caused by a recurrence or new carcinoid tumor.  Chronic nature of her diarrhea could be related to her antibiotic use.  Patient was TTP in the RUQ and epigastric region.  I have ordered a C. Diff stool test and will followup on results with patient on next visit.

## 2017-12-25 NOTE — Assessment & Plan Note (Signed)
Patient has been having almost daily night sweats for over a month.  She has a history of carcinoid tumor of the lung, is a smoker with COPD, and is on plaquenil for lupus.  Current differential for night sweats is malignancy, tuberculosis, and infection.  If she continues to have night sweats and her diarrhea and weight loss are not ruled out by C.diff we will order a CT chest and abdomen during her next visit.  We also ordered a CBC with differential and CMP.

## 2017-12-25 NOTE — Assessment & Plan Note (Signed)
Patient is having SOB with increased use of her PRN inhaler and nebulizer.  Has been given steroids and antibiotics 3 different times in the past month.  On exam today she did not have any wheezing or SOB at rest and her O2 sats were 94% on room air.  The patient is not having a COPD exacerbation at this time.  We will reassess her respiratory status upon her followup visit in one week.

## 2017-12-25 NOTE — Progress Notes (Signed)
[facility] Phone: [phone]   cc: night sweats, weight loss, diarrhea,   Subjective:  Patient's symptoms started over a month ago with diarrhea and night sweats. The night sweats occur about every other day. She then started having Shortness of breath and dyspnea on exertion. She went to her pulmonlogist who diagnosed her with a COPD exacerbation and gave her steroids and antibiotics for 7 days.  Her symptoms did not improve and she went to an urgent care center who also gave her steroids and a different antibiotic.  This did not improve symptoms either.  She went to another urgent care who also did the same thing for her and her symptoms did not improve.  Patient has been alternating between using combivent and her nebulizer for PRN treatment every other day or so. In addition to her diarrhea she has been having nausea but no vomiting and abdominal pain that improves with bowel movements.    Patient states she has lost 18 pounds in three weeks.   Of note patient has a history of COPD, is a smoker, has had lobectomy for carcinoid tumor of the lung, and is on plaquenil for lupus.    ROS: See HPI for pertinent positives and negatives  Past Medical History  Family history reviewed for today's visit. No changes.  Social history- patient is a current smoker  Objective: BP 121/80   Pulse 80   Temp 98.5 F (36.9 C) (Oral)   Wt 204 lb 9.6 oz (92.8 kg)   LMP 06/04/2011   SpO2 94%   BMI 33.02 kg/m  Gen: NAD, alert and oriented, cooperative with exam. Became SOB when gettnig up to examining table.  CV: normal rate, regular rhythm. No murmurs, no rubs.  Resp: no wheezes or rhonchi. Clear inspiratory but decreased air movement on exhalation.  Prolonged expiratory phase.   GI: TTP in RUQ and epigastric region.  BS present.  Patient had regurgitation when her epigastric region was palpated.  Msk: No edema, warm, normal tone, moves UE/LE spontaneously Skin: No rashes,  no lesions Psych: Appropriate behavior  Assessment/Plan: COPD mixed type ([place]) Patient is having SOB with increased use of her PRN inhaler and nebulizer.  Has been given steroids and antibiotics 3 different times in the past month.  On exam today she did not have any wheezing or SOB at rest and her O2 sats were 94% on room air.  The patient is not having a COPD exacerbation at this time.  We will reassess her respiratory status upon her followup visit in one week.    Night sweats Patient has been having almost daily night sweats for over a month.  She has a history of carcinoid tumor of the lung, is a smoker with COPD, and is on plaquenil for lupus.  Current differential for night sweats is malignancy, tuberculosis, and infection.  If she continues to have night sweats and her diarrhea and weight loss are not ruled out by C.diff we will order a CT chest and abdomen during her next visit.  We also ordered a CBC with differential and CMP.    Diarrhea Patient has been having diarrhea for over a month now.  Chart review shows a diagnosis of IBS in July.  She alternates between diarrhea and constipation.  Unsure when she was first diagnosed with IBS but it would be unusual for her to develop IBS at such a late stage in her life.  Given her previous diagnosis of carcinoid tumor  I am concerned her diarrhea could be caused by a recurrence or new carcinoid tumor.  Chronic nature of her diarrhea could be related to her antibiotic use.  Patient was TTP in the RUQ and epigastric region.  I have ordered a C. Diff stool test and will followup on results with patient on next visit.      Clemetine Marker, MD PGY-1

## 2017-12-26 LAB — CBC WITH DIFFERENTIAL/PLATELET
BASOS: 0 %
Basophils Absolute: 0 10*3/uL (ref 0.0–0.2)
EOS (ABSOLUTE): 0.1 10*3/uL (ref 0.0–0.4)
EOS: 1 %
HEMATOCRIT: 39.5 % (ref 34.0–46.6)
Hemoglobin: 12.6 g/dL (ref 11.1–15.9)
IMMATURE GRANULOCYTES: 0 %
Immature Grans (Abs): 0 10*3/uL (ref 0.0–0.1)
LYMPHS ABS: 1.7 10*3/uL (ref 0.7–3.1)
Lymphs: 22 %
MCH: 26.9 pg (ref 26.6–33.0)
MCHC: 31.9 g/dL (ref 31.5–35.7)
MCV: 84 fL (ref 79–97)
MONOS ABS: 0.5 10*3/uL (ref 0.1–0.9)
Monocytes: 6 %
NEUTROS PCT: 71 %
Neutrophils Absolute: 5.6 10*3/uL (ref 1.4–7.0)
PLATELETS: 296 10*3/uL (ref 150–450)
RBC: 4.68 x10E6/uL (ref 3.77–5.28)
RDW: 13.2 % (ref 12.3–15.4)
WBC: 7.9 10*3/uL (ref 3.4–10.8)

## 2017-12-26 LAB — COMPREHENSIVE METABOLIC PANEL
ALK PHOS: 61 IU/L (ref 39–117)
ALT: 11 IU/L (ref 0–32)
AST: 17 IU/L (ref 0–40)
Albumin/Globulin Ratio: 1.9 (ref 1.2–2.2)
Albumin: 4.3 g/dL (ref 3.5–4.8)
BILIRUBIN TOTAL: 0.2 mg/dL (ref 0.0–1.2)
BUN / CREAT RATIO: 21 (ref 12–28)
BUN: 16 mg/dL (ref 8–27)
CHLORIDE: 105 mmol/L (ref 96–106)
CO2: 23 mmol/L (ref 20–29)
CREATININE: 0.76 mg/dL (ref 0.57–1.00)
Calcium: 9.5 mg/dL (ref 8.7–10.3)
GFR calc Af Amer: 92 mL/min/{1.73_m2} (ref >59)
GFR calc non Af Amer: 80 mL/min/{1.73_m2} (ref >59)
GLOBULIN, TOTAL: 2.3 g/dL (ref 1.5–4.5)
Glucose: 99 mg/dL (ref 65–99)
POTASSIUM: 4.5 mmol/L (ref 3.5–5.2)
Sodium: 143 mmol/L (ref 134–144)
Total Protein: 6.6 g/dL (ref 6.0–8.5)

## 2017-12-28 LAB — CLOSTRIDIUM DIFFICILE BY PCR: CDIFFPCR: NEGATIVE

## 2018-01-01 ENCOUNTER — Ambulatory Visit: Payer: Medicare Other | Admitting: Internal Medicine

## 2018-01-07 ENCOUNTER — Encounter: Payer: Self-pay | Admitting: Internal Medicine

## 2018-01-07 ENCOUNTER — Ambulatory Visit (INDEPENDENT_AMBULATORY_CARE_PROVIDER_SITE_OTHER): Payer: Medicare Other | Admitting: Internal Medicine

## 2018-01-07 DIAGNOSIS — R0683 Snoring: Secondary | ICD-10-CM | POA: Diagnosis not present

## 2018-01-07 DIAGNOSIS — R197 Diarrhea, unspecified: Secondary | ICD-10-CM

## 2018-01-07 DIAGNOSIS — J449 Chronic obstructive pulmonary disease, unspecified: Secondary | ICD-10-CM

## 2018-01-07 DIAGNOSIS — Z23 Encounter for immunization: Secondary | ICD-10-CM | POA: Diagnosis not present

## 2018-01-07 NOTE — Assessment & Plan Note (Signed)
She has stable COPD without active bronchitis component at this visit.  Room air oxygenation on arrival 98%.  Dyspnea with exertion as expected, but is multifactorial especially given recent weight loss and other health problems.  CXR from 11/11/2017 shows right base change of prior lower lobectomy but is otherwise clear. Plan-given permission to use her nebulizer machine as often as every 4 hours when needed.

## 2018-01-07 NOTE — Patient Instructions (Signed)
Ok to use your nebulizer every 4 hours if needed  Suggest an otc antihistamine like Claritin(loratadine)- non sedating                                                        Benadryl(diphenhydramine)- may be sedating One or the other of these should help with the nasal drainage.  Ask your PCP for more help or GI referral for the ongoing diarrhea and weight loss.   Order Flu vax- senior

## 2018-01-07 NOTE — Progress Notes (Signed)
HPI F former heavy smoker , former scrub nurse followed for  COPD, lung nodule RLL/ Carcinoid/right lower lobectomy, 26mm RUL nodule , Hx PE/ IVC filter, complicated by hx discoid lupus rash, hx bilateral breast Ca lumpectomies, old calcified granulomas PFT 05/16/2012: Mild obstructive airways disease with insignificant response to bronchodilator, normal lung volumes, diffusion mildly reduced. FVC 3.08/96%, FEV1 2.01/86%, FEV1/FVC 0.65. TLC 95%, DLCO 73%. Quant TB Gold assay NEG 05/16/12, CT chest 08/13/12- The nodule also shows no evidence (IR did not do needle bx.) of contrast enhancement. Given all of these factors as well as the  presence of multiple abutting blood vessels which may make the  biopsy of higher risk, decision was made to not perform a percutaneous biopsy V/Q in 2016 intermediate for PE- has filter Office Spirometry 09/10/16-severe obstructive airways disease. FVC 1.91/54%, FEV1 1.31/42%, ratio 0.76, FEF 25-75% 0.50/23%. PFT 01/31/2017-severe obstruction with no response to bronchodilator, moderate diffusion defect.  FVC 1.85/54%, FEV1 1.19/46%, ratio 1.64, TLC 81%, DLCO 55% HST 01/31/17- Neg, AHI 2.5/ hr, desat to 89%, body weight 218 lbs -------------------------------------------------------------------------------------------------------------------- 09/02/2017- 81 yoF  smoker , former scrub nurse followed for  COPD, lung nodule RLL/ Carcinoid/right lower lobectomy, 18mm RUL nodule , Hx PE/ IVC filter, complicated by hx discoid lupus rash, hx bilateral breast Ca lumpectomies, old calcified granulomas ----COPD mixed type:  Working toward smoking cessation- August date. In [facility]. Combivent Respimat, Trelegy, nebulizer albuterol, She continues with pulmonary rehabilitation and thinks she is better off than other participants who are classified as COPD Gold 3.  She would like to update PFT which we can do at next visit.  She is still working on smoking cessation with a quit date in  August.  Describes market dyspnea by the time she reaches the top of her stairs to bedroom and we discussed stopping to rest halfway up and pacing herself.  Denies chest pain or palpitation. CXR 05/02/2017- IMPRESSION: 1. Previous identified left lung infiltrates have cleared. No acute infiltrates noted. 2.  Right base pleural-parenchymal scarring again noted.  01/07/2018- 44 yoF  smoker , former scrub nurse followed for  COPD, lung nodule RLL/ Carcinoid/right lower lobectomy, 75mm RUL nodule , Hx PE/ IVC filter, complicated by hx discoid lupus rash, hx bilateral breast Ca lumpectomies, old calcified granulomas ------Follows for COPD, she had an exacerbation early september and saw TP, she recieved ABX  Then ED for exacerb.  Describes a 2-week interval with fever which is now resolved.  She is not having cough, wheeze or sputum but feels more short of breath with exertion.  Over the last month she has lost weight-she says 19 pounds-with persistent diarrhea.  I reviewed recent labs which are negative. Discussed use of Combivent as a rescue inhaler, or alternatively use her nebulizer. PFT 01/31/2017-severe obstruction with no response to bronchodilator, moderate diffusion defect.  FVC 1.85/54%, FEV1 1.19/46%, ratio 1.64, TLC 81%, DLCO 55% HST 01/31/17- Neg, AHI 2.5/ hr, desat to 89%, body weight 218 lbs CXR 11/11/2017- IMPRESSION: Scarring lateral right base with questionable small right pleural effusion. Lungs elsewhere clear. Stable cardiac silhouette. There is aortic atherosclerosis. Aortic Atherosclerosis (ICD10-I70.0).  ROS-see HPI    + = positive Constitutional:   No-   weight loss, night sweats, fevers, chills, + fatigue, lassitude. HEENT:   No-  headaches, difficulty swallowing, tooth/dental problems, sore throat,       No-  sneezing, itching, ear ache, nasal congestion, post nasal drip,  CV:  chest pain, orthopnea, PND, swelling in lower extremities,  anasarca,  dizziness,  palpitations Resp: +shortness of breath with exertion or at rest.              +cough,  No non-productive cough,  No- coughing up of blood.              No-   change in color of mucus.  + wheezing.   Skin:  rash or lesions. GI:  No-   heartburn, indigestion, abdominal pain, nausea, vomiting,  GU: . MS:  No-   joint pain or swelling. Neuro-     nothing unusual Psych:  No- change in mood or affect. No depression or anxiety.  No memory loss.  OBJ- Physical Exam General- Alert, Oriented, Affect-appropriate, Distress- none acute, +obese,  Skin-  Lymphadenopathy- none Head- atraumatic            Eyes- Gross vision intact, PERRLA, conjunctivae and secretions clear            Ears- Hearing, canals-normal            Nose- Clear, no-Septal dev, mucus, polyps, erosion, perforation             Throat- Mallampati II , mucosa clear , drainage- none, tonsils- atrophic. +Edentulous Neck- flexible , trachea midline, no stridor , thyroid nl, carotid no bruit Chest - symmetrical excursion , unlabored           Heart/CV- RRR , no murmur , no gallop  , no rub, nl s1 s2                           - JVD- none , edema- none, stasis changes- none, varices- none           Lung-clear, cough none, dullness-none, rub- none,             Chest wall-  Abd-  Br/ Gen/ Rectal- Not done, not indicated Extrem- cyanosis- none, clubbing, none, atrophy- none, strength- nl.               + bilateral TKR scars,  Neuro- grossly intact to observation

## 2018-01-07 NOTE — Assessment & Plan Note (Signed)
She describes persistent diarrhea associated with unintentional weight loss.  Initial work-up is unrevealing based on labs available in this chart.  She will return to her primary physician for this problem, and possible GI referral if needed.

## 2018-01-07 NOTE — Assessment & Plan Note (Signed)
She does not have obstructive sleep apnea.  Discussed simple snoring.

## 2018-01-09 ENCOUNTER — Ambulatory Visit (INDEPENDENT_AMBULATORY_CARE_PROVIDER_SITE_OTHER): Payer: Medicare Other | Admitting: Family Medicine

## 2018-01-09 VITALS — BP 122/70 | HR 77 | Temp 98.1°F | Wt 196.6 lb

## 2018-01-09 DIAGNOSIS — J449 Chronic obstructive pulmonary disease, unspecified: Secondary | ICD-10-CM | POA: Diagnosis not present

## 2018-01-09 DIAGNOSIS — R197 Diarrhea, unspecified: Secondary | ICD-10-CM | POA: Diagnosis not present

## 2018-01-09 DIAGNOSIS — Z720 Tobacco use: Secondary | ICD-10-CM

## 2018-01-09 MED ORDER — PREGABALIN 100 MG PO CAPS: 100.0000 mg | ORAL_CAPSULE | Freq: Two times a day (BID) | ORAL | 1 refills | Status: DC

## 2018-01-09 NOTE — Assessment & Plan Note (Signed)
Patient's respiratory status is improved since last visit, per patient. On exam I do not appreciate any wheezes or rhonchi.  She is not dypsnic or tachypnic. She recently saw her pulmonologist who adjusted some of her medication dosages/frequency and she believes this has been helpful to her.

## 2018-01-09 NOTE — Patient Instructions (Addendum)
I am glad to hear that your diarrhea has improved.  You are still having abdominal pain though and your diarrhea is still excessive compared to average.  I would like to refer you to the gastroenterologist for a colonoscopy and EGD.  I would also like you to get a CT scan of your stomach.    I have given you a card for our nutritionist who can help you with choosing high calorie foods to help limit your weight loss. Her name is Iver Nestle and her number is [phone].  You can also try drinking nutritional shakes like Ensure twice a day to make sure you don't become malnurished.    I have refilled your Lyrica as 100mg  twice a day.    Please make a followup appointment in 3 weeks.

## 2018-01-09 NOTE — Progress Notes (Signed)
   [facility] Phone: [phone]   cc: diarrhea, abdominal pain, nausea  Subjective:  Patient is overall feeling better, has more energy and is less short of breath.  Her pulmonologist adjusted some of her medications two days ago and she thinks this will help with her dyspnea.   Patient is still having soreness in her RUQ and it is constant.  It feels worse when she touchies it or when laying on that side. Having nausea with every meal still but not vomiting.  She did vomit once yesterday but had a migraine at that time and attributes it to that. She is Still having diarrhea but now it is with every stool but her frequency has decreased.  Has had 4-5 bowel movements today so far.  Before it was more frequent and larger in volume, so 4-5 by the afternoon is an improvement for her.    ROS: See HPI for pertinent positives and negatives  Past Medical History  Family history reviewed for today's visit. No changes.  Social history- patient is a current smoker, trying to quit.   Objective: BP 122/70   Pulse 77   Temp 98.1 F (36.7 C) (Oral)   Wt 196 lb 9.6 oz (89.2 kg)   LMP 06/04/2011   SpO2 97%   BMI 31.73 kg/m  Gen: NAD, alert and oriented, cooperative with exam HEENT: NCAT, EOMI, MMM CV: normal rate, regular rhythm. No murmurs, no rubs. Resp: LCTAB, no wheezes, crackles. normal work of breathing.  Diminished/absent breath sounds over the right lower lung field, as expected given history.  GI: patient very tender to light palpation over LUQ. Moderately Tender to palpation in other 4 quadrants. Patient had TTP over the right ribs, no masses palpated there. Normal bowel sounds.  Msk: No edema, warm, normal tone, moves UE/LE spontaneously Neuro: CN II-XII grossly intact. no gross deficits Skin: No rashes, no lesions Psych: Appropriate behavior  Assessment/Plan: Diarrhea Patient continues to have diarrhea, although reportedly less than last visit, she is  still having significant frequency and she has lost over 20 lbs since last visit.  C. Diff from last visit was negative.  LFTs were normal from last visit. Patient very tender to palpation in LUQ but also less so in all other quadrants.  Given the chronic nature of her abdominal pain, diarrhea, and nausea, we decided it would be best to place a consult to GI and get an EGD and colonoscopy.  Also ordered a CT abdomen/pelvis with contrast.  I am concerned, given her history of carcinoid tumor, that she might have a recurrence of this.  Will follow up with her in one month.   COPD mixed type ([place]) Patient's respiratory status is improved since last visit, per patient. On exam I do not appreciate any wheezes or rhonchi.  She is not dypsnic or tachypnic. She recently saw her pulmonologist who adjusted some of her medication dosages/frequency and she believes this has been helpful to her.    Tobacco abuse Patient expresses desire to quit smoking.  She states she has the nicotine patches, but can't use gum bc of her dentures, and can't tolerate the lozenge.  She states stress with her roommate keeps her from quitting completely.  Counseled patient on the need to quit smoking and offered our support.     Clemetine Marker, MD PGY-1

## 2018-01-09 NOTE — Assessment & Plan Note (Signed)
Patient expresses desire to quit smoking.  She states she has the nicotine patches, but can't use gum bc of her dentures, and can't tolerate the lozenge.  She states stress with her roommate keeps her from quitting completely.  Counseled patient on the need to quit smoking and offered our support.

## 2018-01-09 NOTE — Assessment & Plan Note (Signed)
Patient continues to have diarrhea, although reportedly less than last visit, she is still having significant frequency and she has lost over 20 lbs since last visit.  C. Diff from last visit was negative.  LFTs were normal from last visit. Patient very tender to palpation in LUQ but also less so in all other quadrants.  Given the chronic nature of her abdominal pain, diarrhea, and nausea, we decided it would be best to place a consult to GI and get an EGD and colonoscopy.  Also ordered a CT abdomen/pelvis with contrast.  I am concerned, given her history of carcinoid tumor, that she might have a recurrence of this.  Will follow up with her in one month.

## 2018-01-17 ENCOUNTER — Ambulatory Visit (HOSPITAL_COMMUNITY): Payer: Medicare Other

## 2018-01-19 ENCOUNTER — Other Ambulatory Visit: Payer: Self-pay | Admitting: Family Medicine

## 2018-01-22 ENCOUNTER — Ambulatory Visit (HOSPITAL_COMMUNITY)
Admission: RE | Admit: 2018-01-22 | Discharge: 2018-01-22 | Disposition: A | Discharge status: Home / Self Care | Payer: Medicare Other | Source: Ambulatory Visit | Attending: Family Medicine | Admitting: Family Medicine

## 2018-01-22 DIAGNOSIS — R197 Diarrhea, unspecified: Secondary | ICD-10-CM | POA: Diagnosis not present

## 2018-01-22 DIAGNOSIS — R109 Unspecified abdominal pain: Secondary | ICD-10-CM | POA: Diagnosis not present

## 2018-01-22 DIAGNOSIS — R110 Nausea: Secondary | ICD-10-CM | POA: Diagnosis not present

## 2018-01-22 DIAGNOSIS — R634 Abnormal weight loss: Secondary | ICD-10-CM | POA: Diagnosis not present

## 2018-01-22 DIAGNOSIS — Z6832 Body mass index (BMI) 32.0-32.9, adult: Secondary | ICD-10-CM | POA: Diagnosis not present

## 2018-01-22 MED ORDER — IOHEXOL 300 MG/ML  SOLN
100.0000 mL | Freq: Once | INTRAMUSCULAR | Status: AC | PRN
  Administered 2018-01-22: 100 mL via INTRAVENOUS

## 2018-01-27 ENCOUNTER — Ambulatory Visit (INDEPENDENT_AMBULATORY_CARE_PROVIDER_SITE_OTHER): Payer: Medicare Other | Admitting: Family Medicine

## 2018-01-27 ENCOUNTER — Encounter: Payer: Self-pay | Admitting: Family Medicine

## 2018-01-27 VITALS — BP 122/60 | HR 112 | Temp 98.7°F | Wt 199.6 lb

## 2018-01-27 DIAGNOSIS — L602 Onychogryphosis: Secondary | ICD-10-CM

## 2018-01-27 DIAGNOSIS — R197 Diarrhea, unspecified: Secondary | ICD-10-CM | POA: Diagnosis not present

## 2018-01-27 DIAGNOSIS — G894 Chronic pain syndrome: Secondary | ICD-10-CM

## 2018-01-27 DIAGNOSIS — R634 Abnormal weight loss: Secondary | ICD-10-CM | POA: Diagnosis not present

## 2018-01-27 MED ORDER — HYDROCODONE-ACETAMINOPHEN 7.5-325 MG PO TABS: 1.0000 | ORAL_TABLET | Freq: Three times a day (TID) | ORAL | 0 refills | Status: DC

## 2018-01-27 NOTE — Patient Instructions (Addendum)
It was great seeing you today! I am sorry you have been having so much trouble with your diarrhea. I think getting a colonscopy and a gi referral is a good idea. I gave you the report for your ct scan of your abdomen. I will also get some lab work to re-evaluate your electrolytes, kidney, liver, and blood counts. I made a referral to a pain management clinic. Expect to hear back from them soon. I gave you a refill of your vicodin.  I also helped trim a large toenail that you had. Please let me know if any questions. Otherwise I will call you with the lab results.

## 2018-01-28 LAB — CBC WITH DIFFERENTIAL/PLATELET
BASOS: 0 %
Basophils Absolute: 0 10*3/uL (ref 0.0–0.2)
EOS (ABSOLUTE): 0.1 10*3/uL (ref 0.0–0.4)
Eos: 1 %
HEMATOCRIT: 40.5 % (ref 34.0–46.6)
Hemoglobin: 13.6 g/dL (ref 11.1–15.9)
Immature Grans (Abs): 0 10*3/uL (ref 0.0–0.1)
Immature Granulocytes: 0 %
LYMPHS ABS: 1.8 10*3/uL (ref 0.7–3.1)
Lymphs: 25 %
MCH: 27.9 pg (ref 26.6–33.0)
MCHC: 33.6 g/dL (ref 31.5–35.7)
MCV: 83 fL (ref 79–97)
MONOS ABS: 0.5 10*3/uL (ref 0.1–0.9)
Monocytes: 7 %
NEUTROS ABS: 4.8 10*3/uL (ref 1.4–7.0)
Neutrophils: 67 %
Platelets: 323 10*3/uL (ref 150–450)
RBC: 4.88 x10E6/uL (ref 3.77–5.28)
RDW: 13.8 % (ref 12.3–15.4)
WBC: 7.2 10*3/uL (ref 3.4–10.8)

## 2018-01-28 LAB — COMPREHENSIVE METABOLIC PANEL
A/G RATIO: 2 (ref 1.2–2.2)
ALBUMIN: 4.5 g/dL (ref 3.5–4.8)
ALK PHOS: 68 IU/L (ref 39–117)
ALT: 11 IU/L (ref 0–32)
AST: 19 IU/L (ref 0–40)
BILIRUBIN TOTAL: 0.2 mg/dL (ref 0.0–1.2)
BUN / CREAT RATIO: 15 (ref 12–28)
BUN: 13 mg/dL (ref 8–27)
CO2: 22 mmol/L (ref 20–29)
Calcium: 9.8 mg/dL (ref 8.7–10.3)
Chloride: 106 mmol/L (ref 96–106)
Creatinine, Ser: 0.84 mg/dL (ref 0.57–1.00)
GFR calc Af Amer: 81 mL/min/{1.73_m2} (ref >59)
GFR calc non Af Amer: 71 mL/min/{1.73_m2} (ref >59)
Globulin, Total: 2.2 g/dL (ref 1.5–4.5)
Glucose: 97 mg/dL (ref 65–99)
POTASSIUM: 4.4 mmol/L (ref 3.5–5.2)
SODIUM: 143 mmol/L (ref 134–144)
Total Protein: 6.7 g/dL (ref 6.0–8.5)

## 2018-01-28 LAB — TSH: TSH: 2.35 u[IU]/mL (ref 0.450–4.500)

## 2018-01-30 DIAGNOSIS — R634 Abnormal weight loss: Secondary | ICD-10-CM | POA: Insufficient documentation

## 2018-01-30 DIAGNOSIS — L602 Onychogryphosis: Secondary | ICD-10-CM | POA: Insufficient documentation

## 2018-01-30 DIAGNOSIS — G894 Chronic pain syndrome: Secondary | ICD-10-CM | POA: Insufficient documentation

## 2018-01-30 NOTE — Assessment & Plan Note (Signed)
Former pain management clinic closed down.  Requesting referral to another 1.  Internal and external referral placed.  Also gave 1 month supply of Percocet.  Reviewed [facility] controlled substance database and patient has not made an appropriate request

## 2018-01-30 NOTE — Assessment & Plan Note (Signed)
Trim toenail.  Encourage patient to perform routine toenail care.  Can place podiatry referral if patient wants.

## 2018-01-30 NOTE — Assessment & Plan Note (Signed)
Unclear etiology.  Patient does have accompanying weight loss, which is stabilized this month.  Imodium intermittently helping.  C. difficile panel negative.  Patient now due for colonoscopy.  On her preference will refer to GI for further work-up.

## 2018-01-30 NOTE — Progress Notes (Signed)
HPI 72 year old female who presents for weight loss eval.  Per patient report she has been unintentionally losing weight for the last couple of months.  She is been having intractable diarrhea around this time.  C. difficile panel was negative.  Patient had CT abdomen with contrast that showed no notable findings.  Looking back in patient chart patient colonoscopy around 5 years ago which showed adenomatous polyp.  Per GI recommendation she was supposed to follow-up with colonoscopy in 5 years.  She is requesting GI eval for her diarrhea as well.  She has tried Imodium which is did not help.  Her diarrhea has been going on for "several months now".   Patient is also interested in referral to pain management clinic.  Her old pain management clinic "closed down".  She is interested in getting referred to [facility] but also interested in going to counseling.  Internal and external referral placed.  She is requesting refill of her Percocet to "bridge the gap".  Patient also has a hypertrophic toenail which she wishes to be removed.  She states it has been becoming "worse more circular in the last couple of months".  It has become to big for normal nail clippers to remove.  Patient does not want referral to podiatry and instead opted to have it removed in clinic.  CC: Weight loss/diarrhea  ROS:   Review of Systems See HPI for ROS.   CC, SH/smoking status, and VS noted  Objective: BP 122/60   Pulse (!) 112   Temp 98.7 F (37.1 C) (Oral)   Wt 199 lb 9.6 oz (90.5 kg)   LMP 06/04/2011   SpO2 96%   BMI 32.22 kg/m  Gen: Pleasant 72 year old female, resting comfortably in bed, no acute distress HEENT: NCAT, EOMI, PERRL  CV: RRR, no murmur Resp: CTAB, no wheezes, non-labored Abd: SNTND, BS present, no guarding or organomegaly Ext: No edema, warm Neuro: Alert and oriented, Speech clear, No gross deficits Left foot: Hypertrophic toenail to second digit.   Assessment and  plan:  Diarrhea Unclear etiology.  Patient does have accompanying weight loss, which is stabilized this month.  Imodium intermittently helping.  C. difficile panel negative.  Patient now due for colonoscopy.  On her preference will refer to GI for further work-up.  Loss of weight Unexplained weight loss and accompanying night sweats certainly concerning.  Lost about 20 pounds in the course of 1 month in September/October.  Unclear how much diarrhea is contributing.  CMP reviewed and no abnormalities.  Will need colonoscopy to rule out further GI findings.  Could consider CT lung given her history of lung cancer, but just saw pulmonologist and I do not feel the etiology is pulmonary.  Patient does have lupus on her diagnosis list.  Unclear if this is contributing in any way.  Chronic pain syndrome Former pain management clinic closed down.  Requesting referral to another 1.  Internal and external referral placed.  Also gave 1 month supply of Percocet.  Reviewed [facility] controlled substance database and patient has not made an appropriate request  Hypertrophic toenail Trim toenail.  Encourage patient to perform routine toenail care.  Can place podiatry referral if patient wants.   Orders Placed This Encounter  Procedures  . CBC with Differential  . Comprehensive metabolic panel    Order Specific Question:   Has the patient fasted?    Answer:   No  . TSH  . Ambulatory referral to Pain Clinic    Referral  Priority:   Routine    Referral Type:   Consultation    Referral Reason:   Specialty Services Required    Requested Specialty:   Pain Medicine    Number of Visits Requested:   1  . Ambulatory referral to Pain Clinic    Referral Priority:   Routine    Referral Type:   Consultation    Referral Reason:   Specialty Services Required    Requested Specialty:   Pain Medicine    Number of Visits Requested:   1  . Ambulatory referral to Gastroenterology    Referral Priority:   Routine     Referral Type:   Consultation    Referral Reason:   Specialty Services Required    Number of Visits Requested:   1    Meds ordered this encounter  Medications  . HYDROcodone-acetaminophen (NORCO) 7.5-325 MG tablet    Sig: Take 1 tablet by mouth 3 (three) times daily.    Dispense:  120 tablet    Refill:  0     Guadalupe Dawn MD PGY-2 Family Medicine Resident  01/30/2018 10:25 AM

## 2018-01-30 NOTE — Assessment & Plan Note (Addendum)
Unexplained weight loss and accompanying night sweats certainly concerning.  Lost about 20 pounds in the course of 1 month in September/October.  Unclear how much diarrhea is contributing.  CMP reviewed and no abnormalities.  Will need colonoscopy to rule out further GI findings.  Could consider CT lung given her history of lung cancer, but just saw pulmonologist and I do not feel the etiology is pulmonary.  Patient does have lupus on her diagnosis list.  Unclear if this is contributing in any way.

## 2018-02-10 ENCOUNTER — Ambulatory Visit: Payer: Medicare Other | Admitting: Family Medicine

## 2018-02-13 DIAGNOSIS — M545 Low back pain: Secondary | ICD-10-CM | POA: Diagnosis not present

## 2018-02-13 DIAGNOSIS — G8929 Other chronic pain: Secondary | ICD-10-CM | POA: Diagnosis not present

## 2018-02-13 DIAGNOSIS — Z79899 Other long term (current) drug therapy: Secondary | ICD-10-CM | POA: Diagnosis not present

## 2018-02-13 DIAGNOSIS — M546 Pain in thoracic spine: Secondary | ICD-10-CM | POA: Diagnosis not present

## 2018-02-13 DIAGNOSIS — L930 Discoid lupus erythematosus: Secondary | ICD-10-CM | POA: Diagnosis not present

## 2018-02-13 DIAGNOSIS — M129 Arthropathy, unspecified: Secondary | ICD-10-CM | POA: Diagnosis not present

## 2018-02-14 ENCOUNTER — Telehealth: Payer: Self-pay

## 2018-02-14 ENCOUNTER — Encounter: Payer: Self-pay | Admitting: Family Medicine

## 2018-02-14 ENCOUNTER — Ambulatory Visit (INDEPENDENT_AMBULATORY_CARE_PROVIDER_SITE_OTHER): Payer: Medicare Other | Admitting: Family Medicine

## 2018-02-14 VITALS — BP 121/80 | HR 94 | Temp 98.4°F | Wt 199.2 lb

## 2018-02-14 DIAGNOSIS — J441 Chronic obstructive pulmonary disease with (acute) exacerbation: Secondary | ICD-10-CM

## 2018-02-14 DIAGNOSIS — R0602 Shortness of breath: Secondary | ICD-10-CM | POA: Diagnosis not present

## 2018-02-14 DIAGNOSIS — L602 Onychogryphosis: Secondary | ICD-10-CM

## 2018-02-14 DIAGNOSIS — Z720 Tobacco use: Secondary | ICD-10-CM

## 2018-02-14 DIAGNOSIS — R197 Diarrhea, unspecified: Secondary | ICD-10-CM

## 2018-02-14 MED ORDER — PREDNISONE 50 MG PO TABS: ORAL_TABLET | ORAL | 0 refills | Status: AC

## 2018-02-14 NOTE — Assessment & Plan Note (Signed)
Tolerated well.  Patient has not picked up file.  Explained patient that if she does not keep it filed down this will recur.  Patient in agreement

## 2018-02-14 NOTE — Progress Notes (Signed)
HPI 73 year old female who presents for diarrhea and shortness of breath.  Patient last seen by me on 01/27/2018.  Went over lab work-up from that visit.  Informed her that TSH, CBC, CMP were all within normal limits.  Patient has not heard from GI regarding appointment for colonoscopy and upper GI endoscopy.  She does state that her diarrhea has improved some since her visit.  She has not been taking Imodium.  Patient states that she is also been having shortness of breath for last couple weeks.  She is becoming tired more easily.  She has noticed wheezing off and on.  She has not been taking her Trelegy inhaler.  Her albuterol does help her quite a bit and she is been taking a lot more frequently.  Has not purchased a nail file.  Has not had any pain from toenail trimming at last appointment.  Patient has had appointment at [facility] pain management clinic.  Enjoyed her visit there and has had no issues.   CC: Shortness of breath, diarrhea   ROS:   Review of Systems See HPI for ROS.   CC, SH/smoking status, and VS noted  Objective: BP 121/80   Pulse 94   Temp 98.4 F (36.9 C) (Oral)   Wt 90.4 kg   LMP 06/04/2011   SpO2 94%   BMI 32.15 kg/m  Gen: Well-appearing Caucasian female, resting comfortably. CV: Regular rate and rhythm, no M/R/G Resp: Patient with diffuse wheezes on deep inspiration, scattered end expiratory wheezes Abd: SNTND, BS present, no guarding or organomegaly Neuro: Alert and oriented, Speech clear, No gross deficits   Assessment and plan:  COPD exacerbation ([place]) Patient COPD managed by pulmonology.  Patient has not been taking her child inhaler as prescribed, and has been using albuterol every 6 hours for the last 2 to 3 weeks.  Strongly encouraged patient to start taking Trelegy inhaler again. Discussed smoking cessation, please see that problem for further discussion.  Will give patient prednisone 50 mg daily for 5 days.  Patient to follow-up pulmonology  when scheduled.  Shortness of breath Likely secondary to COPD exacerbation.  Patient satting well on room air.  Other items on differential are CHF extubation, PE, pneumonia.  If she is exceedingly unlikely given patient's clinical presentation, vital signs, and history.  Will treat his COPD exacerbation.  Prednisone 50 mg daily for 5 days.  Restart taking Trelegy inhaler and albuterol as needed.  Patient does have a history of lung cancer status post resection.  Will get CT chest without contrast.  Reasons for this or shortness of breath coupled with patient's unexplained weight loss and general fatigue for last 2 to 3 months.  Hypertrophic toenail Tolerated well.  Patient has not picked up file.  Explained patient that if she does not keep it filed down this will recur.  Patient in agreement  Tobacco abuse Discuss smoking cessation with patient.  She states she is making good progress but the stress of her everyday life events her from completely quitting.  Patient interested in making contact with 1-800-quit now.  Diarrhea Has improved somewhat since late December visit.  Patient has yet to hear from GI regarding referral.  Explained patient that she is likely already a patient there and that she only has a call schedule appointment.  Patient overdue for colonoscopy given findings from last scope.  Will also be helpful in evaluating cause of diarrhea.   Orders Placed This Encounter  Procedures  . CT Chest Wo  Contrast    Standing Status:   Future    Standing Expiration Date:   04/15/2019    Order Specific Question:   Preferred imaging location?    Answer:   [id number]    Order Specific Question:   Radiology Contrast Protocol - do NOT remove file path    Answer:   [email or web address]    Meds ordered this encounter  Medications  . predniSONE (DELTASONE) 50 MG tablet    Sig: Please take 1 50mg  tablet daily for 5 days    Dispense:  5 tablet    Refill:  0      Guadalupe Dawn MD PGY-2 Family Medicine Resident  02/14/2018 5:44 PM

## 2018-02-14 NOTE — Assessment & Plan Note (Signed)
Patient COPD managed by pulmonology.  Patient has not been taking her child inhaler as prescribed, and has been using albuterol every 6 hours for the last 2 to 3 weeks.  Strongly encouraged patient to start taking Trelegy inhaler again. Discussed smoking cessation, please see that problem for further discussion.  Will give patient prednisone 50 mg daily for 5 days.  Patient to follow-up pulmonology when scheduled.

## 2018-02-14 NOTE — Assessment & Plan Note (Signed)
Discuss smoking cessation with patient.  She states she is making good progress but the stress of her everyday life events her from completely quitting.  Patient interested in making contact with 1-800-quit now.

## 2018-02-14 NOTE — Telephone Encounter (Signed)
Called and informed patient of CT Chest at [facility] 02/24/2018 at 1500 hrs.  Patient is to arrive 15 minutes prior to appointment.  Breanna Dixon, [place]

## 2018-02-14 NOTE — Assessment & Plan Note (Signed)
Has improved somewhat since late December visit.  Patient has yet to hear from GI regarding referral.  Explained patient that she is likely already a patient there and that she only has a call schedule appointment.  Patient overdue for colonoscopy given findings from last scope.  Will also be helpful in evaluating cause of diarrhea.

## 2018-02-14 NOTE — Assessment & Plan Note (Addendum)
Likely secondary to COPD exacerbation.  Patient satting well on room air.  Other items on differential are CHF extubation, PE, pneumonia.  If she is exceedingly unlikely given patient's clinical presentation, vital signs, and history.  Will treat his COPD exacerbation.  Prednisone 50 mg daily for 5 days.  Restart taking Trelegy inhaler and albuterol as needed.  Patient does have a history of lung cancer status post resection.  Will get CT chest without contrast.  Reasons for this or shortness of breath coupled with patient's unexplained weight loss and general fatigue for last 2 to 3 months.

## 2018-02-14 NOTE — Patient Instructions (Addendum)
It was great seeing you again today!  I think your wheezing and shortness of breath is due to a COPD exacerbation.  Strongly encourage you to start taking your Trelegy every day as prescribed.  Take albuterol as needed.  I will give you a 5-day course of steroids to help decrease inflammation.  Given your history of lung cancer and your overall symptomatology I would like to get a CT scan of your chest to rule out any intra-thoracic process.  Regarding her diarrhea and whether this is improved somewhat.  I think getting a colonoscopy with GI is a good idea.  Looks like they already approved you for referral.  I will call check on the status of this order you may also call and see.  I think you can just call and get appointment scheduled.  We also discussed smoking cessation.  Continue to work hard on this as I think this is the most important thing of your help.

## 2018-02-17 ENCOUNTER — Other Ambulatory Visit: Payer: Self-pay | Admitting: Family Medicine

## 2018-02-17 DIAGNOSIS — M546 Pain in thoracic spine: Secondary | ICD-10-CM | POA: Diagnosis not present

## 2018-02-17 DIAGNOSIS — M545 Low back pain: Secondary | ICD-10-CM | POA: Diagnosis not present

## 2018-02-18 ENCOUNTER — Encounter: Payer: Self-pay | Admitting: Gastroenterology

## 2018-02-19 ENCOUNTER — Telehealth: Payer: Self-pay | Admitting: Family Medicine

## 2018-02-19 DIAGNOSIS — R0602 Shortness of breath: Secondary | ICD-10-CM

## 2018-02-19 NOTE — Telephone Encounter (Signed)
I had already placed an order for CT chest w/o contrast at last clinic appointment. She has an appointment on 02/24/2018 at [facility] at [facility]. I replaced order just to make sure there are no issues.  Guadalupe Dawn MD PGY-2 Family Medicine Resident

## 2018-02-19 NOTE — Telephone Encounter (Signed)
Not sure why she is requesting this again because she told me to call her son and leave the message on his machine for her.  I did that but never heard anything from her so I assumed all was good.  I will call her to see what happened.  Thanks  .Ozella Almond, CMA

## 2018-02-19 NOTE — Telephone Encounter (Signed)
Pt called requesting an order for chest cat scan. Stated that she called radiology already and the best phone number to call her is [phone]. ad

## 2018-02-21 DIAGNOSIS — M47817 Spondylosis without myelopathy or radiculopathy, lumbosacral region: Secondary | ICD-10-CM | POA: Diagnosis not present

## 2018-02-21 DIAGNOSIS — G43909 Migraine, unspecified, not intractable, without status migrainosus: Secondary | ICD-10-CM | POA: Diagnosis not present

## 2018-02-21 DIAGNOSIS — G8929 Other chronic pain: Secondary | ICD-10-CM | POA: Diagnosis not present

## 2018-02-21 DIAGNOSIS — M545 Low back pain: Secondary | ICD-10-CM | POA: Diagnosis not present

## 2018-02-22 ENCOUNTER — Other Ambulatory Visit: Payer: Self-pay | Admitting: Family Medicine

## 2018-02-24 ENCOUNTER — Ambulatory Visit (HOSPITAL_COMMUNITY): Admission: RE | Admit: 2018-02-24 | Payer: Medicare Other | Source: Ambulatory Visit

## 2018-02-24 ENCOUNTER — Other Ambulatory Visit: Payer: Self-pay | Admitting: Family Medicine

## 2018-02-24 DIAGNOSIS — G43909 Migraine, unspecified, not intractable, without status migrainosus: Secondary | ICD-10-CM

## 2018-02-25 ENCOUNTER — Other Ambulatory Visit: Payer: Self-pay | Admitting: Family Medicine

## 2018-02-25 ENCOUNTER — Ambulatory Visit (INDEPENDENT_AMBULATORY_CARE_PROVIDER_SITE_OTHER): Payer: Medicare Other | Admitting: Family Medicine

## 2018-02-25 VITALS — BP 105/80 | HR 82 | Temp 98.4°F | Wt 197.6 lb

## 2018-02-25 DIAGNOSIS — J8410 Pulmonary fibrosis, unspecified: Secondary | ICD-10-CM | POA: Diagnosis not present

## 2018-02-25 DIAGNOSIS — R197 Diarrhea, unspecified: Secondary | ICD-10-CM | POA: Diagnosis not present

## 2018-02-25 DIAGNOSIS — M47816 Spondylosis without myelopathy or radiculopathy, lumbar region: Secondary | ICD-10-CM | POA: Insufficient documentation

## 2018-02-25 DIAGNOSIS — M5136 Other intervertebral disc degeneration, lumbar region: Secondary | ICD-10-CM | POA: Diagnosis not present

## 2018-02-25 NOTE — Patient Instructions (Signed)
It was great seeing you again today!  Today we scheduled her CT chest appointment for Friday 1/24 at 3:30 PM.  Looks like you also have a colonoscopy scheduled, so please keep that.  Regarding your CT head, this was ordered by [facility].  Regarding scheduling please call them and confirm the time.  In regards to your degenerative disc disease and spondylosis of your lumbar spine, I think going back to your previous surgeon Dr. Cathren Laine is a good idea.  The address is [address], [place], [place] [address] Phone number: [phone] It looks like he is now with [facility]. Please let me know if they require a referral when you call to schedule an appointment.

## 2018-02-26 ENCOUNTER — Other Ambulatory Visit (HOSPITAL_COMMUNITY): Payer: Self-pay | Admitting: Family Medicine

## 2018-02-26 DIAGNOSIS — G43909 Migraine, unspecified, not intractable, without status migrainosus: Secondary | ICD-10-CM

## 2018-02-27 ENCOUNTER — Other Ambulatory Visit: Payer: Self-pay | Admitting: Family Medicine

## 2018-02-28 ENCOUNTER — Ambulatory Visit (HOSPITAL_COMMUNITY)
Admission: RE | Admit: 2018-02-28 | Discharge: 2018-02-28 | Disposition: A | Discharge status: Home / Self Care | Payer: Medicare Other | Source: Ambulatory Visit | Attending: Family Medicine | Admitting: Family Medicine

## 2018-02-28 ENCOUNTER — Ambulatory Visit (HOSPITAL_COMMUNITY): Payer: Medicare Other

## 2018-02-28 ENCOUNTER — Encounter (HOSPITAL_COMMUNITY): Payer: Self-pay

## 2018-02-28 ENCOUNTER — Encounter: Payer: Self-pay | Admitting: Family Medicine

## 2018-02-28 DIAGNOSIS — R0602 Shortness of breath: Secondary | ICD-10-CM | POA: Diagnosis not present

## 2018-02-28 DIAGNOSIS — R918 Other nonspecific abnormal finding of lung field: Secondary | ICD-10-CM | POA: Diagnosis not present

## 2018-02-28 DIAGNOSIS — G43909 Migraine, unspecified, not intractable, without status migrainosus: Secondary | ICD-10-CM | POA: Diagnosis not present

## 2018-02-28 DIAGNOSIS — R51 Headache: Secondary | ICD-10-CM | POA: Diagnosis not present

## 2018-02-28 NOTE — Assessment & Plan Note (Signed)
Patient with some increased shortness of breath over the last few months.  In process of working up weight loss, will get chest CT scan to rule out malignancy.  Likely her shortness of breath is secondary to her known granulomatous lung disease.  Pending results will work-up appropriately.

## 2018-02-28 NOTE — Assessment & Plan Note (Signed)
Has improved a lot since last clinic visit.  Patient has colonoscopy scheduled with GI in early February.  Advised patient to make sure and keep this appointment.  Patient's weight loss has slowed down significantly and stabilized since last clinic appointment.  If chest CT and colonoscopy negative for malignancy, can consider effectively ruled out.

## 2018-02-28 NOTE — Assessment & Plan Note (Signed)
Gave updated phone number to her spinal surgeon Dr. Cathren Laine.  She is to call and schedule follow-up.  Lumbar x-ray showing multilevel degenerative disc disease and spondylosis scanned in.

## 2018-02-28 NOTE — Progress Notes (Signed)
   HPI 73 year old female who presents for CT scan follow-up.  Unfortunately patient did not have CT scan prior to clinic appointment.  Helped patient schedule her CT chest.  Patient also with questions about a CT scan of her head ordered by her pain management clinic.  Unfortunately unable to help as this was scheduled by another provider.  Patient has GI follow-up in early February.  She has been having chronic diarrhea for the last 3 to 4 months.  Palpation of her last colonoscopy she was overdue for repeat colonoscopy due to adenomatous polyp found 5 years ago.  Patient brought lumbar spine x-rays taken at her pain management clinic office.  Showed multilevel lumbar  spondylosis and degenerative disc disease.  She stated that her surgeon Dr. Cathren Laine wanted her to follow-up every 10 years.  Is been exactly 10 years since her lumbar spine fusion.   CC: Chest CT, diarrhea, x-rays   ROS:   Review of Systems See HPI for ROS.   CC, SH/smoking status, and VS noted  Objective: BP 105/80   Pulse 82   Temp 98.4 F (36.9 C) (Oral)   Wt 197 lb 9.6 oz (89.6 kg)   LMP 06/04/2011   SpO2 97%   BMI 31.89 kg/m  Gen: 73 year old Caucasian female, resting comfortably, no acute distress CV: RRR, no murmur Resp: lungs clear to auscultation bilaterally, no acute distress Abd: SNTND, BS present, no guarding or organomegaly Neuro: Alert and oriented, Speech clear, No gross deficits   Assessment and plan:  Lumbar spondylosis Gave updated phone number to her spinal surgeon Dr. Cathren Laine.  She is to call and schedule follow-up.  Lumbar x-ray showing multilevel degenerative disc disease and spondylosis scanned in.  Diarrhea Has improved a lot since last clinic visit.  Patient has colonoscopy scheduled with GI in early February.  Advised patient to make sure and keep this appointment.  Patient's weight loss has slowed down significantly and stabilized since last clinic appointment.  If chest CT and  colonoscopy negative for malignancy, can consider effectively ruled out.  Granulomatous lung disease Patient with some increased shortness of breath over the last few months.  In process of working up weight loss, will get chest CT scan to rule out malignancy.  Likely her shortness of breath is secondary to her known granulomatous lung disease.  Pending results will work-up appropriately.   No orders of the defined types were placed in this encounter.   No orders of the defined types were placed in this encounter.    Guadalupe Dawn MD PGY-2 Family Medicine Resident  02/28/2018 11:10 PM

## 2018-03-06 ENCOUNTER — Other Ambulatory Visit: Payer: Self-pay | Admitting: Family Medicine

## 2018-03-07 ENCOUNTER — Telehealth: Payer: Self-pay

## 2018-03-07 NOTE — Telephone Encounter (Signed)
Fax from pharmacy which states Metoprolol was last filled there on 10/24/17 for a 90 day supply. Please send.  Danley Danker, RN [facility] RN)

## 2018-03-11 ENCOUNTER — Encounter: Payer: Self-pay | Admitting: Family Medicine

## 2018-03-11 ENCOUNTER — Other Ambulatory Visit: Payer: Self-pay | Admitting: Family Medicine

## 2018-03-11 MED ORDER — METOPROLOL SUCCINATE ER 100 MG PO TB24: 100.0000 mg | ORAL_TABLET | Freq: Every day | ORAL | 1 refills | Status: AC

## 2018-03-11 NOTE — Progress Notes (Signed)
Sent in prescription for metoprolol succinate 100 mg daily.  Updated medication list to reflect that she is taking this.  Guadalupe Dawn MD PGY-2 Family Medicine Resident

## 2018-03-14 ENCOUNTER — Ambulatory Visit (AMBULATORY_SURGERY_CENTER): Payer: Self-pay | Admitting: *Deleted

## 2018-03-14 VITALS — Ht 67.0 in | Wt 198.0 lb

## 2018-03-14 DIAGNOSIS — Z86010 Personal history of colonic polyps: Secondary | ICD-10-CM

## 2018-03-14 MED ORDER — NA SULFATE-K SULFATE-MG SULF 17.5-3.13-1.6 GM/177ML PO SOLN: ORAL | 0 refills | Status: DC

## 2018-03-14 NOTE — Progress Notes (Signed)
Patient denies any allergies to eggs or soy. Patient denies any problems with anesthesia/sedation. Patient denies any oxygen use at home. Patient denies taking any diet/weight loss medications or blood thinners.  

## 2018-03-19 DIAGNOSIS — M4722 Other spondylosis with radiculopathy, cervical region: Secondary | ICD-10-CM | POA: Diagnosis not present

## 2018-03-19 DIAGNOSIS — M48062 Spinal stenosis, lumbar region with neurogenic claudication: Secondary | ICD-10-CM | POA: Diagnosis not present

## 2018-03-21 DIAGNOSIS — M47817 Spondylosis without myelopathy or radiculopathy, lumbosacral region: Secondary | ICD-10-CM | POA: Diagnosis not present

## 2018-03-21 DIAGNOSIS — Z79899 Other long term (current) drug therapy: Secondary | ICD-10-CM | POA: Diagnosis not present

## 2018-03-21 DIAGNOSIS — G8929 Other chronic pain: Secondary | ICD-10-CM | POA: Diagnosis not present

## 2018-03-21 DIAGNOSIS — M545 Low back pain: Secondary | ICD-10-CM | POA: Diagnosis not present

## 2018-03-21 DIAGNOSIS — M797 Fibromyalgia: Secondary | ICD-10-CM | POA: Diagnosis not present

## 2018-03-27 ENCOUNTER — Ambulatory Visit (AMBULATORY_SURGERY_CENTER): Payer: Medicare Other | Admitting: Gastroenterology

## 2018-03-27 ENCOUNTER — Encounter: Payer: Self-pay | Admitting: Gastroenterology

## 2018-03-27 VITALS — BP 135/43 | HR 65 | Temp 97.7°F | Resp 15 | Ht 67.0 in | Wt 179.0 lb

## 2018-03-27 DIAGNOSIS — K635 Polyp of colon: Secondary | ICD-10-CM

## 2018-03-27 DIAGNOSIS — Z860100 Personal history of colon polyps, unspecified: Secondary | ICD-10-CM

## 2018-03-27 DIAGNOSIS — D123 Benign neoplasm of transverse colon: Secondary | ICD-10-CM | POA: Diagnosis not present

## 2018-03-27 MED ORDER — SODIUM CHLORIDE 0.9 % IV SOLN: 500.0000 mL | INTRAVENOUS | Status: DC

## 2018-03-27 NOTE — Progress Notes (Signed)
Pt's states no medical or surgical changes since previsit or office visit. 

## 2018-03-27 NOTE — Patient Instructions (Signed)
Handout given for polyps.  YOU HAD AN ENDOSCOPIC PROCEDURE TODAY AT [facility]:   Refer to the procedure report that was given to you for any specific questions about what was found during the examination.  If the procedure report does not answer your questions, please call your gastroenterologist to clarify.  If you requested that your care partner not be given the details of your procedure findings, then the procedure report has been included in a sealed envelope for you to review at your convenience later.  YOU SHOULD EXPECT: Some feelings of bloating in the abdomen. Passage of more gas than usual.  Walking can help get rid of the air that was put into your GI tract during the procedure and reduce the bloating. If you had a lower endoscopy (such as a colonoscopy or flexible sigmoidoscopy) you may notice spotting of blood in your stool or on the toilet paper. If you underwent a bowel prep for your procedure, you may not have a normal bowel movement for a few days.  Please Note:  You might notice some irritation and congestion in your nose or some drainage.  This is from the oxygen used during your procedure.  There is no need for concern and it should clear up in a day or so.  SYMPTOMS TO REPORT IMMEDIATELY:   Following lower endoscopy (colonoscopy or flexible sigmoidoscopy):  Excessive amounts of blood in the stool  Significant tenderness or worsening of abdominal pains  Swelling of the abdomen that is new, acute  Fever of 100F or higher  For urgent or emergent issues, a gastroenterologist can be reached at any hour by calling [phone].   DIET:  We do recommend a small meal at first, but then you may proceed to your regular diet.  Drink plenty of fluids but you should avoid alcoholic beverages for 24 hours.  ACTIVITY:  You should plan to take it easy for the rest of today and you should NOT DRIVE or use heavy machinery until tomorrow (because of the sedation  medicines used during the test).    FOLLOW UP: Our staff will call the number listed on your records the next business day following your procedure to check on you and address any questions or concerns that you may have regarding the information given to you following your procedure. If we do not reach you, we will leave a message.  However, if you are feeling well and you are not experiencing any problems, there is no need to return our call.  We will assume that you have returned to your regular daily activities without incident.  If any biopsies were taken you will be contacted by phone or by letter within the next 1-3 weeks.  Please call us at [phone] if you have not heard about the biopsies in 3 weeks.    SIGNATURES/CONFIDENTIALITY: You and/or your care partner have signed paperwork which will be entered into your electronic medical record.  These signatures attest to the fact that that the information above on your After Visit Summary has been reviewed and is understood.  Full responsibility of the confidentiality of this discharge information lies with you and/or your care-partner.

## 2018-03-27 NOTE — Progress Notes (Signed)
Report to PACU, RN, vss, BBS= Clear.  

## 2018-03-27 NOTE — Op Note (Signed)
[facility] Patient Name: Breanna Dixon Procedure Date: 03/27/2018 11:18 AM MRN: [id number] Endoscopist: Remo Lipps P. Havery Moros , MD Age: 73 Referring MD:  Date of Birth: 08/12/1945 Gender: Female Account #: [id number] Procedure:                Colonoscopy Indications:              Surveillance: Personal history of adenomatous                            polyps on last colonoscopy 5 years ago Medicines:                Monitored Anesthesia Care Procedure:                Pre-Anesthesia Assessment:                           - Prior to the procedure, a History and Physical                            was performed, and patient medications and                            allergies were reviewed. The patient's tolerance of                            previous anesthesia was also reviewed. The risks                            and benefits of the procedure and the sedation                            options and risks were discussed with the patient.                            All questions were answered, and informed consent                            was obtained. Prior Anticoagulants: The patient has                            taken no previous anticoagulant or antiplatelet                            agents. ASA Grade Assessment: III - A patient with                            severe systemic disease. After reviewing the risks                            and benefits, the patient was deemed in                            satisfactory condition to undergo the procedure.  After obtaining informed consent, the colonoscope                            was passed under direct vision. Throughout the                            procedure, the patient's blood pressure, pulse, and                            oxygen saturations were monitored continuously. The                            Colonoscope was introduced through the anus and                            advanced to the  the cecum, identified by                            appendiceal orifice and ileocecal valve. The                            colonoscopy was performed without difficulty. The                            patient tolerated the procedure well. The quality                            of the bowel preparation was good. The ileocecal                            valve, appendiceal orifice, and rectum were                            photographed. Scope In: 11:24:54 AM Scope Out: 11:42:02 AM Scope Withdrawal Time: 0 hours 10 minutes 8 seconds  Total Procedure Duration: 0 hours 17 minutes 8 seconds  Findings:                 The perianal and digital rectal examinations were                            normal.                           Two sessile polyps were found in the transverse                            colon. The polyps were 3 to 7 mm in size. These                            polyps were removed with a cold snare. Resection                            and retrieval were complete.  A diminutive polyp was found in the hepatic                            flexure. The polyp was flat. The polyp was removed                            with a cold snare. Resection and retrieval were                            complete.                           The exam was otherwise without abnormality. Complications:            No immediate complications. Estimated blood loss:                            Minimal. Estimated Blood Loss:     Estimated blood loss was minimal. Impression:               - Two 3 to 7 mm polyps in the transverse colon,                            removed with a cold snare. Resected and retrieved.                           - One diminutive polyp at the hepatic flexure,                            removed with a cold snare. Resected and retrieved.                           - The examination was otherwise normal. Recommendation:           - Patient has a contact number  available for                            emergencies. The signs and symptoms of potential                            delayed complications were discussed with the                            patient. Return to normal activities tomorrow.                            Written discharge instructions were provided to the                            patient.                           - Resume previous diet.                           - Await pathology  results. Remo Lipps P. Havery Moros, MD 03/27/2018 11:45:33 AM This report has been signed electronically.

## 2018-03-27 NOTE — Progress Notes (Signed)
Called to room to assist during endoscopic procedure.  Patient ID and intended procedure confirmed with present staff. Received instructions for my participation in the procedure from the performing physician.  

## 2018-03-28 ENCOUNTER — Telehealth: Payer: Self-pay

## 2018-03-28 NOTE — Telephone Encounter (Signed)
First post procedure follow up call no answer

## 2018-04-02 ENCOUNTER — Telehealth: Payer: Self-pay | Admitting: *Deleted

## 2018-04-02 ENCOUNTER — Other Ambulatory Visit: Payer: Self-pay | Admitting: Family Medicine

## 2018-04-02 NOTE — Telephone Encounter (Signed)
Patient calls to request that we schedule the MRI's that her spinal surgeon wants her to have so she can have it done in [place].  Advised to call her spinal surgeon and ask them to call and schedule the appt 2 cone.  She will try this and call us back with issues. Alzada Brazee, Salome Spotted, CMA

## 2018-04-03 ENCOUNTER — Other Ambulatory Visit: Payer: Self-pay | Admitting: Family Medicine

## 2018-04-03 DIAGNOSIS — M4722 Other spondylosis with radiculopathy, cervical region: Secondary | ICD-10-CM

## 2018-04-07 ENCOUNTER — Other Ambulatory Visit: Payer: Self-pay | Admitting: Family Medicine

## 2018-04-08 ENCOUNTER — Other Ambulatory Visit (HOSPITAL_COMMUNITY): Payer: Self-pay | Admitting: Family Medicine

## 2018-04-08 DIAGNOSIS — M4722 Other spondylosis with radiculopathy, cervical region: Secondary | ICD-10-CM

## 2018-04-18 DIAGNOSIS — Z79899 Other long term (current) drug therapy: Secondary | ICD-10-CM | POA: Diagnosis not present

## 2018-04-18 DIAGNOSIS — M5136 Other intervertebral disc degeneration, lumbar region: Secondary | ICD-10-CM | POA: Diagnosis not present

## 2018-04-18 DIAGNOSIS — M797 Fibromyalgia: Secondary | ICD-10-CM | POA: Diagnosis not present

## 2018-04-18 DIAGNOSIS — M47814 Spondylosis without myelopathy or radiculopathy, thoracic region: Secondary | ICD-10-CM | POA: Diagnosis not present

## 2018-04-23 ENCOUNTER — Ambulatory Visit (HOSPITAL_COMMUNITY): Payer: Medicare Other

## 2018-04-23 ENCOUNTER — Encounter (HOSPITAL_COMMUNITY): Payer: Self-pay

## 2018-04-27 ENCOUNTER — Other Ambulatory Visit: Payer: Self-pay | Admitting: Family Medicine

## 2018-05-19 ENCOUNTER — Other Ambulatory Visit: Payer: Self-pay | Admitting: Family Medicine

## 2018-05-22 DIAGNOSIS — M62830 Muscle spasm of back: Secondary | ICD-10-CM | POA: Diagnosis not present

## 2018-05-22 DIAGNOSIS — M47814 Spondylosis without myelopathy or radiculopathy, thoracic region: Secondary | ICD-10-CM | POA: Diagnosis not present

## 2018-05-22 DIAGNOSIS — M5136 Other intervertebral disc degeneration, lumbar region: Secondary | ICD-10-CM | POA: Diagnosis not present

## 2018-05-22 DIAGNOSIS — Z79899 Other long term (current) drug therapy: Secondary | ICD-10-CM | POA: Diagnosis not present

## 2018-05-22 DIAGNOSIS — M797 Fibromyalgia: Secondary | ICD-10-CM | POA: Diagnosis not present

## 2018-05-25 ENCOUNTER — Other Ambulatory Visit: Payer: Self-pay | Admitting: Family Medicine

## 2018-06-05 ENCOUNTER — Telehealth: Payer: Self-pay

## 2018-06-05 NOTE — Telephone Encounter (Signed)
Pt calling because her hand has broken out for 3 days now. Cream is not working thinks she needs steroid like in the past. Please send to [place] on [address]. Please call [phone] if you have questions. Ottis Stain, CMA

## 2018-06-09 MED ORDER — TRIAMCINOLONE ACETONIDE 0.1 % EX OINT: 1.0000 "application " | TOPICAL_OINTMENT | Freq: Two times a day (BID) | CUTANEOUS | 0 refills | Status: DC

## 2018-06-09 NOTE — Addendum Note (Signed)
Addended by: Pauletta Browns on: 06/09/2018 08:18 AM   Modules accepted: Orders

## 2018-06-09 NOTE — Telephone Encounter (Signed)
Re-ordered the triamcinolone cream. If patient thinks she needs oral steroids, then will need to be seen either in person or through telemedicine.  -Guadalupe Dawn MD PGY-2 Family Medicine Resident

## 2018-06-20 DIAGNOSIS — M47814 Spondylosis without myelopathy or radiculopathy, thoracic region: Secondary | ICD-10-CM | POA: Diagnosis not present

## 2018-06-20 DIAGNOSIS — M5136 Other intervertebral disc degeneration, lumbar region: Secondary | ICD-10-CM | POA: Diagnosis not present

## 2018-06-20 DIAGNOSIS — M797 Fibromyalgia: Secondary | ICD-10-CM | POA: Diagnosis not present

## 2018-06-20 DIAGNOSIS — M62830 Muscle spasm of back: Secondary | ICD-10-CM | POA: Diagnosis not present

## 2018-06-22 ENCOUNTER — Other Ambulatory Visit: Payer: Self-pay | Admitting: Family Medicine

## 2018-06-29 ENCOUNTER — Other Ambulatory Visit: Payer: Self-pay | Admitting: Family Medicine

## 2018-07-01 DIAGNOSIS — Z96653 Presence of artificial knee joint, bilateral: Secondary | ICD-10-CM | POA: Diagnosis not present

## 2018-07-01 DIAGNOSIS — S83412A Sprain of medial collateral ligament of left knee, initial encounter: Secondary | ICD-10-CM | POA: Diagnosis not present

## 2018-07-10 ENCOUNTER — Ambulatory Visit: Payer: Medicare Other | Admitting: Internal Medicine

## 2018-07-31 DIAGNOSIS — M62830 Muscle spasm of back: Secondary | ICD-10-CM | POA: Diagnosis not present

## 2018-07-31 DIAGNOSIS — M129 Arthropathy, unspecified: Secondary | ICD-10-CM | POA: Diagnosis not present

## 2018-07-31 DIAGNOSIS — M47814 Spondylosis without myelopathy or radiculopathy, thoracic region: Secondary | ICD-10-CM | POA: Diagnosis not present

## 2018-07-31 DIAGNOSIS — Z1159 Encounter for screening for other viral diseases: Secondary | ICD-10-CM | POA: Diagnosis not present

## 2018-07-31 DIAGNOSIS — E559 Vitamin D deficiency, unspecified: Secondary | ICD-10-CM | POA: Diagnosis not present

## 2018-07-31 DIAGNOSIS — M797 Fibromyalgia: Secondary | ICD-10-CM | POA: Diagnosis not present

## 2018-07-31 DIAGNOSIS — Z79899 Other long term (current) drug therapy: Secondary | ICD-10-CM | POA: Diagnosis not present

## 2018-07-31 DIAGNOSIS — E7800 Pure hypercholesterolemia, unspecified: Secondary | ICD-10-CM | POA: Diagnosis not present

## 2018-07-31 DIAGNOSIS — M5136 Other intervertebral disc degeneration, lumbar region: Secondary | ICD-10-CM | POA: Diagnosis not present

## 2018-08-10 ENCOUNTER — Other Ambulatory Visit: Payer: Self-pay | Admitting: Family Medicine

## 2018-08-15 ENCOUNTER — Other Ambulatory Visit: Payer: Self-pay | Admitting: Family Medicine

## 2018-08-15 ENCOUNTER — Other Ambulatory Visit: Payer: Self-pay | Admitting: Internal Medicine

## 2018-08-29 DIAGNOSIS — Z1159 Encounter for screening for other viral diseases: Secondary | ICD-10-CM | POA: Diagnosis not present

## 2018-08-29 DIAGNOSIS — M62830 Muscle spasm of back: Secondary | ICD-10-CM | POA: Diagnosis not present

## 2018-08-29 DIAGNOSIS — M5136 Other intervertebral disc degeneration, lumbar region: Secondary | ICD-10-CM | POA: Diagnosis not present

## 2018-08-29 DIAGNOSIS — Z79899 Other long term (current) drug therapy: Secondary | ICD-10-CM | POA: Diagnosis not present

## 2018-08-29 DIAGNOSIS — L930 Discoid lupus erythematosus: Secondary | ICD-10-CM | POA: Diagnosis not present

## 2018-08-29 DIAGNOSIS — M797 Fibromyalgia: Secondary | ICD-10-CM | POA: Diagnosis not present

## 2018-08-29 DIAGNOSIS — M47814 Spondylosis without myelopathy or radiculopathy, thoracic region: Secondary | ICD-10-CM | POA: Diagnosis not present

## 2018-09-09 DIAGNOSIS — R350 Frequency of micturition: Secondary | ICD-10-CM | POA: Diagnosis not present

## 2018-09-15 ENCOUNTER — Other Ambulatory Visit: Payer: Self-pay | Admitting: Family Medicine

## 2018-09-16 DIAGNOSIS — E7800 Pure hypercholesterolemia, unspecified: Secondary | ICD-10-CM | POA: Diagnosis not present

## 2018-09-16 DIAGNOSIS — Z1159 Encounter for screening for other viral diseases: Secondary | ICD-10-CM | POA: Diagnosis not present

## 2018-09-16 DIAGNOSIS — Z0000 Encounter for general adult medical examination without abnormal findings: Secondary | ICD-10-CM | POA: Diagnosis not present

## 2018-09-16 DIAGNOSIS — R350 Frequency of micturition: Secondary | ICD-10-CM | POA: Diagnosis not present

## 2018-09-16 DIAGNOSIS — N951 Menopausal and female climacteric states: Secondary | ICD-10-CM | POA: Diagnosis not present

## 2018-09-16 DIAGNOSIS — Z79899 Other long term (current) drug therapy: Secondary | ICD-10-CM | POA: Diagnosis not present

## 2018-09-16 DIAGNOSIS — R5383 Other fatigue: Secondary | ICD-10-CM | POA: Diagnosis not present

## 2018-09-16 DIAGNOSIS — M129 Arthropathy, unspecified: Secondary | ICD-10-CM | POA: Diagnosis not present

## 2018-09-16 DIAGNOSIS — E559 Vitamin D deficiency, unspecified: Secondary | ICD-10-CM | POA: Diagnosis not present

## 2018-09-16 DIAGNOSIS — R05 Cough: Secondary | ICD-10-CM | POA: Diagnosis not present

## 2018-09-16 DIAGNOSIS — Z131 Encounter for screening for diabetes mellitus: Secondary | ICD-10-CM | POA: Diagnosis not present

## 2018-09-16 DIAGNOSIS — R0602 Shortness of breath: Secondary | ICD-10-CM | POA: Diagnosis not present

## 2018-09-23 ENCOUNTER — Telehealth: Payer: Self-pay | Admitting: Internal Medicine

## 2018-09-23 DIAGNOSIS — J449 Chronic obstructive pulmonary disease, unspecified: Secondary | ICD-10-CM

## 2018-09-23 NOTE — Telephone Encounter (Signed)
Returned call to patient and advised of Dr. Janee Morn order for PFT as soon as possible.  Also, discussed with patient the covid testing required and that our office staff would be contacting her to coordinate the covid testing prior to the PFT.  Patient acknowledged understanding.  Nothing further needed.  Routed to [employer] for scheduling.   Order placed for PFT as soon as possible.

## 2018-09-23 NOTE — Telephone Encounter (Signed)
Please order PFT as soon as able,   Dx COPD mixed type

## 2018-09-23 NOTE — Telephone Encounter (Signed)
Returned call to patient and she explained that she had an appt at [facility] last week and had a CXR at [facility] on [address] about one week ago. Dr. Simona Huh reviewed CXR and told her she needed to get a PFT as soon as possible.  Patient has a follow up appt with Dr. Leilani Merl on 09/30/18 and a routine appt with Dr. Annamaria Boots on 10/23/18.  Patient states the humidity bothers her breathing but she has basically been homebound due to covid and is now starting pulmonary rehab.   Explained to patient we could not schedule PFT without an order and would have Dr. Annamaria Boots review this information and call her back once we have his recommendations.    LOV Dr. Annamaria Boots 01/07/18 assessment/plan: She has stable COPD without active bronchitis component at this visit.  Room air oxygenation on arrival 98%.  Dyspnea with exertion as expected, but is multifactorial especially given recent weight loss and other health problems.  CXR from 11/11/2017 shows right base change of prior lower lobectomy but is otherwise clear. Plan-given permission to use her nebulizer machine as often as every 4 hours when needed.  Route to Dr. Annamaria Boots for review/advice

## 2018-09-25 NOTE — Telephone Encounter (Signed)
Called and advised PFT scheduled at 1pm -pr

## 2018-09-28 ENCOUNTER — Other Ambulatory Visit: Payer: Self-pay | Admitting: Family Medicine

## 2018-09-30 DIAGNOSIS — M47814 Spondylosis without myelopathy or radiculopathy, thoracic region: Secondary | ICD-10-CM | POA: Diagnosis not present

## 2018-09-30 DIAGNOSIS — M62830 Muscle spasm of back: Secondary | ICD-10-CM | POA: Diagnosis not present

## 2018-09-30 DIAGNOSIS — M5136 Other intervertebral disc degeneration, lumbar region: Secondary | ICD-10-CM | POA: Diagnosis not present

## 2018-09-30 DIAGNOSIS — Z79899 Other long term (current) drug therapy: Secondary | ICD-10-CM | POA: Diagnosis not present

## 2018-09-30 DIAGNOSIS — M797 Fibromyalgia: Secondary | ICD-10-CM | POA: Diagnosis not present

## 2018-10-14 ENCOUNTER — Other Ambulatory Visit: Payer: Self-pay | Admitting: Family Medicine

## 2018-10-16 ENCOUNTER — Other Ambulatory Visit: Payer: Self-pay | Admitting: Internal Medicine

## 2018-10-20 ENCOUNTER — Other Ambulatory Visit (HOSPITAL_COMMUNITY)
Admission: RE | Admit: 2018-10-20 | Discharge: 2018-10-20 | Disposition: A | Discharge status: Home / Self Care | Payer: Medicare Other | Source: Ambulatory Visit | Attending: Internal Medicine | Admitting: Internal Medicine

## 2018-10-20 DIAGNOSIS — Z01812 Encounter for preprocedural laboratory examination: Secondary | ICD-10-CM | POA: Insufficient documentation

## 2018-10-20 DIAGNOSIS — Z20828 Contact with and (suspected) exposure to other viral communicable diseases: Secondary | ICD-10-CM | POA: Insufficient documentation

## 2018-10-21 LAB — NOVEL CORONAVIRUS, NAA (HOSP ORDER, SEND-OUT TO REF LAB; TAT 18-24 HRS): SARS-CoV-2, NAA: NOT DETECTED

## 2018-10-23 ENCOUNTER — Other Ambulatory Visit: Payer: Self-pay

## 2018-10-23 ENCOUNTER — Ambulatory Visit (INDEPENDENT_AMBULATORY_CARE_PROVIDER_SITE_OTHER): Payer: Medicare Other | Admitting: Internal Medicine

## 2018-10-23 ENCOUNTER — Encounter: Payer: Self-pay | Admitting: Internal Medicine

## 2018-10-23 DIAGNOSIS — Z720 Tobacco use: Secondary | ICD-10-CM

## 2018-10-23 DIAGNOSIS — J8410 Pulmonary fibrosis, unspecified: Secondary | ICD-10-CM

## 2018-10-23 DIAGNOSIS — J449 Chronic obstructive pulmonary disease, unspecified: Secondary | ICD-10-CM

## 2018-10-23 DIAGNOSIS — Z23 Encounter for immunization: Secondary | ICD-10-CM

## 2018-10-23 DIAGNOSIS — J441 Chronic obstructive pulmonary disease with (acute) exacerbation: Secondary | ICD-10-CM

## 2018-10-23 LAB — PULMONARY FUNCTION TEST
DL/VA % pred: 84 %
DL/VA: 3.46 ml/min/mmHg/L
DLCO unc % pred: 69 %
DLCO unc: 13.94 ml/min/mmHg
FEF 25-75 Post: 0.75 L/sec
FEF 25-75 Pre: 0.39 L/sec
FEF2575-%Change-Post: 92 %
FEF2575-%Pred-Post: 39 %
FEF2575-%Pred-Pre: 20 %
FEV1-%Change-Post: 23 %
FEV1-%Pred-Post: 51 %
FEV1-%Pred-Pre: 41 %
FEV1-Post: 1.2 L
FEV1-Pre: 0.97 L
FEV1FVC-%Change-Post: 6 %
FEV1FVC-%Pred-Pre: 72 %
FEV6-%Change-Post: 15 %
FEV6-%Pred-Post: 67 %
FEV6-%Pred-Pre: 58 %
FEV6-Post: 1.99 L
FEV6-Pre: 1.72 L
FEV6FVC-%Change-Post: 0 %
FEV6FVC-%Pred-Post: 102 %
FEV6FVC-%Pred-Pre: 101 %
FVC-%Change-Post: 15 %
FVC-%Pred-Post: 66 %
FVC-%Pred-Pre: 57 %
FVC-Post: 2.04 L
FVC-Pre: 1.77 L
Post FEV1/FVC ratio: 59 %
Post FEV6/FVC ratio: 98 %
Pre FEV1/FVC ratio: 55 %
Pre FEV6/FVC Ratio: 97 %
RV % pred: 128 %
RV: 2.9 L
TLC % pred: 91 %
TLC: 4.74 L

## 2018-10-23 MED ORDER — BEVESPI AEROSPHERE 9-4.8 MCG/ACT IN AERO: 2.0000 | INHALATION_SPRAY | Freq: Two times a day (BID) | RESPIRATORY_TRACT | 0 refills | Status: DC

## 2018-10-23 NOTE — Progress Notes (Signed)
HPI F former heavy smoker , former scrub nurse followed for  COPD, lung nodule RLL/ Carcinoid/right lower lobectomy, 58mm RUL nodule , Hx PE/ IVC filter, complicated by hx discoid lupus rash, hx bilateral breast Ca lumpectomies, old calcified granulomas PFT 05/16/2012: Mild obstructive airways disease with insignificant response to bronchodilator, normal lung volumes, diffusion mildly reduced. FVC 3.08/96%, FEV1 2.01/86%, FEV1/FVC 0.65. TLC 95%, DLCO 73%. Quant TB Gold assay NEG 05/16/12, CT chest 08/13/12- The nodule also shows no evidence (IR did not do needle bx.) of contrast enhancement. Given all of these factors as well as the  presence of multiple abutting blood vessels which may make the  biopsy of higher risk, decision was made to not perform a percutaneous biopsy V/Q in 2016 intermediate for PE- has filter Office Spirometry 09/10/16-severe obstructive airways disease. FVC 1.91/54%, FEV1 1.31/42%, ratio 0.76, FEF 25-75% 0.50/23%. PFT 01/31/2017-severe obstruction with no response to bronchodilator, moderate diffusion defect.  FVC 1.85/54%, FEV1 1.19/46%, ratio 1.64, TLC 81%, DLCO 55% HST 01/31/17- Neg, AHI 2.5/ hr, desat to 89%, body weight 218 lbs PFT 10/23/2018- Severe obstruction with airtrapping, response to BD, Diffusion mildly reduced. 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  Describes a 2-week interval with fever which is now resolved.  She is not having cough, wheeze or sputum but feels more short of breath with  exertion.  Over the last month she has lost weight-she says 19 pounds-with persistent diarrhea.  I reviewed recent labs which are negative. Discussed use of Combivent as a rescue inhaler, or alternatively use her nebulizer. PFT 01/31/2017-severe obstruction with no response to bronchodilator, moderate diffusion defect.  FVC 1.85/54%, FEV1 1.19/46%, ratio 1.64, TLC 81%, DLCO 55% HST 01/31/17- Neg, AHI 2.5/ hr, desat to 89%, body weight 218 lbs CXR 11/11/2017- IMPRESSION: Scarring lateral right base with questionable small right pleural effusion. Lungs elsewhere clear. Stable cardiac silhouette. There is aortic atherosclerosis. Aortic Atherosclerosis (ICD10-I70.0).  10/23/2018-  3 yoF  smoker , former scrub nurse followed for  COPD, lung nodule RLL/ Carcinoid/right lower lobectomy, 36mm RUL nodule , Hx PE/ IVC filter, complicated by hx discoid lupus rash, hx bilateral breast Ca lumpectomies, old calcified granulomas Covid screen 9/14  NEG Follows for: COPD-  Stopped Trelegy due to sore throats. More SOB. Smoking 1 PPD- discussed. 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 Pending Nuc Cardiac study but says ECHO was "good".  Discussed CT chest from January. Had updated CT chest at [facility] recently- we can request report.  CT chest 02/28/2018- IMPRESSION: 1. Faint ground-glass densities in several secondary pulmonary lobules in the left upper lobe, favoring mild alveolitis. 2. No compelling findings of recurrent malignancy. Prior right lower lobectomy. 3. Aortic Atherosclerosis (ICD10-I70.0). Coronary atherosclerosis. Densely calcified mitral valve. 4. Old granulomatous disease. PFT 10/23/2018- Severe obstruction with airtrapping, response to BD, Diffusion mildly reduced. FVC 2.04/ 66%, FEV1 1.20/ 51%, R 59%, TLC 91%, DLCO 69%  ROS-see HPI    + = positive Constitutional:   No-   weight loss, night sweats, fevers, chills, + fatigue, lassitude. HEENT:   No-  headaches, difficulty swallowing, tooth/dental problems, sore throat,       No-  sneezing, itching, ear ache, nasal congestion, post nasal drip,  CV:  chest pain, orthopnea, PND, swelling in lower extremities, anasarca,  dizziness, palpitations Resp: +shortness of breath with exertion or at rest.              +cough,  No non-productive cough,  No- coughing up of blood.              No-   change in color of mucus.  + wheezing.   Skin:  rash or lesions. GI:  No-   heartburn, indigestion, abdominal pain, nausea, vomiting,  GU: . MS:  No-   joint pain or swelling. Neuro-     nothing unusual Psych:  No- change in mood or affect. No depression or anxiety.  No memory loss.  OBJ- Physical Exam General- Alert, Oriented, Affect-appropriate, Distress- none acute,   Skin-  Lymphadenopathy- none Head- atraumatic            Eyes- Gross vision intact, PERRLA, conjunctivae and secretions clear            Ears- Hearing, canals-normal            Nose- Clear, no-Septal dev, mucus, polyps, erosion, perforation             Throat- Mallampati II , mucosa clear , drainage- none, tonsils- atrophic. +Edentulous Neck- flexible , trachea midline, no stridor , thyroid nl, carotid no bruit Chest - symmetrical excursion , unlabored           Heart/CV- RRR , no murmur , no gallop  , no rub, nl s1 s2                           - JVD- none , edema- none, stasis changes- none, varices- none           Lung-clear/ diminished, cough none, dullness-none, rub- none,             Chest wall-  Abd-  Br/ Gen/ Rectal- Not done, not indicated Extrem- cyanosis- none, clubbing, none, atrophy- none, strength- nl.               + bilateral TKR scars,  Neuro- grossly intact to observation

## 2018-10-23 NOTE — Patient Instructions (Signed)
Sample Bevespi inhaler--  Inhale 2 puffs, twice daily maintenance- see if this helps you feel your breathing is better controlled. This is instead of Trelegy.  Ok to use the Combivent as a rescue inhaler- up to every 6 hours if needed  Ok to use the nebulizer machine up to every 4 hours if needed  Order- Flu vax senior  Please stop smoking- find something else to do with your hands and your energy  Please call as needed

## 2018-10-23 NOTE — Progress Notes (Signed)
PFT done today. 

## 2018-10-25 ENCOUNTER — Encounter: Payer: Self-pay | Admitting: Internal Medicine

## 2018-10-25 NOTE — Assessment & Plan Note (Signed)
Ongoing smoking. Discussed available support. Attempt to motivate her to try.

## 2018-10-25 NOTE — Assessment & Plan Note (Signed)
Try replacing Trelegy with Bevespi to see if this stops her sore throats.  Ok to use Combivent as a rescue inhaler as discussed.

## 2018-10-25 NOTE — Assessment & Plan Note (Signed)
Request report of recent CT chest from [place].

## 2018-10-29 ENCOUNTER — Telehealth: Payer: Self-pay | Admitting: Internal Medicine

## 2018-10-29 NOTE — Telephone Encounter (Signed)
Spoke with the pt  She wanted CDY to know that she went grocery shopping 10/24/18 and had some SOB, came home and had to call EMS due to SOB after putting away all of her groceries  She was given neb tx and this helped  She states the EMT told her to let us know that she was doing a lot of "restrictive breathing" She reports that the bevespi that she is taking now is helping more than the trelegy  She is using the combivent about 1-2 x per day  She has not used her neb at home bc she was not aware she could use this with combivent  I advised her that she could use it as a back up every 4 hours as needed  She verbalized understanding  States that she is really working on cutting back her smoking and plans to stop as this recent scare really opened her eyes Will forward to Dr Annamaria Boots as Juluis Rainier

## 2018-11-13 ENCOUNTER — Other Ambulatory Visit: Payer: Self-pay | Admitting: Family Medicine

## 2018-12-07 ENCOUNTER — Other Ambulatory Visit: Payer: Self-pay | Admitting: Family Medicine

## 2018-12-10 ENCOUNTER — Other Ambulatory Visit: Payer: Self-pay | Admitting: Family Medicine

## 2018-12-10 ENCOUNTER — Telehealth: Payer: Self-pay | Admitting: Internal Medicine

## 2018-12-10 MED ORDER — BEVESPI AEROSPHERE 9-4.8 MCG/ACT IN AERO: 2.0000 | INHALATION_SPRAY | Freq: Two times a day (BID) | RESPIRATORY_TRACT | 3 refills | Status: AC

## 2018-12-10 MED ORDER — COMBIVENT RESPIMAT 20-100 MCG/ACT IN AERS: INHALATION_SPRAY | RESPIRATORY_TRACT | 3 refills | Status: DC

## 2018-12-10 NOTE — Telephone Encounter (Signed)
I spoke with the patient and spoke with her in regards to the [place], since it was given as a sample at there last visit and she states that she has seen improvement and this is why she is requesting a script. I advised her that I will send this to her requested pharmacy and send in a refill of her Combivent as well. Nothing further is needed at this time.

## 2018-12-28 ENCOUNTER — Other Ambulatory Visit: Payer: Self-pay | Admitting: Family Medicine

## 2019-01-02 ENCOUNTER — Other Ambulatory Visit: Payer: Self-pay | Admitting: Family Medicine

## 2019-01-08 ENCOUNTER — Other Ambulatory Visit: Payer: Self-pay | Admitting: Family Medicine

## 2019-04-05 ENCOUNTER — Ambulatory Visit: Payer: Medicare Other

## 2019-04-14 IMAGING — CR DG CHEST 2V
2 series · 2 of 2 positions shown · non-contrast
Comparison: 12/24/2016.

CLINICAL DATA: Severe shortness of breath.  Wheezing.

EXAM:
CHEST  2 VIEW

[w chest lat]
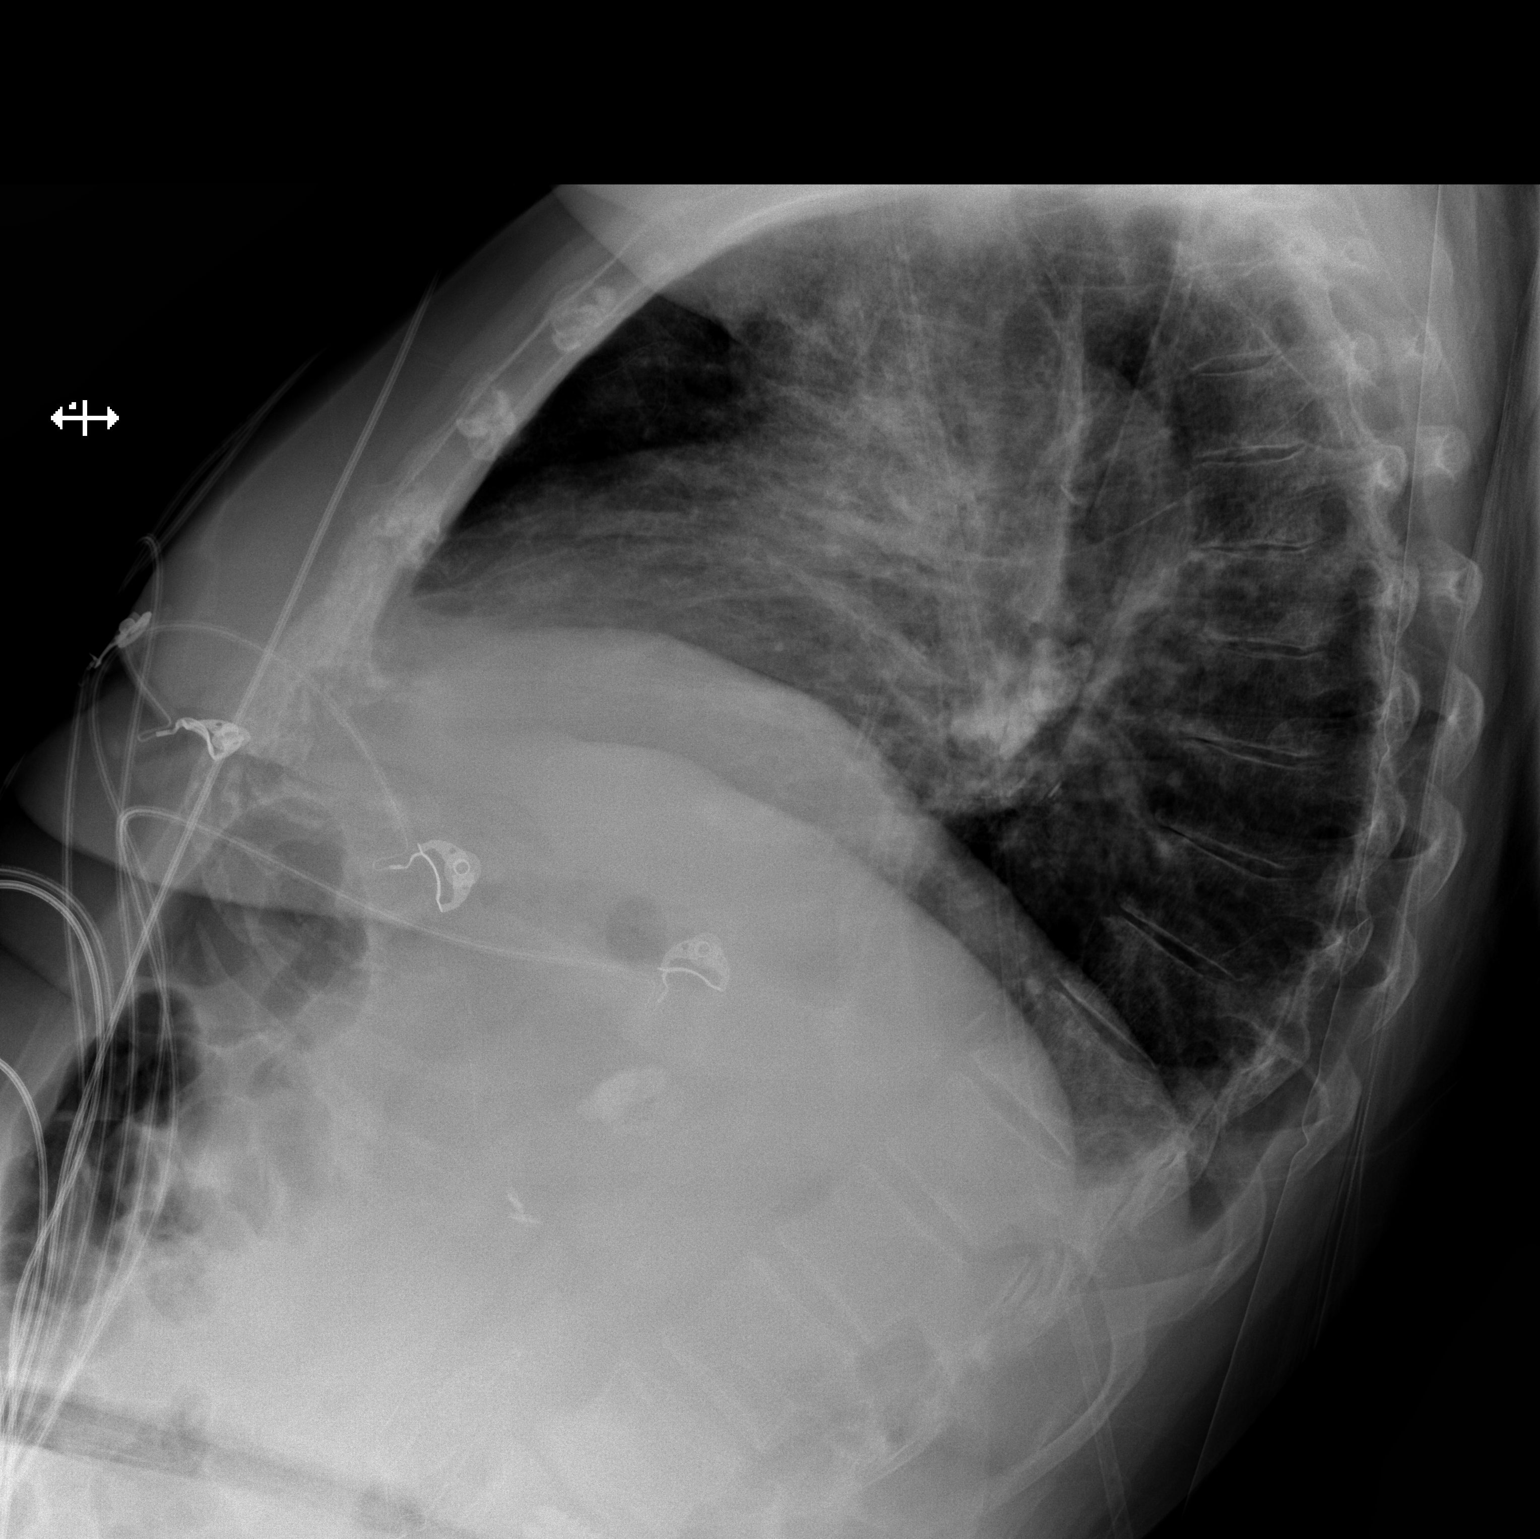

[x chest ap]
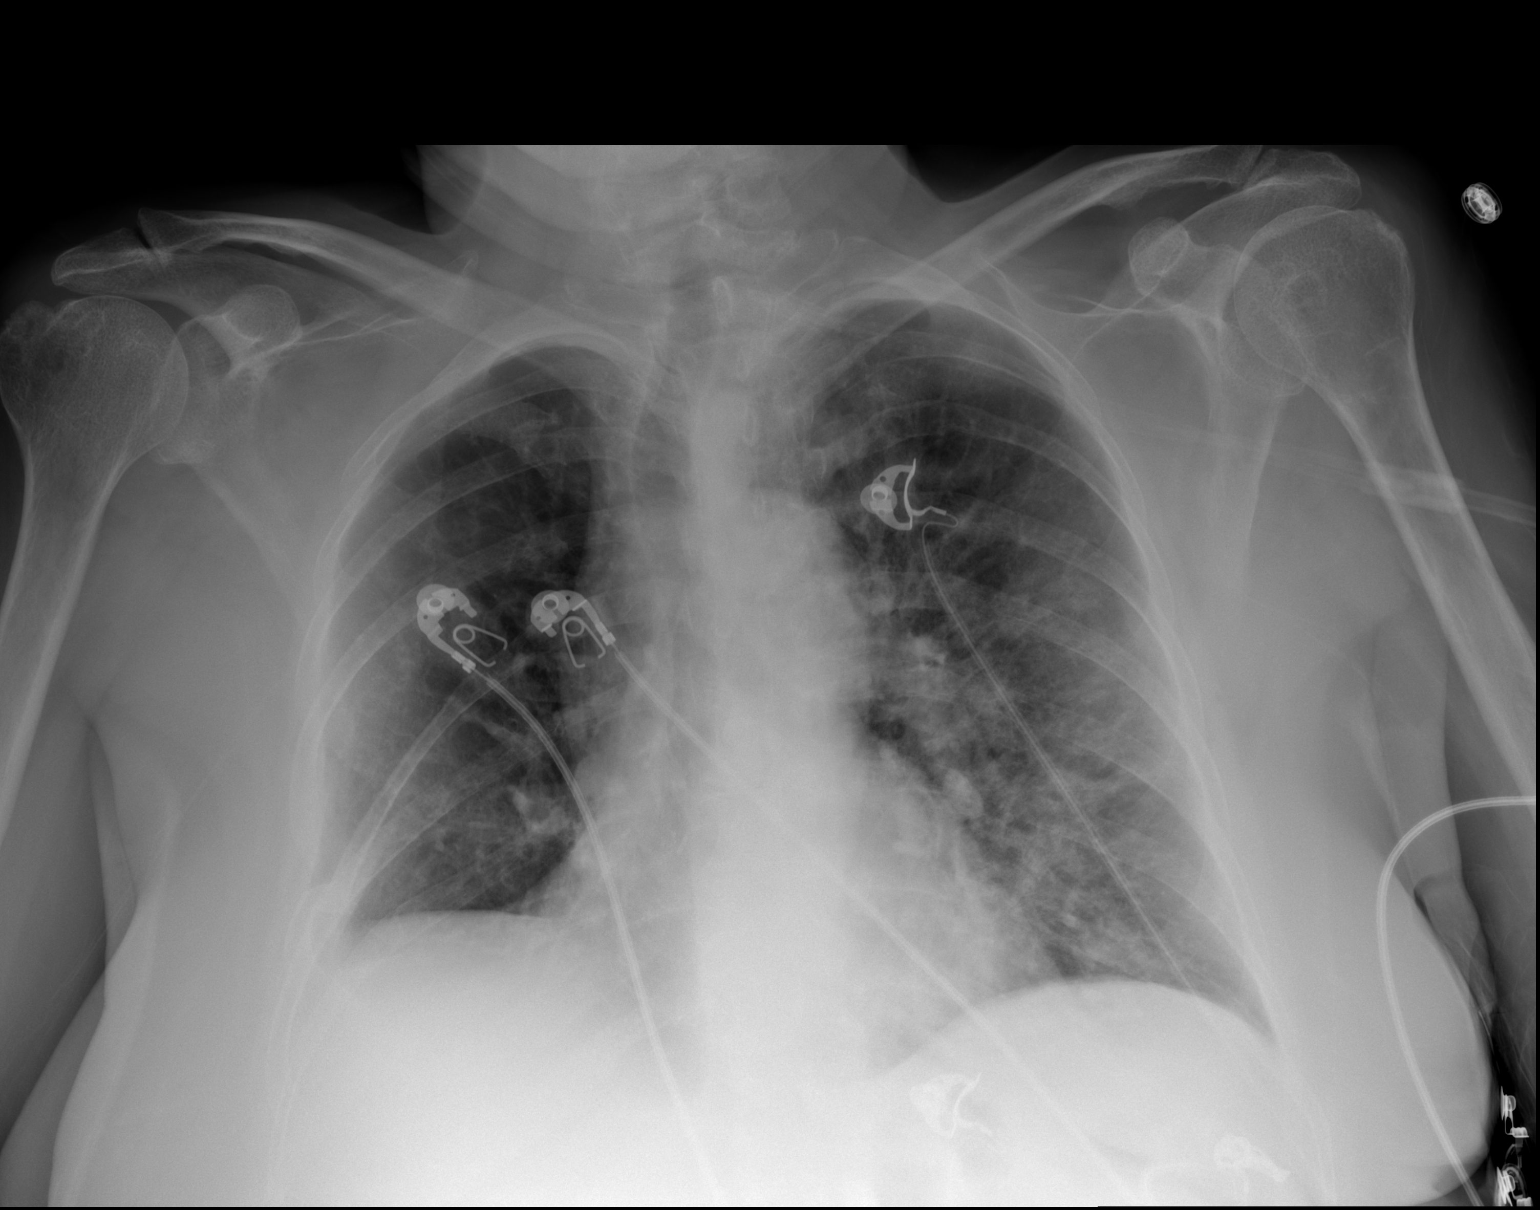

[2 of 2 positions shown; findings below may reference images not displayed]

FINDINGS: Mediastinum hilar structures are normal. Left mid lung and left lung
base infiltrate. No pleural effusion or pneumothorax. Thoracic spine
scoliosis and degenerative change. Surgical clips right upper
quadrant.
IMPRESSION: Left mid lung and left lung base infiltrate consistent with
pneumonia.

## 2019-04-22 ENCOUNTER — Ambulatory Visit: Payer: Medicare Other | Admitting: Internal Medicine

## 2019-07-27 ENCOUNTER — Encounter: Payer: Self-pay | Admitting: Neurology

## 2019-09-21 ENCOUNTER — Other Ambulatory Visit: Payer: Self-pay | Admitting: Internal Medicine

## 2019-10-16 NOTE — Progress Notes (Deleted)
NEUROLOGY CONSULTATION NOTE  Breanna Dixon MRN: [id number] DOB: 1945-06-07  Referring provider: Simona Huh, NP Primary care provider: Simona Huh, NP  Reason for consult:  Bilateral leg weakness  HISTORY OF PRESENT ILLNESS: Breanna Dixon is a 74 year old ***-handed female who presents for bilateral leg weakness.  History supplemented by referring provider's note.  Patient has chronic pain involving her head, face, neck, back, hips and knees.  She has known spondylosis and degenerative disc and facet disease from the cervical through lumbar spine.  She has undergone multiple back surgeries as well as left hip replacement and bilateral knee replacement.  She is treated with chronic opioid management.  She also has nocturnal leg cramps at rest, involving the feet, calves and thighs bilaterally.  She endorsed that her right leg has been getting weaker and will start to give out when walking.  She has had falls ***.  She followed up with orthopedics who believed her problem was coming from her spine and recommended seeing her spine doctor.  ***  She underwent extensive radiographic imaging on 12/17/2018.  X-ray of left hip showed intact prosthesis with no hardware failure.  X-ray of right hip showed no arthropathic abnormality, fracture or other acute abnormality.  X-ray of lumbar spine showed extensive posoperative changes with degenerative disease, including "postlaminectomy defects involving L3, L4 and L5. Posterior stabilization rods at L3-4 with screws present and spacing device and fusion of the L3 and L4 vertebral bodies.  Lateral bony fusion.  Thee also appears to be fusion of the L4 and L5 vertebral bodies with marked disc space narrowing at L5-S1 and L2-3.  Endplate sclerosis and osteophytes formation at L2-3.  No lytic or blast lesions.  No compression fractures.  SI joints are normal."  X-ray of cervical spine showed marked multilevel spondylosis, degenerative disc disease and facet  osteoarthritis.  X-ray of thoracic spine showed stable scoliosis with multilevel degenerative disc disease and spondylosis.  X-ray of left knee showed total knee prosthesis with questionable loose body.  PAST MEDICAL HISTORY: Past Medical History:  Diagnosis Date  . Anxiety    takes Ativan daily, Panic attack   . Arthritis   . Bleeding ulcer 1974  . Breast cancer ([place])    right breast 2000  . Cancer [facility])    Surgery only, Lung Cancer- surgery only  . COPD (chronic obstructive pulmonary disease) ([place])   . Depression   . Fibromyalgia    takes Lyrica daily  . Head injury with loss of consciousness ([place])   . Headache(784.0)    takes Tegretol and Verapamil nightly  . History of blood transfusion    no abnormal reaction noted  . History of bronchitis   . History of colon polyps    benign  . History of kidney stones    several  . History of ulcer disease    pyloric   . Hyperlipidemia    takes Fenofibrate daily  . Hypertension    takes Toprol daily  . Hypertension   . Joint pain   . Lung mass April 2013   Right lower lobe lung mass  . Lupus ([place])    takes Cymbalta,Plaquenil  daily  . Pneumonia    hx of  . PTSD (post-traumatic stress disorder)    takes Cymbalta daily  . Pulmonary embolism ([place]) 2006   IVC filter  . Seasonal allergies    flonase prn  . Vertigo    takes Antivert prn    PAST SURGICAL  HISTORY: Past Surgical History:  Procedure Laterality Date  . ABDOMINAL HYSTERECTOMY  1992  . APPENDECTOMY  1965  . BACK SURGERY  2000/2010   fusion  . BREAST EXCISIONAL BIOPSY Left   . BREAST LUMPECTOMY Right   . BREAST SURGERY     bilateral partial masectomy  . CARDIAC CATHETERIZATION  1992   no PCI  . CHOLECYSTECTOMY  1994  . COLONOSCOPY    . COLONOSCOPY    . CYSTOSCOPY     multiple  . DILATION AND CURETTAGE OF UTERUS    . HERNIA REPAIR    . INCISIONAL HERNIA REPAIR    . IVC filter    . JOINT REPLACEMENT Bilateral    Knees  . KNEE ARTHROSCOPY Bilateral     . MULTIPLE TOOTH EXTRACTIONS  1990  . POLYPECTOMY    . THORACOTOMY/LOBECTOMY Right 07/15/2014   Procedure: RIGHT THORACOTOMY/RIGHT LOWER LOBE  LOBECTOMY;  Surgeon: Gaye Pollack, MD;  Location: [facility] OR;  Service: Thoracic;  Laterality: Right;  . TONSILLECTOMY  1967  . TOTAL HIP ARTHROPLASTY Left 09/26/2016   Procedure: TOTAL HIP ARTHROPLASTY ANTERIOR APPROACH;  Surgeon: Frederik Pear, MD;  Location: [place];  Service: Orthopedics;  Laterality: Left;  . TOTAL KNEE ARTHROPLASTY  06/04/2011   Procedure: TOTAL KNEE ARTHROPLASTY;  Surgeon: Rudean Haskell, MD;  Location: [place];  Service: Orthopedics;  Laterality: Right;  . TOTAL KNEE ARTHROPLASTY  10/15/2011   Procedure: TOTAL KNEE ARTHROPLASTY;  Surgeon: Rudean Haskell, MD;  Location: [place];  Service: Orthopedics;  Laterality: Left;  . [place]  . VENA CAVA FILTER PLACEMENT  2006  . VIDEO BRONCHOSCOPY WITH ENDOBRONCHIAL NAVIGATION N/A 06/02/2014   Procedure: VIDEO BRONCHOSCOPY WITH ENDOBRONCHIAL NAVIGATION;  Surgeon: Collene Gobble, MD;  Location: [facility] OR;  Service: Thoracic;  Laterality: N/A;    MEDICATIONS: Current Outpatient Medications on File Prior to Visit  Medication Sig Dispense Refill  . albuterol (PROVENTIL) (2.5 MG/3ML) 0.083% nebulizer solution Take 3 mLs (2.5 mg total) by nebulization every 6 (six) hours as needed for wheezing or shortness of breath. 15 mL 12  . Brexpiprazole (REXULTI) 0.5 MG TABS Take 1 tablet by mouth daily.    . Brexpiprazole (REXULTI) 2 MG TABS Take 2.5 mg by mouth daily.     . carbamazepine (TEGRETOL XR) 200 MG 12 hr tablet TAKE 1 TABLET BY MOUTH ONCE DAILY IN THE MORNING AND 2 ONCE DAILY IN THE EVENING 270 tablet 3  . COMBIVENT RESPIMAT 20-100 MCG/ACT AERS respimat INHALE 2 PUFFS BY MOUTH EVERY 6 HOURS AS NEEDED FOR SHORTNESS OF BREATH 4 g 0  . fenofibrate (TRICOR) 145 MG tablet Take 1 tablet by mouth once daily 60 tablet 0  . furosemide (LASIX) 20 MG tablet Take 1 tablet by mouth once daily 90 tablet 0   . Glycopyrrolate-Formoterol (BEVESPI AEROSPHERE) 9-4.8 MCG/ACT AERO Inhale 2 puffs into the lungs 2 (two) times daily. 10.7 g 3  . HYDROcodone-acetaminophen (NORCO) 10-325 MG tablet Take 1 tablet by mouth 3 (three) times daily. And prn    . hydroxychloroquine (PLAQUENIL) 200 MG tablet Take 1 tablet by mouth twice daily 90 tablet 0  . hydrOXYzine (VISTARIL) 25 MG capsule Take 50 mg by mouth 3 (three) times daily.     . metoprolol succinate (TOPROL-XL) 100 MG 24 hr tablet Take 1 tablet (100 mg total) by mouth daily. Take with or immediately following a meal. 90 tablet 1  . MYRBETRIQ 50 MG TB24 tablet Take 50 mg by mouth  daily.   11  . Nebulizers (COMPRESSOR/NEBULIZER) MISC Dx COPD, use as needed 1 each 0  . predniSONE (DELTASONE) 50 MG tablet Please take 1 50mg  tablet daily for 5 days 5 tablet 0  . pregabalin (LYRICA) 100 MG capsule TAKE 1 CAPSULE BY MOUTH TWO TIMES DAILY. TAKE 1 CAPSULE IN THE MORNING AND TAKE 2 CAPSULES IN THE EVENING 270 capsule 3   No current facility-administered medications on file prior to visit.    ALLERGIES: Allergies  Allergen Reactions  . Imitrex [Sumatriptan Base] Nausea And Vomiting    Rapid heart beat.  [name] Ketorolac Tromethamine Nausea And Vomiting    Rapid heart beat.  . Stadol [Butorphanol Tartrate] Nausea And Vomiting  . Iodinated Diagnostic Agents Nausea Only and Palpitations    Can tolerate iodine The old IV dye - she is not allergic to the new IV dye  . Shellfish Allergy Nausea Only, Swelling and Palpitations    FAMILY HISTORY: Family History  Adopted: Yes  Problem Relation Age of Onset  . Anesthesia problems Neg Hx   . Hypotension Neg Hx   . Malignant hyperthermia Neg Hx   . Pseudochol deficiency Neg Hx    ***.  SOCIAL HISTORY: Social History   Socioeconomic History  . Marital status: Divorced    Spouse name: Not on file  . Number of children: Not on file  . Years of education: Not on file  . Highest education level: Not on file   Occupational History  . Occupation: disabled-former nurse  Tobacco Use  . Smoking status: Current Every Day Smoker    Packs/day: 0.50    Types: Cigarettes  . Smokeless tobacco: Never Used  . Tobacco comment: pt started back smoking 08/08/16  Vaping Use  . Vaping Use: Never used  Substance and Sexual Activity  . Alcohol use: No    Alcohol/week: 0.0 standard drinks  . Drug use: No  . Sexual activity: Never    Birth control/protection: Surgical  Other Topics Concern  . Not on file  Social History Narrative  . Not on file   Social Determinants of Health   Financial Resource Strain:   . Difficulty of Paying Living Expenses: Not on file  Food Insecurity:   . Worried About Charity fundraiser in the Last Year: Not on file  . Ran Out of Food in the Last Year: Not on file  Transportation Needs:   . Lack of Transportation (Medical): Not on file  . Lack of Transportation (Non-Medical): Not on file  Physical Activity:   . Days of Exercise per Week: Not on file  . Minutes of Exercise per Session: Not on file  Stress:   . Feeling of Stress : Not on file  Social Connections:   . Frequency of Communication with Friends and Family: Not on file  . Frequency of Social Gatherings with Friends and Family: Not on file  . Attends Religious Services: Not on file  . Active Member of Clubs or Organizations: Not on file  . Attends Archivist Meetings: Not on file  . Marital Status: Not on file  Intimate Partner Violence:   . Fear of Current or Ex-Partner: Not on file  . Emotionally Abused: Not on file  . Physically Abused: Not on file  . Sexually Abused: Not on file    REVIEW OF SYSTEMS: Constitutional: No fevers, chills, or sweats, no generalized fatigue, change in appetite Eyes: No visual changes, double vision, eye pain Ear, nose and throat: No  hearing loss, ear pain, nasal congestion, sore throat Cardiovascular: No chest pain, palpitations Respiratory:  No shortness of  breath at rest or with exertion, wheezes GastrointestinaI: No nausea, vomiting, diarrhea, abdominal pain, fecal incontinence Genitourinary:  No dysuria, urinary retention or frequency Musculoskeletal:  No neck pain, back pain Integumentary: No rash, pruritus, skin lesions Neurological: as above Psychiatric: No depression, insomnia, anxiety Endocrine: No palpitations, fatigue, diaphoresis, mood swings, change in appetite, change in weight, increased thirst Hematologic/Lymphatic:  No purpura, petechiae. Allergic/Immunologic: no itchy/runny eyes, nasal congestion, recent allergic reactions, rashes  PHYSICAL EXAM: *** General: No acute distress.  Patient appears ***-groomed.  *** Head:  Normocephalic/atraumatic Eyes:  fundi examined but not visualized Neck: supple, no paraspinal tenderness, full range of motion Back: No paraspinal tenderness Heart: regular rate and rhythm Lungs: Clear to auscultation bilaterally. Vascular: No carotid bruits. Neurological Exam: Mental status: alert and oriented to person, place, and time, recent and remote memory intact, fund of knowledge intact, attention and concentration intact, speech fluent and not dysarthric, language intact. Cranial nerves: CN I: not tested CN II: pupils equal, round and reactive to light, visual fields intact CN III, IV, VI:  full range of motion, no nystagmus, no ptosis CN V: facial sensation intact CN VII: upper and lower face symmetric CN VIII: hearing intact CN IX, X: gag intact, uvula midline CN XI: sternocleidomastoid and trapezius muscles intact CN XII: tongue midline Bulk & Tone: normal, no fasciculations. Motor:  5/5 throughout *** Sensation:  [place] *** temperature *** and vibration sensation intact.  ***. Deep Tendon Reflexes:  2+ throughout, *** toes downgoing.  *** Finger to nose testing:  Without dysmetria.  *** Heel to shin:  Without dysmetria.  *** Gait:  Normal station and stride.  Able to turn and tandem  walk. [place] ***.  IMPRESSION: ***  PLAN: ***  Thank you for allowing me to take part in the care of this patient.  Metta Clines, DO  CC: ***

## 2019-10-19 ENCOUNTER — Ambulatory Visit: Payer: Medicare Other | Admitting: Neurology

## 2019-11-12 ENCOUNTER — Other Ambulatory Visit: Payer: Self-pay | Admitting: Internal Medicine

## 2019-11-27 ENCOUNTER — Encounter: Payer: Self-pay | Admitting: Neurology

## 2019-12-03 NOTE — Progress Notes (Deleted)
NEUROLOGY CONSULTATION NOTE  Breanna Dixon MRN: [id number] DOB: October 16, 1945  Referring provider: Simona Huh, NP Primary care provider: Simona Huh, NP  Reason for consult:  Bilateral leg weakness  HISTORY OF PRESENT ILLNESS: Breanna Dixon is a 74 year old ***-handed female with COPD, fibromyalgia, s/p bilateral knee replacement, lupus, depression/anxiety/PTSD and history of lung cancer s/p surgery who presents for bilateral leg weakness.  History supplemented by orthopedics and referring provider notes.  ***.  She has fibromyalgia and is on chronic opioid management for treatment of neck and back pain.  She has history of multiple lumbar surgeries.  Lumbar myelogram from 05/15/2013 showed multilevel degenerative disc disease and facet arthropathy s/p PLIF and posterior decompression L3-4 through L5-S1.  She has nocturnal leg cramps.  She has history of left hip replacement.  She also is status post bilateral total knee replacement.  She followed up with her orthopedist in July for assessment of left leg pain and weakness.  Musculoskeletal exam of the left knee showed slightly decreased range of motion but otherwise intact.  X-rays showed no acute changes of the hardware.    PAST MEDICAL HISTORY: Past Medical History:  Diagnosis Date  . Anxiety    takes Ativan daily, Panic attack   . Arthritis   . Bleeding ulcer 1974  . Breast cancer ([place])    right breast 2000  . Cancer [facility])    Surgery only, Lung Cancer- surgery only  . COPD (chronic obstructive pulmonary disease) ([place])   . Depression   . Fibromyalgia    takes Lyrica daily  . Head injury with loss of consciousness ([place])   . Headache(784.0)    takes Tegretol and Verapamil nightly  . History of blood transfusion    no abnormal reaction noted  . History of bronchitis   . History of colon polyps    benign  . History of kidney stones    several  . History of ulcer disease    pyloric   . Hyperlipidemia    takes  Fenofibrate daily  . Hypertension    takes Toprol daily  . Hypertension   . Joint pain   . Lung mass April 2013   Right lower lobe lung mass  . Lupus ([place])    takes Cymbalta,Plaquenil  daily  . Pneumonia    hx of  . PTSD (post-traumatic stress disorder)    takes Cymbalta daily  . Pulmonary embolism ([place]) 2006   IVC filter  . Seasonal allergies    flonase prn  . Vertigo    takes Antivert prn    PAST SURGICAL HISTORY: Past Surgical History:  Procedure Laterality Date  . ABDOMINAL HYSTERECTOMY  1992  . APPENDECTOMY  1965  . BACK SURGERY  2000/2010   fusion  . BREAST EXCISIONAL BIOPSY Left   . BREAST LUMPECTOMY Right   . BREAST SURGERY     bilateral partial masectomy  . CARDIAC CATHETERIZATION  1992   no PCI  . CHOLECYSTECTOMY  1994  . COLONOSCOPY    . COLONOSCOPY    . CYSTOSCOPY     multiple  . DILATION AND CURETTAGE OF UTERUS    . HERNIA REPAIR    . INCISIONAL HERNIA REPAIR    . IVC filter    . JOINT REPLACEMENT Bilateral    Knees  . KNEE ARTHROSCOPY Bilateral   . MULTIPLE TOOTH EXTRACTIONS  1990  . POLYPECTOMY    . THORACOTOMY/LOBECTOMY Right 07/15/2014   Procedure: RIGHT THORACOTOMY/RIGHT LOWER LOBE  LOBECTOMY;  Surgeon: Gaye Pollack, MD;  Location: [place];  Service: Thoracic;  Laterality: Right;  . TONSILLECTOMY  1967  . TOTAL HIP ARTHROPLASTY Left 09/26/2016   Procedure: TOTAL HIP ARTHROPLASTY ANTERIOR APPROACH;  Surgeon: Frederik Pear, MD;  Location: [place];  Service: Orthopedics;  Laterality: Left;  . TOTAL KNEE ARTHROPLASTY  06/04/2011   Procedure: TOTAL KNEE ARTHROPLASTY;  Surgeon: Rudean Haskell, MD;  Location: [place];  Service: Orthopedics;  Laterality: Right;  . TOTAL KNEE ARTHROPLASTY  10/15/2011   Procedure: TOTAL KNEE ARTHROPLASTY;  Surgeon: Rudean Haskell, MD;  Location: [place];  Service: Orthopedics;  Laterality: Left;  . [place]  . VENA CAVA FILTER PLACEMENT  2006  . VIDEO BRONCHOSCOPY WITH ENDOBRONCHIAL NAVIGATION N/A 06/02/2014    Procedure: VIDEO BRONCHOSCOPY WITH ENDOBRONCHIAL NAVIGATION;  Surgeon: Collene Gobble, MD;  [facility];  Service: Thoracic;  Laterality: N/A;    MEDICATIONS: Current Outpatient Medications on File Prior to Visit  Medication Sig Dispense Refill  . albuterol (PROVENTIL) (2.5 MG/3ML) 0.083% nebulizer solution Take 3 mLs (2.5 mg total) by nebulization every 6 (six) hours as needed for wheezing or shortness of breath. 15 mL 12  . Brexpiprazole (REXULTI) 0.5 MG TABS Take 1 tablet by mouth daily.    . Brexpiprazole (REXULTI) 2 MG TABS Take 2.5 mg by mouth daily.     . carbamazepine (TEGRETOL XR) 200 MG 12 hr tablet TAKE 1 TABLET BY MOUTH ONCE DAILY IN THE MORNING AND 2 ONCE DAILY IN THE EVENING 270 tablet 3  . COMBIVENT RESPIMAT 20-100 MCG/ACT AERS respimat INHALE 2 PUFFS BY MOUTH EVERY 6 HOURS AS NEEDED FOR SHORTNESS OF BREATH 4 g 0  . fenofibrate (TRICOR) 145 MG tablet Take 1 tablet by mouth once daily 60 tablet 0  . furosemide (LASIX) 20 MG tablet Take 1 tablet by mouth once daily 90 tablet 0  . Glycopyrrolate-Formoterol (BEVESPI AEROSPHERE) 9-4.8 MCG/ACT AERO Inhale 2 puffs into the lungs 2 (two) times daily. 10.7 g 3  . HYDROcodone-acetaminophen (NORCO) 10-325 MG tablet Take 1 tablet by mouth 3 (three) times daily. And prn    . hydroxychloroquine (PLAQUENIL) 200 MG tablet Take 1 tablet by mouth twice daily 90 tablet 0  . hydrOXYzine (VISTARIL) 25 MG capsule Take 50 mg by mouth 3 (three) times daily.     . metoprolol succinate (TOPROL-XL) 100 MG 24 hr tablet Take 1 tablet (100 mg total) by mouth daily. Take with or immediately following a meal. 90 tablet 1  . MYRBETRIQ 50 MG TB24 tablet Take 50 mg by mouth daily.   11  . Nebulizers (COMPRESSOR/NEBULIZER) MISC Dx COPD, use as needed 1 each 0  . predniSONE (DELTASONE) 50 MG tablet Please take 1 50mg  tablet daily for 5 days 5 tablet 0  . pregabalin (LYRICA) 100 MG capsule TAKE 1 CAPSULE BY MOUTH TWO TIMES DAILY. TAKE 1 CAPSULE IN THE MORNING AND  TAKE 2 CAPSULES IN THE EVENING 270 capsule 3   No current facility-administered medications on file prior to visit.    ALLERGIES: Allergies  Allergen Reactions  . Imitrex [Sumatriptan Base] Nausea And Vomiting    Rapid heart beat.  [name] Ketorolac Tromethamine Nausea And Vomiting    Rapid heart beat.  . Stadol [Butorphanol Tartrate] Nausea And Vomiting  . Iodinated Diagnostic Agents Nausea Only and Palpitations    Can tolerate iodine The old IV dye - she is not allergic to the new IV dye  . Shellfish Allergy  Nausea Only, Swelling and Palpitations    FAMILY HISTORY: Family History  Adopted: Yes  Problem Relation Age of Onset  . Anesthesia problems Neg Hx   . Hypotension Neg Hx   . Malignant hyperthermia Neg Hx   . Pseudochol deficiency Neg Hx    ***.  SOCIAL HISTORY: Social History   Socioeconomic History  . Marital status: Divorced    Spouse name: Not on file  . Number of children: Not on file  . Years of education: Not on file  . Highest education level: Not on file  Occupational History  . Occupation: disabled-former nurse  Tobacco Use  . Smoking status: Current Every Day Smoker    Packs/day: 0.50    Types: Cigarettes  . Smokeless tobacco: Never Used  . Tobacco comment: pt started back smoking 08/08/16  Vaping Use  . Vaping Use: Never used  Substance and Sexual Activity  . Alcohol use: No    Alcohol/week: 0.0 standard drinks  . Drug use: No  . Sexual activity: Never    Birth control/protection: Surgical  Other Topics Concern  . Not on file  Social History Narrative  . Not on file   Social Determinants of Health   Financial Resource Strain:   . Difficulty of Paying Living Expenses: Not on file  Food Insecurity:   . Worried About Charity fundraiser in the Last Year: Not on file  . Ran Out of Food in the Last Year: Not on file  Transportation Needs:   . Lack of Transportation (Medical): Not on file  . Lack of Transportation (Non-Medical): Not on file    Physical Activity:   . Days of Exercise per Week: Not on file  . Minutes of Exercise per Session: Not on file  Stress:   . Feeling of Stress : Not on file  Social Connections:   . Frequency of Communication with Friends and Family: Not on file  . Frequency of Social Gatherings with Friends and Family: Not on file  . Attends Religious Services: Not on file  . Active Member of Clubs or Organizations: Not on file  . Attends Archivist Meetings: Not on file  . Marital Status: Not on file  Intimate Partner Violence:   . Fear of Current or Ex-Partner: Not on file  . Emotionally Abused: Not on file  . Physically Abused: Not on file  . Sexually Abused: Not on file    REVIEW OF SYSTEMS: Constitutional: No fevers, chills, or sweats, no generalized fatigue, change in appetite Eyes: No visual changes, double vision, eye pain Ear, nose and throat: No hearing loss, ear pain, nasal congestion, sore throat Cardiovascular: No chest pain, palpitations Respiratory:  No shortness of breath at rest or with exertion, wheezes GastrointestinaI: No nausea, vomiting, diarrhea, abdominal pain, fecal incontinence Genitourinary:  No dysuria, urinary retention or frequency Musculoskeletal:  No neck pain, back pain Integumentary: No rash, pruritus, skin lesions Neurological: as above Psychiatric: No depression, insomnia, anxiety Endocrine: No palpitations, fatigue, diaphoresis, mood swings, change in appetite, change in weight, increased thirst Hematologic/Lymphatic:  No purpura, petechiae. Allergic/Immunologic: no itchy/runny eyes, nasal congestion, recent allergic reactions, rashes  PHYSICAL EXAM: *** General: No acute distress.  Patient appears ***-groomed.  *** Head:  Normocephalic/atraumatic Eyes:  fundi examined but not visualized Neck: supple, no paraspinal tenderness, full range of motion Back: No paraspinal tenderness Heart: regular rate and rhythm Lungs: Clear to auscultation  bilaterally. Vascular: No carotid bruits. Neurological Exam: Mental status: alert and oriented to  person, place, and time, recent and remote memory intact, fund of knowledge intact, attention and concentration intact, speech fluent and not dysarthric, language intact. Cranial nerves: CN I: not tested CN II: pupils equal, round and reactive to light, visual fields intact CN III, IV, VI:  full range of motion, no nystagmus, no ptosis CN V: facial sensation intact CN VII: upper and lower face symmetric CN VIII: hearing intact CN IX, X: gag intact, uvula midline CN XI: sternocleidomastoid and trapezius muscles intact CN XII: tongue midline Bulk & Tone: normal, no fasciculations. Motor:  5/5 throughout *** Sensation:  Pinprick *** temperature *** and vibration sensation intact.  ***. Deep Tendon Reflexes:  2+ throughout, *** toes downgoing.  *** Finger to nose testing:  Without dysmetria.  *** Heel to shin:  Without dysmetria.  *** Gait:  Normal station and stride.  Able to turn and tandem walk. Romberg ***.  IMPRESSION: ***  PLAN: ***  Thank you for allowing me to take part in the care of this patient.  Metta Clines, DO  CC: ***

## 2019-12-04 ENCOUNTER — Ambulatory Visit: Payer: Medicare Other | Admitting: Neurology

## 2020-01-08 ENCOUNTER — Ambulatory Visit: Payer: Medicare Other | Admitting: Neurology

## 2020-02-16 ENCOUNTER — Ambulatory Visit: Payer: Medicare Other | Admitting: Neurology

## 2020-04-11 ENCOUNTER — Ambulatory Visit: Payer: Medicare Other | Admitting: Neurology

## 2020-06-14 ENCOUNTER — Ambulatory Visit: Payer: Medicare Other | Admitting: Neurology

## 2020-06-14 NOTE — Progress Notes (Deleted)
NEUROLOGY CONSULTATION NOTE  Breanna Dixon MRN: [id number] DOB: ***  Referring provider: *** Primary care provider: ***  Reason for consult:  ***  Assessment/Plan:   ***   Subjective:  ***     PAST MEDICAL HISTORY: Past Medical History:  Diagnosis Date  . Anxiety    takes Ativan daily, Panic attack   . Arthritis   . Bleeding ulcer 1974  . Breast cancer ([place])    right breast 2000  . Cancer [facility])    Surgery only, Lung Cancer- surgery only  . COPD (chronic obstructive pulmonary disease) ([place])   . Depression   . Fibromyalgia    takes Lyrica daily  . Head injury with loss of consciousness ([place])   . Headache(784.0)    takes Tegretol and Verapamil nightly  . History of blood transfusion    no abnormal reaction noted  . History of bronchitis   . History of colon polyps    benign  . History of kidney stones    several  . History of ulcer disease    pyloric   . Hyperlipidemia    takes Fenofibrate daily  . Hypertension    takes Toprol daily  . Hypertension   . Joint pain   . Lung mass April 2013   Right lower lobe lung mass  . Lupus ([place])    takes Cymbalta,Plaquenil  daily  . Pneumonia    hx of  . PTSD (post-traumatic stress disorder)    takes Cymbalta daily  . Pulmonary embolism ([place]) 2006   IVC filter  . Seasonal allergies    flonase prn  . Vertigo    takes Antivert prn    PAST SURGICAL HISTORY: Past Surgical History:  Procedure Laterality Date  . ABDOMINAL HYSTERECTOMY  1992  . APPENDECTOMY  1965  . BACK SURGERY  2000/2010   fusion  . BREAST EXCISIONAL BIOPSY Left   . BREAST LUMPECTOMY Right   . BREAST SURGERY     bilateral partial masectomy  . CARDIAC CATHETERIZATION  1992   no PCI  . CHOLECYSTECTOMY  1994  . COLONOSCOPY    . COLONOSCOPY    . CYSTOSCOPY     multiple  . DILATION AND CURETTAGE OF UTERUS    . HERNIA REPAIR    . INCISIONAL HERNIA REPAIR    . IVC filter    . JOINT REPLACEMENT Bilateral    Knees  . KNEE ARTHROSCOPY  Bilateral   . MULTIPLE TOOTH EXTRACTIONS  1990  . POLYPECTOMY    . THORACOTOMY/LOBECTOMY Right 07/15/2014   Procedure: RIGHT THORACOTOMY/RIGHT LOWER LOBE  LOBECTOMY;  Surgeon: Gaye Pollack, MD;  [facility];  Service: Thoracic;  Laterality: Right;  . TONSILLECTOMY  1967  . TOTAL HIP ARTHROPLASTY Left 09/26/2016   Procedure: TOTAL HIP ARTHROPLASTY ANTERIOR APPROACH;  Surgeon: Frederik Pear, MD;  Location: [place];  Service: Orthopedics;  Laterality: Left;  . TOTAL KNEE ARTHROPLASTY  06/04/2011   Procedure: TOTAL KNEE ARTHROPLASTY;  Surgeon: Rudean Haskell, MD;  Location: [place];  Service: Orthopedics;  Laterality: Right;  . TOTAL KNEE ARTHROPLASTY  10/15/2011   Procedure: TOTAL KNEE ARTHROPLASTY;  Surgeon: Rudean Haskell, MD;  Location: [place];  Service: Orthopedics;  Laterality: Left;  . [place]  . VENA CAVA FILTER PLACEMENT  2006  . VIDEO BRONCHOSCOPY WITH ENDOBRONCHIAL NAVIGATION N/A 06/02/2014   Procedure: VIDEO BRONCHOSCOPY WITH ENDOBRONCHIAL NAVIGATION;  Surgeon: Collene Gobble, MD;  Location: [place];  Service: Thoracic;  Laterality: N/A;    MEDICATIONS: Current Outpatient Medications on File Prior to Visit  Medication Sig Dispense Refill  . albuterol (PROVENTIL) (2.5 MG/3ML) 0.083% nebulizer solution Take 3 mLs (2.5 mg total) by nebulization every 6 (six) hours as needed for wheezing or shortness of breath. 15 mL 12  . Brexpiprazole (REXULTI) 0.5 MG TABS Take 1 tablet by mouth daily.    . Brexpiprazole (REXULTI) 2 MG TABS Take 2.5 mg by mouth daily.     . carbamazepine (TEGRETOL XR) 200 MG 12 hr tablet TAKE 1 TABLET BY MOUTH ONCE DAILY IN THE MORNING AND 2 ONCE DAILY IN THE EVENING 270 tablet 3  . COMBIVENT RESPIMAT 20-100 MCG/ACT AERS respimat INHALE 2 PUFFS BY MOUTH EVERY 6 HOURS AS NEEDED FOR SHORTNESS OF BREATH 4 g 0  . fenofibrate (TRICOR) 145 MG tablet Take 1 tablet by mouth once daily 60 tablet 0  . furosemide (LASIX) 20 MG tablet Take 1 tablet by mouth once daily  90 tablet 0  . Glycopyrrolate-Formoterol (BEVESPI AEROSPHERE) 9-4.8 MCG/ACT AERO Inhale 2 puffs into the lungs 2 (two) times daily. 10.7 g 3  . HYDROcodone-acetaminophen (NORCO) 10-325 MG tablet Take 1 tablet by mouth 3 (three) times daily. And prn    . hydroxychloroquine (PLAQUENIL) 200 MG tablet Take 1 tablet by mouth twice daily 90 tablet 0  . hydrOXYzine (VISTARIL) 25 MG capsule Take 50 mg by mouth 3 (three) times daily.     . metoprolol succinate (TOPROL-XL) 100 MG 24 hr tablet Take 1 tablet (100 mg total) by mouth daily. Take with or immediately following a meal. 90 tablet 1  . MYRBETRIQ 50 MG TB24 tablet Take 50 mg by mouth daily.   11  . Nebulizers (COMPRESSOR/NEBULIZER) MISC Dx COPD, use as needed 1 each 0  . predniSONE (DELTASONE) 50 MG tablet Please take 1 50mg  tablet daily for 5 days 5 tablet 0  . pregabalin (LYRICA) 100 MG capsule TAKE 1 CAPSULE BY MOUTH TWO TIMES DAILY. TAKE 1 CAPSULE IN THE MORNING AND TAKE 2 CAPSULES IN THE EVENING 270 capsule 3   No current facility-administered medications on file prior to visit.    ALLERGIES: Allergies  Allergen Reactions  . Imitrex [Sumatriptan Base] Nausea And Vomiting    Rapid heart beat.  [name] Ketorolac Tromethamine Nausea And Vomiting    Rapid heart beat.  . Stadol [Butorphanol Tartrate] Nausea And Vomiting  . Iodinated Diagnostic Agents Nausea Only and Palpitations    Can tolerate iodine The old IV dye - she is not allergic to the new IV dye  . Shellfish Allergy Nausea Only, Swelling and Palpitations    FAMILY HISTORY: Family History  Adopted: Yes  Problem Relation Age of Onset  . Anesthesia problems Neg Hx   . Hypotension Neg Hx   . Malignant hyperthermia Neg Hx   . Pseudochol deficiency Neg Hx     Objective:  *** General: No acute distress.  Patient appears well-groomed.   Head:  Normocephalic/atraumatic Eyes:  fundi examined but not visualized Neck: supple, no paraspinal tenderness, full range of motion Back: No  paraspinal tenderness Heart: regular rate and rhythm Lungs: Clear to auscultation bilaterally. Vascular: No carotid bruits. Neurological Exam: Mental status: alert and oriented to person, place, and time, recent and remote memory intact, fund of knowledge intact, attention and concentration intact, speech fluent and not dysarthric, language intact. Cranial nerves: CN I: not tested CN II: pupils equal, round and reactive to light, visual fields intact CN III, IV, VI:  full range of motion, no nystagmus, no ptosis CN V: facial sensation intact. CN VII: upper and lower face symmetric CN VIII: hearing intact CN IX, X: gag intact, uvula midline CN XI: sternocleidomastoid and trapezius muscles intact CN XII: tongue midline Bulk & Tone: normal, no fasciculations. Motor:  muscle strength 5/5 throughout Sensation:  Pinprick, temperature and vibratory sensation intact. Deep Tendon Reflexes:  2+ throughout,  toes downgoing.   Finger to nose testing:  Without dysmetria.   Heel to shin:  Without dysmetria.   Gait:  Normal station and stride.  Romberg negative.    Thank you for allowing me to take part in the care of this patient.  Metta Clines, DO  CC: ***

## 2020-10-24 ENCOUNTER — Other Ambulatory Visit: Payer: Self-pay | Admitting: Nurse Practitioner

## 2020-10-24 DIAGNOSIS — Z780 Asymptomatic menopausal state: Secondary | ICD-10-CM

## 2020-10-24 DIAGNOSIS — Z1231 Encounter for screening mammogram for malignant neoplasm of breast: Secondary | ICD-10-CM

## 2023-03-19 ENCOUNTER — Encounter: Payer: Self-pay | Admitting: Gastroenterology

## 2023-04-04 ENCOUNTER — Other Ambulatory Visit: Payer: Self-pay | Admitting: Physician Assistant

## 2023-04-04 DIAGNOSIS — Z0000 Encounter for general adult medical examination without abnormal findings: Secondary | ICD-10-CM

## 2023-09-16 ENCOUNTER — Other Ambulatory Visit: Payer: Self-pay

## 2023-09-16 ENCOUNTER — Encounter (HOSPITAL_COMMUNITY): Payer: Self-pay

## 2023-09-16 ENCOUNTER — Emergency Department (HOSPITAL_COMMUNITY)
Admission: EM | Admit: 2023-09-16 | Discharge: 2023-09-16 | Disposition: A | Discharge status: Home / Self Care | Attending: Emergency Medicine | Admitting: Emergency Medicine

## 2023-09-16 ENCOUNTER — Emergency Department (HOSPITAL_COMMUNITY)

## 2023-09-16 DIAGNOSIS — R079 Chest pain, unspecified: Secondary | ICD-10-CM

## 2023-09-16 DIAGNOSIS — Z720 Tobacco use: Secondary | ICD-10-CM | POA: Diagnosis not present

## 2023-09-16 DIAGNOSIS — Z7951 Long term (current) use of inhaled steroids: Secondary | ICD-10-CM | POA: Insufficient documentation

## 2023-09-16 DIAGNOSIS — R0789 Other chest pain: Secondary | ICD-10-CM | POA: Insufficient documentation

## 2023-09-16 DIAGNOSIS — J449 Chronic obstructive pulmonary disease, unspecified: Secondary | ICD-10-CM | POA: Insufficient documentation

## 2023-09-16 LAB — CBC
HCT: 39.8 % (ref 36.0–46.0)
Hemoglobin: 12.9 g/dL (ref 12.0–15.0)
MCH: 30.1 pg (ref 26.0–34.0)
MCHC: 32.4 g/dL (ref 30.0–36.0)
MCV: 93 fL (ref 80.0–100.0)
Platelets: 265 K/uL (ref 150–400)
RBC: 4.28 MIL/uL (ref 3.87–5.11)
RDW: 13.1 % (ref 11.5–15.5)
WBC: 6.5 K/uL (ref 4.0–10.5)
nRBC: 0 % (ref 0.0–0.2)

## 2023-09-16 LAB — BASIC METABOLIC PANEL WITH GFR
Anion gap: 10 (ref 5–15)
BUN: 7 mg/dL — ABNORMAL LOW (ref 8–23)
CO2: 24 mmol/L (ref 22–32)
Calcium: 8.6 mg/dL — ABNORMAL LOW (ref 8.9–10.3)
Chloride: 105 mmol/L (ref 98–111)
Creatinine, Ser: 0.53 mg/dL (ref 0.44–1.00)
GFR, Estimated: >60 mL/min (ref >60)
Glucose, Bld: 103 mg/dL — ABNORMAL HIGH (ref 70–99)
Potassium: 3.8 mmol/L (ref 3.5–5.1)
Sodium: 139 mmol/L (ref 135–145)

## 2023-09-16 LAB — HEPATIC FUNCTION PANEL
ALT: 10 U/L (ref 0–44)
AST: 21 U/L (ref 15–41)
Albumin: 3.2 g/dL — ABNORMAL LOW (ref 3.5–5.0)
Alkaline Phosphatase: 88 U/L (ref 38–126)
Bilirubin, Direct: <0.1 mg/dL (ref 0.0–0.2)
Total Bilirubin: 0.8 mg/dL (ref 0.0–1.2)
Total Protein: 5.8 g/dL — ABNORMAL LOW (ref 6.5–8.1)

## 2023-09-16 LAB — TROPONIN I (HIGH SENSITIVITY)
Troponin I (High Sensitivity): 4 ng/L (ref <18)
Troponin I (High Sensitivity): 4 ng/L (ref <18)

## 2023-09-16 LAB — LIPASE, BLOOD: Lipase: 23 U/L (ref 11–51)

## 2023-09-16 MED ORDER — NAPROXEN 250 MG PO TABS
500.0000 mg | ORAL_TABLET | Freq: Once | ORAL | Status: AC
  Administered 2023-09-16 (×2): 500 mg via ORAL
  Filled 2023-09-16: qty 2

## 2023-09-16 NOTE — ED Provider Triage Note (Signed)
 Emergency Medicine Provider Triage Evaluation Note  Breanna Dixon , a 77 y.o. female  was evaluated in triage.  Pt complains of SOB and chest heaviness yesterday that ersolved. She reports that the sensation started again around 0800. She also reports that she has some abdominal heaviness, unsure on pain. Denies any bowel changes. Denies any cough or cold symptoms. She has been decreasing her smoking. She reports that she was feeling nausea as well today.  Review of Systems  Positive:  Negative:   Physical Exam  BP (!) 167/67 (BP Location: Right Arm)   Pulse 80   Temp 99.5 F (37.5 C)   Resp 17   Ht 5' 8 (1.727 m)   Wt 53.5 kg   LMP 06/04/2011   SpO2 98%   BMI 17.94 kg/m  Gen:   Awake, no distress   Resp:  Normal effort  MSK:   Moves extremities without difficulty  Other:  CTAB. Does have some upper abdominal tenderness to palpation however difficult to assess in a chair in triage.   Medical Decision Making  Medically screening exam initiated at 4:38 PM.  Appropriate orders placed.  Breanna Dixon was informed that the remainder of the evaluation will be completed by another provider, this initial triage assessment does not replace that evaluation, and the importance of remaining in the ED until their evaluation is complete.  Will order Chest pain workup, added on hepatic function and lipase from order set. Will defer additional imaging until patient is moved to a room. She does not appear in any acute distress.    Bernis Ernst, [place] 09/16/23 1641

## 2023-09-16 NOTE — ED Triage Notes (Signed)
 Pt here for CP that started yesterday morning, worsening today. Non radiating. Worse with exertion. HX of COPD. EMS gave 324mg  of aspiring and 1 nitroglycerin. Axox4.

## 2023-09-16 NOTE — ED Provider Notes (Signed)
 [place] EMERGENCY DEPARTMENT AT [facility] Provider Note   CSN: [id number] Arrival date & time: 09/16/23  1454     Patient presents with: Chest Pain   Breanna Dixon is a 78 y.o. female.    Chest Pain  78 year old female history of COPD but no prior history of cardiac disease.  She presents to the hospital with a complaint of shortness of breath and a chest heaviness which has been going on for about a day and a half.  It has been constant, she is adamant that it is nonexertional and not associated with coughing fevers chills or swelling of her legs.  Nothing seems to make it better or worse, she has not been exposed to any new allergens or anybody who has been sick.  No nausea vomiting diarrhea.  She does have a history of COPD but does not use her inhaler hardly ever.    Prior to Admission medications   Medication Sig Start Date End Date Taking? Authorizing Provider  albuterol  (PROVENTIL ) (2.5 MG/3ML) 0.083% nebulizer solution Take 3 mLs (2.5 mg total) by nebulization every 6 (six) hours as needed for wheezing or shortness of breath. 09/02/17   Neysa Rama D, MD  Brexpiprazole  (REXULTI ) 0.5 MG TABS Take 1 tablet by mouth daily.    [provider]  Brexpiprazole  (REXULTI ) 2 MG TABS Take 2.5 mg by mouth daily.     [provider]  carbamazepine  (TEGRETOL  XR) 200 MG 12 hr tablet TAKE 1 TABLET BY MOUTH ONCE DAILY IN THE MORNING AND 2 ONCE DAILY IN THE EVENING 12/11/17   Fletcher, Jacob, MD  COMBIVENT  RESPIMAT 20-100 MCG/ACT AERS respimat INHALE 2 PUFFS BY MOUTH EVERY 6 HOURS AS NEEDED FOR SHORTNESS OF BREATH 09/22/19   Neysa Rama BIRCH, MD  fenofibrate  (TRICOR ) 145 MG tablet Take 1 tablet by mouth once daily 07/01/18   Fletcher, Jacob, MD  furosemide  (LASIX ) 20 MG tablet Take 1 tablet by mouth once daily 07/01/18   Fletcher, Jacob, MD  Glycopyrrolate -Formoterol  (BEVESPI  AEROSPHERE) 9-4.8 MCG/ACT AERO Inhale 2 puffs into the lungs 2 (two) times daily. 12/10/18    Neysa Rama BIRCH, MD  HYDROcodone -acetaminophen  (NORCO) 10-325 MG tablet Take 1 tablet by mouth 3 (three) times daily. And prn    [provider]  hydroxychloroquine  (PLAQUENIL ) 200 MG tablet Take 1 tablet by mouth twice daily 08/11/18   Fletcher, Jacob, MD  hydrOXYzine  (VISTARIL ) 25 MG capsule Take 50 mg by mouth 3 (three) times daily.  02/24/14   [provider]  metoprolol  succinate (TOPROL -XL) 100 MG 24 hr tablet Take 1 tablet (100 mg total) by mouth daily. Take with or immediately following a meal. 03/11/18   Fletcher, Jacob, MD  MYRBETRIQ 50 MG TB24 tablet Take 50 mg by mouth daily.  04/25/17   [provider]  Nebulizers (COMPRESSOR/NEBULIZER) MISC Dx COPD, use as needed 04/02/17   Nikki Rams, Aliene, MD  predniSONE  (DELTASONE ) 50 MG tablet Please take 1 50mg  tablet daily for 5 days 02/14/18   Genette Cadet, MD  pregabalin  (LYRICA ) 100 MG capsule TAKE 1 CAPSULE BY MOUTH TWO TIMES DAILY. TAKE 1 CAPSULE IN THE MORNING AND TAKE 2 CAPSULES IN THE EVENING 04/09/18   Fletcher, Jacob, MD    Allergies: Imitrex [sumatriptan base], Ketorolac  tromethamine , Stadol [butorphanol tartrate], Iodinated contrast media, and Shellfish allergy    Review of Systems  Cardiovascular:  Positive for chest pain.  All other systems reviewed and are negative.   Updated Vital Signs BP (!) 160/65  Pulse 76   Temp 99.5 F (37.5 C)   Resp 17   Ht 1.727 m (5' 8)   Wt 53.5 kg   LMP 06/04/2011   SpO2 100%   BMI 17.94 kg/m   Physical Exam Vitals and nursing note reviewed.  Constitutional:      General: She is not in acute distress.    Appearance: She is well-developed.  HENT:     Head: Normocephalic and atraumatic.     Mouth/Throat:     Pharynx: No oropharyngeal exudate.  Eyes:     General: No scleral icterus.       Right eye: No discharge.        Left eye: No discharge.     Conjunctiva/sclera: Conjunctivae normal.     Pupils: Pupils are equal, round, and reactive to light.   Neck:     Thyroid : No thyromegaly.     Vascular: No JVD.  Cardiovascular:     Rate and Rhythm: Normal rate and regular rhythm.     Heart sounds: Normal heart sounds. No murmur heard.    No friction rub. No gallop.  Pulmonary:     Effort: Pulmonary effort is normal. No respiratory distress.     Breath sounds: Normal breath sounds. No wheezing or rales.  Abdominal:     General: Bowel sounds are normal. There is no distension.     Palpations: Abdomen is soft. There is no mass.     Tenderness: There is no abdominal tenderness.  Musculoskeletal:        General: No tenderness. Normal range of motion.     Cervical back: Normal range of motion and neck supple.  Lymphadenopathy:     Cervical: No cervical adenopathy.  Skin:    General: Skin is warm and dry.     Findings: No erythema or rash.  Neurological:     Mental Status: She is alert.     Coordination: Coordination normal.  Psychiatric:        Behavior: Behavior normal.     (all labs ordered are listed, but only abnormal results are displayed) Labs Reviewed  BASIC METABOLIC PANEL WITH GFR - Abnormal; Notable for the following components:      Result Value   Glucose, Bld 103 (*)    BUN 7 (*)    Calcium  8.6 (*)    All other components within normal limits  HEPATIC FUNCTION PANEL - Abnormal; Notable for the following components:   Total Protein 5.8 (*)    Albumin  3.2 (*)    All other components within normal limits  CBC  LIPASE, BLOOD  URINALYSIS, ROUTINE W REFLEX MICROSCOPIC  TROPONIN I (HIGH SENSITIVITY)  TROPONIN I (HIGH SENSITIVITY)    EKG: EKG Interpretation Date/Time:  Monday September 16 2023 15:04:40 EDT Ventricular Rate:  77 PR Interval:  134 QRS Duration:  68 QT Interval:  404 QTC Calculation: 457 R Axis:   67  Text Interpretation: Normal sinus rhythm with sinus arrhythmia Normal ECG When compared with ECG of 26-Sep-2017 10:59, PREVIOUS ECG IS PRESENT Confirmed by Cleotilde Rogue ([id number]) on 09/16/2023 4:40:51  PM  Radiology: [facility] Chest 2 View Result Date: 09/16/2023 CLINICAL DATA:  Chest pain EXAM: CHEST - 2 VIEW COMPARISON:  11/11/2017 FINDINGS: Asymmetric elevation right hemidiaphragm, similar to prior. Right chest wall deformity/scarring similar to prior CT of 02/28/2018. Interstitial markings are diffusely coarsened with chronic features. Calcified nodal tissue is seen in the mediastinum and left hilum. Hyperexpansion suggests emphysema. Bones are diffusely demineralized. IMPRESSION:  Chronic interstitial coarsening without acute cardiopulmonary findings. Electronically Signed   By: Camellia Candle M.D.   On: 09/16/2023 16:50     Procedures   Medications Ordered in the ED  naproxen  (NAPROSYN ) tablet 500 mg (500 mg Oral Given 09/16/23 1844)                                    Medical Decision Making Amount and/or Complexity of Data Reviewed Labs: ordered. Radiology: ordered.  Risk Prescription drug management.    This patient presents to the ED for concern of shortness of breath and chest discomfort, this involves an extensive number of treatment options, and is a complaint that carries with it a high risk of complications and morbidity.  The differential diagnosis includes that her cardiac disease, coronary artery disease, arrhythmia, COPD, pneumonia, pneumothorax   Co morbidities / Chronic conditions that complicate the patient evaluation  Undiagnosed hypertension most likely as she states that she has been slightly high in the past.  COPD Continues to smoke   Additional history obtained:  Additional history obtained from EMR External records from outside source obtained and reviewed including medical record has had outpatient visits with urology as early as 2 years ago, no recent admissions to the hospital going back approximately 4 to 5 years, follows with pulmonology with Dr. Neysa   Lab Tests:  I Ordered, and personally interpreted labs.  The pertinent results include: CBC  metabolic panel and troponin are all unremarkable, no elevation in troponin   Imaging Studies ordered:  I ordered imaging studies including chest x-ray 2 view PA and lateral views I independently visualized and interpreted imaging which showed hyperexpanded lungs, no infiltrates, chronic interstitial coarsening I agree with the radiologist interpretation   Cardiac Monitoring: / EKG:  The patient was maintained on a cardiac monitor.  I personally viewed and interpreted the cardiac monitored which showed an underlying rhythm of: Normal sinus rhythm, no arrhythmia, no ischemia   Problem List / ED Course / Critical interventions / Medication management  Second troponin pending, anticipate discharge if negative, patient has been having symptoms for over 36 hours suggestive that this is not a an acute coronary syndrome, may be related to COPD or underlying anxiety I ordered medication including Naprosyn  Reevaluation of the patient after these medicines showed that the patient improved I have reviewed the patients home medicines and have made adjustments as needed   Social Determinants of Health:  COPD from tobacco use   Test / Admission - Considered:  Considered admission but the patient has a negative workup, 2 negative troponins and chest pain ongoing more than 24 hours, not exertional, unlikely to be coronary disease, referred to cardiology as an outpatient      Final diagnoses:  Chest pain, unspecified type    ED Discharge Orders     None          Cleotilde Rogue, MD 09/16/23 [phone]

## 2023-09-16 NOTE — Discharge Instructions (Addendum)
 Thankfully your testing looked normal, there was no signs of heart attack, no signs of pancreatitis, your lung exam was consistent with having some COPD so I would recommend that you continue to take your albuterol  exactly as prescribed.  In the next 24 hours please take it every 6 hours, you should see your doctor within 2 or 3 days for a recheck but come back to the ER for severe worsening symptoms  You may also want a follow-up with a cardiologist, please see the phone number above

## 2024-01-13 ENCOUNTER — Other Ambulatory Visit: Payer: Self-pay | Admitting: Family

## 2024-01-13 ENCOUNTER — Ambulatory Visit
Admission: RE | Admit: 2024-01-13 | Discharge: 2024-01-13 | Disposition: A | Source: Ambulatory Visit | Attending: Family | Admitting: Family

## 2024-01-13 DIAGNOSIS — R52 Pain, unspecified: Secondary | ICD-10-CM

## 2024-03-11 ENCOUNTER — Ambulatory Visit

## 2024-03-18 ENCOUNTER — Ambulatory Visit: Admitting: Cardiovascular Disease
